# Patient Record
Sex: Female | Born: 1935 | Race: White | Hispanic: No | Smoking: Never smoker
Health system: Southern US, Community
[De-identification: ages and names within clinical notes are randomized; demographics above are authoritative.]

## PROBLEM LIST (undated history)

## (undated) DIAGNOSIS — E785 Hyperlipidemia, unspecified: Secondary | ICD-10-CM

## (undated) DIAGNOSIS — Z9889 Other specified postprocedural states: Secondary | ICD-10-CM

## (undated) DIAGNOSIS — F419 Anxiety disorder, unspecified: Secondary | ICD-10-CM

## (undated) DIAGNOSIS — A048 Other specified bacterial intestinal infections: Secondary | ICD-10-CM

## (undated) DIAGNOSIS — K5790 Diverticulosis of intestine, part unspecified, without perforation or abscess without bleeding: Secondary | ICD-10-CM

## (undated) DIAGNOSIS — I340 Nonrheumatic mitral (valve) insufficiency: Secondary | ICD-10-CM

## (undated) DIAGNOSIS — R413 Other amnesia: Secondary | ICD-10-CM

## (undated) DIAGNOSIS — I10 Essential (primary) hypertension: Secondary | ICD-10-CM

## (undated) DIAGNOSIS — I639 Cerebral infarction, unspecified: Secondary | ICD-10-CM

## (undated) DIAGNOSIS — D696 Thrombocytopenia, unspecified: Secondary | ICD-10-CM

## (undated) DIAGNOSIS — F0390 Unspecified dementia without behavioral disturbance: Secondary | ICD-10-CM

## (undated) DIAGNOSIS — F329 Major depressive disorder, single episode, unspecified: Secondary | ICD-10-CM

## (undated) DIAGNOSIS — R55 Syncope and collapse: Secondary | ICD-10-CM

## (undated) DIAGNOSIS — K219 Gastro-esophageal reflux disease without esophagitis: Secondary | ICD-10-CM

## (undated) DIAGNOSIS — F32A Depression, unspecified: Secondary | ICD-10-CM

## (undated) DIAGNOSIS — E041 Nontoxic single thyroid nodule: Secondary | ICD-10-CM

## (undated) HISTORY — DX: Thrombocytopenia, unspecified: D69.6

## (undated) HISTORY — DX: Diverticulosis of intestine, part unspecified, without perforation or abscess without bleeding: K57.90

## (undated) HISTORY — DX: Syncope and collapse: R55

## (undated) HISTORY — DX: Nonrheumatic mitral (valve) insufficiency: I34.0

## (undated) HISTORY — DX: Other amnesia: R41.3

## (undated) HISTORY — DX: Other specified bacterial intestinal infections: A04.8

## (undated) HISTORY — DX: Gastro-esophageal reflux disease without esophagitis: K21.9

## (undated) HISTORY — DX: Hyperlipidemia, unspecified: E78.5

## (undated) HISTORY — DX: Anxiety disorder, unspecified: F41.9

## (undated) HISTORY — DX: Major depressive disorder, single episode, unspecified: F32.9

## (undated) HISTORY — DX: Essential (primary) hypertension: I10

## (undated) HISTORY — DX: Nontoxic single thyroid nodule: E04.1

## (undated) HISTORY — DX: Cerebral infarction, unspecified: I63.9

## (undated) HISTORY — PX: LASIK: SHX215

## (undated) HISTORY — DX: Depression, unspecified: F32.A

---

## 2006-02-18 ENCOUNTER — Ambulatory Visit: Payer: Self-pay | Admitting: Internal Medicine

## 2006-03-04 ENCOUNTER — Ambulatory Visit: Payer: Self-pay | Admitting: Internal Medicine

## 2006-04-16 ENCOUNTER — Ambulatory Visit: Payer: Self-pay | Admitting: Internal Medicine

## 2006-08-08 ENCOUNTER — Ambulatory Visit: Payer: Self-pay | Admitting: Internal Medicine

## 2007-02-05 ENCOUNTER — Ambulatory Visit: Payer: Self-pay | Admitting: Internal Medicine

## 2007-02-05 DIAGNOSIS — F411 Generalized anxiety disorder: Secondary | ICD-10-CM | POA: Insufficient documentation

## 2007-02-05 DIAGNOSIS — R413 Other amnesia: Secondary | ICD-10-CM | POA: Insufficient documentation

## 2007-02-05 DIAGNOSIS — E785 Hyperlipidemia, unspecified: Secondary | ICD-10-CM | POA: Insufficient documentation

## 2007-02-05 DIAGNOSIS — M810 Age-related osteoporosis without current pathological fracture: Secondary | ICD-10-CM | POA: Insufficient documentation

## 2007-02-05 DIAGNOSIS — I10 Essential (primary) hypertension: Secondary | ICD-10-CM | POA: Insufficient documentation

## 2007-02-05 DIAGNOSIS — F418 Other specified anxiety disorders: Secondary | ICD-10-CM | POA: Insufficient documentation

## 2007-02-05 DIAGNOSIS — K8020 Calculus of gallbladder without cholecystitis without obstruction: Secondary | ICD-10-CM | POA: Insufficient documentation

## 2007-02-05 DIAGNOSIS — Z8679 Personal history of other diseases of the circulatory system: Secondary | ICD-10-CM | POA: Insufficient documentation

## 2007-02-05 DIAGNOSIS — K219 Gastro-esophageal reflux disease without esophagitis: Secondary | ICD-10-CM | POA: Insufficient documentation

## 2007-03-06 ENCOUNTER — Encounter: Payer: Self-pay | Admitting: Internal Medicine

## 2007-05-07 ENCOUNTER — Ambulatory Visit: Payer: Self-pay | Admitting: Internal Medicine

## 2007-05-07 DIAGNOSIS — M79609 Pain in unspecified limb: Secondary | ICD-10-CM | POA: Insufficient documentation

## 2007-05-07 DIAGNOSIS — M109 Gout, unspecified: Secondary | ICD-10-CM | POA: Insufficient documentation

## 2007-07-24 ENCOUNTER — Ambulatory Visit: Payer: Self-pay | Admitting: Internal Medicine

## 2007-08-05 ENCOUNTER — Telehealth: Payer: Self-pay | Admitting: Internal Medicine

## 2007-08-28 ENCOUNTER — Encounter: Payer: Self-pay | Admitting: Internal Medicine

## 2007-09-14 ENCOUNTER — Encounter: Payer: Self-pay | Admitting: Internal Medicine

## 2007-10-22 ENCOUNTER — Ambulatory Visit: Payer: Self-pay | Admitting: Internal Medicine

## 2007-10-22 DIAGNOSIS — R269 Unspecified abnormalities of gait and mobility: Secondary | ICD-10-CM | POA: Insufficient documentation

## 2007-11-08 ENCOUNTER — Encounter: Payer: Self-pay | Admitting: Internal Medicine

## 2008-01-15 HISTORY — PX: CHOLECYSTECTOMY: SHX55

## 2008-02-12 ENCOUNTER — Encounter: Payer: Self-pay | Admitting: Internal Medicine

## 2008-03-03 ENCOUNTER — Ambulatory Visit: Payer: Self-pay | Admitting: Internal Medicine

## 2008-03-03 DIAGNOSIS — E559 Vitamin D deficiency, unspecified: Secondary | ICD-10-CM | POA: Insufficient documentation

## 2008-03-03 LAB — CONVERTED CEMR LAB
Uric Acid, Serum: 8.7 mg/dL — ABNORMAL HIGH (ref 2.4–7.0)
Vit D, 25-Hydroxy: 26 ng/mL — ABNORMAL LOW (ref 30–89)

## 2008-03-07 LAB — CONVERTED CEMR LAB
BUN: 24 mg/dL — ABNORMAL HIGH (ref 6–23)
Basophils Absolute: 0.1 10*3/uL (ref 0.0–0.1)
Basophils Relative: 1 % (ref 0.0–3.0)
Bilirubin Urine: NEGATIVE
CO2: 30 meq/L (ref 19–32)
Calcium: 10 mg/dL (ref 8.4–10.5)
Chloride: 104 meq/L (ref 96–112)
Cholesterol: 160 mg/dL (ref 0–200)
Creatinine, Ser: 1 mg/dL (ref 0.4–1.2)
Crystals: NEGATIVE
Eosinophils Absolute: 0 10*3/uL (ref 0.0–0.7)
Eosinophils Relative: 0.4 % (ref 0.0–5.0)
GFR calc Af Amer: 70 mL/min
GFR calc non Af Amer: 58 mL/min
Glucose, Bld: 130 mg/dL — ABNORMAL HIGH (ref 70–99)
HCT: 42.8 % (ref 36.0–46.0)
HDL: 35.4 mg/dL — ABNORMAL LOW (ref >39.0)
Hemoglobin: 15.1 g/dL — ABNORMAL HIGH (ref 12.0–15.0)
Ketones, ur: NEGATIVE mg/dL
LDL Cholesterol: 100 mg/dL — ABNORMAL HIGH (ref 0–99)
Lymphocytes Relative: 21.6 % (ref 12.0–46.0)
MCHC: 35.2 g/dL (ref 30.0–36.0)
MCV: 91.7 fL (ref 78.0–100.0)
Monocytes Absolute: 0.4 10*3/uL (ref 0.1–1.0)
Monocytes Relative: 4.9 % (ref 3.0–12.0)
Mucus, UA: NEGATIVE
Neutro Abs: 6.2 10*3/uL (ref 1.4–7.7)
Neutrophils Relative %: 72.1 % (ref 43.0–77.0)
Nitrite: NEGATIVE
Platelets: 123 10*3/uL — ABNORMAL LOW (ref 150–400)
Potassium: 3.8 meq/L (ref 3.5–5.1)
RBC: 4.67 M/uL (ref 3.87–5.11)
RDW: 11.7 % (ref 11.5–14.6)
Sed Rate: 12 mm/hr (ref 0–22)
Sodium: 143 meq/L (ref 135–145)
Specific Gravity, Urine: >=1.03 (ref 1.000–1.03)
TSH: 1.85 microintl units/mL (ref 0.35–5.50)
Total CHOL/HDL Ratio: 4.5
Total Protein, Urine: NEGATIVE mg/dL
Triglycerides: 122 mg/dL (ref 0–149)
Urine Glucose: NEGATIVE mg/dL
Urobilinogen, UA: 0.2 (ref 0.0–1.0)
VLDL: 24 mg/dL (ref 0–40)
Vitamin B-12: 469 pg/mL (ref 211–911)
WBC: 8.6 10*3/uL (ref 4.5–10.5)
pH: 5 (ref 5.0–8.0)

## 2008-03-18 ENCOUNTER — Encounter: Payer: Self-pay | Admitting: Internal Medicine

## 2008-07-23 ENCOUNTER — Encounter: Payer: Self-pay | Admitting: Internal Medicine

## 2008-08-01 ENCOUNTER — Ambulatory Visit: Payer: Self-pay | Admitting: Internal Medicine

## 2008-08-01 DIAGNOSIS — R079 Chest pain, unspecified: Secondary | ICD-10-CM | POA: Insufficient documentation

## 2008-09-13 ENCOUNTER — Ambulatory Visit: Payer: Self-pay | Admitting: Internal Medicine

## 2008-09-13 LAB — CONVERTED CEMR LAB
ALT: 55 units/L — ABNORMAL HIGH (ref 0–35)
AST: 35 units/L (ref 0–37)
Albumin: 4.1 g/dL (ref 3.5–5.2)
Alkaline Phosphatase: 71 units/L (ref 39–117)
Basophils Absolute: 0.1 10*3/uL (ref 0.0–0.1)
Basophils Relative: 0.7 % (ref 0.0–3.0)
Bilirubin Urine: NEGATIVE
Bilirubin, Direct: 0.1 mg/dL (ref 0.0–0.3)
Eosinophils Absolute: 0.1 10*3/uL (ref 0.0–0.7)
Eosinophils Relative: 1 % (ref 0.0–5.0)
HCT: 41.1 % (ref 36.0–46.0)
Hemoglobin, Urine: NEGATIVE
Hemoglobin: 14.4 g/dL (ref 12.0–15.0)
Ketones, ur: NEGATIVE mg/dL
Leukocytes, UA: NEGATIVE
Lymphocytes Relative: 23.1 % (ref 12.0–46.0)
Lymphs Abs: 1.7 10*3/uL (ref 0.7–4.0)
MCHC: 35.1 g/dL (ref 30.0–36.0)
MCV: 91.5 fL (ref 78.0–100.0)
Monocytes Absolute: 0.5 10*3/uL (ref 0.1–1.0)
Monocytes Relative: 6.7 % (ref 3.0–12.0)
Neutro Abs: 4.9 10*3/uL (ref 1.4–7.7)
Neutrophils Relative %: 68.5 % (ref 43.0–77.0)
Nitrite: NEGATIVE
Platelets: 120 10*3/uL — ABNORMAL LOW (ref 150.0–400.0)
RBC: 4.49 M/uL (ref 3.87–5.11)
RDW: 12.2 % (ref 11.5–14.6)
Specific Gravity, Urine: 1.025 (ref 1.000–1.030)
Total Bilirubin: 1 mg/dL (ref 0.3–1.2)
Total Protein, Urine: NEGATIVE mg/dL
Total Protein: 7.6 g/dL (ref 6.0–8.3)
Urine Glucose: NEGATIVE mg/dL
Urobilinogen, UA: 0.2 (ref 0.0–1.0)
WBC: 7.3 10*3/uL (ref 4.5–10.5)
pH: 5.5 (ref 5.0–8.0)

## 2008-09-27 ENCOUNTER — Encounter: Payer: Self-pay | Admitting: Internal Medicine

## 2008-10-26 ENCOUNTER — Ambulatory Visit: Payer: Self-pay | Admitting: Gastroenterology

## 2008-10-26 ENCOUNTER — Encounter (INDEPENDENT_AMBULATORY_CARE_PROVIDER_SITE_OTHER): Payer: Self-pay | Admitting: Surgery

## 2008-10-26 ENCOUNTER — Inpatient Hospital Stay (HOSPITAL_COMMUNITY): Admission: AD | Admit: 2008-10-26 | Discharge: 2008-10-30 | Payer: Self-pay | Admitting: Surgery

## 2008-12-06 ENCOUNTER — Encounter: Payer: Self-pay | Admitting: Internal Medicine

## 2008-12-12 ENCOUNTER — Ambulatory Visit: Payer: Self-pay | Admitting: Internal Medicine

## 2008-12-12 LAB — CONVERTED CEMR LAB
ALT: 35 units/L (ref 0–35)
AST: 30 units/L (ref 0–37)
Albumin: 4.1 g/dL (ref 3.5–5.2)
Alkaline Phosphatase: 65 units/L (ref 39–117)
BUN: 16 mg/dL (ref 6–23)
Bilirubin, Direct: 0.2 mg/dL (ref 0.0–0.3)
CO2: 29 meq/L (ref 19–32)
Calcium: 9.6 mg/dL (ref 8.4–10.5)
Chloride: 105 meq/L (ref 96–112)
Cholesterol: 178 mg/dL (ref 0–200)
Creatinine, Ser: 1 mg/dL (ref 0.4–1.2)
GFR calc non Af Amer: 57.87 mL/min (ref >60)
Glucose, Bld: 162 mg/dL — ABNORMAL HIGH (ref 70–99)
HDL: 34.8 mg/dL — ABNORMAL LOW (ref >39.00)
LDL Cholesterol: 114 mg/dL — ABNORMAL HIGH (ref 0–99)
Potassium: 4.2 meq/L (ref 3.5–5.1)
Sodium: 141 meq/L (ref 135–145)
Total Bilirubin: 1 mg/dL (ref 0.3–1.2)
Total CHOL/HDL Ratio: 5
Total Protein: 7.1 g/dL (ref 6.0–8.3)
Triglycerides: 146 mg/dL (ref 0.0–149.0)
Uric Acid, Serum: 8.1 mg/dL — ABNORMAL HIGH (ref 2.4–7.0)
VLDL: 29.2 mg/dL (ref 0.0–40.0)

## 2009-01-14 DIAGNOSIS — A048 Other specified bacterial intestinal infections: Secondary | ICD-10-CM

## 2009-01-14 HISTORY — DX: Other specified bacterial intestinal infections: A04.8

## 2009-03-17 ENCOUNTER — Ambulatory Visit: Payer: Self-pay | Admitting: Internal Medicine

## 2009-03-17 DIAGNOSIS — R209 Unspecified disturbances of skin sensation: Secondary | ICD-10-CM | POA: Insufficient documentation

## 2009-03-17 DIAGNOSIS — A048 Other specified bacterial intestinal infections: Secondary | ICD-10-CM | POA: Insufficient documentation

## 2009-03-17 LAB — CONVERTED CEMR LAB: Vit D, 25-Hydroxy: 27 ng/mL — ABNORMAL LOW (ref 30–89)

## 2009-03-20 LAB — CONVERTED CEMR LAB
ALT: 29 units/L (ref 0–35)
AST: 23 units/L (ref 0–37)
Albumin: 4 g/dL (ref 3.5–5.2)
Alkaline Phosphatase: 58 units/L (ref 39–117)
BUN: 11 mg/dL (ref 6–23)
Basophils Absolute: 0 10*3/uL (ref 0.0–0.1)
Basophils Relative: 0.7 % (ref 0.0–3.0)
Bilirubin Urine: NEGATIVE
Bilirubin, Direct: 0.2 mg/dL (ref 0.0–0.3)
CO2: 30 meq/L (ref 19–32)
Calcium: 9.5 mg/dL (ref 8.4–10.5)
Chloride: 108 meq/L (ref 96–112)
Creatinine, Ser: 0.8 mg/dL (ref 0.4–1.2)
Eosinophils Absolute: 0.1 10*3/uL (ref 0.0–0.7)
Eosinophils Relative: 1.5 % (ref 0.0–5.0)
GFR calc non Af Amer: 74.81 mL/min (ref >60)
Glucose, Bld: 129 mg/dL — ABNORMAL HIGH (ref 70–99)
H Pylori IgG: POSITIVE
HCT: 40.9 % (ref 36.0–46.0)
Hemoglobin: 13.5 g/dL (ref 12.0–15.0)
Ketones, ur: NEGATIVE mg/dL
Lymphocytes Relative: 22 % (ref 12.0–46.0)
Lymphs Abs: 1.2 10*3/uL (ref 0.7–4.0)
MCHC: 33.1 g/dL (ref 30.0–36.0)
MCV: 94.9 fL (ref 78.0–100.0)
Monocytes Absolute: 0.4 10*3/uL (ref 0.1–1.0)
Monocytes Relative: 7.2 % (ref 3.0–12.0)
Neutro Abs: 3.7 10*3/uL (ref 1.4–7.7)
Neutrophils Relative %: 68.6 % (ref 43.0–77.0)
Nitrite: NEGATIVE
Platelets: 94 10*3/uL — ABNORMAL LOW (ref 150.0–400.0)
Potassium: 4.1 meq/L (ref 3.5–5.1)
RBC: 4.31 M/uL (ref 3.87–5.11)
RDW: 12.2 % (ref 11.5–14.6)
Sodium: 142 meq/L (ref 135–145)
Specific Gravity, Urine: 1.025 (ref 1.000–1.030)
TSH: 1.72 microintl units/mL (ref 0.35–5.50)
Total Bilirubin: 0.7 mg/dL (ref 0.3–1.2)
Total Protein, Urine: NEGATIVE mg/dL
Total Protein: 7.2 g/dL (ref 6.0–8.3)
Urine Glucose: NEGATIVE mg/dL
Urobilinogen, UA: 2 (ref 0.0–1.0)
Vitamin B-12: 377 pg/mL (ref 211–911)
WBC: 5.4 10*3/uL (ref 4.5–10.5)
pH: 7 (ref 5.0–8.0)

## 2009-06-26 ENCOUNTER — Ambulatory Visit: Payer: Self-pay | Admitting: Internal Medicine

## 2009-06-27 ENCOUNTER — Ambulatory Visit: Payer: Self-pay | Admitting: Internal Medicine

## 2009-06-28 LAB — CONVERTED CEMR LAB
ALT: 17 units/L (ref 0–35)
AST: 17 units/L (ref 0–37)
Albumin: 4.1 g/dL (ref 3.5–5.2)
Alkaline Phosphatase: 60 units/L (ref 39–117)
BUN: 21 mg/dL (ref 6–23)
Basophils Absolute: 0 10*3/uL (ref 0.0–0.1)
Basophils Relative: 0.6 % (ref 0.0–3.0)
Bilirubin, Direct: 0.1 mg/dL (ref 0.0–0.3)
CO2: 30 meq/L (ref 19–32)
Calcium: 9.4 mg/dL (ref 8.4–10.5)
Chloride: 106 meq/L (ref 96–112)
Creatinine, Ser: 0.9 mg/dL (ref 0.4–1.2)
Eosinophils Absolute: 0.1 10*3/uL (ref 0.0–0.7)
Eosinophils Relative: 0.9 % (ref 0.0–5.0)
GFR calc non Af Amer: 64.42 mL/min (ref >60)
Glucose, Bld: 102 mg/dL — ABNORMAL HIGH (ref 70–99)
HCT: 41.5 % (ref 36.0–46.0)
Hemoglobin: 14.4 g/dL (ref 12.0–15.0)
Hgb A1c MFr Bld: 5.6 % (ref 4.6–6.5)
Lymphocytes Relative: 17.8 % (ref 12.0–46.0)
Lymphs Abs: 1.4 10*3/uL (ref 0.7–4.0)
MCHC: 34.8 g/dL (ref 30.0–36.0)
MCV: 92.4 fL (ref 78.0–100.0)
Monocytes Absolute: 0.4 10*3/uL (ref 0.1–1.0)
Monocytes Relative: 5.4 % (ref 3.0–12.0)
Neutro Abs: 6.1 10*3/uL (ref 1.4–7.7)
Neutrophils Relative %: 75.3 % (ref 43.0–77.0)
Platelets: 105 10*3/uL — ABNORMAL LOW (ref 150.0–400.0)
Potassium: 4.5 meq/L (ref 3.5–5.1)
RBC: 4.49 M/uL (ref 3.87–5.11)
RDW: 12.4 % (ref 11.5–14.6)
Sodium: 143 meq/L (ref 135–145)
TSH: 1.6 microintl units/mL (ref 0.35–5.50)
Total Bilirubin: 0.6 mg/dL (ref 0.3–1.2)
Total Protein: 7.2 g/dL (ref 6.0–8.3)
Vitamin B-12: 299 pg/mL (ref 211–911)
WBC: 8.1 10*3/uL (ref 4.5–10.5)

## 2009-09-27 ENCOUNTER — Ambulatory Visit: Payer: Self-pay | Admitting: Internal Medicine

## 2009-11-14 ENCOUNTER — Encounter (INDEPENDENT_AMBULATORY_CARE_PROVIDER_SITE_OTHER): Payer: Self-pay | Admitting: *Deleted

## 2009-11-17 ENCOUNTER — Encounter (INDEPENDENT_AMBULATORY_CARE_PROVIDER_SITE_OTHER): Payer: Self-pay | Admitting: *Deleted

## 2009-11-30 ENCOUNTER — Encounter (INDEPENDENT_AMBULATORY_CARE_PROVIDER_SITE_OTHER): Payer: Self-pay | Admitting: *Deleted

## 2009-12-01 ENCOUNTER — Ambulatory Visit: Payer: Self-pay | Admitting: Gastroenterology

## 2009-12-11 ENCOUNTER — Ambulatory Visit: Payer: Self-pay | Admitting: Gastroenterology

## 2009-12-11 LAB — HM COLONOSCOPY

## 2009-12-12 ENCOUNTER — Telehealth (INDEPENDENT_AMBULATORY_CARE_PROVIDER_SITE_OTHER): Payer: Self-pay | Admitting: *Deleted

## 2009-12-12 DIAGNOSIS — K649 Unspecified hemorrhoids: Secondary | ICD-10-CM | POA: Insufficient documentation

## 2010-01-02 ENCOUNTER — Encounter: Payer: Self-pay | Admitting: Internal Medicine

## 2010-01-02 ENCOUNTER — Telehealth: Payer: Self-pay | Admitting: Internal Medicine

## 2010-01-02 ENCOUNTER — Ambulatory Visit: Payer: Self-pay | Admitting: Internal Medicine

## 2010-01-02 DIAGNOSIS — D696 Thrombocytopenia, unspecified: Secondary | ICD-10-CM | POA: Insufficient documentation

## 2010-01-04 LAB — CONVERTED CEMR LAB
ALT: 22 units/L (ref 0–35)
AST: 24 units/L (ref 0–37)
Albumin: 4.1 g/dL (ref 3.5–5.2)
Alkaline Phosphatase: 65 units/L (ref 39–117)
BUN: 14 mg/dL (ref 6–23)
Basophils Absolute: 0 10*3/uL (ref 0.0–0.1)
Basophils Relative: 0.7 % (ref 0.0–3.0)
Bilirubin Urine: NEGATIVE
Bilirubin, Direct: 0.1 mg/dL (ref 0.0–0.3)
CO2: 28 meq/L (ref 19–32)
Calcium: 9.3 mg/dL (ref 8.4–10.5)
Chloride: 107 meq/L (ref 96–112)
Cholesterol: 172 mg/dL (ref 0–200)
Creatinine, Ser: 0.9 mg/dL (ref 0.4–1.2)
Eosinophils Absolute: 0 10*3/uL (ref 0.0–0.7)
Eosinophils Relative: 0.6 % (ref 0.0–5.0)
Folate: 9.4 ng/mL
GFR calc non Af Amer: 63.52 mL/min (ref >60.00)
Glucose, Bld: 98 mg/dL (ref 70–99)
HCT: 40.4 % (ref 36.0–46.0)
HDL: 36.9 mg/dL — ABNORMAL LOW (ref >39.00)
Hemoglobin: 14 g/dL (ref 12.0–15.0)
Ketones, ur: NEGATIVE mg/dL
LDL Cholesterol: 109 mg/dL — ABNORMAL HIGH (ref 0–99)
Lymphocytes Relative: 25.1 % (ref 12.0–46.0)
Lymphs Abs: 1.7 10*3/uL (ref 0.7–4.0)
MCHC: 34.7 g/dL (ref 30.0–36.0)
MCV: 92.1 fL (ref 78.0–100.0)
Monocytes Absolute: 0.4 10*3/uL (ref 0.1–1.0)
Monocytes Relative: 5.3 % (ref 3.0–12.0)
Neutro Abs: 4.6 10*3/uL (ref 1.4–7.7)
Neutrophils Relative %: 68.3 % (ref 43.0–77.0)
Nitrite: NEGATIVE
Platelets: 44 10*3/uL — CL (ref 150.0–400.0)
Potassium: 4.2 meq/L (ref 3.5–5.1)
RBC: 4.39 M/uL (ref 3.87–5.11)
RDW: 12.3 % (ref 11.5–14.6)
Sodium: 142 meq/L (ref 135–145)
Specific Gravity, Urine: 1.01 (ref 1.000–1.030)
TSH: 1.88 microintl units/mL (ref 0.35–5.50)
Total Bilirubin: 0.7 mg/dL (ref 0.3–1.2)
Total CHOL/HDL Ratio: 5
Total Protein, Urine: NEGATIVE mg/dL
Total Protein: 7 g/dL (ref 6.0–8.3)
Triglycerides: 131 mg/dL (ref 0.0–149.0)
Urine Glucose: NEGATIVE mg/dL
Urobilinogen, UA: 0.2 (ref 0.0–1.0)
VLDL: 26.2 mg/dL (ref 0.0–40.0)
Vitamin B-12: 362 pg/mL (ref 211–911)
WBC: 6.7 10*3/uL (ref 4.5–10.5)
pH: 6 (ref 5.0–8.0)

## 2010-01-05 LAB — CONVERTED CEMR LAB: Vit D, 25-Hydroxy: 29 ng/mL — ABNORMAL LOW (ref 30–89)

## 2010-02-13 NOTE — Letter (Signed)
Summary: [facility] Instructions  [facility]  [address] [place], [place] [address]   Phone: [phone]  Fax: [phone]       Alice Wong    July 24, 1934    MRN: [id number]        Procedure Day Dorna Bloom:  Duanne Limerick  12/11/09     Arrival Time:  8:30AM     Procedure Time:  9:30AM     Location of Procedure:                    _ X_  [facility] (4th Floor)   PREPARATION FOR COLONOSCOPY WITH MOVIPREP   Starting 5 days prior to your procedure 12/06/09 do not eat nuts, seeds, popcorn, corn, beans, peas,  salads, or any raw vegetables.  Do not take any fiber supplements (e.g. Metamucil, Citrucel, and Benefiber).  THE DAY BEFORE YOUR PROCEDURE         DATE: 12/10/09   DAY: SUNDAY  1.  Drink clear liquids the entire day-NO SOLID FOOD  2.  Do not drink anything colored red or purple.  Avoid juices with pulp.  No orange juice.  3.  Drink at least 64 oz. (8 glasses) of fluid/clear liquids during the day to prevent dehydration and help the prep work efficiently.  CLEAR LIQUIDS INCLUDE: Water Jello Ice Popsicles Tea (sugar ok, no milk/cream) Powdered fruit flavored drinks Coffee (sugar ok, no milk/cream) Gatorade Juice: apple, white grape, white cranberry  Lemonade Clear bullion, consomm, broth Carbonated beverages (any kind) Strained chicken noodle soup Hard Candy                             4.  In the morning, mix first dose of MoviPrep solution:    Empty 1 Pouch A and 1 Pouch B into the disposable container    Add lukewarm drinking water to the top line of the container. Mix to dissolve    Refrigerate (mixed solution should be used within 24 hrs)  5.  Begin drinking the prep at 5:00 p.m. The MoviPrep container is divided by 4 marks.   Every 15 minutes drink the solution down to the next mark (approximately 8 oz) until the full liter is complete.   6.  Follow completed prep with 16 oz of clear liquid of your choice (Nothing red or purple).   Continue to drink clear liquids until bedtime.  7.  Before going to bed, mix second dose of MoviPrep solution:    Empty 1 Pouch A and 1 Pouch B into the disposable container    Add lukewarm drinking water to the top line of the container. Mix to dissolve    Refrigerate  THE DAY OF YOUR PROCEDURE      DATE: 12/11/09  DAY: MONDAY  Beginning at 4:30AM (5 hours before procedure):         1. Every 15 minutes, drink the solution down to the next mark (approx 8 oz) until the full liter is complete.  2. Follow completed prep with 16 oz. of clear liquid of your choice.    3. You may drink clear liquids until 7:30AM (2 HOURS BEFORE PROCEDURE).   MEDICATION INSTRUCTIONS  Unless otherwise instructed, you should take regular prescription medications with a small sip of water   as early as possible the morning of your procedure.   Additional medication instructions: Hold Furosemide the morning of procedure.  OTHER INSTRUCTIONS  You will need a responsible adult at least 74 years of age to accompany you and drive you home.   This person must remain in the waiting room during your procedure.  Wear loose fitting clothing that is easily removed.  Leave jewelry and other valuables at home.  However, you may wish to bring a book to read or  an iPod/MP3 player to listen to music as you wait for your procedure to start.  Remove all body piercing jewelry and leave at home.  Total time from sign-in until discharge is approximately 2-3 hours.  You should go home directly after your procedure and rest.  You can resume normal activities the  day after your procedure.  The day of your procedure you should not:   Drive   Make legal decisions   Operate machinery   Drink alcohol   Return to work  You will receive specific instructions about eating, activities and medications before you leave.    The above instructions have been reviewed and explained to me by   Wyona Almas RN  December 01, 2009 9:17 AM     I fully understand and can verbalize these instructions _____________________________ Date _________

## 2010-02-13 NOTE — Assessment & Plan Note (Signed)
Summary: 3 MO ROV /NWS #   Vital Signs:  Patient profile:   76 year old female Weight:      174 pounds O2 Sat:      99 % Temp:     97.8 degrees F oral Pulse rate:   57 / minute BP sitting:   160 / 80  (left arm)  Vitals Entered By: Tora Perches (March 17, 2009 7:55 AM) CC: f/u Is Patient Diabetic? No   CC:  f/u.  History of Present Illness: The patient presents for a follow up of hypertension, OA, gout, GS   Preventive Screening-Counseling & Management  Alcohol-Tobacco     Smoking Status: never  Current Medications (verified): 1)  Fluoxetine Hcl 20 Mg  Tabs (Fluoxetine Hcl) .... Take 2 Tab By Mouth Daily 2)  Enalapril Maleate 20 Mg  Tabs (Enalapril Maleate) .... Take 1 Tab By Mouth Daily 3)  Coreg 12.5 Mg  Tabs (Carvedilol) .... Take 1 Tab By Mouth Twice A Day 4)  Aspir-Low 81 Mg Tbec (Aspirin) .Marland Kitchen.. 1 Bid  After Meal 5)  Vitamin D3 1000 Unit  Tabs (Cholecalciferol) .Marland Kitchen.. 1 Qd 6)  Furosemide 20 Mg Tabs (Furosemide) .... Take 1 Tab By Mouth Every Day Prn 7)  Potassium Chloride Cr 10 Meq  Tbcr (Potassium Chloride) .Marland Kitchen.. 1 By Mouth Once Daily Prn 8)  Balanced B-100   Tabs (B Complex Vitamins) .Marland Kitchen.. 1 By Mouth Qd 9)  Ativan 2 Mg  Tabs (Lorazepam) .Marland Kitchen.. 1 By Mouth Tid 10)  Nitroglycerin 0.4 Mg Subl (Nitroglycerin) .Marland Kitchen.. 1 Sl Prn 11)  Indapamide 1.25 Mg Tabs (Indapamide) .Marland Kitchen.. 1 By Mouth Qam  Allergies: 1)  Morphine Sulfate (Morphine Sulfate)  Past History:  Past Surgical History: Last updated: 12/12/2008 Cholecystectomy 2010  Social History: Last updated: 02/05/2007 Retired Married Never Smoked  Past Medical History: Anxiety Depression GERD Hyperlipidemia Hypertension Osteoporosis Cerebrovascular accident, hx of Thyroid cysts Cholelithiasis MR, mild Memory loss Gout 2009 Vit D def Vaso-vagal syncope/spells triggered by GS H pilori (+) 2011  Family History: Reviewed history from 02/05/2007 and no changes required. Family History Hypertension  Social  History: Reviewed history from 02/05/2007 and no changes required. Retired Married Never Smoked  Review of Systems  The patient denies fever, chest pain, syncope, dyspnea on exertion, and abdominal pain.    Physical Exam  General:  Well-developed,well-nourished,in no acute distress; alert,appropriate and cooperative throughout examination Nose:  External nasal examination shows no deformity or inflammation. Nasal mucosa are pink and moist without lesions or exudates. Mouth:  Oral mucosa and oropharynx without lesions or exudates.  Teeth in good repair. Neck:  No deformities, masses, or tenderness noted. Lungs:  Normal respiratory effort, chest expands symmetrically. Lungs are clear to auscultation, no crackles or wheezes. Heart:  Normal rate and regular rhythm. S1 and S2 normal without gallop, murmur, click, rub or other extra sounds. Abdomen:  Bowel sounds positive,abdomen soft and non-tender without masses, organomegaly or hernias noted. Msk:  No deformity or scoliosis noted of thoracic or lumbar spine.   Neurologic:  No cranial nerve deficits noted. Station and gait are normal. Plantar reflexes are down-going bilaterally. DTRs are symmetrical throughout. Sensory, motor and coordinative functions appear intact. Skin:  Intact without suspicious lesions or rashes Psych:  Oriented, normally interactive, good eye contact, not agitated, and slightly anxious.     Impression & Recommendations:  Problem # 1:  HYPERTENSION (ICD-401.9) Assessment Deteriorated  The following medications were removed from the medication list:    Coreg 12.5  Mg Tabs (Carvedilol) .Marland Kitchen... Take 1 tab by mouth twice a day    Indapamide 1.25 Mg Tabs (Indapamide) .Marland Kitchen... 1 by mouth qam Her updated medication list for this problem includes:    Enalapril Maleate 20 Mg Tabs (Enalapril maleate) .Marland Kitchen... Take 1 tab by mouth daily    Furosemide 20 Mg Tabs (Furosemide) .Marland Kitchen... Take 1 tab by mouth every day prn    Coreg 25 Mg Tabs  (Carvedilol) .Marland Kitchen... 1 by mouth two times a day for blood pressure  Orders: TLB-BMP (Basic Metabolic Panel-BMET) (80048-METABOL) TLB-CBC Platelet - w/Differential (85025-CBCD) TLB-Hepatic/Liver Function Pnl (80076-HEPATIC) TLB-TSH (Thyroid Stimulating Hormone) (84443-TSH) T-Vitamin D (25-Hydroxy) ([address])  Problem # 2:  OSTEOPOROSIS (ICD-733.00) Assessment: Unchanged Vit D  Problem # 3:  ANXIETY (ICD-300.00) Assessment: Unchanged  Her updated medication list for this problem includes:    Fluoxetine Hcl 20 Mg Tabs (Fluoxetine hcl) .Marland Kitchen... Take 2 tab by mouth daily    Ativan 2 Mg Tabs (Lorazepam) .Marland Kitchen... 1 by mouth tid  Problem # 4:  GOUT (ICD-274.9) Assessment: Improved  Problem # 5:  S/p chole Assessment: Improved  Problem # 6:  HELICOBACTER PYLORI INFECTION (ICD-041.86) Assessment: New Prevpac x 10 d   Complete Medication List: 1)  Fluoxetine Hcl 20 Mg Tabs (Fluoxetine hcl) .... Take 2 tab by mouth daily 2)  Enalapril Maleate 20 Mg Tabs (Enalapril maleate) .... Take 1 tab by mouth daily 3)  Furosemide 20 Mg Tabs (Furosemide) .... Take 1 tab by mouth every day prn 4)  Potassium Chloride Cr 10 Meq Tbcr (Potassium chloride) .Marland Kitchen.. 1 by mouth once daily prn 5)  Balanced B-100 Tabs (B complex vitamins) .Marland Kitchen.. 1 by mouth qd 6)  Ativan 2 Mg Tabs (Lorazepam) .Marland Kitchen.. 1 by mouth tid 7)  Nitroglycerin 0.4 Mg Subl (Nitroglycerin) .Marland Kitchen.. 1 sl prn 8)  Coreg 25 Mg Tabs (Carvedilol) .Marland Kitchen.. 1 by mouth two times a day for blood pressure 9)  Ranitidine Hcl 300 Mg Tabs (Ranitidine hcl) .Marland Kitchen.. 1 by mouth once daily for indigestion 10)  Vitamin D3 1000 Unit Tabs (Cholecalciferol) .... 2 once daily by mouth 11)  Aspir-low 81 Mg Tbec (Aspirin) .Marland Kitchen.. 1 bid  after meal 12)  Prevpac Misc (Amoxicill-clarithro-lansopraz) .... As dirrected  Other Orders: TLB-H. Pylori Abs(Helicobacter Pylori) (86677-HELICO) TLB-B12, Serum-Total ONLY (09811-B14) TLB-Udip ONLY (81003-UDIP) Pneumococcal Vaccine (78295) Admin 1st  Vaccine (62130) Admin 1st Vaccine [facility]) [phone])  Patient Instructions: 1)  Please schedule a follow-up appointment in 3 months. Prescriptions: PREVPAC  MISC (AMOXICILL-CLARITHRO-LANSOPRAZ) as dirrected  #10 x 0   Entered and Authorized by:   Tresa Garter MD   Signed by:   Tresa Garter MD on 03/20/2009   Method used:   Print then Give to Patient   RxID:   [id number] NITROGLYCERIN 0.4 MG SUBL (NITROGLYCERIN) 1 sl prn  #25 x 3   Entered and Authorized by:   Tresa Garter MD   Signed by:   Tresa Garter MD on 03/17/2009   Method used:   Electronically to        [facility] [phone]* (retail)       [address] [place], [place]  [address]       Ph: [phone]       Fax: [phone]   RxID:   [id number] POTASSIUM CHLORIDE CR 10 MEQ  TBCR (POTASSIUM CHLORIDE) 1 by mouth once daily prn  #90 x 1   Entered and Authorized by:   Macarthur Critchley  Buckner Malta MD   Signed by:   Tresa Garter MD on 03/17/2009   Method used:   Electronically to        [facility] [phone]* (retail)       [address] [place], [place]  [address]       Ph: [phone]       Fax: [phone]   RxID:   [id number] FUROSEMIDE 20 MG TABS (FUROSEMIDE) Take 1 tab by mouth every day prn  #90 x 1   Entered and Authorized by:   Tresa Garter MD   Signed by:   Tresa Garter MD on 03/17/2009   Method used:   Electronically to        [facility] [phone]* (retail)       [address] [place], [place]  [address]       Ph: [phone]       Fax: [phone]   RxID:   [id number] ENALAPRIL MALEATE 20 MG  TABS (ENALAPRIL MALEATE) Take 1 tab by mouth daily  #90 x 3   Entered and Authorized by:   Tresa Garter MD   Signed by:   Tresa Garter MD on 03/17/2009   Method used:   Electronically to        [facility] [phone]* (retail)       [address] [place], [place]  [address]       Ph: [phone]       Fax: [phone]    RxID:   [id number] FLUOXETINE HCL 20 MG  TABS (FLUOXETINE HCL) Take 2 tab by mouth daily  #180 x 3   Entered and Authorized by:   Tresa Garter MD   Signed by:   Tresa Garter MD on 03/17/2009   Method used:   Electronically to        [facility] [phone]* (retail)       [address] [place], [place]  [address]       Ph: [phone]       Fax: [phone]   RxID:   [id number] RANITIDINE HCL 300 MG TABS (RANITIDINE HCL) 1 by mouth once daily for indigestion  #90 x 3   Entered and Authorized by:   Tresa Garter MD   Signed by:   Tresa Garter MD on 03/17/2009   Method used:   Electronically to        [facility] [phone]* (retail)       [address] [place], [place]  [address]       Ph: [phone]       Fax: [phone]   RxID:   [id number] COREG 25 MG TABS (CARVEDILOL) 1 by mouth two times a day for blood pressure  #180 x 3   Entered and Authorized by:   Tresa Garter MD   Signed by:   Tresa Garter MD on 03/17/2009   Method used:   Electronically to        [facility] [phone]* (retail)       [address] [place], [place]  [address]       Ph: [phone]       Fax: [phone]  RxID:   [id number]    Pneumovax Vaccine    Vaccine Type: Pneumovax    Site: left deltoid    Mfr: Merck    Dose: 0.5 ml    Route: IM    Given by: Tora Perches    Exp. Date: 08/28/2010    Lot #: [id number]    VIS given: 08/12/95 version given March 17, 2009.

## 2010-02-13 NOTE — Assessment & Plan Note (Signed)
Summary: 3 mos f/u $50 cd   Vital Signs:  Patient profile:   76 year old female Height:      64 inches Weight:      174 pounds BMI:     29.97 Temp:     97.8 degrees F oral Pulse rate:   61 / minute BP sitting:   132 / 84  (left arm)  Vitals Entered By: Tora Perches (August 01, 2008 10:23 AM) CC: f/u Is Patient Diabetic? No   CC:  f/u.  History of Present Illness: The patient presents for a post-hospital visit for CP, HTN. Had a stress test.    Current Medications (verified): 1)  Fluoxetine Hcl 20 Mg  Tabs (Fluoxetine Hcl) .... Take 2 Tab By Mouth Daily 2)  Enalapril Maleate 20 Mg  Tabs (Enalapril Maleate) .... Take 1 Tab By Mouth Daily 3)  Coreg 12.5 Mg  Tabs (Carvedilol) .... Take 1 Tab By Mouth Twice A Day 4)  Ranitidine Hcl 150 Mg Caps (Ranitidine Hcl) .Marland Kitchen.. 1 Po Bid 5)  Aspir-Low 81 Mg Tbec (Aspirin) .Marland Kitchen.. 1 Bid  After Meal 6)  Vitamin D3 1000 Unit  Tabs (Cholecalciferol) .Marland Kitchen.. 1 Qd 7)  Furosemide 20 Mg Tabs (Furosemide) .... Take 1 Tab By Mouth Every Day 8)  Potassium Chloride Cr 10 Meq  Tbcr (Potassium Chloride) .Marland Kitchen.. 1 By Mouth Qd 9)  Ibuprofen 600 Mg  Tabs (Ibuprofen) .Marland Kitchen.. 1 By Mouth Two Times A Day Pc X 5 D, Then As Needed Gout 10)  Balanced B-100   Tabs (B Complex Vitamins) .Marland Kitchen.. 1 By Mouth Qd 11)  Ativan 2 Mg  Tabs (Lorazepam) .Marland Kitchen.. 1 By Mouth Tid 12)  Hydrocodone-Acetaminophen 5-325 Mg  Tabs (Hydrocodone-Acetaminophen) .Marland Kitchen.. 1 By Mouth Qid As Needed Pain 13)  Nitroglycerin 0.4 Mg Subl (Nitroglycerin) .Marland Kitchen.. 1 Sl Prn 14)  Vitamin D 95284 Unit  Caps (Ergocalciferol) .Marland Kitchen.. 1 By Mouth Weekly 15)  Allopurinol 100 Mg Tabs (Allopurinol) .Marland Kitchen.. 1 By Mouth Daily 16)  Indapamide 1.25 Mg Tabs (Indapamide) .Marland Kitchen.. 1 By Mouth Qam  Allergies (verified): No Known Drug Allergies  Past History:  Past Medical History: Last updated: 03/03/2008 Anxiety Depression GERD Hyperlipidemia Hypertension Osteoporosis Cerebrovascular accident, hx of Thyroid cysts Cholelithiasis MR,  mild Memory loss Gout 2009 Vit D def  Family History: Last updated: 02/05/2007 Family History Hypertension  Social History: Last updated: 02/05/2007 Retired Married Never Smoked  Physical Exam  General:  Well-developed,well-nourished,in no acute distress; alert,appropriate and cooperative throughout examination Nose:  External nasal examination shows no deformity or inflammation. Nasal mucosa are pink and moist without lesions or exudates. Mouth:  Oral mucosa and oropharynx without lesions or exudates.  Teeth in good repair. Neck:  No deformities, masses, or tenderness noted. Lungs:  Normal respiratory effort, chest expands symmetrically. Lungs are clear to auscultation, no crackles or wheezes. Heart:  Normal rate and regular rhythm. S1 and S2 normal without gallop, murmur, click, rub or other extra sounds. Abdomen:  Bowel sounds positive,abdomen soft and non-tender without masses, organomegaly or hernias noted. Msk:  No deformity or scoliosis noted of thoracic or lumbar spine.   Pulses:  R and L carotid,radial,femoral,dorsalis pedis and posterior tibial pulses are full and equal bilaterally Extremities:  trace left pedal edema and trace right pedal edema.   Neurologic:  No cranial nerve deficits noted. Station and gait are normal. Plantar reflexes are down-going bilaterally. DTRs are symmetrical throughout. Sensory, motor and coordinative functions appear intact. Skin:  Intact without suspicious lesions or rashes Psych:  Cognition and judgment appear intact. Alert and cooperative with normal attention span and concentration. No apparent delusions, illusions, hallucinationsnormally interactive, depressed affect, and tearful.     Impression & Recommendations:  Problem # 1:  CHEST PAIN, UNSPECIFIED (ICD-786.50) Assessment Comment Only We are waiting on d/c summary from [facility]. No CP now  Problem # 2:  VITAMIN D DEFICIENCY (ICD-268.9) Assessment: Comment Only On prescription  therapy   Problem # 3:  MEMORY LOSS (ICD-780.93) Assessment: Unchanged  Problem # 4:  DEPRESSION (ICD-311) Assessment: Comment Only  Her updated medication list for this problem includes:    Fluoxetine Hcl 20 Mg Tabs (Fluoxetine hcl) .Marland Kitchen... Take 2 tab by mouth daily    Ativan 2 Mg Tabs (Lorazepam) .Marland Kitchen... 1 by mouth tid  Complete Medication List: 1)  Fluoxetine Hcl 20 Mg Tabs (Fluoxetine hcl) .... Take 2 tab by mouth daily 2)  Enalapril Maleate 20 Mg Tabs (Enalapril maleate) .... Take 1 tab by mouth daily 3)  Coreg 12.5 Mg Tabs (Carvedilol) .... Take 1 tab by mouth twice a day 4)  Ranitidine Hcl 150 Mg Caps (Ranitidine hcl) .Marland Kitchen.. 1 po bid 5)  Aspir-low 81 Mg Tbec (Aspirin) .Marland Kitchen.. 1 bid  after meal 6)  Vitamin D3 1000 Unit Tabs (Cholecalciferol) .Marland Kitchen.. 1 qd 7)  Furosemide 20 Mg Tabs (Furosemide) .... Take 1 tab by mouth every day 8)  Potassium Chloride Cr 10 Meq Tbcr (Potassium chloride) .Marland Kitchen.. 1 by mouth qd 9)  Ibuprofen 600 Mg Tabs (Ibuprofen) .Marland Kitchen.. 1 by mouth two times a day pc x 5 d, then as needed gout 10)  Balanced B-100 Tabs (B complex vitamins) .Marland Kitchen.. 1 by mouth qd 11)  Ativan 2 Mg Tabs (Lorazepam) .Marland Kitchen.. 1 by mouth tid 12)  Nitroglycerin 0.4 Mg Subl (Nitroglycerin) .Marland Kitchen.. 1 sl prn 13)  Indapamide 1.25 Mg Tabs (Indapamide) .Marland Kitchen.. 1 by mouth qam  Patient Instructions: 1)  Try to eat more raw plant food, fresh and dry fruit, raw almonds, leafy vegies, whole foods and less red meat, less animal fat. Avoid processed foods (canned soups, hot dogs, sausage , frozen dinners). Avoid corn syrup or aspartam and Splenda  containing drinks. Make your own salad dressing with olive oil, wine vinegar, garlic etc. 2)  Please schedule a follow-up appointment in 3 months. 3)  BMP prior to visit, ICD-9: 4)  Hepatic Panel prior to visit, ICD-9: 5)  Lipid Panel prior to visit, ICD-9:401.1 6)  TSH prior to visit, ICD-9: 7)  CBC w/ Diff prior to visit, ICD-9: 8)  HbgA1C prior to visit, ICD-9: 401.1 995.20 9)  Vit  B12 10)  Vit D 266.2  268.9 995.20

## 2010-02-13 NOTE — Letter (Signed)
Summary: Med Cert Disability Exception/Dept of [employer]  Med Cert Disability Exception/[employer]   Imported By: Lester Elgin 12/05/2007 11:25:00  _____________________________________________________________________  External Attachment:    Type:   Image     Comment:   External Document

## 2010-02-13 NOTE — Consult Note (Signed)
Summary: Gallbladder/[facility]  Gallbladder/[facility]   Imported By: Lester Kershaw 10/06/2008 11:30:40  _____________________________________________________________________  External Attachment:    Type:   Image     Comment:   External Document

## 2010-02-13 NOTE — Progress Notes (Signed)
Summary: Hemorrhoids  Phone Note Call from Patient   Summary of Call: Pt's daughter called. Hemorrhoid suppositories have not helped. She would like referral to GI to remove these.  Initial call taken by: Lamar Sprinkles, CMA,  December 12, 2009 2:26 PM  Follow-up for Phone Call        She will have to see a surgeon for that. Follow-up by: Tresa Garter MD,  December 12, 2009 6:08 PM  New Problems: HEMORRHOIDS (ICD-455.6)   New Problems: HEMORRHOIDS (ICD-455.6)

## 2010-02-13 NOTE — Letter (Signed)
Summary: Quite anxious w crying spells/[facility]  [facility]  Quite anxious w crying spells/[facility]  [facility]   Imported By: Lester Heber-Overgaard 02/22/2008 10:05:12  _____________________________________________________________________  External Attachment:    Type:   Image     Comment:   External Document

## 2010-02-13 NOTE — Procedures (Signed)
Summary: Colonoscopy  Patient: [name] Note: All result statuses are Final unless otherwise noted.  Tests: (1) Colonoscopy (COL)   COL Colonoscopy           DONE     [facility]     [address]     [place], [place]  [address]           COLONOSCOPY PROCEDURE REPORT           PATIENT:  Alice Wong, Alice Wong  MR#:  [id number]     BIRTHDATE:  23-Jan-1934, 75 yrs. old  GENDER:  female           ENDOSCOPIST:  Barbette Hair. Arlyce Dice, MD     Referred by:  Linda Hedges Plotnikov, M.D.           PROCEDURE DATE:  12/11/2009     PROCEDURE:  Diagnostic Colonoscopy     ASA CLASS:  Class II     INDICATIONS:  1) Routine Risk Screening           MEDICATIONS:   Fentanyl 50 mcg IV, Versed 5 mg IV           DESCRIPTION OF PROCEDURE:   After the risks benefits and     alternatives of the procedure were thoroughly explained, informed     consent was obtained.  Digital rectal exam was performed and     revealed no abnormalities.   The LB 180AL [id number] endoscope was     introduced through the anus and advanced to the cecum, which was     identified by the ileocecal valve, without limitations.  The     quality of the prep was good, using MoviPrep.  The instrument was     then slowly withdrawn as the colon was fully examined.     <<PROCEDUREIMAGES>>           FINDINGS:  Mild diverticulosis was found in the sigmoid to     descending colon segments (see image1).  A lipoma was found in the     proximal transverse colon (see image8).  This was otherwise a     normal examination of the colon (see image3, image4, image5,     image9, image12, and image13).   Retroflexed views in the rectum     revealed no abnormalities.    The time to cecum =  3.25  minutes.     The scope was then withdrawn (time =  9.50  min) from the patient     and the procedure completed.           COMPLICATIONS:  None           ENDOSCOPIC IMPRESSION:     1) Mild diverticulosis in the sigmoid to descending colon     segments  2) Lipoma in the proximal transverse colon     3) Otherwise normal examination     RECOMMENDATIONS:     1) Continue current colorectal screening recommendations for     "routine risk" patients with a repeat colonoscopy in 10 years.           REPEAT EXAM:   10 year(s) Colonoscopy           ______________________________     Barbette Hair. Arlyce Dice, MD           CC:           n.     eSIGNED:   Barbette Hair. Kaplan at 12/11/2009 10:18 AM  Elaijah, Munoz, [id number]  Note: An exclamation mark (!) indicates a result that was not dispersed into the flowsheet. Document Creation Date: 12/11/2009 10:18 AM _______________________________________________________________________  (1) Order result status: Final Collection or observation date-time: 12/11/2009 10:09 Requested date-time:  Receipt date-time:  Reported date-time:  Referring Physician:   Ordering Physician: Melvia Heaps [phone]) Specimen Source:  Source: Launa Grill Order Number: [id number] Lab site:   Appended Document: Colonoscopy    Clinical Lists Changes  Observations: Added new observation of COLONNXTDUE: 11/2019 (12/11/2009 10:50)

## 2010-02-13 NOTE — Assessment & Plan Note (Signed)
Summary: 3 mth fu  stc   Vital Signs:  Patient profile:   76 year old female Height:      64 inches Weight:      166 pounds BMI:     28.60 Temp:     97.2 degrees F oral Pulse rate:   76 / minute Pulse rhythm:   regular Resp:     16 per minute BP sitting:   170 / 100  (left arm) Cuff size:   regular  Vitals Entered By: Lanier Prude, Beverly Gust) (September 27, 2009 7:54 AM) CC: f/u  c/o Rt ear pain Is Patient Diabetic? No Comments pt is not taking Furosemide, Ativan, Ranitidine or Cozaar   CC:  f/u  c/o Rt ear pain.  History of Present Illness: C/o hearing loss F/u HTN, CVA, depression She is not taking BP meds because it is low at times. "I take chocolate for BP"...  Current Medications (verified): 1)  Fluoxetine Hcl 20 Mg  Tabs (Fluoxetine Hcl) .... Take 2 Tab By Mouth Daily 2)  Furosemide 20 Mg Tabs (Furosemide) .... Take 1 Tab By Mouth Every Day Prn 3)  Potassium Chloride Cr 10 Meq  Tbcr (Potassium Chloride) .Marland Kitchen.. 1 By Mouth Once Daily Prn 4)  Balanced B-100   Tabs (B Complex Vitamins) .Marland Kitchen.. 1 By Mouth Qd 5)  Ativan 2 Mg  Tabs (Lorazepam) .Marland Kitchen.. 1 By Mouth Tid 6)  Nitroglycerin 0.4 Mg Subl (Nitroglycerin) .Marland Kitchen.. 1 Sl Prn 7)  Coreg 25 Mg Tabs (Carvedilol) .Marland Kitchen.. 1 By Mouth Two Times A Day For Blood Pressure 8)  Ranitidine Hcl 300 Mg Tabs (Ranitidine Hcl) .Marland Kitchen.. 1 By Mouth Once Daily For Indigestion 9)  Vitamin D3 1000 Unit  Tabs (Cholecalciferol) .... 2 Once Daily By Mouth 10)  Aspir-Low 81 Mg Tbec (Aspirin) .Marland Kitchen.. 1 Bid  After Meal 11)  Cozaar 100 Mg Tabs (Losartan Potassium) .Marland Kitchen.. 1 By Mouth Qd  Allergies (verified): 1)  Morphine Sulfate (Morphine Sulfate)  Past History:  Past Medical History: Last updated: 03/17/2009 Anxiety Depression GERD Hyperlipidemia Hypertension Osteoporosis Cerebrovascular accident, hx of Thyroid cysts Cholelithiasis MR, mild Memory loss Gout 2009 Vit D def Vaso-vagal syncope/spells triggered by GS H pilori (+) 2011  Past Surgical  History: Last updated: 12/12/2008 Cholecystectomy 2010  Social History: Last updated: 02/05/2007 Retired Married Never Smoked  Physical Exam  General:  Well-developed,well-nourished,in no acute distress; alert,appropriate and cooperative throughout examination Ears:  wax B Nose:  External nasal examination shows no deformity or inflammation. Nasal mucosa are pink and moist without lesions or exudates. Mouth:  Oral mucosa and oropharynx without lesions or exudates.  Teeth in good repair. Neck:  No deformities, masses, or tenderness noted. Lungs:  Normal respiratory effort, chest expands symmetrically. Lungs are clear to auscultation, no crackles or wheezes. Heart:  Normal rate and regular rhythm. S1 and S2 normal without gallop, murmur, click, rub or other extra sounds. Abdomen:  Bowel sounds positive,abdomen soft and non-tender without masses, organomegaly or hernias noted. Msk:  No deformity or scoliosis noted of thoracic or lumbar spine.   Neurologic:  No cranial nerve deficits noted. Station and gait are normal. Plantar reflexes are down-going bilaterally. DTRs are symmetrical throughout. Sensory, motor and coordinative functions appear intact. Skin:  Intact without suspicious lesions or rashes AK x 2 on nose 1 on lip and 2 on cheeks Psych:  Oriented, normally interactive, good eye contact, not agitated, and slightly anxious.     Impression & Recommendations:  Problem # 1:  CEREBROVASCULAR ACCIDENT, HX  OF (ICD-V12.50) Assessment Unchanged On the regimen of medicine(s) reflected in the chart   Risks of noncompliance with treatment discussed. Compliance encouraged.   Problem # 2:  HYPERTENSION (ICD-401.9) Assessment: Deteriorated  Risks of noncompliance with treatment discussed. Compliance encouraged.  Her updated medication list for this problem includes:    Furosemide 20 Mg Tabs (Furosemide) .Marland Kitchen... Take 1 tab by mouth every day prn    Coreg 25 Mg Tabs (Carvedilol) .Marland Kitchen... 1 by  mouth two times a day for blood pressure    Cozaar 100 Mg Tabs (Losartan potassium) .Marland Kitchen... 1 by mouth qd  BP today: 170/100 Prior BP: 150/98 (06/26/2009)  Labs Reviewed: K+: 4.5 (06/27/2009) Creat: : 0.9 (06/27/2009)   Chol: 178 (12/12/2008)   HDL: 34.80 (12/12/2008)   LDL: 114 (12/12/2008)   TG: 146.0 (12/12/2008)  Problem # 3:  DEPRESSION (ICD-311) Assessment: Unchanged  Her updated medication list for this problem includes:    Fluoxetine Hcl 20 Mg Tabs (Fluoxetine hcl) .Marland Kitchen... Take 2 tab by mouth daily    Ativan 2 Mg Tabs (Lorazepam) .Marland Kitchen... 1 by mouth tid  Problem # 4:  HYPERLIPIDEMIA (ICD-272.4)  Problem # 5:  ANXIETY (ICD-300.00) Assessment: Improved  Her updated medication list for this problem includes:    Fluoxetine Hcl 20 Mg Tabs (Fluoxetine hcl) .Marland Kitchen... Take 2 tab by mouth daily    Ativan 2 Mg Tabs (Lorazepam) .Marland Kitchen... 1 by mouth tid  Problem # 6:  GERD (ICD-530.81) Assessment: Improved  Her updated medication list for this problem includes:    Ranitidine Hcl 300 Mg Tabs (Ranitidine hcl) .Marland Kitchen... 1 by mouth once daily for indigestion  Complete Medication List: 1)  Fluoxetine Hcl 20 Mg Tabs (Fluoxetine hcl) .... Take 2 tab by mouth daily 2)  Furosemide 20 Mg Tabs (Furosemide) .... Take 1 tab by mouth every day prn 3)  Potassium Chloride Cr 10 Meq Tbcr (Potassium chloride) .Marland Kitchen.. 1 by mouth once daily prn 4)  Balanced B-100 Tabs (B complex vitamins) .Marland Kitchen.. 1 by mouth qd 5)  Ativan 2 Mg Tabs (Lorazepam) .Marland Kitchen.. 1 by mouth tid 6)  Nitroglycerin 0.4 Mg Subl (Nitroglycerin) .Marland Kitchen.. 1 sl prn 7)  Coreg 25 Mg Tabs (Carvedilol) .Marland Kitchen.. 1 by mouth two times a day for blood pressure 8)  Ranitidine Hcl 300 Mg Tabs (Ranitidine hcl) .Marland Kitchen.. 1 by mouth once daily for indigestion 9)  Aspir-low 81 Mg Tbec (Aspirin) .Marland Kitchen.. 1 bid  after meal 10)  Cozaar 100 Mg Tabs (Losartan potassium) .Marland Kitchen.. 1 by mouth qd 11)  Vitamin B-12 1000 Mcg Subl (Cyanocobalamin) .Marland Kitchen.. 1 by mouth qd 12)  Vitamin D3 1000 Unit Tabs  (Cholecalciferol) .... 2 once daily by mouth  Other Orders: Gastroenterology Referral (GI)  Contraindications/Deferment of Procedures/Staging:    Test/Procedure: FLU VAX    Reason for deferment: patient declined   Patient Instructions: 1)  Please schedule a follow-up appointment in 3 months well w/labs and Vit D 268.9 and B12 782.0. Prescriptions: VITAMIN B-12 1000 MCG SUBL (CYANOCOBALAMIN) 1 by mouth qd  #100 x 3   Entered and Authorized by:   Tresa Garter MD   Signed by:   Tresa Garter MD on 09/27/2009   Method used:   Electronically to        [facility] [phone]* (retail)       [address] [place], [place]  [address]       Ph: [phone]       Fax: [phone]  RxID:   [id number]

## 2010-02-13 NOTE — Assessment & Plan Note (Signed)
Summary: 6 MTH FU-STC   Vital Signs:  Patient Profile:   76 Years Old Female Weight:      188 pounds Temp:     97 degrees F oral Pulse rate:   82 / minute BP sitting:   222 / 110  (left arm)  Vitals Entered By: Tora Perches (February 05, 2007 10:45 AM)             Is Patient Diabetic? No     Chief Complaint:  Multiple medical problems or concerns.  History of Present Illness: The patient presents for a follow up of hypertension, hyperlipidemia, cva. Ran out of all meds. Tearfull. Can't emember much, can't focus, can't sleep   Current Allergies (reviewed today): No known allergies   Past Medical History:    Reviewed history and no changes required:       Anxiety       Depression       GERD       Hyperlipidemia       Hypertension       Osteoporosis       Cerebrovascular accident, hx of       Thyroid cysts       Cholelithiasis       MR, mild       Memory loss   Family History:    Reviewed history and no changes required:       Family History Hypertension  Social History:    Reviewed history and no changes required:       Retired       Married       Never Smoked   Risk Factors:  Tobacco use:  never   Review of Systems       The patient complains of chest pain, muscle weakness, difficulty walking, and depression.  The patient denies dyspnea on exhertion, prolonged cough, hemoptysis, melena, and hematochezia.     Physical Exam  General:     NAD Eyes:     pupils equal.   Ears:     External ear exam shows no significant lesions or deformities.discharge. Hearing is grossly normal bilaterally. Nose:     External nasal examination shows no deformity or inflammation. Nasal mucosa are pink and moist without lesions or exudates. Mouth:     Oral mucosa and oropharynx without lesions or exudates.  Neck:     No deformities, masses, or tenderness noted. Lungs:     CTA Heart:     RRR Abdomen:     Bowel sounds positive,abdomen soft and non-tender  without masses, organomegaly or hernias noted. Msk:     Lumbar-sacral spine is tender to palpation over paraspinal muscles and painfull with the ROM  Pulses:     R and L carotid,radial,femoral,dorsalis pedis and posterior tibial pulses are full and equal bilaterally Extremities:     trace left pedal edema and trace right pedal edema.   Neurologic:     alert & oriented X2.  alert & oriented X3, DTRs symmetrical and normal, finger-to-nose normal, and ataxic gait.   Skin:     Aging changes Psych:     Oriented X2.  Oriented X3, not agitated, depressed affect, tearful, severely anxious, easily distracted, poor concentration, memory impairment, disoriented to time, and judgment poor.      Impression & Recommendations:  Problem # 1:  HYPERTENSION (ICD-401.9) Assessment: Deteriorated Risks of noncompliance with treatment discussed. Compliance encouraged.  Her updated and restarted medication list for this problem includes:  Indapamide 1.25 Mg Tabs (Indapamide) .Marland Kitchen... 1 by mouth qam    Enalapril Maleate 20 Mg Tabs (Enalapril maleate) .Marland Kitchen... Take 1 tab by mouth daily    Coreg 12.5 Mg Tabs (Carvedilol) .Marland Kitchen... Take 1 tab by mouth twice a day  Her updated medication list for this problem includes:    Indapamide 1.25 Mg Tabs (Indapamide) .Marland Kitchen... 1 by mouth qam    Enalapril Maleate 20 Mg Tabs (Enalapril maleate) .Marland Kitchen... Take 1 tab by mouth daily    Coreg 12.5 Mg Tabs (Carvedilol) .Marland Kitchen... Take 1 tab by mouth twice a day   Problem # 2:  DEPRESSION (ICD-311) Assessment: Deteriorated Risks of noncompliance with treatment discussed. Compliance encouraged.  Her updated medication list for this problem includes:    Fluoxetine Hcl 20 Mg Tabs (Fluoxetine hcl) .Marland Kitchen... Take 1 tab by mouth daily    Ativan 1 Mg Tabs (Lorazepam) .Marland Kitchen... 1 tab 2 - 3  times a day as needed anxiety   Problem # 3:  ANXIETY (ICD-300.00) Assessment: Deteriorated  Her updated medication list for this problem includes:    Fluoxetine  Hcl 20 Mg Tabs (Fluoxetine hcl) .Marland Kitchen... Take 1 tab by mouth daily    Ativan 1 Mg Tabs (Lorazepam) .Marland Kitchen... 1 tab 2 - 3  times a day as needed anxiety   Problem # 4:  GERD (ICD-530.81) Assessment: Deteriorated  Her updated medication list for this problem includes:    Ranitidine Hcl 150 Mg Caps (Ranitidine hcl) .Marland Kitchen... 1 po bid   Problem # 5:  CEREBROVASCULAR ACCIDENT, HX OF (ICD-V12.50) Assessment: Unchanged Chronic memory loss.  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 Unable tolearn new things  Complete Medication List: 1)  Indapamide 1.25 Mg Tabs (Indapamide) .Marland Kitchen.. 1 by mouth qam 2)  Enalapril Maleate 20 Mg Tabs (Enalapril maleate) .... Take 1 tab by mouth daily 3)  Fluoxetine Hcl 20 Mg Tabs (Fluoxetine hcl) .... Take 1 tab by mouth daily 4)  Coreg 12.5 Mg Tabs (Carvedilol) .... Take 1 tab by mouth twice a day 5)  Ranitidine Hcl 150 Mg Caps (Ranitidine hcl) .Marland Kitchen.. 1 po bid 6)  Ativan 1 Mg Tabs (Lorazepam) .Marland Kitchen.. 1 tab 2 - 3  times a day as needed anxiety 7)  Aspir-low 81 Mg Tbec (Aspirin) .Marland Kitchen.. 1 once daily after meal 8)  Vitamin D3 1000 Unit Tabs (Cholecalciferol) .Marland Kitchen.. 1 qd   Patient Instructions: 1)  Please schedule a follow-up appointment in 3 months.    Prescriptions: ATIVAN 1 MG  TABS (LORAZEPAM) 1 tab 2 - 3  times a day as needed anxiety  #60 x 6   Entered and Authorized by:   Tresa Garter MD   Signed by:   Tresa Garter MD on 02/05/2007   Method used:   Print then Give to Patient   RxID:   [id number] RANITIDINE HCL 150 MG CAPS (RANITIDINE HCL) 1 po bid  #60 x 12   Entered and Authorized by:   Tresa Garter MD   Signed by:   Tresa Garter MD on 02/05/2007   Method used:   Print then Give to Patient   RxID:   [id number] COREG 12.5 MG  TABS (CARVEDILOL) Take 1 tab by mouth twice a day  #60 x 12   Entered and Authorized by:    Tresa Garter MD   Signed by:   Tresa Garter MD  on 02/05/2007   Method used:   Print then Give to Patient   RxID:   [id number] FLUOXETINE HCL 20 MG  TABS (FLUOXETINE HCL) Take 1 tab by mouth daily  #30 x 12   Entered and Authorized by:   Tresa Garter MD   Signed by:   Tresa Garter MD on 02/05/2007   Method used:   Print then Give to Patient   RxID:   [id number] ENALAPRIL MALEATE 20 MG  TABS (ENALAPRIL MALEATE) Take 1 tab by mouth daily  #60 x 12   Entered and Authorized by:   Tresa Garter MD   Signed by:   Tresa Garter MD on 02/05/2007   Method used:   Print then Give to Patient   RxID:   [id number] INDAPAMIDE 1.25 MG  TABS (INDAPAMIDE) 1 by mouth qam  #30 x 12   Entered and Authorized by:   Tresa Garter MD   Signed by:   Tresa Garter MD on 02/05/2007   Method used:   Print then Give to Patient   RxID:   [id number]  ]  Appended Document: 6 MTH FU-STC Oriented x 2 is correct.

## 2010-02-13 NOTE — Assessment & Plan Note (Signed)
Summary: not feeling well, not sleeping/jss   Vital Signs:  Patient Profile:   76 Years Old Female Weight:      180 pounds Temp:     97.4 degrees F oral Pulse rate:   71 / minute BP sitting:   189 / 102  (left arm)  Vitals Entered By: Tora Perches (July 24, 2007 2:35 PM)                 Chief Complaint:  Multiple medical problems or concerns.  History of Present Illness: In May had a spell of HA, weakness, CP. Constant CP at night, HA, pain in neck. Had weaknss L and R. C/o cramps in L big toe. Can't sleep at night.    Current Allergies: No known allergies   Past Medical History:    Reviewed history from 05/07/2007 and no changes required:       Anxiety       Depression       GERD       Hyperlipidemia       Hypertension       Osteoporosis       Cerebrovascular accident, hx of       Thyroid cysts       Cholelithiasis       MR, mild       Memory loss       Gout 2009   Family History:    Reviewed history from 02/05/2007 and no changes required:       Family History Hypertension  Social History:    Reviewed history from 02/05/2007 and no changes required:       Retired       Married       Never Smoked    Review of Systems       The patient complains of chest pain and headaches.  The patient denies fever, syncope, prolonged cough, abdominal pain, and melena.     Physical Exam  General:     NAD Nose:     External nasal examination shows no deformity or inflammation. Nasal mucosa are pink and moist without lesions or exudates. Mouth:     Oral mucosa and oropharynx without lesions or exudates.  Teeth in good repair. Neck:     No deformities, masses, or tenderness noted. Lungs:     Normal respiratory effort, chest expands symmetrically. Lungs are clear to auscultation, no crackles or wheezes. Heart:     Normal rate and regular rhythm. S1 and S2 normal without gallop, murmur, click, rub or other extra sounds. Abdomen:     Bowel sounds  positive,abdomen soft and non-tender without masses, organomegaly or hernias noted. Msk:     No deformity or scoliosis noted of thoracic or lumbar spine.   Neurologic:     alert & oriented X3, DTRs symmetrical and normal, and ataxic gait.   Skin:     Intact without suspicious lesions or rashes Psych:     labile affect, tearful, severely anxious, easily distracted, poor concentration, memory impairment, judgment poor, and agitated.      Impression & Recommendations:  Problem # 1:  CEREBROVASCULAR ACCIDENT, HX OF (ICD-V12.50)  Orders: EMR Misc Charge Code [facility])   Problem # 2:  MEMORY LOSS (ICD-780.93)  Problem # 3:  HYPERTENSION (ICD-401.9) Assessment: Unchanged BP OK at home SBP=130-140 Her updated medication list for this problem includes:    Enalapril Maleate 20 Mg Tabs (Enalapril maleate) .Marland Kitchen... Take 1 tab by mouth daily  Coreg 12.5 Mg Tabs (Carvedilol) .Marland Kitchen... Take 1 tab by mouth twice a day    Furosemide 20 Mg Tabs (Furosemide) .Marland Kitchen... Take 1 tab by mouth every day   Problem # 4:  DEPRESSION (ICD-311) Assessment: Deteriorated There is a lot of psycho-somatic overlay The following medications were removed from the medication list:    Ativan 1 Mg Tabs (Lorazepam) .Marland Kitchen... 1 tab 2 - 3  times a day as needed anxiety  Her updated medication list for this problem includes:    Fluoxetine Hcl 20 Mg Tabs (Fluoxetine hcl) .Marland Kitchen... Take 2 tab by mouth daily    Ativan 2 Mg Tabs (Lorazepam) .Marland Kitchen... 1 by mouth tid   Problem # 5:  ANXIETY (ICD-300.00) Assessment: Deteriorated  The following medications were removed from the medication list:    Ativan 1 Mg Tabs (Lorazepam) .Marland Kitchen... 1 tab 2 - 3  times a day as needed anxiety  Her updated medication list for this problem includes:    Fluoxetine Hcl 20 Mg Tabs (Fluoxetine hcl) .Marland Kitchen... Take 2 tab by mouth daily    Ativan 2 Mg Tabs (Lorazepam) .Marland Kitchen... 1 by mouth tid   Complete Medication List: 1)  Enalapril Maleate 20 Mg Tabs (Enalapril  maleate) .... Take 1 tab by mouth daily 2)  Fluoxetine Hcl 20 Mg Tabs (Fluoxetine hcl) .... Take 2 tab by mouth daily 3)  Coreg 12.5 Mg Tabs (Carvedilol) .... Take 1 tab by mouth twice a day 4)  Ranitidine Hcl 150 Mg Caps (Ranitidine hcl) .Marland Kitchen.. 1 po bid 5)  Aspir-low 81 Mg Tbec (Aspirin) .Marland Kitchen.. 1 bid  after meal 6)  Vitamin D3 1000 Unit Tabs (Cholecalciferol) .Marland Kitchen.. 1 qd 7)  Furosemide 20 Mg Tabs (Furosemide) .... Take 1 tab by mouth every day 8)  Potassium Chloride Cr 10 Meq Tbcr (Potassium chloride) .Marland Kitchen.. 1 by mouth qd 9)  Ibuprofen 600 Mg Tabs (Ibuprofen) .Marland Kitchen.. 1 by mouth two times a day pc x 5 d, then as needed gout 10)  Balanced B-100 Tabs (B complex vitamins) .Marland Kitchen.. 1 by mouth qd 11)  Ativan 2 Mg Tabs (Lorazepam) .Marland Kitchen.. 1 by mouth tid 12)  Hydrocodone-acetaminophen 5-325 Mg Tabs (Hydrocodone-acetaminophen) .Marland Kitchen.. 1 by mouth qid as needed pain   Patient Instructions: 1)  Please schedule a follow-up appointment in 2 months.   Prescriptions: HYDROCODONE-ACETAMINOPHEN 5-325 MG  TABS (HYDROCODONE-ACETAMINOPHEN) 1 by mouth qid as needed pain  #60 x 1   Entered and Authorized by:   Tresa Garter MD   Signed by:   Tresa Garter MD on 07/24/2007   Method used:   Print then Give to Patient   RxID:   [id number] ATIVAN 2 MG  TABS (LORAZEPAM) 1 by mouth tid  #90 x 6   Entered and Authorized by:   Tresa Garter MD   Signed by:   Tresa Garter MD on 07/24/2007   Method used:   Print then Give to Patient   RxID:   [id number] FLUOXETINE HCL 20 MG  TABS (FLUOXETINE HCL) Take 2 tab by mouth daily  #60 x 12   Entered and Authorized by:   Tresa Garter MD   Signed by:   Tresa Garter MD on 07/24/2007   Method used:   Print then Give to Patient   RxID:   [id number]  ]

## 2010-02-13 NOTE — Letter (Signed)
Summary: [facility]   Imported By: Lester Allegheny 12/27/2008 08:33:39  _____________________________________________________________________  External Attachment:    Type:   Image     Comment:   External Document

## 2010-02-13 NOTE — Assessment & Plan Note (Signed)
Summary: 4 mth fu/$50/jss   Vital Signs:  Patient Profile:   76 Years Old Female Weight:      185 pounds Temp:     97. degrees F oral Pulse rate:   64 / minute BP sitting:   146 / 92  (left arm)  Vitals Entered By: Tora Perches (March 03, 2008 10:14 AM)                 Chief Complaint:  Multiple medical problems or concerns.  History of Present Illness: The patient presents for a follow up of hypertension, CVA, hyperlipidemia     Prior Medications Reviewed Using: Patient Recall  Prior Medication List:  FLUOXETINE HCL 20 MG  TABS (FLUOXETINE HCL) Take 2 tab by mouth daily ENALAPRIL MALEATE 20 MG  TABS (ENALAPRIL MALEATE) Take 1 tab by mouth daily COREG 12.5 MG  TABS (CARVEDILOL) Take 1 tab by mouth twice a day RANITIDINE HCL 150 MG CAPS (RANITIDINE HCL) 1 po bid ASPIR-LOW 81 MG TBEC (ASPIRIN) 1 bid  after meal VITAMIN D3 1000 UNIT  TABS (CHOLECALCIFEROL) 1 qd FUROSEMIDE 20 MG TABS (FUROSEMIDE) Take 1 tab by mouth every day POTASSIUM CHLORIDE CR 10 MEQ  TBCR (POTASSIUM CHLORIDE) 1 by mouth qd IBUPROFEN 600 MG  TABS (IBUPROFEN) 1 by mouth two times a day pc x 5 d, then as needed gout BALANCED B-100   TABS (B COMPLEX VITAMINS) 1 by mouth qd ATIVAN 2 MG  TABS (LORAZEPAM) 1 by mouth tid HYDROCODONE-ACETAMINOPHEN 5-325 MG  TABS (HYDROCODONE-ACETAMINOPHEN) 1 by mouth qid as needed pain NITROGLYCERIN 0.4 MG SUBL (NITROGLYCERIN) 1 sl prn   Current Allergies: No known allergies   Past Medical History:    Reviewed history from 05/07/2007 and no changes required:       Anxiety       Depression       GERD       Hyperlipidemia       Hypertension       Osteoporosis       Cerebrovascular accident, hx of       Thyroid cysts       Cholelithiasis       MR, mild       Memory loss       Gout 2009       Vit D def   Family History:    Reviewed history from 02/05/2007 and no changes required:       Family History Hypertension  Social History:    Reviewed history from  02/05/2007 and no changes required:       Retired       Married       Never Smoked      Physical Exam  General:     Well-developed,well-nourished,in no acute distress; alert,appropriate and cooperative throughout examination Eyes:     pupils equal.   Nose:     External nasal examination shows no deformity or inflammation. Nasal mucosa are pink and moist without lesions or exudates. Neck:     No deformities, masses, or tenderness noted. Lungs:     Normal respiratory effort, chest expands symmetrically. Lungs are clear to auscultation, no crackles or wheezes. Heart:     Normal rate and regular rhythm. S1 and S2 normal without gallop, murmur, click, rub or other extra sounds. Abdomen:     Bowel sounds positive,abdomen soft and non-tender without masses, organomegaly or hernias noted. Msk:     No deformity or scoliosis noted of thoracic or lumbar  spine.   Extremities:     trace left pedal edema and trace right pedal edema.   Skin:     Intact without suspicious lesions or rashes Psych:     Cognition and judgment appear intact. Alert and cooperative with normal attention span and concentration. No apparent delusions, illusions, hallucinationsnormally interactive, depressed affect, and tearful.      Impression & Recommendations:  Problem # 1:  FOOT PAIN (ICD-729.5) R>L  Orders: Podiatry Referral (Podiatry)   Problem # 2:  GOUT (ICD-274.9) Assessment: Improved  Her updated medication list for this problem includes:    Ibuprofen 600 Mg Tabs (Ibuprofen) .Marland Kitchen... 1 by mouth two times a day pc x 5 d, then as needed gout    Allopurinol 100 Mg Tabs (Allopurinol) .Marland Kitchen... 1 by mouth daily   Problem # 3:  GAIT IMBALANCE (ICD-781.2)  Problem # 4:  HYPERTENSION (ICD-401.9)  Her updated medication list for this problem includes:    Enalapril Maleate 20 Mg Tabs (Enalapril maleate) .Marland Kitchen... Take 1 tab by mouth daily    Coreg 12.5 Mg Tabs (Carvedilol) .Marland Kitchen... Take 1 tab by mouth twice a  day    Furosemide 20 Mg Tabs (Furosemide) .Marland Kitchen... Take 1 tab by mouth every day  Orders: T-Vitamin D (25-Hydroxy) [phone]) T-Uric Acid (Blood) (84550-23180) TLB-B12, Serum-Total ONLY (53664-Q03) TLB-BMP (Basic Metabolic Panel-BMET) (80048-METABOL) TLB-CBC Platelet - w/Differential (85025-CBCD) TLB-Lipid Panel (80061-LIPID) TLB-Sedimentation Rate (ESR) (85652-ESR) TLB-TSH (Thyroid Stimulating Hormone) (84443-TSH) TLB-Udip ONLY (81003-UDIP) TLB-Udip w/ Micro (81001-URINE)   Problem # 5:  VITAMIN D DEFICIENCY (ICD-268.9) Assessment: Comment Only On prescription therapy   Complete Medication List: 1)  Fluoxetine Hcl 20 Mg Tabs (Fluoxetine hcl) .... Take 2 tab by mouth daily 2)  Enalapril Maleate 20 Mg Tabs (Enalapril maleate) .... Take 1 tab by mouth daily 3)  Coreg 12.5 Mg Tabs (Carvedilol) .... Take 1 tab by mouth twice a day 4)  Ranitidine Hcl 150 Mg Caps (Ranitidine hcl) .Marland Kitchen.. 1 po bid 5)  Aspir-low 81 Mg Tbec (Aspirin) .Marland Kitchen.. 1 bid  after meal 6)  Vitamin D3 1000 Unit Tabs (Cholecalciferol) .Marland Kitchen.. 1 qd 7)  Furosemide 20 Mg Tabs (Furosemide) .... Take 1 tab by mouth every day 8)  Potassium Chloride Cr 10 Meq Tbcr (Potassium chloride) .Marland Kitchen.. 1 by mouth qd 9)  Ibuprofen 600 Mg Tabs (Ibuprofen) .Marland Kitchen.. 1 by mouth two times a day pc x 5 d, then as needed gout 10)  Balanced B-100 Tabs (B complex vitamins) .Marland Kitchen.. 1 by mouth qd 11)  Ativan 2 Mg Tabs (Lorazepam) .Marland Kitchen.. 1 by mouth tid 12)  Hydrocodone-acetaminophen 5-325 Mg Tabs (Hydrocodone-acetaminophen) .Marland Kitchen.. 1 by mouth qid as needed pain 13)  Nitroglycerin 0.4 Mg Subl (Nitroglycerin) .Marland Kitchen.. 1 sl prn 14)  Vitamin D 47425 Unit Caps (Ergocalciferol) .Marland Kitchen.. 1 by mouth weekly 15)  Allopurinol 100 Mg Tabs (Allopurinol) .Marland Kitchen.. 1 by mouth daily   Patient Instructions: 1)  Please schedule a follow-up appointment in 3 months.   Prescriptions: ALLOPURINOL 100 MG TABS (ALLOPURINOL) 1 by mouth daily  #30 x 12   Entered and Authorized by:   Tresa Garter MD   Signed by:   Tresa Garter MD on 03/08/2008   Method used:   Print then Give to Patient   RxID:   [id number] VITAMIN D 84166 UNIT  CAPS (ERGOCALCIFEROL) 1 by mouth weekly  #6 x 0   Entered and Authorized by:   Tresa Garter MD   Signed by:   Tresa Garter MD  on 03/08/2008   Method used:   Print then Give to Patient   RxID:   [id number]

## 2010-02-13 NOTE — Letter (Signed)
Summary: Pre Visit Letter Revised  [facility]  [address] [place], [place] [address]   Phone: [phone]  Fax: [phone]        11/17/2009 MRN: [id number] Alice Wong [address] [place], [place]  [address]             Procedure Date:  12/11/2009   Welcome to the Gastroenterology Division at [facility].    You are scheduled to see a nurse for your pre-procedure visit on 12/01/2009 at 9:00AM on the 3rd floor at [facility], [address]  We ask that you try to arrive at our office 15 minutes prior to your appointment time to allow for check-in.  Please take a minute to review the attached form.  If you answer "Yes" to one or more of the questions on the first page, we ask that you call the person listed at your earliest opportunity.  If you answer "No" to all of the questions, please complete the rest of the form and bring it to your appointment.    Your nurse visit will consist of discussing your medical and surgical history, your immediate family medical history, and your medications.   If you are unable to list all of your medications on the form, please bring the medication bottles to your appointment and we will list them.  We will need to be aware of both prescribed and over the counter drugs.  We will need to know exact dosage information as well.    Please be prepared to read and sign documents such as consent forms, a financial agreement, and acknowledgement forms.  If necessary, and with your consent, a friend or relative is welcome to sit-in on the nurse visit with you.  Please bring your insurance card so that we may make a copy of it.  If your insurance requires a referral to see a specialist, please bring your referral form from your primary care physician.  No co-pay is required for this nurse visit.     If you cannot keep your appointment, please call [phone] to cancel or reschedule prior to your appointment date.  This  allows [place] the opportunity to schedule an appointment for another patient in need of care.    Thank you for choosing [facility] for your medical needs.  We appreciate the opportunity to care for you.  Please visit [place] at our website  to learn more about our practice.  Sincerely, The Gastroenterology Division

## 2010-02-13 NOTE — Assessment & Plan Note (Signed)
Summary: 3 MTH FU  $50  STC   Vital Signs:  Patient profile:   76 year old female Weight:      173 pounds Temp:     97.5 degrees F oral Pulse rate:   62 / minute BP sitting:   144 / 98  (left arm)  Vitals Entered By: Tora Perches (December 12, 2008 8:07 AM) CC: f/u Is Patient Diabetic? No   CC:  f/u.  History of Present Illness: The patient presents for a follow up of hypertension, diabetes, hyperlipidemia, anxiety... No CP after surgery No gout issues...  Current Medications (verified): 1)  Fluoxetine Hcl 20 Mg  Tabs (Fluoxetine Hcl) .... Take 2 Tab By Mouth Daily 2)  Enalapril Maleate 20 Mg  Tabs (Enalapril Maleate) .... Take 1 Tab By Mouth Daily 3)  Coreg 12.5 Mg  Tabs (Carvedilol) .... Take 1 Tab By Mouth Twice A Day 4)  Aspir-Low 81 Mg Tbec (Aspirin) .Marland Kitchen.. 1 Bid  After Meal 5)  Vitamin D3 1000 Unit  Tabs (Cholecalciferol) .Marland Kitchen.. 1 Qd 6)  Furosemide 20 Mg Tabs (Furosemide) .... Take 1 Tab By Mouth Every Day 7)  Potassium Chloride Cr 10 Meq  Tbcr (Potassium Chloride) .Marland Kitchen.. 1 By Mouth Qd 8)  Balanced B-100   Tabs (B Complex Vitamins) .Marland Kitchen.. 1 By Mouth Qd 9)  Ativan 2 Mg  Tabs (Lorazepam) .Marland Kitchen.. 1 By Mouth Tid 10)  Nitroglycerin 0.4 Mg Subl (Nitroglycerin) .Marland Kitchen.. 1 Sl Prn 11)  Indapamide 1.25 Mg Tabs (Indapamide) .Marland Kitchen.. 1 By Mouth Qam  Allergies: 1)  Morphine Sulfate (Morphine Sulfate)  Past History:  Past Medical History: Last updated: 09/13/2008 Anxiety Depression GERD Hyperlipidemia Hypertension Osteoporosis Cerebrovascular accident, hx of Thyroid cysts Cholelithiasis MR, mild Memory loss Gout 2009 Vit D def Vaso-vagal syncope/spells triggered by GS  Social History: Last updated: 02/05/2007 Retired Married Never Smoked  Past Surgical History: Cholecystectomy 2010  Review of Systems  The patient denies vision loss, chest pain, syncope, headaches, abdominal pain, and hematochezia.         BP OK  Physical Exam  General:  Well-developed,well-nourished,in  no acute distress; alert,appropriate and cooperative throughout examination Nose:  External nasal examination shows no deformity or inflammation. Nasal mucosa are pink and moist without lesions or exudates. Mouth:  Oral mucosa and oropharynx without lesions or exudates.  Teeth in good repair. Lungs:  Normal respiratory effort, chest expands symmetrically. Lungs are clear to auscultation, no crackles or wheezes. Heart:  Normal rate and regular rhythm. S1 and S2 normal without gallop, murmur, click, rub or other extra sounds. Abdomen:  Bowel sounds positive,abdomen soft and non-tender without masses, organomegaly or hernias noted. Msk:  No deformity or scoliosis noted of thoracic or lumbar spine.   Extremities:  trace left pedal edema and trace right pedal edema.   Neurologic:  No cranial nerve deficits noted. Station and gait are normal. Plantar reflexes are down-going bilaterally. DTRs are symmetrical throughout. Sensory, motor and coordinative functions appear intact. Skin:  Intact without suspicious lesions or rashes Psych:  Oriented, normally interactive, good eye contact, not agitated, and slightly anxious.     Impression & Recommendations:  Problem # 1:  FOOT PAIN (ICD-729.5) R deformity Assessment Unchanged Dr Ralene Cork adviced surgery  Problem # 2:  GAIT IMBALANCE (ICD-781.2) Assessment: Unchanged  Problem # 3:  HYPERTENSION (ICD-401.9)  Her updated medication list for this problem includes:    Enalapril Maleate 20 Mg Tabs (Enalapril maleate) .Marland Kitchen... Take 1 tab by mouth daily    Coreg  12.5 Mg Tabs (Carvedilol) .Marland Kitchen... Take 1 tab by mouth twice a day    Furosemide 20 Mg Tabs (Furosemide) .Marland Kitchen... Take 1 tab by mouth every day prn    Indapamide 1.25 Mg Tabs (Indapamide) .Marland Kitchen... 1 by mouth qam  Orders: TLB-Lipid Panel (80061-LIPID) TLB-Hepatic/Liver Function Pnl (80076-HEPATIC) TLB-BMP (Basic Metabolic Panel-BMET) (80048-METABOL) TLB-Uric Acid, Blood (84550-URIC)  Problem # 4:   HYPERLIPIDEMIA (ICD-272.4) Assessment: Comment Only Off Rx now - ran out... She was on Pravastatin most likely...  Problem # 5:  ANXIETY (ICD-300.00) Assessment: Comment Only  Her updated medication list for this problem includes:    Fluoxetine Hcl 20 Mg Tabs (Fluoxetine hcl) .Marland Kitchen... Take 2 tab by mouth daily    Ativan 2 Mg Tabs (Lorazepam) .Marland Kitchen... 1 by mouth tid  Problem # 6:  GERD (ICD-530.81) Assessment: Comment Only  Complete Medication List: 1)  Fluoxetine Hcl 20 Mg Tabs (Fluoxetine hcl) .... Take 2 tab by mouth daily 2)  Enalapril Maleate 20 Mg Tabs (Enalapril maleate) .... Take 1 tab by mouth daily 3)  Coreg 12.5 Mg Tabs (Carvedilol) .... Take 1 tab by mouth twice a day 4)  Aspir-low 81 Mg Tbec (Aspirin) .Marland Kitchen.. 1 bid  after meal 5)  Vitamin D3 1000 Unit Tabs (Cholecalciferol) .Marland Kitchen.. 1 qd 6)  Furosemide 20 Mg Tabs (Furosemide) .... Take 1 tab by mouth every day prn 7)  Potassium Chloride Cr 10 Meq Tbcr (Potassium chloride) .Marland Kitchen.. 1 by mouth once daily prn 8)  Balanced B-100 Tabs (B complex vitamins) .Marland Kitchen.. 1 by mouth qd 9)  Ativan 2 Mg Tabs (Lorazepam) .Marland Kitchen.. 1 by mouth tid 10)  Nitroglycerin 0.4 Mg Subl (Nitroglycerin) .Marland Kitchen.. 1 sl prn 11)  Indapamide 1.25 Mg Tabs (Indapamide) .Marland Kitchen.. 1 by mouth qam  Patient Instructions: 1)  Please schedule a follow-up appointment in 3 months. 2)  Please schedule a follow-up appointment in 3 months. 3)  BMP prior to visit, ICD-9: 4)  Hepatic Panel prior to visit, ICD-9: 5)  CBC w/ Diff prior to visit, ICD-9: 401.1 6)  Lipid Panel prior to visit, ICD-9:272 0 Prescriptions: COREG 12.5 MG  TABS (CARVEDILOL) Take 1 tab by mouth twice a day  #60 x 12   Entered and Authorized by:   Tresa Garter MD   Signed by:   Tresa Garter MD on 12/12/2008   Method used:   Electronically to        [facility] [phone]* (retail)       [address] [place], [place]  [address]       Ph: [phone]       Fax: [phone]   RxID:   [id number]

## 2010-02-13 NOTE — Assessment & Plan Note (Signed)
Summary: 3 MO ROV/NWS   Vital Signs:  Patient Profile:   76 Years Old Female Weight:      184 pounds Temp:     97.6 degrees F oral Pulse rate:   68 / minute BP sitting:   148 / 88  (right arm)  Vitals Entered By: Lamar Sprinkles (May 07, 2007 8:19 AM)                 History of Present Illness: The patient presents for a follow up of hypertension, depression, hyperlipidemia. C/o painfull swelling MTP.     Current Allergies: No known allergies   Past Medical History:    Reviewed history from 02/05/2007 and no changes required:       Anxiety       Depression       GERD       Hyperlipidemia       Hypertension       Osteoporosis       Cerebrovascular accident, hx of       Thyroid cysts       Cholelithiasis       MR, mild       Memory loss       Gout 2009   Family History:    Reviewed history from 02/05/2007 and no changes required:       Family History Hypertension  Social History:    Reviewed history from 02/05/2007 and no changes required:       Retired       Married       Never Smoked    Review of Systems       Syncope once.   Physical Exam  General:     Well-developed,well-nourished,in no acute distress; alert,appropriate and cooperative throughout examination Mouth:     Oral mucosa and oropharynx without lesions or exudates.  Teeth in good repair. Lungs:     Normal respiratory effort, chest expands symmetrically. Lungs are clear to auscultation, no crackles or wheezes. Heart:     Normal rate and regular rhythm. S1 and S2 normal without gallop, murmur, click, rub or other extra sounds. Abdomen:     Bowel sounds positive,abdomen soft and non-tender without masses, organomegaly or hernias noted. Msk:     R 1st MTP is red and tender B bunions Extremities:        Neurologic:     alert & oriented X3.   Psych:     depressed affect and tearful.      Impression & Recommendations:  Problem # 1:  FOOT PAIN (ICD-729.5)R Assessment:  New gout  Problem # 2:  GOUT, UNSPECIFIED (ICD-274.9) Assessment: New  Her updated medication list for this problem includes:    Ibuprofen 600 Mg Tabs (Ibuprofen) .Marland Kitchen... 1 by mouth two times a day pc x 5 d, then as needed gout D/c Indapamide  Orders: EMR Electrical engineer Code [facility])   Problem # 3:  MEMORY LOSS (ICD-780.93) Assessment: Unchanged  Problem # 4:  CEREBROVASCULAR ACCIDENT, HX OF (ICD-V12.50) Assessment: Comment Only On Rx Orders: EMR Misc Charge Code [facility])   Problem # 5:  HYPERLIPIDEMIA (ICD-272.4) Assessment: Unchanged  Problem # 6:  ANXIETY (ICD-300.00) Assessment: Improved  Her updated medication list for this problem includes:    Fluoxetine Hcl 20 Mg Tabs (Fluoxetine hcl) .Marland Kitchen... Take 1 tab by mouth daily    Ativan 1 Mg Tabs (Lorazepam) .Marland Kitchen... 1 tab 2 - 3  times a day as needed anxiety   Problem #  7:  DEPRESSION (ICD-311) Assessment: Improved  Her updated medication list for this problem includes:    Fluoxetine Hcl 20 Mg Tabs (Fluoxetine hcl) .Marland Kitchen... Take 1 tab by mouth daily    Ativan 1 Mg Tabs (Lorazepam) .Marland Kitchen... 1 tab 2 - 3  times a day as needed anxiety   Complete Medication List: 1)  Enalapril Maleate 20 Mg Tabs (Enalapril maleate) .... Take 1 tab by mouth daily 2)  Fluoxetine Hcl 20 Mg Tabs (Fluoxetine hcl) .... Take 1 tab by mouth daily 3)  Coreg 12.5 Mg Tabs (Carvedilol) .... Take 1 tab by mouth twice a day 4)  Ranitidine Hcl 150 Mg Caps (Ranitidine hcl) .Marland Kitchen.. 1 po bid 5)  Ativan 1 Mg Tabs (Lorazepam) .Marland Kitchen.. 1 tab 2 - 3  times a day as needed anxiety 6)  Aspir-low 81 Mg Tbec (Aspirin) .Marland Kitchen.. 1 once daily after meal 7)  Vitamin D3 1000 Unit Tabs (Cholecalciferol) .Marland Kitchen.. 1 qd 8)  Furosemide 20 Mg Tabs (Furosemide) .... Take 1 tab by mouth every day 9)  Potassium Chloride Cr 10 Meq Tbcr (Potassium chloride) .Marland Kitchen.. 1 by mouth qd 10)  Ibuprofen 600 Mg Tabs (Ibuprofen) .Marland Kitchen.. 1 by mouth two times a day pc x 5 d, then as needed gout   Patient  Instructions: 1)  Please schedule a follow-up appointment in 3 months.    Prescriptions: IBUPROFEN 600 MG  TABS (IBUPROFEN) 1 by mouth two times a day pc x 5 d, then as needed gout  #60 x 3   Entered and Authorized by:   Tresa Garter MD   Signed by:   Tresa Garter MD on 05/07/2007   Method used:   Print then Give to Patient   RxID:   [id number] POTASSIUM CHLORIDE CR 10 MEQ  TBCR (POTASSIUM CHLORIDE) 1 by mouth qd  #30 x 12   Entered and Authorized by:   Tresa Garter MD   Signed by:   Tresa Garter MD on 05/07/2007   Method used:   Print then Give to Patient   RxID:   [id number] FUROSEMIDE 20 MG TABS (FUROSEMIDE) Take 1 tab by mouth every day  #30 x 12   Entered and Authorized by:   Tresa Garter MD   Signed by:   Tresa Garter MD on 05/07/2007   Method used:   Print then Give to Patient   RxID:   [id number]  ]

## 2010-02-13 NOTE — Progress Notes (Signed)
Summary: FORMS  Phone Note Call from Patient   Summary of Call: Pt called asking if forms were filled out.  Initial call taken by: Lamar Sprinkles,  August 05, 2007 11:42 AM  Follow-up for Phone Call        Can pick up tomorrow Follow-up by: Tresa Garter MD,  August 05, 2007 1:10 PM  Additional Follow-up for Phone Call Additional follow up Details #1::        Lf mess forms ready Additional Follow-up by: Lamar Sprinkles,  August 06, 2007 1:10 PM

## 2010-02-13 NOTE — Letter (Signed)
Summary: [employer] Forms  [employer] Forms   Imported By: Esmeralda Links D'jimraou 03/16/2007 12:23:14  _____________________________________________________________________  External Attachment:    Type:   Image     Comment:   External Document

## 2010-02-13 NOTE — Assessment & Plan Note (Signed)
Summary: POST HOSP/ $50 /NWS   Vital Signs:  Patient profile:   76 year old female Weight:      177 pounds Temp:     96.6 degrees F oral Pulse rate:   59 / minute BP sitting:   146 / 82  (left arm)  Vitals Entered By: Tora Perches (September 13, 2008 9:33 AM) CC: post hosp Is Patient Diabetic? No   CC:  post hosp.  History of Present Illness: C/o near syncopal spells on occasion when she would feel severe pain in chest and L shoulder with  hot sweats (it may last x hrs; has been getting more frequent lately - 1-2 per month) - she would take NTG and would feel worse with cold nose and pallor. Overall, has had these spells x years, worse lately. She is a poor historian. Cardio-pulm. w/up was negative.  Allergies (verified): 1)  Morphine Sulfate (Morphine Sulfate)  Past History:  Family History: Last updated: 02/05/2007 Family History Hypertension  Social History: Last updated: 02/05/2007 Retired Married Never Smoked  Past Medical History: Anxiety Depression GERD Hyperlipidemia Hypertension Osteoporosis Cerebrovascular accident, hx of Thyroid cysts Cholelithiasis MR, mild Memory loss Gout 2009 Vit D def Vaso-vagal syncope/spells triggered by GS  Physical Exam  General:  Well-developed,well-nourished,in no acute distress; alert,appropriate and cooperative throughout examination Mouth:  Oral mucosa and oropharynx without lesions or exudates.  Teeth in good repair. Neck:  No deformities, masses, or tenderness noted. Lungs:  Normal respiratory effort, chest expands symmetrically. Lungs are clear to auscultation, no crackles or wheezes. Heart:  Normal rate and regular rhythm. S1 and S2 normal without gallop, murmur, click, rub or other extra sounds. Abdomen:  Bowel sounds positive,abdomen soft and non-tender without masses, organomegaly or hernias noted. Msk:  No deformity or scoliosis noted of thoracic or lumbar spine.   Extremities:  trace left pedal edema and  trace right pedal edema.   Neurologic:  No cranial nerve deficits noted. Station and gait are normal. Plantar reflexes are down-going bilaterally. DTRs are symmetrical throughout. Sensory, motor and coordinative functions appear intact. Skin:  Intact without suspicious lesions or rashes Psych:  Oriented, normally interactive, good eye contact, not agitated, and slightly anxious.     Impression & Recommendations:  Problem # 1:  SYNCOPE (ICD-780.2) (nearsyncope) due to #2 and 3 Assessment Deteriorated  See "Patient Instructions".  Orders: TLB-Hepatic/Liver Function Pnl (80076-HEPATIC) TLB-CBC Platelet - w/Differential (85025-CBCD) TLB-Udip ONLY (81003-UDIP)  Problem # 2:  CHOLELITHIASIS (ICD-574.20) Assessment: Deteriorated  Orders: TLB-Hepatic/Liver Function Pnl (80076-HEPATIC) TLB-CBC Platelet - w/Differential (85025-CBCD) TLB-Udip ONLY (81003-UDIP) Surgical Referral (Surgery)  Problem # 3:  CHEST PAIN, UNSPECIFIED (ICD-786.50) due to #2 Assessment: Deteriorated  Orders: Surgical Referral (Surgery) TLB-Hepatic/Liver Function Pnl (80076-HEPATIC) TLB-CBC Platelet - w/Differential (85025-CBCD) TLB-Udip ONLY (81003-UDIP)  Problem # 4:  HYPERTENSION (ICD-401.9) Assessment: Comment Only  Her updated medication list for this problem includes:    Enalapril Maleate 20 Mg Tabs (Enalapril maleate) .Marland Kitchen... Take 1 tab by mouth daily    Coreg 12.5 Mg Tabs (Carvedilol) .Marland Kitchen... Take 1 tab by mouth twice a day    Furosemide 20 Mg Tabs (Furosemide) .Marland Kitchen... Take 1 tab by mouth every day    Indapamide 1.25 Mg Tabs (Indapamide) .Marland Kitchen... 1 by mouth qam  Problem # 5:  ANXIETY (ICD-300.00) Assessment: Comment Only  Her updated medication list for this problem includes:    Fluoxetine Hcl 20 Mg Tabs (Fluoxetine hcl) .Marland Kitchen... Take 2 tab by mouth daily    Ativan 2 Mg Tabs (Lorazepam) .Marland KitchenMarland KitchenMarland KitchenMarland Kitchen  1 by mouth tid  Complete Medication List: 1)  Fluoxetine Hcl 20 Mg Tabs (Fluoxetine hcl) .... Take 2 tab by mouth  daily 2)  Enalapril Maleate 20 Mg Tabs (Enalapril maleate) .... Take 1 tab by mouth daily 3)  Coreg 12.5 Mg Tabs (Carvedilol) .... Take 1 tab by mouth twice a day 4)  Aspir-low 81 Mg Tbec (Aspirin) .Marland Kitchen.. 1 bid  after meal 5)  Vitamin D3 1000 Unit Tabs (Cholecalciferol) .Marland Kitchen.. 1 qd 6)  Furosemide 20 Mg Tabs (Furosemide) .... Take 1 tab by mouth every day 7)  Potassium Chloride Cr 10 Meq Tbcr (Potassium chloride) .Marland Kitchen.. 1 by mouth qd 8)  Balanced B-100 Tabs (B complex vitamins) .Marland Kitchen.. 1 by mouth qd 9)  Ativan 2 Mg Tabs (Lorazepam) .Marland Kitchen.. 1 by mouth tid 10)  Nitroglycerin 0.4 Mg Subl (Nitroglycerin) .Marland Kitchen.. 1 sl prn 11)  Indapamide 1.25 Mg Tabs (Indapamide) .Marland Kitchen.. 1 by mouth qam 12)  Tramadol Hcl 50 Mg Tabs (Tramadol hcl) .Marland Kitchen.. 1-2 by mouth two times a day as needed pain spell  Patient Instructions: 1)  Take Lorazepam, Ultram and lay down if spells 2)  Can have coffe 3)  Low fat diet 4)  Try to eat more raw plant food, fresh and dry fruit, raw almonds, leafy vegetables, whole foods and less red meat, less animal fat. Poultry and fish is better for you than pork and beef. Avoid processed foods (canned soups, hot dogs, sausage, bacon , frozen dinners). Avoid corn syrup, high fructose syrup or aspartam and Splenda  containing drinks. Honey, Agave and Stevia are better sweeteners. Make your own  dressing with olive oil, wine vinegar, lemon juce, garlic etc. for your salads. 5)  www.greensmoothiegirl.com 6)  www.rawfamily.com 7)  Please schedule a follow-up appointment in 3 months. Prescriptions: TRAMADOL HCL 50 MG  TABS (TRAMADOL HCL) 1-2 by mouth two times a day as needed pain spell  #90 x 3   Entered and Authorized by:   Tresa Garter MD   Signed by:   Tresa Garter MD on 09/13/2008   Method used:   Print then Give to Patient   RxID:   [id number]

## 2010-02-13 NOTE — Miscellaneous (Signed)
Summary: [facility] Previsit/prep  Clinical Lists Changes  Medications: Added new medication of MOVIPREP 100 GM  SOLR (PEG-KCL-NACL-NASULF-NA ASC-C) As per prep instructions. - Signed Rx of MOVIPREP 100 GM  SOLR (PEG-KCL-NACL-NASULF-NA ASC-C) As per prep instructions.;  #1 x 0;  Signed;  Entered by: Wyona Almas RN;  Authorized by: Louis Meckel MD;  Method used: Electronically to [facility] [address], [place], [place]  [address], Ph: [phone], Fax: [phone] Observations: Added new observation of ALLERGY REV: Done (12/01/2009 8:34)    Prescriptions: MOVIPREP 100 GM  SOLR (PEG-KCL-NACL-NASULF-NA ASC-C) As per prep instructions.  #1 x 0   Entered by:   Wyona Almas RN   Authorized by:   Louis Meckel MD   Signed by:   Wyona Almas RN on 12/01/2009   Method used:   Electronically to        [facility] [phone]* (retail)       [address] [place], [place]  [address]       Ph: [phone]       Fax: [phone]   RxID:   [id number]

## 2010-02-13 NOTE — Letter (Signed)
Summary: [facility] Referral (unable to schedule) Notification  [facility]  [address] [place], [place] [address]   Phone: [phone]  Fax: [phone]      November 14, 2009 Alice Wong [birth year] MRN: [id number]   Alice Wong [address] [place], [place]  [address]   Dear Dr. Posey Rea:   Thank you for your kind referral of the above patient. We have attempted to schedule the recommended Colonoscopy but have been unable to schedule because:  _x_ The patient was not available by phone and/or has not returned our calls.  __ The patient declined to schedule the procedure at this time.  We appreciate the referral and hope that we will have the opportunity to treat this patient in the future.    Sincerely,   [facility]  Vania Rea. Jarold Motto M.D. Hedwig Morton. Juanda Chance M.D. Venita Lick. Russella Dar M.D. Wilhemina Bonito. Marina Goodell M.D. Barbette Hair. Arlyce Dice M.D. Iva Boop M.D. Cheron Every.D.

## 2010-02-13 NOTE — Assessment & Plan Note (Signed)
Summary: 2 MO ROV /NWS $50   Vital Signs:  Patient Profile:   76 Years Old Female Weight:      183 pounds Temp:     97.7 degrees F oral Pulse rate:   68 / minute BP sitting:   140 / 100  (left arm)  Vitals Entered By: Tora Perches (October 22, 2007 8:08 AM)                 Chief Complaint:  Multiple medical problems or concerns.  History of Present Illness: The patient presents for a follow up of hypertension, CVA, hyperlipidemia     Current Allergies: No known allergies   Past Medical History:    Reviewed history from 05/07/2007 and no changes required:       Anxiety       Depression       GERD       Hyperlipidemia       Hypertension       Osteoporosis       Cerebrovascular accident, hx of       Thyroid cysts       Cholelithiasis       MR, mild       Memory loss       Gout 2009   Family History:    Reviewed history from 02/05/2007 and no changes required:       Family History Hypertension  Social History:    Reviewed history from 02/05/2007 and no changes required:       Retired       Married       Never Smoked    Review of Systems  The patient denies dyspnea on exertion and hemoptysis.     Physical Exam  General:     Well-developed,well-nourished,in no acute distress; alert,appropriate and cooperative throughout examination Head:     Normocephalic and atraumatic without obvious abnormalities. No apparent alopecia or balding. Nose:     External nasal examination shows no deformity or inflammation. Nasal mucosa are pink and moist without lesions or exudates. Mouth:     Oral mucosa and oropharynx without lesions or exudates.  Teeth in good repair. Neck:     No deformities, masses, or tenderness noted. Lungs:     Normal respiratory effort, chest expands symmetrically. Lungs are clear to auscultation, no crackles or wheezes. Heart:     Normal rate and regular rhythm. S1 and S2 normal without gallop, murmur, click, rub or other extra  sounds. Abdomen:     Bowel sounds positive,abdomen soft and non-tender without masses, organomegaly or hernias noted. Msk:     No deformity or scoliosis noted of thoracic or lumbar spine.   Neurologic:     No cranial nerve deficits noted. Station and gait are normal. Plantar reflexes are down-going bilaterally. DTRs are symmetrical throughout. Sensory, motor and coordinative functions appear intact. Skin:     Intact without suspicious lesions or rashes Psych:     Cognition and judgment appear intact. Alert and cooperative with normal attention span and concentration. No apparent delusions, illusions, hallucinationsnormally interactive, depressed affect, and tearful.      Impression & Recommendations:  Problem # 1:  HYPERTENSION (ICD-401.9) Assessment: Unchanged  Her updated medication list for this problem includes:    Enalapril Maleate 20 Mg Tabs (Enalapril maleate) .Marland Kitchen... Take 1 tab by mouth daily    Coreg 12.5 Mg Tabs (Carvedilol) .Marland Kitchen... Take 1 tab by mouth twice a day    Furosemide 20 Mg  Tabs (Furosemide) .Marland Kitchen... Take 1 tab by mouth every day   Problem # 2:  DEPRESSION (ICD-311) Assessment: Unchanged  Her updated medication list for this problem includes:    Fluoxetine Hcl 20 Mg Tabs (Fluoxetine hcl) .Marland Kitchen... Take 2 tab by mouth daily    Ativan 2 Mg Tabs (Lorazepam) .Marland Kitchen... 1 by mouth tid   Problem # 3:  MEMORY LOSS (ICD-780.93) Assessment: Unchanged  Complete Medication List: 1)  Fluoxetine Hcl 20 Mg Tabs (Fluoxetine hcl) .... Take 2 tab by mouth daily 2)  Enalapril Maleate 20 Mg Tabs (Enalapril maleate) .... Take 1 tab by mouth daily 3)  Coreg 12.5 Mg Tabs (Carvedilol) .... Take 1 tab by mouth twice a day 4)  Ranitidine Hcl 150 Mg Caps (Ranitidine hcl) .Marland Kitchen.. 1 po bid 5)  Aspir-low 81 Mg Tbec (Aspirin) .Marland Kitchen.. 1 bid  after meal 6)  Vitamin D3 1000 Unit Tabs (Cholecalciferol) .Marland Kitchen.. 1 qd 7)  Furosemide 20 Mg Tabs (Furosemide) .... Take 1 tab by mouth every day 8)  Potassium Chloride  Cr 10 Meq Tbcr (Potassium chloride) .Marland Kitchen.. 1 by mouth qd 9)  Ibuprofen 600 Mg Tabs (Ibuprofen) .Marland Kitchen.. 1 by mouth two times a day pc x 5 d, then as needed gout 10)  Balanced B-100 Tabs (B complex vitamins) .Marland Kitchen.. 1 by mouth qd 11)  Ativan 2 Mg Tabs (Lorazepam) .Marland Kitchen.. 1 by mouth tid 12)  Hydrocodone-acetaminophen 5-325 Mg Tabs (Hydrocodone-acetaminophen) .Marland Kitchen.. 1 by mouth qid as needed pain 13)  Nitroglycerin 0.4 Mg Subl (Nitroglycerin) .Marland Kitchen.. 1 sl prn  Other Orders: EMR Misc Charge Code [facility])   Patient Instructions: 1)  Please schedule a follow-up appointment in 4 months.   Prescriptions: NITROGLYCERIN 0.4 MG SUBL (NITROGLYCERIN) 1 sl prn  #25 x 3   Entered and Authorized by:   Tresa Garter MD   Signed by:   Tresa Garter MD on 10/22/2007   Method used:   Electronically to        [facility] [phone]* (retail)       [address] [place], [place]  [address]       Ph: [phone]       Fax: [phone]   RxID:   [id number]  ]

## 2010-02-13 NOTE — Assessment & Plan Note (Signed)
Summary: 3 MTH FU  D/T   STC   Vital Signs:  Patient profile:   76 year old female Height:      64 inches Weight:      162.75 pounds BMI:     28.04 O2 Sat:      97 % on Room air Temp:     97.8 degrees F oral Pulse rate:   68 / minute BP sitting:   150 / 98  (left arm) Cuff size:   regular  Vitals Entered By: Lucious Groves (June 26, 2009 8:02 AM)  O2 Flow:  Room air CC: 3 mo f/u--Discuss meds./kb Is Patient Diabetic? No Pain Assessment Patient in pain? no        CC:  3 mo f/u--Discuss meds./kb.  History of Present Illness: C/o episode of syncope or LOC during the flight to [place] a month ago. C/o cough from Enalapril.  Current Medications (verified): 1)  Fluoxetine Hcl 20 Mg  Tabs (Fluoxetine Hcl) .... Take 2 Tab By Mouth Daily 2)  Enalapril Maleate 20 Mg  Tabs (Enalapril Maleate) .... Take 1 Tab By Mouth Daily 3)  Furosemide 20 Mg Tabs (Furosemide) .... Take 1 Tab By Mouth Every Day Prn 4)  Potassium Chloride Cr 10 Meq  Tbcr (Potassium Chloride) .Marland Kitchen.. 1 By Mouth Once Daily Prn 5)  Balanced B-100   Tabs (B Complex Vitamins) .Marland Kitchen.. 1 By Mouth Qd 6)  Ativan 2 Mg  Tabs (Lorazepam) .Marland Kitchen.. 1 By Mouth Tid 7)  Nitroglycerin 0.4 Mg Subl (Nitroglycerin) .Marland Kitchen.. 1 Sl Prn 8)  Coreg 25 Mg Tabs (Carvedilol) .Marland Kitchen.. 1 By Mouth Two Times A Day For Blood Pressure 9)  Ranitidine Hcl 300 Mg Tabs (Ranitidine Hcl) .Marland Kitchen.. 1 By Mouth Once Daily For Indigestion 10)  Vitamin D3 1000 Unit  Tabs (Cholecalciferol) .... 2 Once Daily By Mouth 11)  Aspir-Low 81 Mg Tbec (Aspirin) .Marland Kitchen.. 1 Bid  After Meal  Allergies (verified): 1)  Morphine Sulfate (Morphine Sulfate)  Past History:  Past Medical History: Last updated: 03/17/2009 Anxiety Depression GERD Hyperlipidemia Hypertension Osteoporosis Cerebrovascular accident, hx of Thyroid cysts Cholelithiasis MR, mild Memory loss Gout 2009 Vit D def Vaso-vagal syncope/spells triggered by GS H pilori (+) 2011  Social History: Last updated:  02/05/2007 Retired Married Never Smoked  Review of Systems       The patient complains of prolonged cough.  The patient denies fever, chest pain, dyspnea on exertion, and abdominal pain.    Physical Exam  General:  Well-developed,well-nourished,in no acute distress; alert,appropriate and cooperative throughout examination Nose:  External nasal examination shows no deformity or inflammation. Nasal mucosa are pink and moist without lesions or exudates. Mouth:  Oral mucosa and oropharynx without lesions or exudates.  Teeth in good repair. Neck:  No deformities, masses, or tenderness noted. Lungs:  Normal respiratory effort, chest expands symmetrically. Lungs are clear to auscultation, no crackles or wheezes. Heart:  Normal rate and regular rhythm. S1 and S2 normal without gallop, murmur, click, rub or other extra sounds. Abdomen:  Bowel sounds positive,abdomen soft and non-tender without masses, organomegaly or hernias noted. Msk:  No deformity or scoliosis noted of thoracic or lumbar spine.   Extremities:  trace left pedal edema and trace right pedal edema.   Neurologic:  No cranial nerve deficits noted. Station and gait are normal. Plantar reflexes are down-going bilaterally. DTRs are symmetrical throughout. Sensory, motor and coordinative functions appear intact. Skin:  Intact without suspicious lesions or rashes Psych:  Oriented, normally interactive, good eye  contact, not agitated, and slightly anxious.     Impression & Recommendations:  Problem # 1:  SYNCOPE (ICD-780.2) Assessment New  She had an episode 1 months ago during flight  Problem # 2:  HYPERTENSION (ICD-401.9) Assessment: Unchanged  The following medications were removed from the medication list:    Enalapril Maleate 20 Mg Tabs (Enalapril maleate) .Marland Kitchen... Take 1 tab by mouth daily Her updated medication list for this problem includes:    Furosemide 20 Mg Tabs (Furosemide) .Marland Kitchen... Take 1 tab by mouth every day prn     Coreg 25 Mg Tabs (Carvedilol) .Marland Kitchen... 1 by mouth two times a day for blood pressure    Cozaar 100 Mg Tabs (Losartan potassium) .Marland Kitchen... 1 by mouth qd  BP today: 150/98 Prior BP: 160/80 (03/17/2009)  Labs Reviewed: K+: 4.1 (03/17/2009) Creat: : 0.8 (03/17/2009)   Chol: 178 (12/12/2008)   HDL: 34.80 (12/12/2008)   LDL: 114 (12/12/2008)   TG: 146.0 (12/12/2008)  Problem # 3:  ANXIETY (ICD-300.00) Assessment: Deteriorated  Her updated medication list for this problem includes:    Fluoxetine Hcl 20 Mg Tabs (Fluoxetine hcl) .Marland Kitchen... Take 2 tab by mouth daily    Ativan 2 Mg Tabs (Lorazepam) .Marland Kitchen... 1 by mouth tid  Problem # 4:  DEPRESSION (ICD-311) Assessment: Unchanged  Her updated medication list for this problem includes:    Fluoxetine Hcl 20 Mg Tabs (Fluoxetine hcl) .Marland Kitchen... Take 2 tab by mouth daily    Ativan 2 Mg Tabs (Lorazepam) .Marland Kitchen... 1 by mouth tid  Complete Medication List: 1)  Fluoxetine Hcl 20 Mg Tabs (Fluoxetine hcl) .... Take 2 tab by mouth daily 2)  Furosemide 20 Mg Tabs (Furosemide) .... Take 1 tab by mouth every day prn 3)  Potassium Chloride Cr 10 Meq Tbcr (Potassium chloride) .Marland Kitchen.. 1 by mouth once daily prn 4)  Balanced B-100 Tabs (B complex vitamins) .Marland Kitchen.. 1 by mouth qd 5)  Ativan 2 Mg Tabs (Lorazepam) .Marland Kitchen.. 1 by mouth tid 6)  Nitroglycerin 0.4 Mg Subl (Nitroglycerin) .Marland Kitchen.. 1 sl prn 7)  Coreg 25 Mg Tabs (Carvedilol) .Marland Kitchen.. 1 by mouth two times a day for blood pressure 8)  Ranitidine Hcl 300 Mg Tabs (Ranitidine hcl) .Marland Kitchen.. 1 by mouth once daily for indigestion 9)  Vitamin D3 1000 Unit Tabs (Cholecalciferol) .... 2 once daily by mouth 10)  Aspir-low 81 Mg Tbec (Aspirin) .Marland Kitchen.. 1 bid  after meal 11)  Cozaar 100 Mg Tabs (Losartan potassium) .Marland Kitchen.. 1 by mouth qd  Patient Instructions: 1)  Please schedule a follow-up appointment in 3 months. Prescriptions: COZAAR 100 MG TABS (LOSARTAN POTASSIUM) 1 by mouth qd  #90 x 3   Entered and Authorized by:   Tresa Garter MD   Signed by:   Tresa Garter MD on 06/26/2009   Method used:   Electronically to        [facility] [phone]* (retail)       [address] [place], [place]  [address]       Ph: [phone]       Fax: [phone]   RxID:   [id number] FUROSEMIDE 20 MG TABS (FUROSEMIDE) Take 1 tab by mouth every day prn  #90 x 1   Entered and Authorized by:   Tresa Garter MD   Signed by:   Tresa Garter MD on 06/26/2009   Method used:   Electronically to        [facility]  [address] [phone]* (retail)       [address] [place], [place]  [address]       Ph: [phone]       Fax: [phone]   RxID:   [id number] COZAAR 100 MG TABS (LOSARTAN POTASSIUM) 1 by mouth qd  #30 x 12   Entered and Authorized by:   Tresa Garter MD   Signed by:   Tresa Garter MD on 06/26/2009   Method used:   Electronically to        [facility] [phone]* (retail)       [address] [place], [place]  [address]       Ph: [phone]       Fax: [phone]   RxID:   [id number]

## 2010-02-15 NOTE — Progress Notes (Signed)
Summary: Critical Platelet count  Phone Note Other Incoming   Caller: [facility] lab Summary of Call: platelets are [id number] Initial call taken by: ?  Follow-up for Phone Call        Noted  Keep return office visit  Follow-up by: Tresa Garter MD,  January 02, 2010 5:32 PM

## 2010-02-15 NOTE — Assessment & Plan Note (Signed)
Summary: 3 MTH YEARLY--STC   Vital Signs:  Patient profile:   76 year old female Height:      64 inches Weight:      172 pounds BMI:     29.63 Temp:     98.1 degrees F oral Pulse rate:   76 / minute Pulse rhythm:   regular Resp:     16 per minute BP sitting:   148 / 100  (left arm) Cuff size:   regular  Vitals Entered By: Lanier Prude, Beverly Gust) (January 02, 2010 10:26 AM) CC: MWV Is Patient Diabetic? No Comments pt is not taking Ativan. pt needs Rf on Fluoxetine, Furosemide, Nitro, Coreg, Ranitidine and Cozaar   CC:  MWV.  History of Present Illness: The patient presents for a preventive health examination  Patient past medical history, social history, and family history reviewed in detail no significant changes.  Patient is physically active. Depression is negative and mood is fair. Hearing is normal, and able to perform activities of daily living. Risk of falling is moderate  due to infrequent syncopal spellsand home safety has been reviewed and is appropriate. Patient has normal height, weight, and visual acuity. Patient has been counseled on age-appropriate routine health concerns for screening and prevention. Education, counseling done.  Cognition is normal.  Preventive Screening-Counseling & Management  Alcohol-Tobacco     Alcohol drinks/day: <1     Alcohol Counseling: not indicated; patient does not drink     Smoking Status: never  Caffeine-Diet-Exercise     Caffeine Counseling: not indicated; caffeine use is not excessive or problematic     Diet Counseling: not indicated; diet is assessed to be healthy     Does Patient Exercise: yes     Type of exercise: walking     Exercise (avg: min/session): 30-60     Times/week: 7     Exercise Counseling: not indicated; exercise is adequate     Depression Counseling: not indicated; screening negative for depression  Hep-HIV-STD-Contraception     Hepatitis Risk: no risk noted     Sun Exposure-Excessive:  no  Safety-Violence-Falls     Seat Belt Use: yes     Fall Risk Counseling: counseling provided; falls with injury noted      Sexual History:  currently monogamous.        Drug Use:  never.    Current Medications (verified): 1)  Fluoxetine Hcl 20 Mg  Tabs (Fluoxetine Hcl) .... Take 2 Tab By Mouth Daily 2)  Furosemide 20 Mg Tabs (Furosemide) .... Take 1 Tab By Mouth Every Day Prn 3)  Potassium Chloride Cr 10 Meq  Tbcr (Potassium Chloride) .Marland Kitchen.. 1 By Mouth Once Daily Prn 4)  Balanced B-100   Tabs (B Complex Vitamins) .Marland Kitchen.. 1 By Mouth Qd 5)  Ativan 2 Mg  Tabs (Lorazepam) .Marland Kitchen.. 1 By Mouth Tid 6)  Nitroglycerin 0.4 Mg Subl (Nitroglycerin) .Marland Kitchen.. 1 Sl Prn 7)  Coreg 25 Mg Tabs (Carvedilol) .Marland Kitchen.. 1 By Mouth Two Times A Day For Blood Pressure 8)  Ranitidine Hcl 300 Mg Tabs (Ranitidine Hcl) .Marland Kitchen.. 1 By Mouth Once Daily For Indigestion 9)  Aspir-Low 81 Mg Tbec (Aspirin) .Marland Kitchen.. 1 Bid  After Meal 10)  Cozaar 100 Mg Tabs (Losartan Potassium) .Marland Kitchen.. 1 By Mouth Qd 11)  Vitamin B-12 1000 Mcg Subl (Cyanocobalamin) .Marland Kitchen.. 1 By Mouth Qd 12)  Vitamin D3 1000 Unit  Tabs (Cholecalciferol) .... 2 Once Daily By Mouth  Allergies (verified): 1)  Morphine Sulfate (Morphine Sulfate)  Past History:  Past Surgical History: Last updated: 12/12/2008 Cholecystectomy 2010  Family History: Last updated: 02/05/2007 Family History Hypertension  Social History: Last updated: 02/05/2007 Retired Married Never Smoked  Past Medical History: Anxiety Depression GERD Hyperlipidemia Hypertension Osteoporosis Cerebrovascular accident, hx of Thyroid cysts Cholelithiasis MR, mild Memory loss Gout 2009 Vit D def Vaso-vagal syncope/spells H pilori (+) 2011 Thrombocytopenia  Family History: Reviewed history from 02/05/2007 and no changes required. Family History Hypertension  Social History: Does Patient Exercise:  yes Hepatitis Risk:  no risk noted Sun Exposure-Excessive:  no Seat Belt Use:  yes Sexual History:   currently monogamous Drug Use:  never  Review of Systems       The patient complains of syncope.  The patient denies anorexia, fever, weight loss, weight gain, vision loss, decreased hearing, hoarseness, chest pain, dyspnea on exertion, peripheral edema, prolonged cough, headaches, hemoptysis, abdominal pain, melena, hematochezia, severe indigestion/heartburn, hematuria, incontinence, genital sores, muscle weakness, suspicious skin lesions, transient blindness, difficulty walking, depression, unusual weight change, abnormal bleeding, enlarged lymph nodes, angioedema, and testicular masses.    Physical Exam  General:  Well-developed,well-nourished,in no acute distress; alert,appropriate and cooperative throughout examination Head:  Normocephalic and atraumatic without obvious abnormalities. No apparent alopecia or balding. Eyes:  No corneal or conjunctival inflammation noted. EOMI. Perrla. Funduscopic exam benign, without hemorrhages, exudates or papilledema. Vision grossly normal. Ears:  External ear exam shows no significant lesions or deformities.  Otoscopic examination reveals clear canals, tympanic membranes are intact bilaterally without bulging, retraction, inflammation or discharge. Hearing is grossly normal bilaterally. Nose:  External nasal examination shows no deformity or inflammation. Nasal mucosa are pink and moist without lesions or exudates. Mouth:  Oral mucosa and oropharynx without lesions or exudates.  Teeth in good repair. Neck:  No deformities, masses, or tenderness noted. Lungs:  Normal respiratory effort, chest expands symmetrically. Lungs are clear to auscultation, no crackles or wheezes. Heart:  Normal rate and regular rhythm. S1 and S2 normal without gallop, murmur, click, rub or other extra sounds. Abdomen:  Bowel sounds positive,abdomen soft and non-tender without masses, organomegaly or hernias noted. Msk:  No deformity or scoliosis noted of thoracic or lumbar spine.     Pulses:  R and L carotid,radial,femoral,dorsalis pedis and posterior tibial pulses are full and equal bilaterally Extremities:  trace left pedal edema and trace right pedal edema.   Neurologic:  No cranial nerve deficits noted. Station and gait are normal. Plantar reflexes are down-going bilaterally. DTRs are symmetrical throughout. Sensory, motor and coordinative functions appear intact. Skin:  Intact without suspicious lesions or rashes AK x 2 on nose 1 on lip and 2 on cheeks Cervical Nodes:  No lymphadenopathy noted Inguinal Nodes:  No significant adenopathy Psych:  Oriented, normally interactive, good eye contact, not agitated, and slightly anxious.     Impression & Recommendations:  Problem # 1:  HEALTH MAINTENANCE EXAM (ICD-V70.0) Assessment New  Overall doing well, age appropriate education and counseling updated and referral for appropriate preventive services done unless declined, immunizations up to date or declined, diet counseling done if overweight, urged to quit smoking if smokes, most recent labs reviewed and current ordered if appropriate, ecg reviewed or declined (interpretation per ECG scanned in the EMR if done); information regarding Medicare Preventation requirements given if appropriate.  Colon is up to date  Orders: Medicare -1st Annual Wellness Visit [phone]) TLB-BMP (Basic Metabolic Panel-BMET) (80048-METABOL) TLB-CBC Platelet - w/Differential (85025-CBCD) TLB-Hepatic/Liver Function Pnl (80076-HEPATIC) TLB-Lipid Panel (80061-LIPID) TLB-TSH (Thyroid Stimulating Hormone) (84443-TSH) TLB-Udip ONLY (81003-UDIP)  Problem # 2:  SYNCOPE (ICD-780.2) vaso - vagal Assessment: Comment Only Discussed  Problem # 3:  VITAMIN D DEFICIENCY (ICD-268.9) Assessment: Improved  Orders: T-Vitamin D (25-Hydroxy) (40981-19147)  Problem # 4:  DEPRESSION (ICD-311) Assessment: Improved  Her updated medication list for this problem includes:    Fluoxetine Hcl 20 Mg Tabs  (Fluoxetine hcl) .Marland Kitchen... Take 2 tab by mouth daily    Ativan 2 Mg Tabs (Lorazepam) .Marland Kitchen... 1 by mouth tid  Problem # 5:  THROMBOCYTOPENIA (ICD-287.5) ? etiol Assessment: Deteriorated Watching  Complete Medication List: 1)  Fluoxetine Hcl 20 Mg Tabs (Fluoxetine hcl) .... Take 2 tab by mouth daily 2)  Furosemide 20 Mg Tabs (Furosemide) .... Take 1 tab by mouth every day prn 3)  Potassium Chloride Cr 10 Meq Tbcr (Potassium chloride) .Marland Kitchen.. 1 by mouth once daily prn 4)  Ativan 2 Mg Tabs (Lorazepam) .Marland Kitchen.. 1 by mouth tid 5)  Nitroglycerin 0.4 Mg Subl (Nitroglycerin) .Marland Kitchen.. 1 sl prn 6)  Coreg 25 Mg Tabs (Carvedilol) .Marland Kitchen.. 1 by mouth two times a day for blood pressure 7)  Ranitidine Hcl 300 Mg Tabs (Ranitidine hcl) .Marland Kitchen.. 1 by mouth once daily for indigestion 8)  Aspir-low 81 Mg Tbec (Aspirin) .Marland Kitchen.. 1 bid  after meal 9)  Cozaar 100 Mg Tabs (Losartan potassium) .Marland Kitchen.. 1 by mouth qd 10)  Vitamin B-12 1000 Mcg Subl (Cyanocobalamin) .Marland Kitchen.. 1 by mouth qd 11)  Vitamin D3 1000 Unit Tabs (Cholecalciferol) .... 2 once daily by mouth  Other Orders: TD Toxoids IM 7 YR + (82956) Admin 1st Vaccine (21308) TLB-B12 + Folate Pnl (65784_69629-B28/UXL)  Patient Instructions: 1)  Please schedule a follow-up appointment in 3 months. Prescriptions: ATIVAN 2 MG  TABS (LORAZEPAM) 1 by mouth tid  #90 x 6   Entered and Authorized by:   Tresa Garter MD   Signed by:   Tresa Garter MD on 01/02/2010   Method used:   Print then Give to Patient   RxID:   [id number] ASPIR-LOW 81 MG TBEC (ASPIRIN) 1 bid  after meal  #100 x 3   Entered and Authorized by:   Tresa Garter MD   Signed by:   Tresa Garter MD on 01/02/2010   Method used:   Electronically to        [facility] [phone]* (retail)       [address] [place], [place]  [address]       Ph: [phone]       Fax: [phone]   RxID:   [id number] VITAMIN D3 1000 UNIT  TABS (CHOLECALCIFEROL) 2 once daily by mouth  #100 x 3   Entered  and Authorized by:   Tresa Garter MD   Signed by:   Tresa Garter MD on 01/02/2010   Method used:   Electronically to        [facility] [phone]* (retail)       [address] [place], [place]  [address]       Ph: [phone]       Fax: [phone]   RxID:   [id number] VITAMIN B-12 1000 MCG SUBL (CYANOCOBALAMIN) 1 by mouth qd  #100 x 3   Entered and Authorized by:   Tresa Garter MD   Signed by:   Tresa Garter MD on 01/02/2010   Method used:   Electronically to  [facility] [address] [phone]* (retail)       [address] [place], [place]  [address]       Ph: [phone]       Fax: [phone]   RxID:   [id number] COZAAR 100 MG TABS (LOSARTAN POTASSIUM) 1 by mouth qd  #90 x 3   Entered and Authorized by:   Tresa Garter MD   Signed by:   Tresa Garter MD on 01/02/2010   Method used:   Electronically to        [facility] [phone]* (retail)       [address] [place], [place]  [address]       Ph: [phone]       Fax: [phone]   RxID:   [id number] RANITIDINE HCL 300 MG TABS (RANITIDINE HCL) 1 by mouth once daily for indigestion  #90 x 3   Entered and Authorized by:   Tresa Garter MD   Signed by:   Tresa Garter MD on 01/02/2010   Method used:   Electronically to        [facility] [phone]* (retail)       [address] [place], [place]  [address]       Ph: [phone]       Fax: [phone]   RxID:   [id number] COREG 25 MG TABS (CARVEDILOL) 1 by mouth two times a day for blood pressure  #180 x 3   Entered and Authorized by:   Tresa Garter MD   Signed by:   Tresa Garter MD on 01/02/2010   Method used:   Electronically to        [facility] [phone]* (retail)       [address] [place], [place]  [address]       Ph: [phone]       Fax: [phone]   RxID:   [id number] NITROGLYCERIN 0.4 MG SUBL (NITROGLYCERIN) 1 sl prn  #25 x 3   Entered and  Authorized by:   Tresa Garter MD   Signed by:   Tresa Garter MD on 01/02/2010   Method used:   Electronically to        [facility] [phone]* (retail)       [address] [place], [place]  [address]       Ph: [phone]       Fax: [phone]   RxID:   [id number] POTASSIUM CHLORIDE CR 10 MEQ  TBCR (POTASSIUM CHLORIDE) 1 by mouth once daily prn  #90 x 1   Entered and Authorized by:   Tresa Garter MD   Signed by:   Tresa Garter MD on 01/02/2010   Method used:   Electronically to        [facility] [phone]* (retail)       [address] [place], [place]  [address]       Ph: [phone]       Fax: [phone]   RxID:   [id number] FUROSEMIDE 20 MG TABS (FUROSEMIDE) Take 1 tab by mouth every day prn  #90 x 1   Entered and Authorized by:   Tresa Garter MD   Signed by:  Tresa Garter MD on 01/02/2010   Method used:   Electronically to        [facility] [phone]* (retail)       [address] [place], [place]  [address]       Ph: [phone]       Fax: [phone]   RxID:   [id number] FLUOXETINE HCL 20 MG  TABS (FLUOXETINE HCL) Take 2 tab by mouth daily  #180 x 3   Entered and Authorized by:   Tresa Garter MD   Signed by:   Tresa Garter MD on 01/02/2010   Method used:   Electronically to        [facility] [phone]* (retail)       [address] [place], [place]  [address]       Ph: [phone]       Fax: [phone]   RxID:   [id number]    Orders Added: 1)  T-Vitamin D (25-Hydroxy) [phone] 2)  Medicare -1st Annual Wellness Visit [G0438] 3)  TD Toxoids IM 7 YR + [90714] 4)  Admin 1st Vaccine [90471] 5)  TLB-BMP (Basic Metabolic Panel-BMET) [80048-METABOL] 6)  TLB-CBC Platelet - w/Differential [85025-CBCD] 7)  TLB-Hepatic/Liver Function Pnl [80076-HEPATIC] 8)  TLB-Lipid Panel [80061-LIPID] 9)  TLB-TSH (Thyroid Stimulating Hormone) [84443-TSH] 10)  TLB-Udip ONLY  [81003-UDIP] 11)  TLB-B12 + Folate Pnl [82746_82607-B12/FOL]   Immunizations Administered:  Tetanus Vaccine:    Vaccine Type: Td    Site: left deltoid    Mfr: Sanofi Pasteur    Dose: 0.5 ml    Route: IM    Given by: Lanier Prude, CMA(AAMA)    Exp. Date: 02/15/2011    Lot #: [id number]    VIS given: 12/02/07 version given January 02, 2010.   Immunizations Administered:  Tetanus Vaccine:    Vaccine Type: Td    Site: left deltoid    Mfr: Sanofi Pasteur    Dose: 0.5 ml    Route: IM    Given by: Lanier Prude, CMA(AAMA)    Exp. Date: 02/15/2011    Lot #: [id number]    VIS given: 12/02/07 version given January 02, 2010.

## 2010-04-09 ENCOUNTER — Ambulatory Visit (INDEPENDENT_AMBULATORY_CARE_PROVIDER_SITE_OTHER): Payer: Medicare Other | Admitting: Internal Medicine

## 2010-04-09 ENCOUNTER — Other Ambulatory Visit (INDEPENDENT_AMBULATORY_CARE_PROVIDER_SITE_OTHER): Payer: Medicare Other

## 2010-04-09 ENCOUNTER — Encounter: Payer: Self-pay | Admitting: Internal Medicine

## 2010-04-09 DIAGNOSIS — T148XXA Other injury of unspecified body region, initial encounter: Secondary | ICD-10-CM

## 2010-04-09 DIAGNOSIS — D696 Thrombocytopenia, unspecified: Secondary | ICD-10-CM

## 2010-04-09 DIAGNOSIS — R269 Unspecified abnormalities of gait and mobility: Secondary | ICD-10-CM

## 2010-04-09 DIAGNOSIS — I10 Essential (primary) hypertension: Secondary | ICD-10-CM

## 2010-04-09 DIAGNOSIS — R413 Other amnesia: Secondary | ICD-10-CM

## 2010-04-09 DIAGNOSIS — R42 Dizziness and giddiness: Secondary | ICD-10-CM

## 2010-04-09 DIAGNOSIS — R209 Unspecified disturbances of skin sensation: Secondary | ICD-10-CM

## 2010-04-09 DIAGNOSIS — R202 Paresthesia of skin: Secondary | ICD-10-CM

## 2010-04-09 LAB — CBC WITH DIFFERENTIAL/PLATELET
Basophils Absolute: 0 10*3/uL (ref 0.0–0.1)
Basophils Relative: 0.8 % (ref 0.0–3.0)
Eosinophils Absolute: 0 10*3/uL (ref 0.0–0.7)
Eosinophils Relative: 0.7 % (ref 0.0–5.0)
HCT: 40.1 % (ref 36.0–46.0)
Hemoglobin: 14 g/dL (ref 12.0–15.0)
Lymphocytes Relative: 22.4 % (ref 12.0–46.0)
Lymphs Abs: 1.4 10*3/uL (ref 0.7–4.0)
MCHC: 34.8 g/dL (ref 30.0–36.0)
MCV: 92.4 fl (ref 78.0–100.0)
Monocytes Absolute: 0.5 10*3/uL (ref 0.1–1.0)
Monocytes Relative: 7.4 % (ref 3.0–12.0)
Neutro Abs: 4.2 10*3/uL (ref 1.4–7.7)
Neutrophils Relative %: 68.7 % (ref 43.0–77.0)
Platelets: 60 10*3/uL — ABNORMAL LOW (ref 150.0–400.0)
RBC: 4.34 Mil/uL (ref 3.87–5.11)
RDW: 12.4 % (ref 11.5–14.6)
WBC: 6.2 10*3/uL (ref 4.5–10.5)

## 2010-04-09 LAB — HEPATIC FUNCTION PANEL
ALT: 16 U/L (ref 0–35)
AST: 19 U/L (ref 0–37)
Albumin: 4.1 g/dL (ref 3.5–5.2)
Alkaline Phosphatase: 58 U/L (ref 39–117)
Bilirubin, Direct: 0.1 mg/dL (ref 0.0–0.3)
Total Bilirubin: 0.7 mg/dL (ref 0.3–1.2)
Total Protein: 6.9 g/dL (ref 6.0–8.3)

## 2010-04-09 LAB — URINALYSIS
Bilirubin Urine: NEGATIVE
Hgb urine dipstick: NEGATIVE
Ketones, ur: NEGATIVE
Leukocytes, UA: NEGATIVE
Nitrite: NEGATIVE
Specific Gravity, Urine: 1.01 (ref 1.000–1.030)
Total Protein, Urine: NEGATIVE
Urine Glucose: NEGATIVE
Urobilinogen, UA: 1 (ref 0.0–1.0)
pH: 7 (ref 5.0–8.0)

## 2010-04-09 LAB — VITAMIN B12: Vitamin B-12: 400 pg/mL (ref 211–911)

## 2010-04-09 LAB — TSH: TSH: 2.01 u[IU]/mL (ref 0.35–5.50)

## 2010-04-09 LAB — SEDIMENTATION RATE: Sed Rate: 7 mm/hr (ref 0–22)

## 2010-04-09 MED ORDER — MECLIZINE HCL 12.5 MG PO TABS: 12.5000 mg | ORAL_TABLET | Freq: Four times a day (QID) | ORAL | Status: AC | PRN

## 2010-04-09 MED ORDER — AMLODIPINE BESYLATE 5 MG PO TABS: 5.0000 mg | ORAL_TABLET | Freq: Every day | ORAL | Status: DC

## 2010-04-09 NOTE — Assessment & Plan Note (Signed)
No change 

## 2010-04-09 NOTE — Assessment & Plan Note (Signed)
Worse. Compliance discussed - not taking meds regular. Add Amlodine. Cont other meds.

## 2010-04-09 NOTE — Assessment & Plan Note (Signed)
Benign Positional Vertigo symptoms on the left. Start Meclizine. Start Brandt - Daroff exercise several times a day as dirrected. 

## 2010-04-09 NOTE — Progress Notes (Signed)
  Subjective:    Patient ID: Alice Wong, female    DOB: [birth year], 76 y.o.   MRN: [id number]  HPI   C/o HA x wks C/o dizziness and falls to L x 2-3 mo, severe at times The patient presents for a follow-up visit to check on  CAD, hyperlipidemia, HTN C/o bruising on arms and legs worse over past several months  Review of Systems  Constitutional: Negative for fever, activity change and appetite change.  HENT: Positive for tinnitus. Negative for hearing loss, nosebleeds, congestion, sore throat, facial swelling, rhinorrhea, mouth sores, trouble swallowing, neck stiffness and sinus pressure.   Eyes: Negative for pain, itching and visual disturbance.  Respiratory: Negative for cough, chest tightness, shortness of breath and wheezing.   Cardiovascular: Negative for palpitations.  Gastrointestinal: Negative for nausea, diarrhea, constipation, blood in stool, abdominal distention, anal bleeding and rectal pain.  Genitourinary: Negative for dysuria, urgency, frequency, hematuria, vaginal bleeding, genital sores and pelvic pain.  Musculoskeletal: Negative for back pain and gait problem.  Skin: Negative.  Negative for rash.  Neurological: Positive for dizziness, weakness, light-headedness and headaches. Negative for seizures, syncope, speech difficulty and numbness.  Hematological: Negative for adenopathy. Does not bruise/bleed easily.  Psychiatric/Behavioral: Negative for suicidal ideas, behavioral problems, sleep disturbance, dysphoric mood and decreased concentration. The patient is nervous/anxious.        Objective:   Physical Exam  Constitutional: She appears well-developed and well-nourished. No distress.  HENT:  Head: Normocephalic.  Right Ear: External ear normal.  Left Ear: External ear normal.  Nose: Nose normal.  Mouth/Throat: Oropharynx is clear and moist.  Eyes: Conjunctivae are normal. Pupils are equal, round, and reactive to light. Right eye exhibits no discharge. Left eye  exhibits no discharge.  Neck: Normal range of motion. Neck supple. No JVD present. No tracheal deviation present. No thyromegaly present.  Cardiovascular: Normal rate, regular rhythm and normal heart sounds.   Pulmonary/Chest: No stridor. No respiratory distress. She has no wheezes.  Abdominal: Soft. Bowel sounds are normal. She exhibits no distension and no mass. There is no tenderness. There is no rebound and no guarding.  Musculoskeletal: She exhibits no edema and no tenderness.  Lymphadenopathy:    She has no cervical adenopathy.  Neurological: She displays normal reflexes. No cranial nerve deficit (R doublevision H-P pos on L). She exhibits normal muscle tone. Coordination normal.  Skin: No rash noted. No erythema.  Psychiatric: She has a normal mood and affect. Her behavior is normal. Judgment and thought content normal.          Assessment & Plan:  GAIT IMBALANCE Benign Positional Vertigo symptoms on the left. Start Meclizine. Start Francee Piccolo - Daroff exercise several times a day as dirrected.  HYPERTENSION Worse. Compliance discussed - not taking meds regular. Add Amlodine. Cont other meds.  MEMORY LOSS No change     Thrombocytopenia - worse. Possible ITP vs other. D/c ASA. Hem consult.  Lab Results  Component Value Date   WBC 6.2 04/09/2010   HGB 14.0 04/09/2010   HCT 40.1 04/09/2010   PLT 60.0* 04/09/2010   CHOL 172 01/02/2010   TRIG 131.0 01/02/2010   HDL 36.90* 01/02/2010   ALT 16 04/09/2010   AST 19 04/09/2010   NA 142 01/02/2010   K 4.2 01/02/2010   CL 107 01/02/2010   CREATININE 0.9 01/02/2010   BUN 14 01/02/2010   CO2 28 01/02/2010   TSH 2.01 04/09/2010   HGBA1C 5.6 06/27/2009

## 2010-04-09 NOTE — Patient Instructions (Addendum)
Benign Positional Vertigo symptoms on the left. Start Meclizine. Start Brandt - Daroff exercise several times a day as dirrected. 

## 2010-04-19 LAB — COMPREHENSIVE METABOLIC PANEL
ALT: 259 U/L — ABNORMAL HIGH (ref 0–35)
ALT: 280 U/L — ABNORMAL HIGH (ref 0–35)
ALT: 36 U/L — ABNORMAL HIGH (ref 0–35)
AST: 69 U/L — ABNORMAL HIGH (ref 0–37)
AST: 239 U/L — ABNORMAL HIGH (ref 0–37)
AST: 24 U/L (ref 0–37)
Albumin: 3 g/dL — ABNORMAL LOW (ref 3.5–5.2)
Albumin: 3.1 g/dL — ABNORMAL LOW (ref 3.5–5.2)
Albumin: 4.4 g/dL (ref 3.5–5.2)
Alkaline Phosphatase: 94 U/L (ref 39–117)
Alkaline Phosphatase: 57 U/L (ref 39–117)
Alkaline Phosphatase: 65 U/L (ref 39–117)
BUN: 5 mg/dL — ABNORMAL LOW (ref 6–23)
BUN: 19 mg/dL (ref 6–23)
BUN: 9 mg/dL (ref 6–23)
CO2: 30 mEq/L (ref 19–32)
CO2: 29 mEq/L (ref 19–32)
CO2: 31 mEq/L (ref 19–32)
Calcium: 8.8 mg/dL (ref 8.4–10.5)
Calcium: 10 mg/dL (ref 8.4–10.5)
Calcium: 8.8 mg/dL (ref 8.4–10.5)
Chloride: 99 mEq/L (ref 96–112)
Chloride: 103 mEq/L (ref 96–112)
Chloride: 105 mEq/L (ref 96–112)
Creatinine, Ser: 0.94 mg/dL (ref 0.4–1.2)
Creatinine, Ser: 0.98 mg/dL (ref 0.4–1.2)
Creatinine, Ser: 1.06 mg/dL (ref 0.4–1.2)
GFR calc Af Amer: >60 mL/min (ref >60)
GFR calc Af Amer: >60 mL/min (ref >60)
GFR calc Af Amer: >60 mL/min (ref >60)
GFR calc non Af Amer: 59 mL/min — ABNORMAL LOW (ref >60)
GFR calc non Af Amer: 51 mL/min — ABNORMAL LOW (ref >60)
GFR calc non Af Amer: 56 mL/min — ABNORMAL LOW (ref >60)
Glucose, Bld: 142 mg/dL — ABNORMAL HIGH (ref 70–99)
Glucose, Bld: 122 mg/dL — ABNORMAL HIGH (ref 70–99)
Glucose, Bld: 150 mg/dL — ABNORMAL HIGH (ref 70–99)
Potassium: 3.6 mEq/L (ref 3.5–5.1)
Potassium: 4 mEq/L (ref 3.5–5.1)
Potassium: 4.3 mEq/L (ref 3.5–5.1)
Sodium: 134 mEq/L — ABNORMAL LOW (ref 135–145)
Sodium: 140 mEq/L (ref 135–145)
Sodium: 142 mEq/L (ref 135–145)
Total Bilirubin: 1.6 mg/dL — ABNORMAL HIGH (ref 0.3–1.2)
Total Bilirubin: 0.8 mg/dL (ref 0.3–1.2)
Total Bilirubin: 1.2 mg/dL (ref 0.3–1.2)
Total Protein: 6.3 g/dL (ref 6.0–8.3)
Total Protein: 6.1 g/dL (ref 6.0–8.3)
Total Protein: 7.6 g/dL (ref 6.0–8.3)

## 2010-04-19 LAB — HEPATIC FUNCTION PANEL
ALT: 361 U/L — ABNORMAL HIGH (ref 0–35)
ALT: 367 U/L — ABNORMAL HIGH (ref 0–35)
ALT: 516 U/L — ABNORMAL HIGH (ref 0–35)
AST: 149 U/L — ABNORMAL HIGH (ref 0–37)
AST: 188 U/L — ABNORMAL HIGH (ref 0–37)
AST: 419 U/L — ABNORMAL HIGH (ref 0–37)
Albumin: 2.8 g/dL — ABNORMAL LOW (ref 3.5–5.2)
Albumin: 3.1 g/dL — ABNORMAL LOW (ref 3.5–5.2)
Albumin: 3.1 g/dL — ABNORMAL LOW (ref 3.5–5.2)
Alkaline Phosphatase: 103 U/L (ref 39–117)
Alkaline Phosphatase: 99 U/L (ref 39–117)
Alkaline Phosphatase: 99 U/L (ref 39–117)
Bilirubin, Direct: 0.9 mg/dL — ABNORMAL HIGH (ref 0.0–0.3)
Bilirubin, Direct: 1.1 mg/dL — ABNORMAL HIGH (ref 0.0–0.3)
Bilirubin, Direct: 2.7 mg/dL — ABNORMAL HIGH (ref 0.0–0.3)
Indirect Bilirubin: 1.1 mg/dL — ABNORMAL HIGH (ref 0.3–0.9)
Indirect Bilirubin: 1.1 mg/dL — ABNORMAL HIGH (ref 0.3–0.9)
Indirect Bilirubin: 1.6 mg/dL — ABNORMAL HIGH (ref 0.3–0.9)
Total Bilirubin: 2 mg/dL — ABNORMAL HIGH (ref 0.3–1.2)
Total Bilirubin: 2.2 mg/dL — ABNORMAL HIGH (ref 0.3–1.2)
Total Bilirubin: 4.3 mg/dL — ABNORMAL HIGH (ref 0.3–1.2)
Total Protein: 5.8 g/dL — ABNORMAL LOW (ref 6.0–8.3)
Total Protein: 6.1 g/dL (ref 6.0–8.3)
Total Protein: 6 g/dL (ref 6.0–8.3)

## 2010-04-19 LAB — CARDIAC PANEL(CRET KIN+CKTOT+MB+TROPI)
CK, MB: 0.3 ng/mL (ref 0.3–4.0)
CK, MB: 0.5 ng/mL (ref 0.3–4.0)
CK, MB: 0.5 ng/mL (ref 0.3–4.0)
CK, MB: 0.6 ng/mL (ref 0.3–4.0)
Relative Index: INVALID (ref 0.0–2.5)
Relative Index: INVALID (ref 0.0–2.5)
Relative Index: INVALID (ref 0.0–2.5)
Relative Index: INVALID (ref 0.0–2.5)
Total CK: 30 U/L (ref 7–177)
Total CK: 32 U/L (ref 7–177)
Total CK: 40 U/L (ref 7–177)
Total CK: 43 U/L (ref 7–177)
Troponin I: 0.01 ng/mL (ref 0.00–0.06)
Troponin I: 0.01 ng/mL (ref 0.00–0.06)
Troponin I: 0.01 ng/mL (ref 0.00–0.06)
Troponin I: 0.02 ng/mL (ref 0.00–0.06)

## 2010-04-19 LAB — CBC
HCT: 32.9 % — ABNORMAL LOW (ref 36.0–46.0)
HCT: 34 % — ABNORMAL LOW (ref 36.0–46.0)
HCT: 36.5 % (ref 36.0–46.0)
HCT: 43 % (ref 36.0–46.0)
Hemoglobin: 11.8 g/dL — ABNORMAL LOW (ref 12.0–15.0)
Hemoglobin: 12.2 g/dL (ref 12.0–15.0)
Hemoglobin: 12.9 g/dL (ref 12.0–15.0)
Hemoglobin: 14.9 g/dL (ref 12.0–15.0)
MCHC: 35.8 g/dL (ref 30.0–36.0)
MCHC: 35.9 g/dL (ref 30.0–36.0)
MCHC: 34.8 g/dL (ref 30.0–36.0)
MCHC: 35.3 g/dL (ref 30.0–36.0)
MCV: 92.6 fL (ref 78.0–100.0)
MCV: 93.5 fL (ref 78.0–100.0)
MCV: 93.1 fL (ref 78.0–100.0)
MCV: 93.9 fL (ref 78.0–100.0)
Platelets: 106 10*3/uL — ABNORMAL LOW (ref 150–400)
Platelets: 71 10*3/uL — ABNORMAL LOW (ref 150–400)
Platelets: 128 10*3/uL — ABNORMAL LOW (ref 150–400)
Platelets: 96 10*3/uL — ABNORMAL LOW (ref 150–400)
RBC: 3.52 MIL/uL — ABNORMAL LOW (ref 3.87–5.11)
RBC: 3.67 MIL/uL — ABNORMAL LOW (ref 3.87–5.11)
RBC: 3.92 MIL/uL (ref 3.87–5.11)
RBC: 4.58 MIL/uL (ref 3.87–5.11)
RDW: 12.2 % (ref 11.5–15.5)
RDW: 12.4 % (ref 11.5–15.5)
RDW: 12.6 % (ref 11.5–15.5)
RDW: 12.6 % (ref 11.5–15.5)
WBC: 5.9 10*3/uL (ref 4.0–10.5)
WBC: 7 10*3/uL (ref 4.0–10.5)
WBC: 7.5 10*3/uL (ref 4.0–10.5)
WBC: 9.8 10*3/uL (ref 4.0–10.5)

## 2010-04-19 LAB — DIFFERENTIAL
Basophils Absolute: 0 10*3/uL (ref 0.0–0.1)
Basophils Relative: 0 % (ref 0–1)
Eosinophils Absolute: 0 10*3/uL (ref 0.0–0.7)
Eosinophils Relative: 1 % (ref 0–5)
Lymphocytes Relative: 28 % (ref 12–46)
Lymphs Abs: 2.1 10*3/uL (ref 0.7–4.0)
Monocytes Absolute: 0.5 10*3/uL (ref 0.1–1.0)
Monocytes Relative: 7 % (ref 3–12)
Neutro Abs: 4.8 10*3/uL (ref 1.7–7.7)
Neutrophils Relative %: 65 % (ref 43–77)

## 2010-04-19 LAB — HEPARIN INDUCED THROMBOCYTOPENIA PNL
Heparin Induced Plt Ab: NEGATIVE
Patient O.D.: 0.115
Serotonin Release: 0 % release (ref <20)

## 2010-04-19 LAB — PROTIME-INR
INR: 1.09 (ref 0.00–1.49)
Prothrombin Time: 14 seconds (ref 11.6–15.2)

## 2010-04-19 LAB — LIPASE, BLOOD: Lipase: 53 U/L (ref 11–59)

## 2010-04-19 LAB — LACTATE DEHYDROGENASE: LDH: 163 U/L (ref 94–250)

## 2010-04-19 LAB — D-DIMER, QUANTITATIVE: D-Dimer, Quant: 3.01 ug/mL-FEU — ABNORMAL HIGH (ref 0.00–0.48)

## 2010-04-19 LAB — SEDIMENTATION RATE: Sed Rate: 50 mm/hr — ABNORMAL HIGH (ref 0–22)

## 2010-04-19 LAB — APTT: aPTT: 29 seconds (ref 24–37)

## 2010-04-19 LAB — TECHNOLOGIST SMEAR REVIEW

## 2010-04-19 LAB — HIGH SENSITIVITY CRP: CRP, High Sensitivity: 59.9 mg/L — ABNORMAL HIGH

## 2010-05-04 ENCOUNTER — Encounter (HOSPITAL_BASED_OUTPATIENT_CLINIC_OR_DEPARTMENT_OTHER): Payer: Medicare Other | Admitting: Oncology

## 2010-05-04 ENCOUNTER — Other Ambulatory Visit (HOSPITAL_COMMUNITY): Payer: Self-pay | Admitting: Oncology

## 2010-05-04 DIAGNOSIS — D696 Thrombocytopenia, unspecified: Secondary | ICD-10-CM

## 2010-05-04 LAB — CBC WITH DIFFERENTIAL/PLATELET
BASO%: 0.6 % (ref 0.0–2.0)
Basophils Absolute: 0 10*3/uL (ref 0.0–0.1)
EOS%: 1 % (ref 0.0–7.0)
Eosinophils Absolute: 0.1 10*3/uL (ref 0.0–0.5)
HCT: 37.4 % (ref 34.8–46.6)
HGB: 13.2 g/dL (ref 11.6–15.9)
LYMPH%: 29.9 % (ref 14.0–49.7)
MCH: 31.7 pg (ref 25.1–34.0)
MCHC: 35.3 g/dL (ref 31.5–36.0)
MCV: 89.6 fL (ref 79.5–101.0)
MONO#: 0.5 10*3/uL (ref 0.1–0.9)
MONO%: 7.5 % (ref 0.0–14.0)
NEUT#: 3.9 10*3/uL (ref 1.5–6.5)
NEUT%: 61 % (ref 38.4–76.8)
Platelets: 60 10*3/uL — ABNORMAL LOW (ref 145–400)
RBC: 4.18 10*6/uL (ref 3.70–5.45)
RDW: 12.1 % (ref 11.2–14.5)
WBC: 6.4 10*3/uL (ref 3.9–10.3)
lymph#: 1.9 10*3/uL (ref 0.9–3.3)

## 2010-05-04 LAB — CHCC SMEAR

## 2010-05-04 LAB — MORPHOLOGY
PLT EST: DECREASED
RBC Comments: NORMAL

## 2010-05-07 LAB — COMPREHENSIVE METABOLIC PANEL
ALT: 15 U/L (ref 0–35)
AST: 18 U/L (ref 0–37)
Albumin: 4.6 g/dL (ref 3.5–5.2)
Alkaline Phosphatase: 62 U/L (ref 39–117)
BUN: 12 mg/dL (ref 6–23)
CO2: 26 mEq/L (ref 19–32)
Calcium: 9.9 mg/dL (ref 8.4–10.5)
Chloride: 103 mEq/L (ref 96–112)
Creatinine, Ser: 0.97 mg/dL (ref 0.40–1.20)
Glucose, Bld: 94 mg/dL (ref 70–99)
Potassium: 4.4 mEq/L (ref 3.5–5.3)
Sodium: 140 mEq/L (ref 135–145)
Total Bilirubin: 0.5 mg/dL (ref 0.3–1.2)
Total Protein: 7.3 g/dL (ref 6.0–8.3)

## 2010-05-07 LAB — LACTATE DEHYDROGENASE: LDH: 155 U/L (ref 94–250)

## 2010-05-07 LAB — ANA: Anti Nuclear Antibody(ANA): NEGATIVE

## 2010-05-23 ENCOUNTER — Encounter: Payer: Self-pay | Admitting: Internal Medicine

## 2010-05-23 ENCOUNTER — Ambulatory Visit (INDEPENDENT_AMBULATORY_CARE_PROVIDER_SITE_OTHER): Payer: Medicare Other | Admitting: Internal Medicine

## 2010-05-23 DIAGNOSIS — L570 Actinic keratosis: Secondary | ICD-10-CM

## 2010-05-23 DIAGNOSIS — I10 Essential (primary) hypertension: Secondary | ICD-10-CM

## 2010-05-23 DIAGNOSIS — E785 Hyperlipidemia, unspecified: Secondary | ICD-10-CM

## 2010-05-23 NOTE — Assessment & Plan Note (Signed)
On Rx 

## 2010-05-23 NOTE — Progress Notes (Signed)
  Subjective:    Patient ID: Alice Wong, female    DOB: 1934/10/29, 76 y.o.   MRN: [id number]  HPI   C/o skin lesions on face F/u low plt, dyslipidemia and HTN   Review of Systems  Constitutional: Negative for chills.  HENT: Negative for sinus pressure.   Eyes: Negative for pain.  Gastrointestinal: Negative for blood in stool and abdominal distention.  Genitourinary: Negative for frequency.  Neurological: Negative for numbness.  Psychiatric/Behavioral: Negative for confusion. The patient is not nervous/anxious.        Objective:   Physical Exam  Constitutional: She appears well-developed and well-nourished. No distress.  HENT:  Head: Normocephalic.  Right Ear: External ear normal.  Left Ear: External ear normal.  Nose: Nose normal.  Mouth/Throat: Oropharynx is clear and moist.  Eyes: Conjunctivae are normal. Pupils are equal, round, and reactive to light. Right eye exhibits no discharge. Left eye exhibits no discharge.  Neck: Normal range of motion. Neck supple. No JVD present. No tracheal deviation present. No thyromegaly present.  Cardiovascular: Normal rate, regular rhythm and normal heart sounds.   Pulmonary/Chest: No stridor. No respiratory distress. She has no wheezes.  Abdominal: Soft. Bowel sounds are normal. She exhibits no distension and no mass. There is no tenderness. There is no rebound and no guarding.  Musculoskeletal: She exhibits no edema and no tenderness.  Lymphadenopathy:    She has no cervical adenopathy.  Neurological: She displays normal reflexes. No cranial nerve deficit. She exhibits normal muscle tone. Coordination normal.  Skin: No rash noted. No erythema.  Psychiatric: She has a normal mood and affect. Her behavior is normal. Judgment and thought content normal.          Assessment & Plan:  Actinic keratoses  She asked me to  Freeze them  AK x 2 on face   Procedure Note :     Procedure : Cryosurgery   Indication:   Actinic  keratosis(es) x3 face   Risks including unsuccessful procedure , bleeding, infection, bruising, scar, a need for a repeat  procedure and others were explained to the patient in detail as well as the benefits. Informed consent was obtained verbally.     lesion(s)  on    was/were treated with liquid nitrogen on a Q-tip in a usual fasion . Band-Aid was applied and antibiotic ointment was given for a later use.   Tolerated well. Complications none.   Postprocedure instructions :     Keep the wounds clean. You can wash them with liquid soap and water. Pat dry with gauze or a Kleenex tissue  Before applying antibiotic ointment and a Band-Aid.   You need to report immediately  if  any signs of infection develop.     HYPERTENSION On Rx  HYPERLIPIDEMIA On Rx

## 2010-05-23 NOTE — Assessment & Plan Note (Addendum)
AK x 2 on face   Procedure Note :     Procedure : Cryosurgery   Indication:   Actinic keratosis(es) x3 face   Risks including unsuccessful procedure , bleeding, infection, bruising, scar, a need for a repeat  procedure and others were explained to the patient in detail as well as the benefits. Informed consent was obtained verbally.     lesion(s)  on    was/were treated with liquid nitrogen on a Q-tip in a usual fasion . Band-Aid was applied and antibiotic ointment was given for a later use.   Tolerated well. Complications none.   Postprocedure instructions :     Keep the wounds clean. You can wash them with liquid soap and water. Pat dry with gauze or a Kleenex tissue  Before applying antibiotic ointment and a Band-Aid.   You need to report immediately  if  any signs of infection develop.

## 2010-05-24 ENCOUNTER — Encounter: Payer: Self-pay | Admitting: Internal Medicine

## 2010-05-31 ENCOUNTER — Other Ambulatory Visit (HOSPITAL_COMMUNITY): Payer: Self-pay | Admitting: Oncology

## 2010-05-31 ENCOUNTER — Encounter (HOSPITAL_BASED_OUTPATIENT_CLINIC_OR_DEPARTMENT_OTHER): Payer: Medicare Other | Admitting: Oncology

## 2010-05-31 DIAGNOSIS — D696 Thrombocytopenia, unspecified: Secondary | ICD-10-CM

## 2010-05-31 LAB — CBC WITH DIFFERENTIAL/PLATELET
BASO%: 0.4 % (ref 0.0–2.0)
Basophils Absolute: 0 10*3/uL (ref 0.0–0.1)
EOS%: 0.5 % (ref 0.0–7.0)
Eosinophils Absolute: 0 10*3/uL (ref 0.0–0.5)
HCT: 38.4 % (ref 34.8–46.6)
HGB: 13.6 g/dL (ref 11.6–15.9)
LYMPH%: 27.3 % (ref 14.0–49.7)
MCH: 31.7 pg (ref 25.1–34.0)
MCHC: 35.4 g/dL (ref 31.5–36.0)
MCV: 89.4 fL (ref 79.5–101.0)
MONO#: 0.4 10*3/uL (ref 0.1–0.9)
MONO%: 6.5 % (ref 0.0–14.0)
NEUT#: 4.3 10*3/uL (ref 1.5–6.5)
NEUT%: 65.3 % (ref 38.4–76.8)
Platelets: 46 10*3/uL — ABNORMAL LOW (ref 145–400)
RBC: 4.29 10*6/uL (ref 3.70–5.45)
RDW: 12.6 % (ref 11.2–14.5)
WBC: 6.5 10*3/uL (ref 3.9–10.3)
lymph#: 1.8 10*3/uL (ref 0.9–3.3)

## 2010-06-01 ENCOUNTER — Other Ambulatory Visit (HOSPITAL_COMMUNITY): Payer: Self-pay | Admitting: Oncology

## 2010-06-01 DIAGNOSIS — Q447 Other congenital malformations of liver: Secondary | ICD-10-CM

## 2010-06-01 DIAGNOSIS — Q8909 Congenital malformations of spleen: Secondary | ICD-10-CM

## 2010-06-01 DIAGNOSIS — Q4479 Other congenital malformations of liver: Secondary | ICD-10-CM

## 2010-06-01 NOTE — Assessment & Plan Note (Signed)
[facility]                           PRIMARY CARE OFFICE NOTE   NAME:Wong, Alice                        MRN:          [id number]  DATE:02/18/2006                            DOB:          10-28-1934    The patient is a 76 year old female who presents with her friend without  an appointment and is asking me to make her my patient and address her  problem with elevated blood pressure.  She is complaining of being  shaky, anxious, tremulous.  Her blood pressure has been going up and  down.   PAST MEDICAL HISTORY:  1. CVA.  2. Hypertension.   SOCIAL HISTORY:  She does not smoke or drink.  She is from [place].  Lives in [place] with her daughter.  She is retired.   FAMILY HISTORY:  Hypertension.  Stroke.   CURRENT MEDICATIONS:  1. Indapamide 2.5 mg daily.  2. Spironolactone 25 mg daily.  3. Enalapril 20 mg daily.   REVIEW OF SYSTEMS:  As above, shaky, poor coordination, bad memory,  depression, anxiety, vertigo, headaches.  Denies chest pain or syncope.   PHYSICAL EXAMINATION:  VITAL SIGNS:  Blood pressure 201/94.  Pulse 72.  Respirations 14.  GENERAL:  She is tense and anxious.  HEENT:  Moist mucosa.  NECK:  Supple.  LUNGS:  Clear.  HEART:  Regular.  ABDOMEN:  Soft, nontender.  LOWER EXTREMITIES:  Without edema.  Romberg test is positive.  Unable to  do a straight line, the balance is off.  There is tremor in the upper  extremities present.  NEURO:  Otherwise neuro exam is nonfocal.  She is anxious, depressed,  not suicidal.   ASSESSMENT AND PLAN:  1. Uncontrolled hypertension.  Increase spironolactone to 50 mg a day.      Increase enalapril to 40 mg a day.  Indapamide continue at 2.5 mg a      day.  2. Dysequilibrium, likely status post CVA, aggravated by high blood      pressure. Treat #1.  3. Anxiety.  Ativan 1 mg p.o. b.i.d. p.r.n.  4. Status post CVA.  Added aspirin 325 mg daily.  5. Memory problems.  Will start vitamin  B complex.  I will see her      back in two weeks.   She was seen at no charge.     Georgina Quint. Plotnikov, MD  Electronically Signed    AVP/MedQ  DD: 02/20/2006  DT: 02/20/2006  Job #: [id number]

## 2010-06-08 ENCOUNTER — Ambulatory Visit (HOSPITAL_COMMUNITY)
Admission: RE | Admit: 2010-06-08 | Discharge: 2010-06-08 | Disposition: A | Discharge status: Home / Self Care | Payer: Medicare Other | Source: Ambulatory Visit | Attending: Oncology | Admitting: Oncology

## 2010-06-08 DIAGNOSIS — Z9089 Acquired absence of other organs: Secondary | ICD-10-CM | POA: Insufficient documentation

## 2010-06-08 DIAGNOSIS — Q447 Other congenital malformations of liver: Secondary | ICD-10-CM

## 2010-06-08 DIAGNOSIS — Q8909 Congenital malformations of spleen: Secondary | ICD-10-CM

## 2010-06-08 DIAGNOSIS — D696 Thrombocytopenia, unspecified: Secondary | ICD-10-CM | POA: Insufficient documentation

## 2010-06-28 ENCOUNTER — Encounter (HOSPITAL_BASED_OUTPATIENT_CLINIC_OR_DEPARTMENT_OTHER): Payer: Medicare Other | Admitting: Oncology

## 2010-06-28 ENCOUNTER — Other Ambulatory Visit (HOSPITAL_COMMUNITY): Payer: Self-pay | Admitting: Oncology

## 2010-06-28 DIAGNOSIS — D696 Thrombocytopenia, unspecified: Secondary | ICD-10-CM

## 2010-06-28 LAB — CBC WITH DIFFERENTIAL/PLATELET
BASO%: 0.6 % (ref 0.0–2.0)
Basophils Absolute: 0 10*3/uL (ref 0.0–0.1)
EOS%: 0.5 % (ref 0.0–7.0)
Eosinophils Absolute: 0 10*3/uL (ref 0.0–0.5)
HCT: 36.7 % (ref 34.8–46.6)
HGB: 12.8 g/dL (ref 11.6–15.9)
LYMPH%: 29.9 % (ref 14.0–49.7)
MCH: 31.6 pg (ref 25.1–34.0)
MCHC: 35 g/dL (ref 31.5–36.0)
MCV: 90.4 fL (ref 79.5–101.0)
MONO#: 0.4 10*3/uL (ref 0.1–0.9)
MONO%: 6.9 % (ref 0.0–14.0)
NEUT#: 4 10*3/uL (ref 1.5–6.5)
NEUT%: 62.1 % (ref 38.4–76.8)
Platelets: 55 10*3/uL — ABNORMAL LOW (ref 145–400)
RBC: 4.06 10*6/uL (ref 3.70–5.45)
RDW: 12.3 % (ref 11.2–14.5)
WBC: 6.5 10*3/uL (ref 3.9–10.3)
lymph#: 2 10*3/uL (ref 0.9–3.3)

## 2010-07-30 ENCOUNTER — Encounter (HOSPITAL_BASED_OUTPATIENT_CLINIC_OR_DEPARTMENT_OTHER): Payer: Medicare Other | Admitting: Oncology

## 2010-07-30 ENCOUNTER — Other Ambulatory Visit (HOSPITAL_COMMUNITY): Payer: Self-pay | Admitting: Oncology

## 2010-07-30 DIAGNOSIS — D696 Thrombocytopenia, unspecified: Secondary | ICD-10-CM

## 2010-07-30 LAB — CBC WITH DIFFERENTIAL/PLATELET
BASO%: 0.2 % (ref 0.0–2.0)
Basophils Absolute: 0 10*3/uL (ref 0.0–0.1)
EOS%: 0.8 % (ref 0.0–7.0)
Eosinophils Absolute: 0.1 10*3/uL (ref 0.0–0.5)
HCT: 36.9 % (ref 34.8–46.6)
HGB: 12.8 g/dL (ref 11.6–15.9)
LYMPH%: 21.6 % (ref 14.0–49.7)
MCH: 31.5 pg (ref 25.1–34.0)
MCHC: 34.8 g/dL (ref 31.5–36.0)
MCV: 90.7 fL (ref 79.5–101.0)
MONO#: 0.4 10*3/uL (ref 0.1–0.9)
MONO%: 5.8 % (ref 0.0–14.0)
NEUT#: 5 10*3/uL (ref 1.5–6.5)
NEUT%: 71.6 % (ref 38.4–76.8)
Platelets: 49 10*3/uL — ABNORMAL LOW (ref 145–400)
RBC: 4.07 10*6/uL (ref 3.70–5.45)
RDW: 12.4 % (ref 11.2–14.5)
WBC: 7 10*3/uL (ref 3.9–10.3)
lymph#: 1.5 10*3/uL (ref 0.9–3.3)

## 2010-07-30 LAB — COMPREHENSIVE METABOLIC PANEL
ALT: 12 U/L (ref 0–35)
AST: 13 U/L (ref 0–37)
Albumin: 4.2 g/dL (ref 3.5–5.2)
Alkaline Phosphatase: 61 U/L (ref 39–117)
BUN: 20 mg/dL (ref 6–23)
CO2: 24 mEq/L (ref 19–32)
Calcium: 9.3 mg/dL (ref 8.4–10.5)
Chloride: 106 mEq/L (ref 96–112)
Creatinine, Ser: 1.05 mg/dL (ref 0.50–1.10)
Glucose, Bld: 184 mg/dL — ABNORMAL HIGH (ref 70–99)
Potassium: 4.3 mEq/L (ref 3.5–5.3)
Sodium: 140 mEq/L (ref 135–145)
Total Bilirubin: 0.5 mg/dL (ref 0.3–1.2)
Total Protein: 7 g/dL (ref 6.0–8.3)

## 2010-07-30 LAB — LACTATE DEHYDROGENASE: LDH: 121 U/L (ref 94–250)

## 2010-10-08 ENCOUNTER — Encounter (HOSPITAL_BASED_OUTPATIENT_CLINIC_OR_DEPARTMENT_OTHER): Payer: Medicare Other | Admitting: Oncology

## 2010-10-08 ENCOUNTER — Other Ambulatory Visit (HOSPITAL_COMMUNITY): Payer: Self-pay | Admitting: Oncology

## 2010-10-08 DIAGNOSIS — D696 Thrombocytopenia, unspecified: Secondary | ICD-10-CM

## 2010-10-08 LAB — CBC WITH DIFFERENTIAL/PLATELET
BASO%: 0.3 % (ref 0.0–2.0)
Basophils Absolute: 0 10*3/uL (ref 0.0–0.1)
EOS%: 0.9 % (ref 0.0–7.0)
Eosinophils Absolute: 0.1 10*3/uL (ref 0.0–0.5)
HCT: 37.1 % (ref 34.8–46.6)
HGB: 13.1 g/dL (ref 11.6–15.9)
LYMPH%: 28.7 % (ref 14.0–49.7)
MCH: 31.8 pg (ref 25.1–34.0)
MCHC: 35.2 g/dL (ref 31.5–36.0)
MCV: 90.2 fL (ref 79.5–101.0)
MONO#: 0.5 10*3/uL (ref 0.1–0.9)
MONO%: 7 % (ref 0.0–14.0)
NEUT#: 4.3 10*3/uL (ref 1.5–6.5)
NEUT%: 63.1 % (ref 38.4–76.8)
Platelets: 68 10*3/uL — ABNORMAL LOW (ref 145–400)
RBC: 4.12 10*6/uL (ref 3.70–5.45)
RDW: 12.6 % (ref 11.2–14.5)
WBC: 6.8 10*3/uL (ref 3.9–10.3)
lymph#: 2 10*3/uL (ref 0.9–3.3)

## 2010-10-23 ENCOUNTER — Other Ambulatory Visit (HOSPITAL_COMMUNITY): Payer: Self-pay | Admitting: Oncology

## 2010-10-23 ENCOUNTER — Encounter (HOSPITAL_BASED_OUTPATIENT_CLINIC_OR_DEPARTMENT_OTHER): Payer: Medicare Other | Admitting: Oncology

## 2010-10-23 DIAGNOSIS — D696 Thrombocytopenia, unspecified: Secondary | ICD-10-CM

## 2010-10-23 LAB — CBC WITH DIFFERENTIAL/PLATELET
BASO%: 0.5 % (ref 0.0–2.0)
Basophils Absolute: 0 10*3/uL (ref 0.0–0.1)
EOS%: 1 % (ref 0.0–7.0)
Eosinophils Absolute: 0.1 10*3/uL (ref 0.0–0.5)
HCT: 40.3 % (ref 34.8–46.6)
HGB: 14.1 g/dL (ref 11.6–15.9)
LYMPH%: 24.1 % (ref 14.0–49.7)
MCH: 31.5 pg (ref 25.1–34.0)
MCHC: 35 g/dL (ref 31.5–36.0)
MCV: 89.9 fL (ref 79.5–101.0)
MONO#: 0.5 10*3/uL (ref 0.1–0.9)
MONO%: 6.8 % (ref 0.0–14.0)
NEUT#: 5 10*3/uL (ref 1.5–6.5)
NEUT%: 67.6 % (ref 38.4–76.8)
Platelets: 56 10*3/uL — ABNORMAL LOW (ref 145–400)
RBC: 4.48 10*6/uL (ref 3.70–5.45)
RDW: 12.6 % (ref 11.2–14.5)
WBC: 7.4 10*3/uL (ref 3.9–10.3)
lymph#: 1.8 10*3/uL (ref 0.9–3.3)

## 2010-10-23 LAB — COMPREHENSIVE METABOLIC PANEL
ALT: 15 U/L (ref 0–35)
AST: 16 U/L (ref 0–37)
Albumin: 4.6 g/dL (ref 3.5–5.2)
Alkaline Phosphatase: 72 U/L (ref 39–117)
BUN: 19 mg/dL (ref 6–23)
CO2: 27 mEq/L (ref 19–32)
Calcium: 9.7 mg/dL (ref 8.4–10.5)
Chloride: 104 mEq/L (ref 96–112)
Creatinine, Ser: 0.96 mg/dL (ref 0.50–1.10)
Glucose, Bld: 90 mg/dL (ref 70–99)
Potassium: 4.4 mEq/L (ref 3.5–5.3)
Sodium: 139 mEq/L (ref 135–145)
Total Bilirubin: 0.4 mg/dL (ref 0.3–1.2)
Total Protein: 7.4 g/dL (ref 6.0–8.3)

## 2010-10-23 LAB — LACTATE DEHYDROGENASE: LDH: 145 U/L (ref 94–250)

## 2010-11-18 ENCOUNTER — Telehealth: Payer: Self-pay | Admitting: Oncology

## 2010-11-18 NOTE — Telephone Encounter (Signed)
S/w dtr olga re r/s 12/14 appt per robin + getting pt in for lb in nov. Per olga 12/14 appt ok due to she has made arrangements @ work to be able to bring pt. Confirmed 12/14 appt @ 9:15 am + gv olga lb appt for 11/12 @ 1 pm - time per olga. Translator for 12/14 appt already set. Per olga on for no translator for 11/12 lb appt.

## 2010-11-19 ENCOUNTER — Ambulatory Visit (INDEPENDENT_AMBULATORY_CARE_PROVIDER_SITE_OTHER): Payer: Medicare Other | Admitting: Internal Medicine

## 2010-11-19 ENCOUNTER — Encounter: Payer: Self-pay | Admitting: Internal Medicine

## 2010-11-19 ENCOUNTER — Ambulatory Visit: Payer: Medicare Other | Admitting: Internal Medicine

## 2010-11-19 VITALS — BP 170/106 | HR 76 | Temp 98.1°F | Resp 16 | Wt 169.0 lb

## 2010-11-19 DIAGNOSIS — Z8679 Personal history of other diseases of the circulatory system: Secondary | ICD-10-CM

## 2010-11-19 DIAGNOSIS — I10 Essential (primary) hypertension: Secondary | ICD-10-CM

## 2010-11-19 DIAGNOSIS — R202 Paresthesia of skin: Secondary | ICD-10-CM

## 2010-11-19 DIAGNOSIS — F329 Major depressive disorder, single episode, unspecified: Secondary | ICD-10-CM

## 2010-11-19 DIAGNOSIS — L570 Actinic keratosis: Secondary | ICD-10-CM

## 2010-11-19 DIAGNOSIS — D696 Thrombocytopenia, unspecified: Secondary | ICD-10-CM

## 2010-11-19 DIAGNOSIS — R413 Other amnesia: Secondary | ICD-10-CM

## 2010-11-19 DIAGNOSIS — G4700 Insomnia, unspecified: Secondary | ICD-10-CM

## 2010-11-19 DIAGNOSIS — R209 Unspecified disturbances of skin sensation: Secondary | ICD-10-CM

## 2010-11-19 DIAGNOSIS — F411 Generalized anxiety disorder: Secondary | ICD-10-CM

## 2010-11-19 MED ORDER — POTASSIUM CHLORIDE 10 MEQ PO TBCR: 10.0000 meq | EXTENDED_RELEASE_TABLET | Freq: Every day | ORAL | Status: DC

## 2010-11-19 MED ORDER — FLUOXETINE HCL 20 MG PO TABS: 20.0000 mg | ORAL_TABLET | Freq: Every day | ORAL | Status: DC

## 2010-11-19 MED ORDER — LORAZEPAM 2 MG PO TABS: 2.0000 mg | ORAL_TABLET | Freq: Three times a day (TID) | ORAL | Status: DC

## 2010-11-19 MED ORDER — CARVEDILOL 25 MG PO TABS: 25.0000 mg | ORAL_TABLET | Freq: Two times a day (BID) | ORAL | Status: DC

## 2010-11-19 MED ORDER — LOSARTAN POTASSIUM 100 MG PO TABS: 100.0000 mg | ORAL_TABLET | Freq: Every day | ORAL | Status: DC

## 2010-11-19 MED ORDER — FUROSEMIDE 20 MG PO TABS: 20.0000 mg | ORAL_TABLET | Freq: Every day | ORAL | Status: DC | PRN

## 2010-11-19 MED ORDER — DONEPEZIL HCL 5 MG PO TABS: 5.0000 mg | ORAL_TABLET | Freq: Every day | ORAL | Status: DC

## 2010-11-19 MED ORDER — AMLODIPINE BESYLATE 5 MG PO TABS: 5.0000 mg | ORAL_TABLET | Freq: Every day | ORAL | Status: DC

## 2010-11-19 NOTE — Assessment & Plan Note (Signed)
Risks associated with HTN treatment noncompliance were discussed. Compliance was encouraged.

## 2010-11-19 NOTE — Assessment & Plan Note (Signed)
See Cryo note 

## 2010-11-19 NOTE — Assessment & Plan Note (Signed)
Chronic Risks associated with treatment noncompliance were discussed. Compliance was encouraged. 

## 2010-11-19 NOTE — Assessment & Plan Note (Signed)
Restart meds.

## 2010-11-19 NOTE — Assessment & Plan Note (Signed)
F/u w/Hematology 

## 2010-11-19 NOTE — Assessment & Plan Note (Signed)
Continue with current prescription therapy as reflected on the Med list.  

## 2010-11-19 NOTE — Assessment & Plan Note (Signed)
Start Aricept 

## 2010-11-19 NOTE — Progress Notes (Signed)
Subjective:    Patient ID: Alice Wong, female    DOB: 19-Feb-1934, 76 y.o.   MRN: [id number]  HPI  C/o depression, insomnia - worse F/u HTN, anxiety, low plt She does not seem to be taking her meds as dirrected - taking her BP meds "prn only" when her BP is up; she stopped fluoxetine  Review of Systems  Constitutional: Negative for chills, activity change, appetite change, fatigue and unexpected weight change.  HENT: Negative for congestion, mouth sores and sinus pressure.   Eyes: Negative for visual disturbance.  Respiratory: Negative for cough and chest tightness.   Gastrointestinal: Negative for nausea and abdominal pain.  Genitourinary: Negative for frequency, difficulty urinating and vaginal pain.  Musculoskeletal: Negative for back pain and gait problem.  Skin: Negative for pallor and rash.  Neurological: Positive for headaches. Negative for dizziness, tremors, weakness and numbness.  Psychiatric/Behavioral: Positive for sleep disturbance and dysphoric mood. Negative for confusion. The patient is nervous/anxious.        Objective:   Physical Exam  Constitutional: She appears well-developed. No distress.       obese  HENT:  Head: Normocephalic.  Right Ear: External ear normal.  Left Ear: External ear normal.  Nose: Nose normal.  Mouth/Throat: Oropharynx is clear and moist.  Eyes: Conjunctivae are normal. Pupils are equal, round, and reactive to light. Right eye exhibits no discharge. Left eye exhibits no discharge.  Neck: Normal range of motion. Neck supple. No JVD present. No tracheal deviation present. No thyromegaly present.  Cardiovascular: Normal rate, regular rhythm and normal heart sounds.   Pulmonary/Chest: No stridor. No respiratory distress. She has no wheezes.  Abdominal: Soft. Bowel sounds are normal. She exhibits no distension and no mass. There is no tenderness. There is no rebound and no guarding.  Musculoskeletal: She exhibits no edema and no  tenderness.  Lymphadenopathy:    She has no cervical adenopathy.  Neurological: She displays normal reflexes. No cranial nerve deficit. She exhibits normal muscle tone. Coordination normal.  Skin: No rash noted. No erythema.  Psychiatric: Her behavior is normal. Her affect is labile. Thought content is paranoid (fears at night). She expresses impulsivity. She exhibits a depressed mood. She expresses no homicidal and no suicidal ideation.       tearful   AK on L cheek x1  BP Readings from Last 3 Encounters:  11/19/10 170/106  05/23/10 128/70  04/09/10 170/90   Lab Results  Component Value Date   WBC 6.2 04/09/2010   HGB 14.1 10/23/2010   HCT 40.3 10/23/2010   PLT 56* 10/23/2010   GLUCOSE 90 10/23/2010   CHOL 172 01/02/2010   TRIG 131.0 01/02/2010   HDL 36.90* 01/02/2010   LDLCALC 109* 01/02/2010   ALT 15 10/23/2010   AST 16 10/23/2010   NA 139 10/23/2010   K 4.4 10/23/2010   CL 104 10/23/2010   CREATININE 0.96 10/23/2010   BUN 19 10/23/2010   CO2 27 10/23/2010   TSH 2.01 04/09/2010   INR 1.09 10/29/2008   HGBA1C 5.6 06/27/2009    Procedure Note :     Procedure : Cryosurgery   Indication:   Actinic keratosis(es) x1 L cheek   Risks including unsuccessful procedure , bleeding, infection, bruising, scar, a need for a repeat  procedure and others were explained to the patient in detail as well as the benefits. Informed consent was obtained verbally.    1 lesion(s)  on   L cheek  was/were treated with liquid nitrogen  on a Q-tip in a usual fasion . Band-Aid was applied and antibiotic ointment was given for a later use.   Tolerated well. Complications none.   Postprocedure instructions :     Keep the wounds clean. You can wash them with liquid soap and water. Pat dry with gauze or a Kleenex tissue  Before applying antibiotic ointment and a Band-Aid.   You need to report immediately  if  any signs of infection develop.         Assessment & Plan:

## 2010-11-26 ENCOUNTER — Other Ambulatory Visit (HOSPITAL_BASED_OUTPATIENT_CLINIC_OR_DEPARTMENT_OTHER): Payer: Medicare Other | Admitting: Lab

## 2010-11-26 ENCOUNTER — Other Ambulatory Visit (HOSPITAL_COMMUNITY): Payer: Self-pay | Admitting: Oncology

## 2010-11-26 DIAGNOSIS — D696 Thrombocytopenia, unspecified: Secondary | ICD-10-CM

## 2010-11-26 LAB — CBC WITH DIFFERENTIAL/PLATELET
BASO%: 0.4 % (ref 0.0–2.0)
Basophils Absolute: 0 10*3/uL (ref 0.0–0.1)
EOS%: 1.1 % (ref 0.0–7.0)
Eosinophils Absolute: 0.1 10*3/uL (ref 0.0–0.5)
HCT: 35.2 % (ref 34.8–46.6)
HGB: 12.4 g/dL (ref 11.6–15.9)
LYMPH%: 28.3 % (ref 14.0–49.7)
MCH: 31.7 pg (ref 25.1–34.0)
MCHC: 35.2 g/dL (ref 31.5–36.0)
MCV: 90.1 fL (ref 79.5–101.0)
MONO#: 0.5 10*3/uL (ref 0.1–0.9)
MONO%: 8 % (ref 0.0–14.0)
NEUT#: 3.9 10*3/uL (ref 1.5–6.5)
NEUT%: 62.2 % (ref 38.4–76.8)
Platelets: 87 10*3/uL — ABNORMAL LOW (ref 145–400)
RBC: 3.9 10*6/uL (ref 3.70–5.45)
RDW: 12.6 % (ref 11.2–14.5)
WBC: 6.2 10*3/uL (ref 3.9–10.3)
lymph#: 1.8 10*3/uL (ref 0.9–3.3)

## 2010-12-03 ENCOUNTER — Other Ambulatory Visit (INDEPENDENT_AMBULATORY_CARE_PROVIDER_SITE_OTHER): Payer: Medicare Other

## 2010-12-03 DIAGNOSIS — R209 Unspecified disturbances of skin sensation: Secondary | ICD-10-CM

## 2010-12-03 DIAGNOSIS — R202 Paresthesia of skin: Secondary | ICD-10-CM

## 2010-12-03 DIAGNOSIS — I10 Essential (primary) hypertension: Secondary | ICD-10-CM

## 2010-12-03 LAB — BASIC METABOLIC PANEL
BUN: 17 mg/dL (ref 6–23)
CO2: 28 mEq/L (ref 19–32)
Calcium: 9 mg/dL (ref 8.4–10.5)
Chloride: 104 mEq/L (ref 96–112)
Creatinine, Ser: 0.9 mg/dL (ref 0.4–1.2)
GFR: 61.81 mL/min (ref >60.00)
Glucose, Bld: 125 mg/dL — ABNORMAL HIGH (ref 70–99)
Potassium: 4.2 mEq/L (ref 3.5–5.1)
Sodium: 141 mEq/L (ref 135–145)

## 2010-12-03 LAB — VITAMIN B12: Vitamin B-12: 282 pg/mL (ref 211–911)

## 2010-12-03 LAB — TSH: TSH: 4.02 u[IU]/mL (ref 0.35–5.50)

## 2010-12-07 ENCOUNTER — Ambulatory Visit: Payer: Medicare Other | Admitting: Internal Medicine

## 2010-12-28 ENCOUNTER — Ambulatory Visit (HOSPITAL_BASED_OUTPATIENT_CLINIC_OR_DEPARTMENT_OTHER): Payer: Medicare Other | Admitting: Oncology

## 2010-12-28 ENCOUNTER — Other Ambulatory Visit (HOSPITAL_COMMUNITY): Payer: Self-pay | Admitting: Oncology

## 2010-12-28 ENCOUNTER — Telehealth: Payer: Self-pay | Admitting: Oncology

## 2010-12-28 ENCOUNTER — Ambulatory Visit: Payer: Medicare Other | Admitting: Physician Assistant

## 2010-12-28 ENCOUNTER — Other Ambulatory Visit (HOSPITAL_BASED_OUTPATIENT_CLINIC_OR_DEPARTMENT_OTHER): Payer: Medicare Other | Admitting: Lab

## 2010-12-28 VITALS — BP 177/86 | HR 58 | Temp 97.0°F | Ht 64.75 in | Wt 174.8 lb

## 2010-12-28 DIAGNOSIS — D696 Thrombocytopenia, unspecified: Secondary | ICD-10-CM

## 2010-12-28 LAB — CBC WITH DIFFERENTIAL/PLATELET
BASO%: 0.4 % (ref 0.0–2.0)
Basophils Absolute: 0 10*3/uL (ref 0.0–0.1)
EOS%: 1 % (ref 0.0–7.0)
Eosinophils Absolute: 0.1 10*3/uL (ref 0.0–0.5)
HCT: 38.9 % (ref 34.8–46.6)
HGB: 13.7 g/dL (ref 11.6–15.9)
LYMPH%: 22.2 % (ref 14.0–49.7)
MCH: 31.7 pg (ref 25.1–34.0)
MCHC: 35.3 g/dL (ref 31.5–36.0)
MCV: 89.9 fL (ref 79.5–101.0)
MONO#: 0.5 10*3/uL (ref 0.1–0.9)
MONO%: 7.7 % (ref 0.0–14.0)
NEUT#: 4.7 10*3/uL (ref 1.5–6.5)
NEUT%: 68.7 % (ref 38.4–76.8)
Platelets: 47 10*3/uL — ABNORMAL LOW (ref 145–400)
RBC: 4.33 10*6/uL (ref 3.70–5.45)
RDW: 12.8 % (ref 11.2–14.5)
WBC: 6.9 10*3/uL (ref 3.9–10.3)
lymph#: 1.5 10*3/uL (ref 0.9–3.3)

## 2010-12-28 NOTE — Telephone Encounter (Signed)
Pt given appts for 2/4/6 2013,aom

## 2010-12-28 NOTE — Progress Notes (Signed)
CC:   Georgina Quint. Plotnikov, MD  HISTORY:  I am seeing Alice Wong for followup of her thrombocytopenia that is felt to be on an autoimmune basis.  The patient is accompanied by her daughter [name].  The patient speaks limited [place].  The patient was last seen here on 07/30/2010.  At that time her platelet count was 49,000.  We have been checking CBCs in the interval.  On 09/24 platelet count was 68,000.  On 10/23/2010  it was 56,000 and on 11/12 was 87,000. The patient apparently has had no bleeding or bruising problems.  It will be recalled that we 1st saw the patient on 05/04/2010 at which time her platelet count was 60,000.  ANA was negative.  An abdominal ultrasound showed normal size spleen measuring 241 mL with a liver that was borderline in size.  That ultrasound was done on 06/08/2010.  The patient has not had any hospitalizations.  Apparently she is now on Aricept.  She had been on aspirin and that has been discontinued.  PROBLEM LIST:  Reads as follows: 1. 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  Apparently     thrombocytopenia was 1st noted around October 2010 when the     platelet count at that time was 128,000.  We have elected not to do     a bone marrow at this time. 2. History of CVA 11 years ago. 3. Hypertension for over 30 years. 4. Episodes of confusion and memory loss possibly due to dementia. 5. Syncopal episodes, possibly vasovagal in nature. 6. Laparoscopic cholecystectomy 10/26/2008.  MEDICINES:  Are as follows:  Norvasc, Coreg, vitamin D, vitamin B12 1000 mcg sublingually, Aricept 5 mg daily, Prozac, Cozaar, Antivert, nitroglycerin as needed, potassium chloride.  It appears that the patient had been on aspirin, Lasix, Ativan and Zantac at 1 time and those are discontinued.  PHYSICAL EXAMINATION:  The patient is 76 years old, is in no acute distress, is generally well appearing.  Weight is 174 pounds 12.8 ounces.  Height 5 feet 4-3/4 inches.   Blood pressure 177/86 in the left arm.  Pulse is 58 and regular.  Other vital signs are normal.  There is no scleral icterus.  Mouth and pharynx are benign.  No peripheral adenopathy palpable.  Heart/lungs:  Normal.  Breasts:  Not examined. The patient apparently has not had a recent mammogram.  I recommended that she have a mammogram.  No axillary or inguinal adenopathy. Abdomen:  Benign with no organomegaly or masses palpable.  Extremities: No peripheral edema.  No petechiae or purpura.  Neurologic:  Grossly normal.  LABORATORY DATA:  Today white count 6.9, ANC 4.7, hemoglobin 13.7, hematocrit 38.9, platelets 47,000.  MCV and MCH were normal.  Previous inspection of the peripheral smear on 05/04/2010 failed to reveal any dyspoietic changes.  The platelets were felt to be normal to slightly enlarged.  No giant platelets were at that time.  Chemistries today are pending.  We have chemistries from 10/23/2010 that were normal including an LDH.  Vitamin B12 level from 12/03/2010 was 282 whereas on 04/09/2010 it was 400.  Vitamin D level from 01/02/2010 was 29.  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  Her platelet count has fluctuated.  For example on 01/25/2010  her platelet count was 87,000.  Today it is 47,000.  I had a lengthy discussion with the patient's daughter.  I explained that the patient does not appear to have a  cancer or any evidence of malignant disease.  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 We have elected not to do a bone marrow at this time.  Certainly if the patient's platelet count were decrease further and certainly if she were to have significant symptoms associated with her thrombocytopenia, then we will would probably want to investigate further with a bone marrow before starting the patient on therapy which at least initially would consist of prednisone.  At this point, the patient seems to be  doing reasonably well.  We will check CBCs every 2 months and plan to see the patient again in 6 months at which time we will check CBC and chemistries.    ______________________________ Samul Dada, M.D. DSM/MEDQ  D:  12/28/2010  T:  12/28/2010  Job:  [id number]

## 2010-12-28 NOTE — Progress Notes (Signed)
This office note has been dictated.  #[id number]

## 2011-01-30 ENCOUNTER — Telehealth: Payer: Self-pay | Admitting: Internal Medicine

## 2011-01-30 ENCOUNTER — Encounter: Payer: Self-pay | Admitting: Internal Medicine

## 2011-01-30 ENCOUNTER — Ambulatory Visit (INDEPENDENT_AMBULATORY_CARE_PROVIDER_SITE_OTHER)
Admission: RE | Admit: 2011-01-30 | Discharge: 2011-01-30 | Disposition: A | Discharge status: Home / Self Care | Payer: Medicare Other | Source: Ambulatory Visit | Attending: Internal Medicine | Admitting: Internal Medicine

## 2011-01-30 ENCOUNTER — Ambulatory Visit (INDEPENDENT_AMBULATORY_CARE_PROVIDER_SITE_OTHER): Payer: Medicare Other | Admitting: Internal Medicine

## 2011-01-30 VITALS — BP 180/100 | HR 72 | Temp 96.1°F | Resp 16 | Wt 178.0 lb

## 2011-01-30 DIAGNOSIS — M545 Low back pain, unspecified: Secondary | ICD-10-CM | POA: Insufficient documentation

## 2011-01-30 DIAGNOSIS — K219 Gastro-esophageal reflux disease without esophagitis: Secondary | ICD-10-CM

## 2011-01-30 DIAGNOSIS — I10 Essential (primary) hypertension: Secondary | ICD-10-CM

## 2011-01-30 DIAGNOSIS — F329 Major depressive disorder, single episode, unspecified: Secondary | ICD-10-CM

## 2011-01-30 DIAGNOSIS — Z0000 Encounter for general adult medical examination without abnormal findings: Secondary | ICD-10-CM

## 2011-01-30 DIAGNOSIS — Z1231 Encounter for screening mammogram for malignant neoplasm of breast: Secondary | ICD-10-CM

## 2011-01-30 MED ORDER — MELOXICAM 15 MG PO TABS: 15.0000 mg | ORAL_TABLET | Freq: Every day | ORAL | Status: DC | PRN

## 2011-01-30 MED ORDER — KETOROLAC TROMETHAMINE 60 MG/2ML IM SOLN
30.0000 mg | Freq: Once | INTRAMUSCULAR | Status: AC
  Administered 2011-01-30: 30 mg via INTRAMUSCULAR

## 2011-01-30 NOTE — Progress Notes (Signed)
  Subjective:    Patient ID: Alice Wong, female    DOB: 10-01-1934, 77 y.o.   MRN: [id number]  HPI The patient presents for a follow-up of  chronic hypertension, chronic dyslipidemia, type 2 diabetes controlled with medicines (when she takes them) C/o sever R LBP: fell off steps w/a vacume cleaner. No LOC     Review of Systems  Constitutional: Positive for fatigue. Negative for chills, activity change, appetite change and unexpected weight change.  HENT: Negative for congestion, mouth sores and sinus pressure.   Eyes: Negative for visual disturbance.  Respiratory: Negative for cough and chest tightness.   Gastrointestinal: Negative for nausea and abdominal pain.  Genitourinary: Negative for frequency, difficulty urinating and vaginal pain.  Musculoskeletal: Positive for back pain. Negative for gait problem.  Skin: Negative for pallor and rash.  Neurological: Negative for dizziness, tremors, weakness, numbness and headaches.  Psychiatric/Behavioral: Negative for suicidal ideas, confusion and sleep disturbance. The patient is nervous/anxious.        Objective:   Physical Exam  Constitutional: She appears well-developed. No distress.  HENT:  Head: Normocephalic.  Right Ear: External ear normal.  Left Ear: External ear normal.  Nose: Nose normal.  Mouth/Throat: Oropharynx is clear and moist.  Eyes: Conjunctivae are normal. Pupils are equal, round, and reactive to light. Right eye exhibits no discharge. Left eye exhibits no discharge.  Neck: Normal range of motion. Neck supple. No JVD present. No tracheal deviation present. No thyromegaly present.  Cardiovascular: Normal rate, regular rhythm and normal heart sounds.   Pulmonary/Chest: No stridor. No respiratory distress. She has no wheezes.  Abdominal: Soft. Bowel sounds are normal. She exhibits no distension and no mass. There is no tenderness. There is no rebound and no guarding.  Musculoskeletal: She exhibits tenderness (R  flank). She exhibits no edema.  Lymphadenopathy:    She has no cervical adenopathy.  Neurological: She displays normal reflexes. No cranial nerve deficit. She exhibits normal muscle tone. Coordination normal.  Skin: No rash noted. No erythema.  Psychiatric: She has a normal mood and affect. Her behavior is normal. Judgment and thought content normal.   Tearful Lab Results  Component Value Date   WBC 6.9 12/28/2010   HGB 13.7 12/28/2010   HCT 38.9 12/28/2010   PLT 47* 12/28/2010   GLUCOSE 125* 12/03/2010   CHOL 172 01/02/2010   TRIG 131.0 01/02/2010   HDL 36.90* 01/02/2010   LDLCALC 109* 01/02/2010   ALT 15 10/23/2010   AST 16 10/23/2010   NA 141 12/03/2010   K 4.2 12/03/2010   CL 104 12/03/2010   CREATININE 0.9 12/03/2010   BUN 17 12/03/2010   CO2 28 12/03/2010   TSH 4.02 12/03/2010   INR 1.09 10/29/2008   HGBA1C 5.6 06/27/2009         Assessment & Plan:

## 2011-01-30 NOTE — Assessment & Plan Note (Signed)
Xray Toradol im Meloxicam

## 2011-01-30 NOTE — Telephone Encounter (Signed)
Alice Wong, please, inform patient's dtr that her x ray did not show any fx. She is constipated - take a laxative. She has OA too Thx

## 2011-01-30 NOTE — Assessment & Plan Note (Signed)
Continue with current prescription therapy as reflected on the Med list.  

## 2011-01-30 NOTE — Telephone Encounter (Signed)
P/t's daughter informed

## 2011-01-30 NOTE — Assessment & Plan Note (Signed)
She did not take meds today Compliance discussed

## 2011-01-31 NOTE — Progress Notes (Signed)
Addended by: Janeal Holmes on: 01/31/2011 11:15 AM   Modules accepted: Orders

## 2011-02-04 ENCOUNTER — Ambulatory Visit: Payer: Medicare Other | Admitting: Internal Medicine

## 2011-02-20 ENCOUNTER — Ambulatory Visit
Admission: RE | Admit: 2011-02-20 | Discharge: 2011-02-20 | Disposition: A | Discharge status: Home / Self Care | Payer: Medicare Other | Source: Ambulatory Visit | Attending: Internal Medicine | Admitting: Internal Medicine

## 2011-02-20 ENCOUNTER — Ambulatory Visit: Admission: RE | Admit: 2011-02-20 | Payer: Medicare Other | Source: Ambulatory Visit

## 2011-02-20 ENCOUNTER — Other Ambulatory Visit: Payer: Self-pay | Admitting: Internal Medicine

## 2011-02-20 DIAGNOSIS — Z1231 Encounter for screening mammogram for malignant neoplasm of breast: Secondary | ICD-10-CM

## 2011-02-27 ENCOUNTER — Other Ambulatory Visit: Payer: Self-pay | Admitting: Internal Medicine

## 2011-02-27 DIAGNOSIS — R928 Other abnormal and inconclusive findings on diagnostic imaging of breast: Secondary | ICD-10-CM

## 2011-02-28 ENCOUNTER — Other Ambulatory Visit (HOSPITAL_BASED_OUTPATIENT_CLINIC_OR_DEPARTMENT_OTHER): Payer: Medicare Other | Admitting: Lab

## 2011-02-28 DIAGNOSIS — D696 Thrombocytopenia, unspecified: Secondary | ICD-10-CM

## 2011-02-28 LAB — CBC WITH DIFFERENTIAL/PLATELET
BASO%: 0.4 % (ref 0.0–2.0)
Basophils Absolute: 0 10*3/uL (ref 0.0–0.1)
EOS%: 1.3 % (ref 0.0–7.0)
Eosinophils Absolute: 0.1 10*3/uL (ref 0.0–0.5)
HCT: 37.8 % (ref 34.8–46.6)
HGB: 13.4 g/dL (ref 11.6–15.9)
LYMPH%: 26.3 % (ref 14.0–49.7)
MCH: 31.8 pg (ref 25.1–34.0)
MCHC: 35.5 g/dL (ref 31.5–36.0)
MCV: 89.7 fL (ref 79.5–101.0)
MONO#: 0.5 10*3/uL (ref 0.1–0.9)
MONO%: 6.9 % (ref 0.0–14.0)
NEUT#: 4.5 10*3/uL (ref 1.5–6.5)
NEUT%: 65.1 % (ref 38.4–76.8)
Platelets: 55 10*3/uL — ABNORMAL LOW (ref 145–400)
RBC: 4.22 10*6/uL (ref 3.70–5.45)
RDW: 12.2 % (ref 11.2–14.5)
WBC: 7 10*3/uL (ref 3.9–10.3)
lymph#: 1.8 10*3/uL (ref 0.9–3.3)

## 2011-03-07 ENCOUNTER — Ambulatory Visit
Admission: RE | Admit: 2011-03-07 | Discharge: 2011-03-07 | Disposition: A | Discharge status: Home / Self Care | Payer: Medicare Other | Source: Ambulatory Visit | Attending: Internal Medicine | Admitting: Internal Medicine

## 2011-03-07 DIAGNOSIS — R928 Other abnormal and inconclusive findings on diagnostic imaging of breast: Secondary | ICD-10-CM

## 2011-03-29 ENCOUNTER — Ambulatory Visit (INDEPENDENT_AMBULATORY_CARE_PROVIDER_SITE_OTHER): Payer: Medicare Other | Admitting: Internal Medicine

## 2011-03-29 ENCOUNTER — Encounter: Payer: Self-pay | Admitting: Internal Medicine

## 2011-03-29 ENCOUNTER — Ambulatory Visit (INDEPENDENT_AMBULATORY_CARE_PROVIDER_SITE_OTHER)
Admission: RE | Admit: 2011-03-29 | Discharge: 2011-03-29 | Disposition: A | Discharge status: Home / Self Care | Payer: Medicare Other | Source: Ambulatory Visit | Attending: Internal Medicine | Admitting: Internal Medicine

## 2011-03-29 VITALS — BP 150/100 | HR 76 | Temp 98.3°F | Resp 16 | Wt 179.0 lb

## 2011-03-29 DIAGNOSIS — F411 Generalized anxiety disorder: Secondary | ICD-10-CM

## 2011-03-29 DIAGNOSIS — J209 Acute bronchitis, unspecified: Secondary | ICD-10-CM

## 2011-03-29 DIAGNOSIS — R059 Cough, unspecified: Secondary | ICD-10-CM

## 2011-03-29 DIAGNOSIS — R05 Cough: Secondary | ICD-10-CM

## 2011-03-29 MED ORDER — AMOXICILLIN-POT CLAVULANATE 875-125 MG PO TABS: 1.0000 | ORAL_TABLET | Freq: Two times a day (BID) | ORAL | Status: AC

## 2011-03-29 MED ORDER — METHYLPREDNISOLONE ACETATE PF 80 MG/ML IJ SUSP
120.0000 mg | Freq: Once | INTRAMUSCULAR | Status: AC
  Administered 2011-03-29: 120 mg via INTRAMUSCULAR

## 2011-03-29 MED ORDER — CEFTRIAXONE SODIUM 1 G IJ SOLR
1.0000 g | Freq: Once | INTRAMUSCULAR | Status: AC
  Administered 2011-03-29: 1 g via INTRAMUSCULAR

## 2011-03-29 MED ORDER — PROMETHAZINE-CODEINE 6.25-10 MG/5ML PO SYRP: 5.0000 mL | ORAL_SOLUTION | ORAL | Status: AC | PRN

## 2011-03-29 NOTE — Patient Instructions (Signed)
Use over-the-counter  "cold" medicines  such as"Afrin" nasal spray for nasal congestion as directed instead. Use" Delsym" or" Robitussin" cough syrup varietis for cough.  You can use plain "Tylenol" or "Advil' for fever, chills and achyness.   

## 2011-03-29 NOTE — Progress Notes (Signed)
  Subjective:    Patient ID: Alice Wong, female    DOB: 01/04/1935, 77 y.o.   MRN: [id number]  HPI   HPI  C/o URI sx's x 15  days. C/o ST, cough, weakness. Not better with OTC medicines. Actually, the patient is getting worse. The patient did not sleep last night due to cough.  Review of Systems  Constitutional: Positive for fever, chills and fatigue.  HENT: Positive for congestion, rhinorrhea, sneezing and postnasal drip.   Eyes: Positive for photophobia and pain. Negative for discharge and visual disturbance.  Respiratory: Positive for cough and wheezing.   Positive for chest pain.  Gastrointestinal: Negative for vomiting, abdominal pain, diarrhea and abdominal distention.  Genitourinary: Negative for dysuria and difficulty urinating.  Skin: Negative for rash.  Neurological: Positive for dizziness, weakness and light-headedness.      Review of Systems     Objective:   Physical Exam  Constitutional: She appears well-developed. She appears distressed (tired).  HENT:  Head: Normocephalic.  Right Ear: External ear normal.  Left Ear: External ear normal.  Nose: Nose normal.  Mouth/Throat: Oropharynx is clear and moist.  Eyes: Conjunctivae are normal. Pupils are equal, round, and reactive to light. Right eye exhibits no discharge. Left eye exhibits no discharge.  Neck: Normal range of motion. Neck supple. No JVD present. No tracheal deviation present. No thyromegaly present.  Cardiovascular: Normal rate, regular rhythm and normal heart sounds.   Pulmonary/Chest: No stridor. No respiratory distress. She has no wheezes.  Abdominal: Soft. Bowel sounds are normal. She exhibits no distension and no mass. There is no tenderness. There is no rebound and no guarding.  Musculoskeletal: She exhibits no edema and no tenderness.  Lymphadenopathy:    She has no cervical adenopathy.  Neurological: She displays normal reflexes. No cranial nerve deficit. She exhibits normal muscle tone.  Coordination normal.  Skin: No rash noted. No erythema.  Psychiatric: She has a normal mood and affect. Her behavior is normal. Judgment and thought content normal.          Assessment & Plan:

## 2011-03-31 DIAGNOSIS — J209 Acute bronchitis, unspecified: Secondary | ICD-10-CM | POA: Insufficient documentation

## 2011-03-31 DIAGNOSIS — R059 Cough, unspecified: Secondary | ICD-10-CM | POA: Insufficient documentation

## 2011-03-31 DIAGNOSIS — R05 Cough: Secondary | ICD-10-CM | POA: Insufficient documentation

## 2011-03-31 NOTE — Assessment & Plan Note (Signed)
Rocephin 1 g im Depomedrol 120 mg im Start Augmentin Pro/cod

## 2011-03-31 NOTE — Assessment & Plan Note (Signed)
Rocephin 1 g im Depomedrol 120 mg im Start Augmentin Pro/cod  

## 2011-03-31 NOTE — Assessment & Plan Note (Signed)
Continue with current prescription therapy as reflected on the Med list.  

## 2011-04-01 ENCOUNTER — Telehealth: Payer: Self-pay | Admitting: *Deleted

## 2011-04-01 NOTE — Telephone Encounter (Signed)
Message copied by Merrilyn Puma on Mon Apr 01, 2011  3:50 PM ------      Message from: Janeal Holmes      Created: Sun Mar 31, 2011  9:40 PM       Misty Stanley, please, inform patient that all labs are OK      Thank you!

## 2011-04-01 NOTE — Telephone Encounter (Signed)
Left detailed mess informing pt of below.  

## 2011-04-03 ENCOUNTER — Telehealth: Payer: Self-pay | Admitting: *Deleted

## 2011-04-03 NOTE — Telephone Encounter (Signed)
The form was sent Thx

## 2011-04-03 NOTE — Telephone Encounter (Signed)
Called to see if form had been filled out and fax for transporation last week and also pt wants to have thyroid check because she feels like thyroid is enlarged and feels that "thyroid is suffocating her" especially at night.

## 2011-04-04 NOTE — Telephone Encounter (Signed)
Pt's caregiver informed form was sent.

## 2011-05-01 ENCOUNTER — Encounter: Payer: Self-pay | Admitting: Internal Medicine

## 2011-05-01 ENCOUNTER — Ambulatory Visit (INDEPENDENT_AMBULATORY_CARE_PROVIDER_SITE_OTHER): Payer: Medicare Other | Admitting: Internal Medicine

## 2011-05-01 ENCOUNTER — Other Ambulatory Visit (INDEPENDENT_AMBULATORY_CARE_PROVIDER_SITE_OTHER): Payer: Medicare Other

## 2011-05-01 VITALS — BP 180/100 | HR 80 | Temp 97.8°F | Resp 16 | Wt 176.0 lb

## 2011-05-01 DIAGNOSIS — F329 Major depressive disorder, single episode, unspecified: Secondary | ICD-10-CM

## 2011-05-01 DIAGNOSIS — F3289 Other specified depressive episodes: Secondary | ICD-10-CM

## 2011-05-01 DIAGNOSIS — R51 Headache: Secondary | ICD-10-CM

## 2011-05-01 DIAGNOSIS — R269 Unspecified abnormalities of gait and mobility: Secondary | ICD-10-CM

## 2011-05-01 DIAGNOSIS — E785 Hyperlipidemia, unspecified: Secondary | ICD-10-CM

## 2011-05-01 DIAGNOSIS — I10 Essential (primary) hypertension: Secondary | ICD-10-CM

## 2011-05-01 DIAGNOSIS — J019 Acute sinusitis, unspecified: Secondary | ICD-10-CM

## 2011-05-01 DIAGNOSIS — G44329 Chronic post-traumatic headache, not intractable: Secondary | ICD-10-CM | POA: Insufficient documentation

## 2011-05-01 DIAGNOSIS — R413 Other amnesia: Secondary | ICD-10-CM

## 2011-05-01 DIAGNOSIS — R42 Dizziness and giddiness: Secondary | ICD-10-CM

## 2011-05-01 LAB — URINALYSIS, ROUTINE W REFLEX MICROSCOPIC
Bilirubin Urine: NEGATIVE
Ketones, ur: NEGATIVE
Nitrite: NEGATIVE
Specific Gravity, Urine: 1.02 (ref 1.000–1.030)
Total Protein, Urine: NEGATIVE
Urine Glucose: NEGATIVE
Urobilinogen, UA: 0.2 (ref 0.0–1.0)
pH: 5.5 (ref 5.0–8.0)

## 2011-05-01 LAB — BASIC METABOLIC PANEL
BUN: 11 mg/dL (ref 6–23)
CO2: 27 mEq/L (ref 19–32)
Calcium: 9.3 mg/dL (ref 8.4–10.5)
Chloride: 107 mEq/L (ref 96–112)
Creatinine, Ser: 0.9 mg/dL (ref 0.4–1.2)
GFR: 65.76 mL/min (ref >60.00)
Glucose, Bld: 125 mg/dL — ABNORMAL HIGH (ref 70–99)
Potassium: 4.4 mEq/L (ref 3.5–5.1)
Sodium: 142 mEq/L (ref 135–145)

## 2011-05-01 LAB — VITAMIN B12: Vitamin B-12: 288 pg/mL (ref 211–911)

## 2011-05-01 LAB — SEDIMENTATION RATE: Sed Rate: 20 mm/hr (ref 0–22)

## 2011-05-01 LAB — TSH: TSH: 1.82 u[IU]/mL (ref 0.35–5.50)

## 2011-05-01 MED ORDER — AMOXICILLIN 500 MG PO CAPS: 1000.0000 mg | ORAL_CAPSULE | Freq: Two times a day (BID) | ORAL | Status: AC

## 2011-05-01 MED ORDER — TORSEMIDE 20 MG PO TABS: 20.0000 mg | ORAL_TABLET | Freq: Every day | ORAL | Status: DC

## 2011-05-01 MED ORDER — TRAZODONE HCL 50 MG PO TABS: ORAL_TABLET | ORAL | Status: DC

## 2011-05-01 NOTE — Assessment & Plan Note (Signed)
Continue with current prescription therapy as reflected on the Med list.  

## 2011-05-01 NOTE — Assessment & Plan Note (Signed)
Chronic Risks associated with treatment noncompliance were discussed. Compliance was encouraged. 

## 2011-05-01 NOTE — Assessment & Plan Note (Signed)
BPV- chronic Continue with current prescription therapy as reflected on the Med list.

## 2011-05-01 NOTE — Assessment & Plan Note (Signed)
Added trazodone at hs

## 2011-05-01 NOTE — Assessment & Plan Note (Signed)
Chronic Mild dementia

## 2011-05-01 NOTE — Progress Notes (Signed)
Patient ID: Alice Wong, female   DOB: 1934/02/01, 77 y.o.   MRN: [id number]  Subjective:    Patient ID: Alice Wong, female    DOB: Nov 10, 1934, 77 y.o.   MRN: [id number]  HPI C/o severe HA, eye pains all the time - nauseated, tinnitus, poor balance  The patient presents for a follow-up of  chronic hypertension, chronic dyslipidemia, type 2 diabetes controlled with medicines (when she takes them)  C/o sever R LBP: fell off steps w/a vacume cleaner. No LOC     Review of Systems  Constitutional: Positive for fatigue. Negative for chills, activity change, appetite change and unexpected weight change.  HENT: Negative for congestion, mouth sores and sinus pressure.   Eyes: Negative for visual disturbance.  Respiratory: Negative for cough and chest tightness.   Gastrointestinal: Negative for nausea and abdominal pain.  Genitourinary: Negative for frequency, difficulty urinating and vaginal pain.  Musculoskeletal: Positive for back pain. Negative for gait problem.  Skin: Negative for pallor and rash.  Neurological: Negative for dizziness, tremors, weakness, numbness and headaches.  Psychiatric/Behavioral: Negative for suicidal ideas, confusion and sleep disturbance. The patient is nervous/anxious.   nasal congestion     Objective:   Physical Exam  Constitutional: She appears well-developed. No distress.  HENT:  Head: Normocephalic.  Right Ear: External ear normal.  Left Ear: External ear normal.  Nose: Nose normal.  Mouth/Throat: Oropharynx is clear and moist.  Eyes: Conjunctivae are normal. Pupils are equal, round, and reactive to light. Right eye exhibits no discharge. Left eye exhibits no discharge.  Neck: Normal range of motion. Neck supple. No JVD present. No tracheal deviation present. No thyromegaly present.  Cardiovascular: Normal rate, regular rhythm and normal heart sounds.   Pulmonary/Chest: No stridor. No respiratory distress. She has no wheezes.  Abdominal: Soft.  Bowel sounds are normal. She exhibits no distension and no mass. There is no tenderness. There is no rebound and no guarding.  Musculoskeletal: She exhibits tenderness (R flank). She exhibits no edema.  Lymphadenopathy:    She has no cervical adenopathy.  Neurological: She displays normal reflexes. No cranial nerve deficit. She exhibits normal muscle tone. Coordination normal.  Skin: No rash noted. No erythema.  Psychiatric: She has a normal mood and affect. Her behavior is normal. Judgment and thought content normal.  Eryth mucosa Tearful Lab Results  Component Value Date   WBC 7.0 02/28/2011   HGB 13.4 02/28/2011   HCT 37.8 02/28/2011   PLT 55* 02/28/2011   GLUCOSE 125* 12/03/2010   CHOL 172 01/02/2010   TRIG 131.0 01/02/2010   HDL 36.90* 01/02/2010   LDLCALC 109* 01/02/2010   ALT 15 10/23/2010   AST 16 10/23/2010   NA 141 12/03/2010   K 4.2 12/03/2010   CL 104 12/03/2010   CREATININE 0.9 12/03/2010   BUN 17 12/03/2010   CO2 28 12/03/2010   TSH 4.02 12/03/2010   INR 1.09 10/29/2008   HGBA1C 5.6 06/27/2009         Assessment & Plan:

## 2011-05-02 ENCOUNTER — Other Ambulatory Visit (HOSPITAL_BASED_OUTPATIENT_CLINIC_OR_DEPARTMENT_OTHER): Payer: Medicare Other | Admitting: Lab

## 2011-05-02 ENCOUNTER — Telehealth: Payer: Self-pay | Admitting: Internal Medicine

## 2011-05-02 DIAGNOSIS — D696 Thrombocytopenia, unspecified: Secondary | ICD-10-CM

## 2011-05-02 LAB — CBC WITH DIFFERENTIAL/PLATELET
BASO%: 0.6 % (ref 0.0–2.0)
Basophils Absolute: 0 10*3/uL (ref 0.0–0.1)
EOS%: 1.1 % (ref 0.0–7.0)
Eosinophils Absolute: 0.1 10*3/uL (ref 0.0–0.5)
HCT: 35.6 % (ref 34.8–46.6)
HGB: 12.7 g/dL (ref 11.6–15.9)
LYMPH%: 33.6 % (ref 14.0–49.7)
MCH: 30.9 pg (ref 25.1–34.0)
MCHC: 35.7 g/dL (ref 31.5–36.0)
MCV: 86.6 fL (ref 79.5–101.0)
MONO#: 0.4 10*3/uL (ref 0.1–0.9)
MONO%: 7.8 % (ref 0.0–14.0)
NEUT#: 3.1 10*3/uL (ref 1.5–6.5)
NEUT%: 56.9 % (ref 38.4–76.8)
Platelets: 89 10*3/uL — ABNORMAL LOW (ref 145–400)
RBC: 4.11 10*6/uL (ref 3.70–5.45)
RDW: 12.9 % (ref 11.2–14.5)
WBC: 5.4 10*3/uL (ref 3.9–10.3)
lymph#: 1.8 10*3/uL (ref 0.9–3.3)
nRBC: 0 % (ref 0–0)

## 2011-05-02 NOTE — Telephone Encounter (Signed)
Alice Wong, please, inform patient"s dtr that all labs are normal except for low vit B12 - restart B12 sl Thx

## 2011-05-06 NOTE — Telephone Encounter (Signed)
P/t's daughter informed

## 2011-05-31 ENCOUNTER — Telehealth: Payer: Self-pay | Admitting: Oncology

## 2011-05-31 NOTE — Telephone Encounter (Signed)
called and changed 5/17 appt to 6/24 (to allow for tx pt)     aom

## 2011-06-04 ENCOUNTER — Ambulatory Visit (INDEPENDENT_AMBULATORY_CARE_PROVIDER_SITE_OTHER): Payer: Medicare Other | Admitting: Internal Medicine

## 2011-06-04 ENCOUNTER — Encounter: Payer: Self-pay | Admitting: Internal Medicine

## 2011-06-04 VITALS — BP 148/100 | HR 72 | Temp 97.5°F | Resp 16 | Wt 175.0 lb

## 2011-06-04 DIAGNOSIS — M545 Low back pain, unspecified: Secondary | ICD-10-CM

## 2011-06-04 DIAGNOSIS — F411 Generalized anxiety disorder: Secondary | ICD-10-CM

## 2011-06-04 DIAGNOSIS — R413 Other amnesia: Secondary | ICD-10-CM

## 2011-06-04 DIAGNOSIS — R269 Unspecified abnormalities of gait and mobility: Secondary | ICD-10-CM

## 2011-06-04 DIAGNOSIS — H9319 Tinnitus, unspecified ear: Secondary | ICD-10-CM | POA: Insufficient documentation

## 2011-06-04 DIAGNOSIS — F329 Major depressive disorder, single episode, unspecified: Secondary | ICD-10-CM

## 2011-06-04 DIAGNOSIS — F3289 Other specified depressive episodes: Secondary | ICD-10-CM

## 2011-06-04 DIAGNOSIS — E785 Hyperlipidemia, unspecified: Secondary | ICD-10-CM

## 2011-06-04 DIAGNOSIS — R51 Headache: Secondary | ICD-10-CM

## 2011-06-04 DIAGNOSIS — I10 Essential (primary) hypertension: Secondary | ICD-10-CM

## 2011-06-04 MED ORDER — GABAPENTIN 100 MG PO CAPS: ORAL_CAPSULE | ORAL | Status: DC

## 2011-06-04 NOTE — Assessment & Plan Note (Signed)
Continue with current prescription therapy as reflected on the Med list.  

## 2011-06-04 NOTE — Assessment & Plan Note (Signed)
Chronic  She declined statins
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
## 2011-06-04 NOTE — Assessment & Plan Note (Signed)
Chronic Mild dementia 
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
## 2011-06-13 ENCOUNTER — Telehealth: Payer: Self-pay | Admitting: Internal Medicine

## 2011-06-13 NOTE — Telephone Encounter (Signed)
Joice Lofts, RPh from [employer] in [place] is calling for clarification on order for Gabapentin 100 mgs- Written by Dr. Humberto Seals. She is needing correct dose. Is patient supposed to take 1 PO BID or 2 pills PO BID. Please call back.

## 2011-06-14 NOTE — Telephone Encounter (Signed)
1  Po bid Thx

## 2011-06-27 ENCOUNTER — Emergency Department (HOSPITAL_COMMUNITY)
Admission: EM | Admit: 2011-06-27 | Discharge: 2011-06-27 | Disposition: A | Discharge status: Home / Self Care | Payer: Medicare Other | Attending: Emergency Medicine | Admitting: Emergency Medicine

## 2011-06-27 DIAGNOSIS — F329 Major depressive disorder, single episode, unspecified: Secondary | ICD-10-CM | POA: Insufficient documentation

## 2011-06-27 DIAGNOSIS — M109 Gout, unspecified: Secondary | ICD-10-CM | POA: Insufficient documentation

## 2011-06-27 DIAGNOSIS — F3289 Other specified depressive episodes: Secondary | ICD-10-CM | POA: Insufficient documentation

## 2011-06-27 DIAGNOSIS — Z79899 Other long term (current) drug therapy: Secondary | ICD-10-CM | POA: Insufficient documentation

## 2011-06-27 DIAGNOSIS — Z8673 Personal history of transient ischemic attack (TIA), and cerebral infarction without residual deficits: Secondary | ICD-10-CM | POA: Insufficient documentation

## 2011-06-27 DIAGNOSIS — R51 Headache: Secondary | ICD-10-CM | POA: Insufficient documentation

## 2011-06-27 DIAGNOSIS — E785 Hyperlipidemia, unspecified: Secondary | ICD-10-CM | POA: Insufficient documentation

## 2011-06-27 DIAGNOSIS — F411 Generalized anxiety disorder: Secondary | ICD-10-CM | POA: Insufficient documentation

## 2011-06-27 DIAGNOSIS — M810 Age-related osteoporosis without current pathological fracture: Secondary | ICD-10-CM | POA: Insufficient documentation

## 2011-06-27 DIAGNOSIS — R002 Palpitations: Secondary | ICD-10-CM | POA: Insufficient documentation

## 2011-06-27 DIAGNOSIS — I10 Essential (primary) hypertension: Secondary | ICD-10-CM

## 2011-06-27 DIAGNOSIS — K219 Gastro-esophageal reflux disease without esophagitis: Secondary | ICD-10-CM | POA: Insufficient documentation

## 2011-06-27 DIAGNOSIS — I059 Rheumatic mitral valve disease, unspecified: Secondary | ICD-10-CM | POA: Insufficient documentation

## 2011-06-27 LAB — URINALYSIS, ROUTINE W REFLEX MICROSCOPIC
Bilirubin Urine: NEGATIVE
Glucose, UA: NEGATIVE mg/dL
Hgb urine dipstick: NEGATIVE
Ketones, ur: NEGATIVE mg/dL
Nitrite: NEGATIVE
Protein, ur: NEGATIVE mg/dL
Specific Gravity, Urine: 1.009 (ref 1.005–1.030)
Urobilinogen, UA: 0.2 mg/dL (ref 0.0–1.0)
pH: 7.5 (ref 5.0–8.0)

## 2011-06-27 LAB — URINE MICROSCOPIC-ADD ON

## 2011-06-27 LAB — POCT I-STAT, CHEM 8
BUN: 11 mg/dL (ref 6–23)
Calcium, Ion: 1.21 mmol/L (ref 1.12–1.32)
Chloride: 107 mEq/L (ref 96–112)
Creatinine, Ser: 0.9 mg/dL (ref 0.50–1.10)
Glucose, Bld: 129 mg/dL — ABNORMAL HIGH (ref 70–99)
HCT: 37 % (ref 36.0–46.0)
Hemoglobin: 12.6 g/dL (ref 12.0–15.0)
Potassium: 4 mEq/L (ref 3.5–5.1)
Sodium: 143 mEq/L (ref 135–145)
TCO2: 22 mmol/L (ref 0–100)

## 2011-06-27 MED ORDER — DEXAMETHASONE SODIUM PHOSPHATE 10 MG/ML IJ SOLN
10.0000 mg | Freq: Once | INTRAMUSCULAR | Status: AC
  Administered 2011-06-27: 10 mg via INTRAVENOUS
  Filled 2011-06-27: qty 1

## 2011-06-27 MED ORDER — DIPHENHYDRAMINE HCL 50 MG/ML IJ SOLN
25.0000 mg | Freq: Once | INTRAMUSCULAR | Status: AC
  Administered 2011-06-27: 25 mg via INTRAVENOUS
  Filled 2011-06-27: qty 1

## 2011-06-27 MED ORDER — METOCLOPRAMIDE HCL 5 MG/ML IJ SOLN
10.0000 mg | Freq: Once | INTRAMUSCULAR | Status: AC
  Administered 2011-06-27: 10 mg via INTRAVENOUS
  Filled 2011-06-27: qty 2

## 2011-06-27 NOTE — ED Notes (Signed)
Patient is resting comfortably.Pt is now calm, with eyes closed, napping....covered with warm blankets

## 2011-06-27 NOTE — ED Notes (Signed)
Pt called 911, [place] speaking and speaks no [place]...presents with Hypertension. Anxious, weeping, upset...Marland KitchenMarland Kitchen

## 2011-06-27 NOTE — ED Provider Notes (Signed)
History     CSN: [id number]  Arrival date & time 06/27/11  1037   First MD Initiated Contact with Patient 06/27/11 1117      Chief Complaint  Patient presents with  . Hypertension    (Consider location/radiation/quality/duration/timing/severity/associated sxs/prior treatment) Patient is a 76 y.o. female presenting with hypertension. The history is provided by the patient.  Hypertension This is a new problem. The current episode started today. Associated symptoms include headaches. Pertinent negatives include no abdominal pain, chest pain, congestion, fever, nausea or vomiting. Associated symptoms comments: She took her blood pressure at home and found it to be elevated on multiple measurements. Per her daughter who is at bedside interpreting, the patient became worried and anxious, became tearful and developed a headache. She reports this headache is her typical daily headache that is worse at times and mild at times. She denies chest pain. She reports she feels her heart is skipping beats but no pain, SOB, nausea. She denies visual changes. She states she is compliant with all of her medications. No recent illnesses or fever. .    Past Medical History  Diagnosis Date  . Anxiety   . Depression   . GERD (gastroesophageal reflux disease)   . Hyperlipidemia   . Hypertension   . Osteoporosis   . CVA (cerebral vascular accident)   . Thyroid cyst   . Cholelithiasis   . MR (mitral regurgitation)   . Memory loss   . Gout 2009  . Vitamin d deficiency   . H. pylori infection 2011  . Vaso-vagal reaction   . Thrombocytopenia     Past Surgical History  Procedure Date  . Cholecystectomy 2010  . Lasik     Family History  Problem Relation Age of Onset  . Hypertension Other   . Cancer Brother     stomach    History  Substance Use Topics  . Smoking status: Never Smoker   . Smokeless tobacco: Not on file  . Alcohol Use: No    OB History    Grav Para Term Preterm Abortions  TAB SAB Ect Mult Living                  Review of Systems  Constitutional: Negative for fever.  HENT: Negative for congestion.   Respiratory: Negative for shortness of breath.   Cardiovascular: Positive for palpitations. Negative for chest pain.  Gastrointestinal: Negative for nausea, vomiting and abdominal pain.  Genitourinary:       She is having some pressure with urination but no frequency or pain.  Neurological: Positive for headaches.  Psychiatric/Behavioral: The patient is nervous/anxious.     Allergies  Morphine sulfate  Home Medications   Current Outpatient Rx  Name Route Sig Dispense Refill  . CARVEDILOL 25 MG PO TABS Oral Take 1 tablet (25 mg total) by mouth 2 (two) times daily. For blood pressure 60 tablet 11  . CHOLECALCIFEROL 1000 UNITS PO TABS Oral Take 2,000 Units by mouth daily.      Marland Kitchen VITAMIN B-12 1000 MCG SL SUBL Sublingual Place 1 tablet under the tongue daily.      . DONEPEZIL HCL 5 MG PO TABS Oral Take 1 tablet (5 mg total) by mouth at bedtime. 30 tablet 11  . FLUOXETINE HCL 20 MG PO TABS Oral Take 1 tablet (20 mg total) by mouth daily. 30 tablet 11  . GABAPENTIN 100 MG PO CAPS  Use bid for ringing in the ears 60 capsule 5  . LOSARTAN  POTASSIUM 100 MG PO TABS Oral Take 1 tablet (100 mg total) by mouth daily. 30 tablet 11  . RANITIDINE HCL 300 MG PO CAPS Oral Take 300 mg by mouth daily as needed. For indigestion     . TORSEMIDE 20 MG PO TABS Oral Take 1 tablet (20 mg total) by mouth daily. 30 tablet 11  . NITROGLYCERIN 0.4 MG SL SUBL Sublingual Place 0.4 mg under the tongue as needed.      . TRAZODONE HCL 50 MG PO TABS  1 po qhs 30 tablet 5    BP 117/74  Pulse 60  Temp 98.6 F (37 C) (Oral)  Resp 16  SpO2 98%  Physical Exam  Constitutional: She is oriented to person, place, and time. She appears well-developed and well-nourished. No distress.  HENT:  Head: Normocephalic and atraumatic.  Neck: Normal range of motion.  Cardiovascular: Normal rate  and regular rhythm.   No murmur heard. Pulmonary/Chest: Effort normal and breath sounds normal. She has no wheezes. She has no rales. She exhibits no tenderness.  Abdominal: Soft. There is no tenderness. There is no rebound and no guarding.  Musculoskeletal: Normal range of motion. She exhibits no edema.  Neurological: She is alert and oriented to person, place, and time. She exhibits normal muscle tone. Coordination normal.       Cranial nerves 3-12 grossly intact.  Skin: Skin is warm and dry.  Psychiatric: She has a normal mood and affect.    ED Course  Procedures (including critical care time)   Labs Reviewed  URINALYSIS, ROUTINE W REFLEX MICROSCOPIC   Results for orders placed during the hospital encounter of 06/27/11  URINALYSIS, ROUTINE W REFLEX MICROSCOPIC      Component Value Range   Color, Urine YELLOW  YELLOW   APPearance CLEAR  CLEAR   Specific Gravity, Urine 1.009  1.005 - 1.030   pH 7.5  5.0 - 8.0   Glucose, UA NEGATIVE  NEGATIVE mg/dL   Hgb urine dipstick NEGATIVE  NEGATIVE   Bilirubin Urine NEGATIVE  NEGATIVE   Ketones, ur NEGATIVE  NEGATIVE mg/dL   Protein, ur NEGATIVE  NEGATIVE mg/dL   Urobilinogen, UA 0.2  0.0 - 1.0 mg/dL   Nitrite NEGATIVE  NEGATIVE   Leukocytes, UA SMALL (*) NEGATIVE  URINE MICROSCOPIC-ADD ON      Component Value Range   Squamous Epithelial / LPF FEW (*) RARE   WBC, UA 0-2  <3 WBC/hpf   Bacteria, UA RARE  RARE  POCT I-STAT, CHEM 8      Component Value Range   Sodium 143  135 - 145 mEq/L   Potassium 4.0  3.5 - 5.1 mEq/L   Chloride 107  96 - 112 mEq/L   BUN 11  6 - 23 mg/dL   Creatinine, Ser 4.09  0.50 - 1.10 mg/dL   Glucose, Bld 811 (*) 70 - 99 mg/dL   Calcium, Ion 9.14  7.82 - 1.32 mmol/L   TCO2 22  0 - 100 mmol/L   Hemoglobin 12.6  12.0 - 15.0 g/dL   HCT 95.6  21.3 - 08.6 %    No results found.   No diagnosis found. 1. Typical headache 2. Hypertension    MDM  The patient's headache is typical of her daily headaches  and is currently resolved. Blood pressure is improved, slightly elevated but not critical. She can be discharged home.         Rodena Medin, PA-C 06/27/11 1420

## 2011-06-27 NOTE — ED Notes (Signed)
Family at bedside. 

## 2011-06-27 NOTE — ED Notes (Signed)
EXB:[id number]<LK> Expected date:<BR> Expected time:<BR> Means of arrival:<BR> Comments:<BR> HA, HTN? Speaks r[place]ian

## 2011-06-27 NOTE — ED Notes (Signed)
Patient is resting comfortably.B/p now normaltensive

## 2011-06-27 NOTE — Discharge Instructions (Signed)
FOLLOW UP WITH YOUR DOCTOR FOR RECHECK THIS WEEK OR EARLY NEXT WEEK. RETURN HERE AS NEEDED.

## 2011-06-27 NOTE — ED Notes (Signed)
DAUGHTER ENROUTE-speaks ENGLISH, states she spoke with her Mother, the pt, this am and she was anxious.Marland KitchenMarland Kitchen

## 2011-06-27 NOTE — ED Provider Notes (Signed)
Medical screening examination/treatment/procedure(s) were performed by non-physician practitioner and as supervising physician I was immediately available for consultation/collaboration.   Reinhold Rickey, MD 06/27/11 1604 

## 2011-06-28 ENCOUNTER — Other Ambulatory Visit: Payer: Medicare Other | Admitting: Lab

## 2011-06-28 ENCOUNTER — Ambulatory Visit: Payer: Medicare Other | Admitting: Oncology

## 2011-07-01 ENCOUNTER — Emergency Department (HOSPITAL_COMMUNITY): Payer: Medicare Other

## 2011-07-01 ENCOUNTER — Emergency Department (HOSPITAL_COMMUNITY)
Admission: EM | Admit: 2011-07-01 | Discharge: 2011-07-01 | Disposition: A | Discharge status: Home / Self Care | Payer: Medicare Other | Attending: Emergency Medicine | Admitting: Emergency Medicine

## 2011-07-01 ENCOUNTER — Encounter (HOSPITAL_COMMUNITY): Payer: Self-pay | Admitting: *Deleted

## 2011-07-01 DIAGNOSIS — Z79899 Other long term (current) drug therapy: Secondary | ICD-10-CM | POA: Insufficient documentation

## 2011-07-01 DIAGNOSIS — E785 Hyperlipidemia, unspecified: Secondary | ICD-10-CM | POA: Insufficient documentation

## 2011-07-01 DIAGNOSIS — R10816 Epigastric abdominal tenderness: Secondary | ICD-10-CM | POA: Insufficient documentation

## 2011-07-01 DIAGNOSIS — R42 Dizziness and giddiness: Secondary | ICD-10-CM | POA: Insufficient documentation

## 2011-07-01 DIAGNOSIS — Z9089 Acquired absence of other organs: Secondary | ICD-10-CM | POA: Insufficient documentation

## 2011-07-01 DIAGNOSIS — R55 Syncope and collapse: Secondary | ICD-10-CM | POA: Insufficient documentation

## 2011-07-01 DIAGNOSIS — I10 Essential (primary) hypertension: Secondary | ICD-10-CM | POA: Insufficient documentation

## 2011-07-01 LAB — CBC
HCT: 36.7 % (ref 36.0–46.0)
Hemoglobin: 13.1 g/dL (ref 12.0–15.0)
MCH: 31.4 pg (ref 26.0–34.0)
MCHC: 35.7 g/dL (ref 30.0–36.0)
MCV: 88 fL (ref 78.0–100.0)
Platelets: 52 10*3/uL — ABNORMAL LOW (ref 150–400)
RBC: 4.17 MIL/uL (ref 3.87–5.11)
RDW: 12.5 % (ref 11.5–15.5)
WBC: 7.6 10*3/uL (ref 4.0–10.5)

## 2011-07-01 LAB — CARDIAC PANEL(CRET KIN+CKTOT+MB+TROPI)
CK, MB: 1.5 ng/mL (ref 0.3–4.0)
Relative Index: INVALID (ref 0.0–2.5)
Total CK: 41 U/L (ref 7–177)
Troponin I: <0.3 ng/mL (ref <0.30)

## 2011-07-01 LAB — URINALYSIS, ROUTINE W REFLEX MICROSCOPIC
Bilirubin Urine: NEGATIVE
Glucose, UA: NEGATIVE mg/dL
Hgb urine dipstick: NEGATIVE
Ketones, ur: NEGATIVE mg/dL
Nitrite: NEGATIVE
Protein, ur: NEGATIVE mg/dL
Specific Gravity, Urine: 1.013 (ref 1.005–1.030)
Urobilinogen, UA: 0.2 mg/dL (ref 0.0–1.0)
pH: 7 (ref 5.0–8.0)

## 2011-07-01 LAB — COMPREHENSIVE METABOLIC PANEL
ALT: 16 U/L (ref 0–35)
AST: 14 U/L (ref 0–37)
Albumin: 3.6 g/dL (ref 3.5–5.2)
Alkaline Phosphatase: 64 U/L (ref 39–117)
BUN: 26 mg/dL — ABNORMAL HIGH (ref 6–23)
CO2: 25 mEq/L (ref 19–32)
Calcium: 9.1 mg/dL (ref 8.4–10.5)
Chloride: 101 mEq/L (ref 96–112)
Creatinine, Ser: 1.11 mg/dL — ABNORMAL HIGH (ref 0.50–1.10)
GFR calc Af Amer: 55 mL/min — ABNORMAL LOW (ref >90)
GFR calc non Af Amer: 48 mL/min — ABNORMAL LOW (ref >90)
Glucose, Bld: 201 mg/dL — ABNORMAL HIGH (ref 70–99)
Potassium: 3.8 mEq/L (ref 3.5–5.1)
Sodium: 137 mEq/L (ref 135–145)
Total Bilirubin: 0.5 mg/dL (ref 0.3–1.2)
Total Protein: 6.6 g/dL (ref 6.0–8.3)

## 2011-07-01 LAB — DIFFERENTIAL
Basophils Absolute: 0 10*3/uL (ref 0.0–0.1)
Basophils Relative: 0 % (ref 0–1)
Eosinophils Absolute: 0.1 10*3/uL (ref 0.0–0.7)
Eosinophils Relative: 1 % (ref 0–5)
Lymphocytes Relative: 21 % (ref 12–46)
Lymphs Abs: 1.6 10*3/uL (ref 0.7–4.0)
Monocytes Absolute: 0.7 10*3/uL (ref 0.1–1.0)
Monocytes Relative: 9 % (ref 3–12)
Neutro Abs: 5.3 10*3/uL (ref 1.7–7.7)
Neutrophils Relative %: 69 % (ref 43–77)

## 2011-07-01 LAB — URINE MICROSCOPIC-ADD ON

## 2011-07-01 LAB — PROTIME-INR
INR: 1.03 (ref 0.00–1.49)
Prothrombin Time: 13.7 seconds (ref 11.6–15.2)

## 2011-07-01 NOTE — ED Notes (Addendum)
Per EMS- patient transported from senior [facility] on [facility]. Patient does not speak [place], patient speaks [place]. Patient's friend told EMS that the patient stood up and was dizzy and sat back down in the chair and EMS was called. BP 190/74 HR 60 Irregular unifocal PVC. CBG 202. Patient daughter translated that the patient had burning sensation throughout her chest area and feeling like she has sand in her eyes and this happened last week and the patient was seen at [facility]. Daughter returned to work in [place] and then will come to the hospital to translate for patient.

## 2011-07-01 NOTE — ED Notes (Signed)
Pt does not speak english. Translator phones will be used to communicate. Pt daughter on way to ED

## 2011-07-01 NOTE — ED Provider Notes (Signed)
History     CSN: [id number]  Arrival date & time 07/01/11  1341   First MD Initiated Contact with Patient 07/01/11 1354      Chief Complaint  Patient presents with  . Near Syncope    (Consider location/radiation/quality/duration/timing/severity/associated sxs/prior treatment) HPI Comments: Patient presents from senior living center with near syncopal episode and dizziness.  She became lightheaded with standing and hand to sit back down. Did not lose consciousness. Seen at [facility] last week for same.  Through translator, patient reports standing up from a class and feeling lightheaded and dizzy.  Her vision became dark and she went to lay down.  Denies LOC.  Back to baseline now.  States compliance with meds.  C/o gradual onset headache, dark vision has improved.  The history is provided by the patient and the EMS personnel. The history is limited by a language barrier.    Past Medical History  Diagnosis Date  . Anxiety   . Depression   . GERD (gastroesophageal reflux disease)   . Hyperlipidemia   . Hypertension   . Osteoporosis   . CVA (cerebral vascular accident)   . Thyroid cyst   . Cholelithiasis   . MR (mitral regurgitation)   . Memory loss   . Gout 2009  . Vitamin d deficiency   . H. pylori infection 2011  . Vaso-vagal reaction   . Thrombocytopenia     Past Surgical History  Procedure Date  . Cholecystectomy 2010  . Lasik     Family History  Problem Relation Age of Onset  . Hypertension Other   . Cancer Brother     stomach    History  Substance Use Topics  . Smoking status: Never Smoker   . Smokeless tobacco: Not on file  . Alcohol Use: No    OB History    Grav Para Term Preterm Abortions TAB SAB Ect Mult Living                  Review of Systems  Unable to perform ROS: Other    Allergies  Morphine sulfate  Home Medications   Current Outpatient Rx  Name Route Sig Dispense Refill  . CARVEDILOL 25 MG PO TABS Oral Take 1 tablet (25 mg total)  by mouth 2 (two) times daily. For blood pressure 60 tablet 11  . CHOLECALCIFEROL 1000 UNITS PO TABS Oral Take 2,000 Units by mouth daily.      Marland Kitchen VITAMIN B-12 1000 MCG SL SUBL Sublingual Place 1 tablet under the tongue daily.      . DONEPEZIL HCL 5 MG PO TABS Oral Take 1 tablet (5 mg total) by mouth at bedtime. 30 tablet 11  . FLUOXETINE HCL 20 MG PO TABS Oral Take 1 tablet (20 mg total) by mouth daily. 30 tablet 11  . GABAPENTIN 100 MG PO CAPS  Use bid for ringing in the ears 60 capsule 5  . LOSARTAN POTASSIUM 100 MG PO TABS Oral Take 1 tablet (100 mg total) by mouth daily. 30 tablet 11  . NITROGLYCERIN 0.4 MG SL SUBL Sublingual Place 0.4 mg under the tongue as needed.      Marland Kitchen RANITIDINE HCL 300 MG PO CAPS Oral Take 300 mg by mouth daily as needed. For indigestion     . TORSEMIDE 20 MG PO TABS Oral Take 1 tablet (20 mg total) by mouth daily. 30 tablet 11  . TRAZODONE HCL 50 MG PO TABS  1 po qhs 30 tablet 5  BP 134/77  Pulse 62  Temp 98.1 F (36.7 C) (Oral)  Resp 19  SpO2 97%  Physical Exam  Constitutional: She is oriented to person, place, and time. She appears well-developed and well-nourished.  HENT:  Head: Normocephalic and atraumatic.  Mouth/Throat: Oropharynx is clear and moist. No oropharyngeal exudate.  Eyes: Conjunctivae and EOM are normal. Pupils are equal, round, and reactive to light.  Neck: Normal range of motion.  Cardiovascular: Normal rate, regular rhythm and normal heart sounds.   No murmur heard. Pulmonary/Chest: Effort normal and breath sounds normal.  Abdominal: Soft. There is no tenderness. There is no rebound and no guarding.  Musculoskeletal: Normal range of motion. She exhibits no edema and no tenderness.  Neurological: She is alert and oriented to person, place, and time. No cranial nerve deficit.       Cranial nerves III through XII intact, no facial asymmetry, 5 out of 5 strength throughout, no ataxia finger to nose  Skin: Skin is warm.    ED Course    Procedures (including critical care time)   Labs Reviewed  CBC  DIFFERENTIAL  COMPREHENSIVE METABOLIC PANEL  PROTIME-INR  CARDIAC PANEL(CRET KIN+CKTOT+MB+TROPI)  URINALYSIS, ROUTINE W REFLEX MICROSCOPIC   No results found.   No diagnosis found.    MDM  Near syncope with lightheadedness on standing. No chest pain or SOB. Patient complains of intermittent burning in her epigastrium where she has a history of "pancreas stones".   No arrhythmias on EKG.  Check orthostatics.  Labs,  US abdomen to evaluate hx of "pancreas stones" and r/o AAA.  IVF, moved to CDU while awaiting studies. D/w PAC Williams.   Date: 07/01/2011  Rate: 61  Rhythm: normal sinus rhythm  QRS Axis: normal  Intervals: normal  ST/T Wave abnormalities: normal  Conduction Disutrbances:none  Narrative Interpretation: PVCs  Old EKG Reviewed: unchanged          Glynn Octave, MD 07/02/11 [phone]

## 2011-07-01 NOTE — Discharge Instructions (Signed)
Your imaging studies including CT scan, ultrasound, and xray and your lab studies appeared normal today as compared with previous, which is reassuring. Please make sure to drink plenty of fluids over the next several days. Call Dr. Loren Racer office tomorrow to make a follow up appointment for later this week.  ???? ??????????? ????????????, ??????? ???????????? ??????????, ??????????????, ?????????????????? ? ? ???? ???????????? ???????????? ????????? ?????????? ??????? ?? ????????? ? ??????????, ??????? ????????. ??????????, ?????????, ??? ???? ????? ???????? ? ??????? ????????? ?????????? ????. ????????? ? ???? ?????? ?????? ??????????, ????? ??????? ?? ?????????? ?? ???? ??????.

## 2011-07-01 NOTE — ED Notes (Signed)
Pt to XR

## 2011-07-01 NOTE — ED Provider Notes (Signed)
Care assumed of pt in CDU at 3pm from Dr. Manus Gunning. Pt with near syncopal episode today. She was moved to the CDU awaiting an ultrasound of the abdomen to rule out gallstones/AAA, urinalysis, head CT, and orthostatic vitals. These studies/tests all appear negative. Pt has remained stable while in CDU. Feel pt stable for dc home at this time with close follow up from Dr. Posey Rea. Findings explained to pt/family; dc instructions given in [place] and [place].  Grant Fontana, PA-C 07/01/11 1959  Flint Melter, MD 07/02/11 1044

## 2011-07-01 NOTE — ED Notes (Signed)
Pt daughter in room

## 2011-07-08 ENCOUNTER — Encounter: Payer: Self-pay | Admitting: Oncology

## 2011-07-08 ENCOUNTER — Telehealth: Payer: Self-pay | Admitting: Oncology

## 2011-07-08 ENCOUNTER — Ambulatory Visit (HOSPITAL_BASED_OUTPATIENT_CLINIC_OR_DEPARTMENT_OTHER): Payer: Medicare Other | Admitting: Oncology

## 2011-07-08 ENCOUNTER — Other Ambulatory Visit (HOSPITAL_BASED_OUTPATIENT_CLINIC_OR_DEPARTMENT_OTHER): Payer: Medicare Other | Admitting: Lab

## 2011-07-08 VITALS — BP 174/83 | HR 61 | Temp 97.8°F | Ht 64.75 in | Wt 174.0 lb

## 2011-07-08 DIAGNOSIS — Z86718 Personal history of other venous thrombosis and embolism: Secondary | ICD-10-CM

## 2011-07-08 DIAGNOSIS — F29 Unspecified psychosis not due to a substance or known physiological condition: Secondary | ICD-10-CM

## 2011-07-08 DIAGNOSIS — T148XXA Other injury of unspecified body region, initial encounter: Secondary | ICD-10-CM

## 2011-07-08 DIAGNOSIS — D696 Thrombocytopenia, unspecified: Secondary | ICD-10-CM

## 2011-07-08 DIAGNOSIS — I10 Essential (primary) hypertension: Secondary | ICD-10-CM

## 2011-07-08 LAB — CBC WITH DIFFERENTIAL/PLATELET
BASO%: 0.3 % (ref 0.0–2.0)
Basophils Absolute: 0 10*3/uL (ref 0.0–0.1)
EOS%: 0.5 % (ref 0.0–7.0)
Eosinophils Absolute: 0 10*3/uL (ref 0.0–0.5)
HCT: 38.4 % (ref 34.8–46.6)
HGB: 13.4 g/dL (ref 11.6–15.9)
LYMPH%: 18.2 % (ref 14.0–49.7)
MCH: 31.7 pg (ref 25.1–34.0)
MCHC: 35 g/dL (ref 31.5–36.0)
MCV: 90.6 fL (ref 79.5–101.0)
MONO#: 0.6 10*3/uL (ref 0.1–0.9)
MONO%: 6.7 % (ref 0.0–14.0)
NEUT#: 6.2 10*3/uL (ref 1.5–6.5)
NEUT%: 74.3 % (ref 38.4–76.8)
Platelets: 89 10*3/uL — ABNORMAL LOW (ref 145–400)
RBC: 4.24 10*6/uL (ref 3.70–5.45)
RDW: 13 % (ref 11.2–14.5)
WBC: 8.3 10*3/uL (ref 3.9–10.3)
lymph#: 1.5 10*3/uL (ref 0.9–3.3)

## 2011-07-08 LAB — COMPREHENSIVE METABOLIC PANEL
ALT: 21 U/L (ref 0–35)
AST: 21 U/L (ref 0–37)
Albumin: 4.5 g/dL (ref 3.5–5.2)
Alkaline Phosphatase: 63 U/L (ref 39–117)
BUN: 18 mg/dL (ref 6–23)
CO2: 28 mEq/L (ref 19–32)
Calcium: 9.3 mg/dL (ref 8.4–10.5)
Chloride: 106 mEq/L (ref 96–112)
Creatinine, Ser: 0.91 mg/dL (ref 0.50–1.10)
Glucose, Bld: 150 mg/dL — ABNORMAL HIGH (ref 70–99)
Potassium: 4.6 mEq/L (ref 3.5–5.3)
Sodium: 142 mEq/L (ref 135–145)
Total Bilirubin: 0.8 mg/dL (ref 0.3–1.2)
Total Protein: 6.8 g/dL (ref 6.0–8.3)

## 2011-07-08 LAB — LACTATE DEHYDROGENASE: LDH: 162 U/L (ref 94–250)

## 2011-07-08 NOTE — Progress Notes (Signed)
CC:   Alice Quint. Plotnikov, MD   PROBLEM LIST:   1. 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 Apparently  thrombocytopenia was 1st noted around October 2010 when the  platelet count at that time was 128,000. We have elected not to do  a bone marrow at this time.  2. History of CVA 11 years ago.  3. Hypertension for over 30 years.  4. Episodes of confusion and memory loss possibly due to dementia.  5. Syncopal episodes, possibly vasovagal in nature.  6. Laparoscopic cholecystectomy 10/26/2008.   MEDICATIONS: 1. Coreg 25 mg twice daily. 2. Cholecalciferol 2000 mg daily. 3. Cyanocobalamin 1000 mcg sublingually. 4. Aricept 5 mg at bedtime. 5. Prozac 20 mg daily. 6. Neurontin 100 mg as needed for tinnitus. 7. Cozaar 100 mg daily. 8. Nitrostat 0.4 mg sublingually as needed. 9. Demadex 20 mg daily. 10.Desyrel 50 mg at bedtime.  HISTORY:  Alice Wong was seen in followup for her thrombocytopenia. The patient was last seen by [place] on 12/28/2010.  She is accompanied today by her daughter, Alice Wong.  The patient speaks very limited [place].  The patient has had a somewhat variable platelet count.  Her thrombocytopenia is felt to be on an autoimmune basis.  The patient's platelet count has been variable anywhere from 44 up to about 120.  When we saw the patient on 12/28/2010 her platelet count was 47,000.  On 02/28/2011 platelet count was 55,000.  On 05/02/2011 89,000 and today also 89,000.  The patient denies any bleeding or bruising problems.  She apparently went to the emergency room on June 13 and again on June 17 with some headache, lightheadedness, near syncope and elevated blood pressure.  According to her daughter, she is not being terribly compliant with her blood pressure medicines.  PHYSICAL EXAM:  The patient shows little change.  Weight is 174 pounds. Height 5 feet 4-3/4 inches.  Body surface area 1.9 m2.  Blood pressure today 174/83.  The patient apparently did not  take her Coreg today. Other vital signs are normal.  There is no scleral icterus.  Mouth and pharynx are benign.  No peripheral adenopathy palpable.  Heart and lungs are normal.  Breasts were not examined.  Abdomen with the patient sitting is benign with no organomegaly or masses palpable.  Extremities no peripheral edema.  No petechiae or purpura.  Neurologic exam was normal.  LABORATORY DATA:  Today, white count 8.3, ANC 6.2, hemoglobin 13.4, hematocrit 38.4, platelets 89,000.  Chemistries today are pending. Chemistries from 10/23/2010 were entirely normal.  The patient had chemistries on 07/01/2011 notable for a BUN of 26, creatinine 1.11 and an estimated GFR of 48.  Glucose was 201.  IMAGING STUDIES: 1. Digital screening mammogram on 02/20/2011 showed possible mass in     the left breast.  Additional imaging studies were recommended. 2. Digital diagnostic left mammogram and left breast ultrasound     carried out on 03/07/2011 showed no abnormalities in the left     breast to suggest malignancy in the region of the abnormal finding     on the screening exam.  Bilateral screening mammograms were     recommended for 1 year. 3. Chest x-ray, 2 view, from 07/01/2011 showed no acute abnormalities. 4. Complete abdominal ultrasound carried out on 07/01/2011 showed a     spleen measuring 12.2 cm in length with normal echogenicity.  The     patient was noted to have had a previous cholecystectomy with some  residual common bile duct dilatation.  Common bile duct measured 11     mm.  There was some coarse increased echogenicity of the liver     suspicious for fatty infiltration. 5. CT of the head without IV contrast on 07/01/2011 was negative.  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  Today her platelet count is 89,000.  She has not required any treatment.  The patient has not had a bone marrow exam.  We are following her  conservatively.  We will obtain CBCs every 3 months and plan to see the patient again in about 9 months which should be March 2014.  We will check CBC and chemistries at that time.    ______________________________ Samul Dada, M.D. DSM/MEDQ  D:  07/08/2011  T:  07/08/2011  Job:  [id number]

## 2011-07-08 NOTE — Progress Notes (Signed)
This office note has been dictated.  #[id number]

## 2011-07-08 NOTE — Telephone Encounter (Signed)
appts made and printed for pt aom °

## 2011-07-11 ENCOUNTER — Encounter: Payer: Self-pay | Admitting: Internal Medicine

## 2011-07-11 ENCOUNTER — Ambulatory Visit (INDEPENDENT_AMBULATORY_CARE_PROVIDER_SITE_OTHER): Payer: Medicare Other | Admitting: Internal Medicine

## 2011-07-11 VITALS — BP 158/80 | HR 80 | Temp 98.4°F | Resp 16 | Wt 173.0 lb

## 2011-07-11 DIAGNOSIS — F329 Major depressive disorder, single episode, unspecified: Secondary | ICD-10-CM

## 2011-07-11 DIAGNOSIS — F411 Generalized anxiety disorder: Secondary | ICD-10-CM

## 2011-07-11 DIAGNOSIS — I10 Essential (primary) hypertension: Secondary | ICD-10-CM

## 2011-07-11 DIAGNOSIS — R42 Dizziness and giddiness: Secondary | ICD-10-CM

## 2011-07-11 DIAGNOSIS — R413 Other amnesia: Secondary | ICD-10-CM

## 2011-07-11 DIAGNOSIS — Z8679 Personal history of other diseases of the circulatory system: Secondary | ICD-10-CM

## 2011-07-11 MED ORDER — ALPRAZOLAM 0.25 MG PO TABS
0.2500 mg | ORAL_TABLET | Freq: Three times a day (TID) | ORAL | Status: DC | PRN
Start: 2011-07-11 — End: 2012-06-04

## 2011-07-11 MED ORDER — OMEPRAZOLE 20 MG PO CPDR: 20.0000 mg | DELAYED_RELEASE_CAPSULE | Freq: Every day | ORAL | Status: DC

## 2011-07-11 MED ORDER — FLUOXETINE HCL 20 MG PO TABS: 20.0000 mg | ORAL_TABLET | Freq: Every day | ORAL | Status: DC

## 2011-07-11 NOTE — Assessment & Plan Note (Signed)
Chronic w/panic attacks She has some degree of a conversion disorder Continue with current prescription therapy as reflected on the Med list.

## 2011-07-11 NOTE — Assessment & Plan Note (Signed)
Chronic labile Orthostatic lightheadedness - hard to manage - the best we can do... Risks associated with treatment noncompliance were discussed. Compliance was encouraged.

## 2011-07-11 NOTE — Assessment & Plan Note (Signed)
Continue with current prescription therapy as reflected on the Med list.  
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
## 2011-07-11 NOTE — Assessment & Plan Note (Signed)
Gabapentin seems to help Cont Rx

## 2011-07-11 NOTE — Assessment & Plan Note (Addendum)
Continue with current prescription therapy as reflected on the Med list. Chronic with psychosomatic and conversion disorder traits Discussed

## 2011-07-26 ENCOUNTER — Telehealth: Payer: Self-pay | Admitting: Internal Medicine

## 2011-07-26 NOTE — Telephone Encounter (Signed)
Caller: Olga/Child; PCP: Plotnikov, Alex; CB#: [phone];  Call regarding vasovagal attacks; daughter is requesting a letter written by MD stating that pt has this "disease" where she feels like she is going to pass out but all she has to do is lay down; senior center has called 911 x 2 because she feels like this and daughter states that this is not needed; pt has had this all her life; pt is fine now with no sx now; calling requesting a letter to be written by MD so daughter can give to senior center so they are aware of her condition; OFFICE PLEASE REVIEW AND CALL DAUGHTER BACK

## 2011-07-26 NOTE — Telephone Encounter (Signed)
I can write a letter however the Senior center probably has a policy in regard of when to call [phone] Thx

## 2011-07-26 NOTE — Telephone Encounter (Signed)
Letter mailed to pts home address.  

## 2011-08-14 ENCOUNTER — Ambulatory Visit: Payer: Medicare Other | Admitting: Internal Medicine

## 2011-08-23 ENCOUNTER — Encounter: Payer: Self-pay | Admitting: Internal Medicine

## 2011-08-23 ENCOUNTER — Ambulatory Visit (INDEPENDENT_AMBULATORY_CARE_PROVIDER_SITE_OTHER): Payer: Medicare Other | Admitting: Internal Medicine

## 2011-08-23 ENCOUNTER — Other Ambulatory Visit (INDEPENDENT_AMBULATORY_CARE_PROVIDER_SITE_OTHER): Payer: Medicare Other

## 2011-08-23 VITALS — BP 160/92 | HR 72 | Temp 98.2°F | Resp 16 | Wt 178.0 lb

## 2011-08-23 DIAGNOSIS — Z79899 Other long term (current) drug therapy: Secondary | ICD-10-CM

## 2011-08-23 DIAGNOSIS — R55 Syncope and collapse: Secondary | ICD-10-CM

## 2011-08-23 DIAGNOSIS — M545 Low back pain, unspecified: Secondary | ICD-10-CM

## 2011-08-23 DIAGNOSIS — F459 Somatoform disorder, unspecified: Secondary | ICD-10-CM

## 2011-08-23 DIAGNOSIS — I10 Essential (primary) hypertension: Secondary | ICD-10-CM

## 2011-08-23 DIAGNOSIS — F329 Major depressive disorder, single episode, unspecified: Secondary | ICD-10-CM

## 2011-08-23 DIAGNOSIS — F411 Generalized anxiety disorder: Secondary | ICD-10-CM

## 2011-08-23 DIAGNOSIS — R079 Chest pain, unspecified: Secondary | ICD-10-CM

## 2011-08-23 DIAGNOSIS — Z8679 Personal history of other diseases of the circulatory system: Secondary | ICD-10-CM

## 2011-08-23 LAB — LIPID PANEL
Cholesterol: 166 mg/dL (ref 0–200)
HDL: 36.9 mg/dL — ABNORMAL LOW (ref >39.00)
LDL Cholesterol: 106 mg/dL — ABNORMAL HIGH (ref 0–99)
Total CHOL/HDL Ratio: 4
Triglycerides: 115 mg/dL (ref 0.0–149.0)
VLDL: 23 mg/dL (ref 0.0–40.0)

## 2011-08-23 LAB — BASIC METABOLIC PANEL
BUN: 18 mg/dL (ref 6–23)
CO2: 28 mEq/L (ref 19–32)
Calcium: 9.1 mg/dL (ref 8.4–10.5)
Chloride: 103 mEq/L (ref 96–112)
Creatinine, Ser: 0.9 mg/dL (ref 0.4–1.2)
GFR: 68.36 mL/min (ref >60.00)
Glucose, Bld: 139 mg/dL — ABNORMAL HIGH (ref 70–99)
Potassium: 4.4 mEq/L (ref 3.5–5.1)
Sodium: 137 mEq/L (ref 135–145)

## 2011-08-23 LAB — HEPATIC FUNCTION PANEL
ALT: 26 U/L (ref 0–35)
AST: 30 U/L (ref 0–37)
Albumin: 4 g/dL (ref 3.5–5.2)
Alkaline Phosphatase: 64 U/L (ref 39–117)
Bilirubin, Direct: 0.1 mg/dL (ref 0.0–0.3)
Total Bilirubin: 0.9 mg/dL (ref 0.3–1.2)
Total Protein: 6.8 g/dL (ref 6.0–8.3)

## 2011-08-23 LAB — IBC PANEL
Iron: 81 ug/dL (ref 42–145)
Saturation Ratios: 28.9 % (ref 20.0–50.0)
Transferrin: 200.4 mg/dL — ABNORMAL LOW (ref 212.0–360.0)

## 2011-08-23 LAB — CBC WITH DIFFERENTIAL/PLATELET
Basophils Absolute: 0 10*3/uL (ref 0.0–0.1)
Basophils Relative: 0.7 % (ref 0.0–3.0)
Eosinophils Absolute: 0.1 10*3/uL (ref 0.0–0.7)
Eosinophils Relative: 1.1 % (ref 0.0–5.0)
HCT: 38.8 % (ref 36.0–46.0)
Hemoglobin: 13.1 g/dL (ref 12.0–15.0)
Lymphocytes Relative: 26.2 % (ref 12.0–46.0)
Lymphs Abs: 1.6 10*3/uL (ref 0.7–4.0)
MCHC: 33.6 g/dL (ref 30.0–36.0)
MCV: 92.2 fl (ref 78.0–100.0)
Monocytes Absolute: 0.5 10*3/uL (ref 0.1–1.0)
Monocytes Relative: 7.7 % (ref 3.0–12.0)
Neutro Abs: 4 10*3/uL (ref 1.4–7.7)
Neutrophils Relative %: 64.3 % (ref 43.0–77.0)
Platelets: 75 10*3/uL — ABNORMAL LOW (ref 150.0–400.0)
RBC: 4.21 Mil/uL (ref 3.87–5.11)
RDW: 12.3 % (ref 11.5–14.6)
WBC: 6.2 10*3/uL (ref 4.5–10.5)

## 2011-08-23 LAB — CARDIAC PANEL
CK-MB: 2 ng/mL (ref 0.3–4.0)
Relative Index: 1.7 calc (ref 0.0–2.5)
Total CK: 115 U/L (ref 7–177)

## 2011-08-23 LAB — TSH: TSH: 1.32 u[IU]/mL (ref 0.35–5.50)

## 2011-08-23 LAB — VITAMIN B12: Vitamin B-12: 345 pg/mL (ref 211–911)

## 2011-08-23 LAB — H. PYLORI ANTIBODY, IGG: H Pylori IgG: NEGATIVE

## 2011-08-23 MED ORDER — ALPRAZOLAM 0.25 MG PO TABS: 0.2500 mg | ORAL_TABLET | Freq: Two times a day (BID) | ORAL | Status: AC | PRN

## 2011-08-23 MED ORDER — OMEPRAZOLE 40 MG PO CPDR: 40.0000 mg | DELAYED_RELEASE_CAPSULE | Freq: Every day | ORAL | Status: DC

## 2011-08-23 NOTE — Assessment & Plan Note (Signed)
Chronic, severe for years S/p cardiac w/up in [facility] 2010 (?) Will get a stress ECHO Continue with current prescription therapy as reflected on the Med list.

## 2011-08-23 NOTE — Assessment & Plan Note (Signed)
Multiple complaints and symptoms, including near syncope

## 2011-08-23 NOTE — Assessment & Plan Note (Signed)
Continue with current prescription therapy as reflected on the Med list.  
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
## 2011-08-23 NOTE — Assessment & Plan Note (Signed)
Vaso-vagal, recurrent

## 2011-08-23 NOTE — Assessment & Plan Note (Signed)
Cont Fluoxetine 
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
## 2011-09-04 ENCOUNTER — Other Ambulatory Visit (HOSPITAL_COMMUNITY): Payer: Medicare Other

## 2011-10-07 ENCOUNTER — Ambulatory Visit (INDEPENDENT_AMBULATORY_CARE_PROVIDER_SITE_OTHER): Payer: Medicare Other | Admitting: Internal Medicine

## 2011-10-07 ENCOUNTER — Encounter: Payer: Self-pay | Admitting: Gastroenterology

## 2011-10-07 ENCOUNTER — Encounter: Payer: Self-pay | Admitting: Internal Medicine

## 2011-10-07 VITALS — BP 160/100 | HR 80 | Temp 97.5°F | Resp 16 | Wt 170.8 lb

## 2011-10-07 DIAGNOSIS — K219 Gastro-esophageal reflux disease without esophagitis: Secondary | ICD-10-CM

## 2011-10-07 DIAGNOSIS — F459 Somatoform disorder, unspecified: Secondary | ICD-10-CM

## 2011-10-07 DIAGNOSIS — M545 Low back pain, unspecified: Secondary | ICD-10-CM

## 2011-10-07 DIAGNOSIS — F329 Major depressive disorder, single episode, unspecified: Secondary | ICD-10-CM

## 2011-10-07 DIAGNOSIS — I10 Essential (primary) hypertension: Secondary | ICD-10-CM

## 2011-10-07 DIAGNOSIS — G4700 Insomnia, unspecified: Secondary | ICD-10-CM

## 2011-10-07 DIAGNOSIS — R55 Syncope and collapse: Secondary | ICD-10-CM

## 2011-10-07 DIAGNOSIS — R079 Chest pain, unspecified: Secondary | ICD-10-CM

## 2011-10-07 NOTE — Assessment & Plan Note (Signed)
Restart meds.

## 2011-10-07 NOTE — Assessment & Plan Note (Signed)
GI consult Dr Arlyce Dice

## 2011-10-07 NOTE — Assessment & Plan Note (Signed)
Last episode 2 mo ago

## 2011-10-07 NOTE — Assessment & Plan Note (Signed)
Continue with current prescription therapy as reflected on the Med list.  
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
## 2011-10-07 NOTE — Assessment & Plan Note (Signed)
GI consult - a lot of upper GI complaints
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
## 2011-10-07 NOTE — Assessment & Plan Note (Signed)
Chronic sx's Continue with current prescription therapy as reflected on the Med list.  

## 2011-10-08 ENCOUNTER — Other Ambulatory Visit (HOSPITAL_BASED_OUTPATIENT_CLINIC_OR_DEPARTMENT_OTHER): Payer: Medicare Other | Admitting: Lab

## 2011-10-08 DIAGNOSIS — D696 Thrombocytopenia, unspecified: Secondary | ICD-10-CM

## 2011-10-08 DIAGNOSIS — T148XXA Other injury of unspecified body region, initial encounter: Secondary | ICD-10-CM

## 2011-10-08 LAB — CBC WITH DIFFERENTIAL/PLATELET
BASO%: 0.6 % (ref 0.0–2.0)
Basophils Absolute: 0 10*3/uL (ref 0.0–0.1)
EOS%: 1.1 % (ref 0.0–7.0)
Eosinophils Absolute: 0.1 10*3/uL (ref 0.0–0.5)
HCT: 38.8 % (ref 34.8–46.6)
HGB: 13.7 g/dL (ref 11.6–15.9)
LYMPH%: 24.2 % (ref 14.0–49.7)
MCH: 31.2 pg (ref 25.1–34.0)
MCHC: 35.3 g/dL (ref 31.5–36.0)
MCV: 88.4 fL (ref 79.5–101.0)
MONO#: 0.6 10*3/uL (ref 0.1–0.9)
MONO%: 8.3 % (ref 0.0–14.0)
NEUT#: 4.6 10*3/uL (ref 1.5–6.5)
NEUT%: 65.8 % (ref 38.4–76.8)
Platelets: 69 10*3/uL — ABNORMAL LOW (ref 145–400)
RBC: 4.39 10*6/uL (ref 3.70–5.45)
RDW: 12.4 % (ref 11.2–14.5)
WBC: 7 10*3/uL (ref 3.9–10.3)
lymph#: 1.7 10*3/uL (ref 0.9–3.3)

## 2011-10-09 ENCOUNTER — Other Ambulatory Visit (HOSPITAL_COMMUNITY): Payer: Medicare Other

## 2011-10-17 ENCOUNTER — Telehealth: Payer: Self-pay | Admitting: Internal Medicine

## 2011-10-17 NOTE — Telephone Encounter (Signed)
Caller: Olga/Child; Patient Name: Alice Wong; PCP: Plotnikov, Alex (Adults only); Best Callback Phone Number: [phone] Daughter calling regarding redness and inflammation to 2nd toe on right foot, onset 10/16/11, history of gout and wants an appt or medicine called in. Has not taken any pain medication yet.History of gout but has not been evaluated for in over 3 years per daughter. Emergent signs and symptoms ruled out as per Foot Non-Injury protocol except for see in 24 hours due to sensations of shooting or burning discomfort with movement and stops with rest and not previously evaluated. Please call daughter if able to work pt in today on 10/17/11, scheduled with Dr. Posey Rea 10/18/11 at 4:15 pm. Advised Motrin as directed for pain.

## 2011-10-18 ENCOUNTER — Ambulatory Visit (INDEPENDENT_AMBULATORY_CARE_PROVIDER_SITE_OTHER): Payer: Medicare Other | Admitting: Internal Medicine

## 2011-10-18 VITALS — BP 174/98 | HR 80 | Temp 98.1°F | Resp 16 | Wt 173.0 lb

## 2011-10-18 DIAGNOSIS — M109 Gout, unspecified: Secondary | ICD-10-CM

## 2011-10-18 DIAGNOSIS — M545 Low back pain, unspecified: Secondary | ICD-10-CM

## 2011-10-18 DIAGNOSIS — M79609 Pain in unspecified limb: Secondary | ICD-10-CM

## 2011-10-18 DIAGNOSIS — I10 Essential (primary) hypertension: Secondary | ICD-10-CM

## 2011-10-18 MED ORDER — METHYLPREDNISOLONE ACETATE 80 MG/ML IJ SUSP
120.0000 mg | Freq: Once | INTRAMUSCULAR | Status: AC
  Administered 2011-10-18: 120 mg via INTRAMUSCULAR

## 2011-10-18 MED ORDER — TRAMADOL HCL 50 MG PO TABS: 50.0000 mg | ORAL_TABLET | Freq: Two times a day (BID) | ORAL | Status: DC | PRN

## 2011-10-18 MED ORDER — INDOMETHACIN 50 MG PO CAPS: 50.0000 mg | ORAL_CAPSULE | Freq: Three times a day (TID) | ORAL | Status: DC | PRN

## 2011-10-18 NOTE — Assessment & Plan Note (Signed)
10/13 R 1st MTP  Depomedrol IM Indocin Tramadol prn ACE Surg shoe

## 2011-10-18 NOTE — Assessment & Plan Note (Signed)
10/13 R 1st MTP - gout

## 2011-10-20 ENCOUNTER — Encounter: Payer: Self-pay | Admitting: Internal Medicine
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
## 2011-10-20 NOTE — Assessment & Plan Note (Signed)
Continue with current prescription therapy as reflected on the Med list.  

## 2011-11-12 ENCOUNTER — Encounter: Payer: Self-pay | Admitting: Gastroenterology

## 2011-11-12 ENCOUNTER — Ambulatory Visit (INDEPENDENT_AMBULATORY_CARE_PROVIDER_SITE_OTHER): Payer: Medicare Other | Admitting: Gastroenterology

## 2011-11-12 VITALS — BP 134/76 | HR 64 | Ht 64.75 in | Wt 174.0 lb

## 2011-11-12 DIAGNOSIS — F411 Generalized anxiety disorder: Secondary | ICD-10-CM

## 2011-11-12 DIAGNOSIS — R079 Chest pain, unspecified: Secondary | ICD-10-CM

## 2011-11-12 NOTE — Assessment & Plan Note (Addendum)
Chronic, burning chest discomfort could be do to esophageal reflux. Symptoms continue despite PPI therapy.  Recommendations #1 upper endoscopy #2 to consider switching to dexilant, pending #1

## 2011-11-12 NOTE — Patient Instructions (Addendum)
Upper GI Endoscopy Upper GI endoscopy means using a flexible scope to look at the esophagus, stomach, and upper small bowel. This is done to make a diagnosis in people with heartburn, abdominal pain, or abnormal bleeding. Sometimes an endoscope is needed to remove foreign bodies or food that become stuck in the esophagus; it can also be used to take biopsy samples. For the best results, do not eat or drink for 8 hours before having your upper endoscopy.  To perform the endoscopy, you will probably be sedated and your throat will be numbed with a special spray. The endoscope is then slowly passed down your throat (this will not interfere with your breathing). An endoscopy exam takes 15 to 30 minutes to complete and there is no real pain. Patients rarely remember much about the procedure. The results of the test may take several days if a biopsy or other test is taken.  You may have a sore throat after an endoscopy exam. Serious complications are very rare. Stick to liquids and soft foods until your pain is better. Do not drive a car or operate any dangerous equipment for at least 24 hours after being sedated. SEEK IMMEDIATE MEDICAL CARE IF:   You have severe throat pain.   You have shortness of breath.   You have bleeding problems.   You have a fever.   You have difficulty recovering from your sedation.  Document Released: 02/08/2004 Document Revised: 12/20/2010 Document Reviewed: 01/03/2008 [employer] Patient Information 2012 [employer]. 

## 2011-11-12 NOTE — Progress Notes (Signed)
History of Present Illness: 77 year old white female referred at the request of Dr. Posey Rea referred for evaluation of chest pain.  History was provided through a Nurse, learning disability. For several years she has been complaining of burning chest discomfort. She has undergone cardiac workup in the past that has been negative. She takes omeprazole. The patient is a poor historian. She claims that she has burning pain all the time. It is not related to eating. It may occur at anytime during the day or night. She denies dysphagia. Over the past month she's been taking Indocin for gout.   Her symptoms long preceded the use of NSAIDs.    Past Medical History  Diagnosis Date  . Anxiety   . Depression   . GERD (gastroesophageal reflux disease)   . Hyperlipidemia   . Hypertension   . Osteoporosis   . CVA (cerebral vascular accident)   . Thyroid cyst   . Cholelithiasis   . MR (mitral regurgitation)   . Memory loss   . Gout 2009  . Vitamin D deficiency   . H. pylori infection 2011  . Vaso-vagal reaction   . Thrombocytopenia   . Diverticulosis    Past Surgical History  Procedure Date  . Cholecystectomy 2010  . Lasik    family history includes Hypertension in her other; Stomach cancer in her brother; and Ulcers in her son.  There is no history of Colon cancer. Current Outpatient Prescriptions  Medication Sig Dispense Refill  . carvedilol (COREG) 25 MG tablet Take 1 tablet (25 mg total) by mouth 2 (two) times daily. For blood pressure  60 tablet  11  . Cholecalciferol 1000 UNITS tablet Take 2,000 Units by mouth daily.        . Cyanocobalamin (VITAMIN B-12) 1000 MCG SUBL Place 1 tablet under the tongue daily.        Marland Kitchen donepezil (ARICEPT) 5 MG tablet Take 1 tablet (5 mg total) by mouth at bedtime.  30 tablet  11  . FLUoxetine (PROZAC) 20 MG tablet Take 1 tablet (20 mg total) by mouth daily.  30 tablet  11  . gabapentin (NEURONTIN) 100 MG capsule Use bid for ringing in the ears  60 capsule  5  .  indomethacin (INDOCIN) 50 MG capsule Take 1 capsule (50 mg total) by mouth 3 (three) times daily as needed (for gout).  60 capsule  1  . losartan (COZAAR) 100 MG tablet Take 1 tablet (100 mg total) by mouth daily.  30 tablet  11  . nitroGLYCERIN (NITROSTAT) 0.4 MG SL tablet Place 0.4 mg under the tongue as needed.        Marland Kitchen omeprazole (PRILOSEC) 40 MG capsule Take 1 capsule (40 mg total) by mouth daily.  90 capsule  3  . torsemide (DEMADEX) 20 MG tablet Take 1 tablet (20 mg total) by mouth daily.  30 tablet  11  . traMADol (ULTRAM) 50 MG tablet Take 1-2 tablets (50-100 mg total) by mouth 2 (two) times daily as needed for pain.  60 tablet  1  . traZODone (DESYREL) 50 MG tablet 1 po qhs       Allergies as of 11/12/2011 - Review Complete 11/12/2011  Allergen Reaction Noted  . Morphine sulfate  09/13/2008    reports that she has never smoked. She has never used smokeless tobacco. She reports that she does not drink alcohol or use illicit drugs.     Review of Systems: Pertinent positive and negative review of systems were noted in the  above HPI section. All other review of systems were otherwise negative.  Vital signs were reviewed in today's medical record Physical Exam: General: Well developed , well nourished, no acute distress Head: Normocephalic and atraumatic Eyes:  sclerae anicteric, EOMI Ears: Normal auditory acuity Mouth: No deformity or lesions Neck: Supple, no masses or thyromegaly Lungs: Clear throughout to auscultation Heart: Regular rate and rhythm; no murmurs, rubs or bruits Abdomen: Soft,and non distended. No masses, hepatosplenomegaly or hernias noted. Normal Bowel sounds. There is minimal tenderness to palpation in the subxiphoid area Rectal:deferred Musculoskeletal: Symmetrical with no gross deformities  Skin: No lesions on visible extremities Pulses:  Normal pulses noted Extremities: No clubbing, cyanosis, edema or deformities noted Neurological: Alert oriented x 4,  grossly nonfocal Cervical Nodes:  No significant cervical adenopathy Inguinal Nodes: No significant inguinal adenopathy Psychological:  Alert and cooperative. Normal mood and affect

## 2011-11-12 NOTE — Assessment & Plan Note (Signed)
Anxiety may be contributing to her complaints of chest pain

## 2011-11-14 ENCOUNTER — Encounter: Payer: Self-pay | Admitting: Gastroenterology

## 2011-11-14 ENCOUNTER — Ambulatory Visit (AMBULATORY_SURGERY_CENTER): Payer: Medicare Other | Admitting: Gastroenterology

## 2011-11-14 VITALS — BP 172/95 | HR 54 | Temp 97.4°F | Resp 15 | Ht 64.0 in | Wt 174.0 lb

## 2011-11-14 DIAGNOSIS — K2970 Gastritis, unspecified, without bleeding: Secondary | ICD-10-CM

## 2011-11-14 DIAGNOSIS — K2990 Gastroduodenitis, unspecified, without bleeding: Secondary | ICD-10-CM

## 2011-11-14 DIAGNOSIS — R079 Chest pain, unspecified: Secondary | ICD-10-CM

## 2011-11-14 MED ORDER — DEXLANSOPRAZOLE 60 MG PO CPDR: DELAYED_RELEASE_CAPSULE | ORAL | Status: DC

## 2011-11-14 MED ORDER — SODIUM CHLORIDE 0.9 % IV SOLN: 500.0000 mL | INTRAVENOUS | Status: DC

## 2011-11-14 NOTE — Progress Notes (Signed)
Patient did not experience any of the following events: a burn prior to discharge; a fall within the facility; wrong site/side/patient/procedure/implant event; or a hospital transfer or hospital admission upon discharge from the facility. (G8907) Patient did not have preoperative order for IV antibiotic SSI prophylaxis. (G8918)  

## 2011-11-14 NOTE — Op Note (Signed)
[facility] [address] [place], [address]   ENDOSCOPY PROCEDURE REPORT  PATIENT: Alice, Wong  MR#: [id number] BIRTHDATE: January 07, 1935 , 77  yrs. old GENDER: Female ENDOSCOPIST: Louis Meckel, MD REFERRED BY:  Janeal Holmes, M.D. PROCEDURE DATE:  11/14/2011 PROCEDURE:  EGD w/ biopsy ASA CLASS:     Class II INDICATIONS:  chest pain. MEDICATIONS: MAC sedation, administered by CRNA, propofol (Diprivan) 100mg  IV, and Simethicone 0.6cc PO TOPICAL ANESTHETIC:  DESCRIPTION OF PROCEDURE: After the risks benefits and alternatives of the procedure were thoroughly explained, informed consent was obtained.  The LB GIF-H180 [id number] endoscope was introduced through the mouth and advanced to the third portion of the duodenum. Without limitations.  The instrument was slowly withdrawn as the mucosa was fully examined.    There was diffuse , mild erythema throughout the stomach.  biopsies were taken.  There was a significant amount of retained food.   The remainder of the upper endoscopy exam was otherwise normal. Retroflexed views revealed no abnormalities.     The scope was then withdrawn from the patient and the procedure completed.  COMPLICATIONS: There were no complications. ENDOSCOPIC IMPRESSION: 1.   nonerosive gastritis 2.   retained gastric contents  RECOMMENDATIONS: 1.   trial of dexilant 60 mg twice a day 2.   gastric emptying scan REPEAT EXAM:  eSigned:  Louis Meckel, MD 11/14/2011 9:26 AM   CC:

## 2011-11-14 NOTE — Patient Instructions (Addendum)
Discharge instructions given with verbal understanding. Handout on gastritis given. Office will arrange a emptying scan and a return office visit. Resume previous medications. Samples given. Stop omeprazole.  YOU HAD AN ENDOSCOPIC PROCEDURE TODAY AT THE [facility]: Refer to the procedure report that was given to you for any specific questions about what was found during the examination.  If the procedure report does not answer your questions, please call your gastroenterologist to clarify.  If you requested that your care partner not be given the details of your procedure findings, then the procedure report has been included in a sealed envelope for you to review at your convenience later.  YOU SHOULD EXPECT: Some feelings of bloating in the abdomen. Passage of more gas than usual.  Walking can help get rid of the air that was put into your GI tract during the procedure and reduce the bloating. If you had a lower endoscopy (such as a colonoscopy or flexible sigmoidoscopy) you may notice spotting of blood in your stool or on the toilet paper. If you underwent a bowel prep for your procedure, then you may not have a normal bowel movement for a few days.  DIET: Your first meal following the procedure should be a light meal and then it is ok to progress to your normal diet.  A half-sandwich or bowl of soup is an example of a good first meal.  Heavy or fried foods are harder to digest and may make you feel nauseous or bloated.  Likewise meals heavy in dairy and vegetables can cause extra gas to form and this can also increase the bloating.  Drink plenty of fluids but you should avoid alcoholic beverages for 24 hours.  ACTIVITY: Your care partner should take you home directly after the procedure.  You should plan to take it easy, moving slowly for the rest of the day.  You can resume normal activity the day after the procedure however you should NOT DRIVE or use heavy machinery for 24 hours  (because of the sedation medicines used during the test).    SYMPTOMS TO REPORT IMMEDIATELY: A gastroenterologist can be reached at any hour.  During normal business hours, 8:30 AM to 5:00 PM Monday through Friday, call [phone].  After hours and on weekends, please call the GI answering service at [phone] who will take a message and have the physician on call contact you.   Following upper endoscopy (EGD)  Vomiting of blood or coffee ground material  New chest pain or pain under the shoulder blades  Painful or persistently difficult swallowing  New shortness of breath  Fever of 100F or higher  Black, tarry-looking stools  FOLLOW UP: If any biopsies were taken you will be contacted by phone or by letter within the next 1-3 weeks.  Call your gastroenterologist if you have not heard about the biopsies in 3 weeks.  Our staff will call the home number listed on your records the next business day following your procedure to check on you and address any questions or concerns that you may have at that time regarding the information given to you following your procedure. This is a courtesy call and so if there is no answer at the home number and we have not heard from you through the emergency physician on call, we will assume that you have returned to your regular daily activities without incident.  SIGNATURES/CONFIDENTIALITY: You and/or your care partner have signed paperwork which will be entered into your  electronic medical record.  These signatures attest to the fact that that the information above on your After Visit Summary has been reviewed and is understood.  Full responsibility of the confidentiality of this discharge information lies with you and/or your care-partner.

## 2011-11-15 ENCOUNTER — Telehealth: Payer: Self-pay

## 2011-11-15 ENCOUNTER — Other Ambulatory Visit: Payer: Self-pay | Admitting: Gastroenterology

## 2011-11-15 DIAGNOSIS — R109 Unspecified abdominal pain: Secondary | ICD-10-CM

## 2011-11-15 NOTE — Telephone Encounter (Signed)
Pt scheduled for GES at [facility] 12/02/11 arrival time 7:45am for an 8am appt. Pt to be NPO after midnight and hold her Dexilant for 24 hours prior to test. Pt aware of appt date and time.

## 2011-11-15 NOTE — Telephone Encounter (Signed)
  Follow up Call-  Call back number 11/14/2011  Post procedure Call Back phone  # [phone]  Permission to leave phone message Yes     Patient questions:  Do you have a fever, pain , or abdominal swelling? no Pain Score  0 *  Have you tolerated food without any problems? yes  Have you been able to return to your normal activities? yes  Do you have any questions about your discharge instructions: Diet   no Medications  no Follow up visit  no  Do you have questions or concerns about your Care? no  Actions: * If pain score is 4 or above: No action needed, pain <4.

## 2011-11-21 ENCOUNTER — Encounter: Payer: Self-pay | Admitting: Gastroenterology

## 2011-11-22 ENCOUNTER — Telehealth: Payer: Self-pay | Admitting: *Deleted

## 2011-11-22 DIAGNOSIS — A048 Other specified bacterial intestinal infections: Secondary | ICD-10-CM

## 2011-11-22 MED ORDER — AMOXICILL-CLARITHRO-LANSOPRAZ PO MISC: Freq: Two times a day (BID) | ORAL | Status: DC

## 2011-11-22 NOTE — Telephone Encounter (Signed)
Spoke with pt's daughter; results explained and also told that rx for Prev pack was sent to pharmacy.  Understanding voiced

## 2011-11-22 NOTE — Telephone Encounter (Signed)
Pt is to be on Prev Pack per Dr. Arlyce Dice.  LMOM for pt to call office back.  I will send the Prev Pack to her pharmacy but told her to call back so that we can discuss the results

## 2011-12-02 ENCOUNTER — Encounter (HOSPITAL_COMMUNITY)
Admission: RE | Admit: 2011-12-02 | Discharge: 2011-12-02 | Disposition: A | Discharge status: Home / Self Care | Payer: Medicare Other | Source: Ambulatory Visit | Attending: Gastroenterology | Admitting: Gastroenterology

## 2011-12-02 DIAGNOSIS — Z9089 Acquired absence of other organs: Secondary | ICD-10-CM | POA: Insufficient documentation

## 2011-12-02 DIAGNOSIS — R1310 Dysphagia, unspecified: Secondary | ICD-10-CM | POA: Insufficient documentation

## 2011-12-02 DIAGNOSIS — R109 Unspecified abdominal pain: Secondary | ICD-10-CM | POA: Insufficient documentation

## 2011-12-02 MED ORDER — TECHNETIUM TC 99M SULFUR COLLOID
2.0000 | Freq: Once | INTRAVENOUS | Status: AC | PRN
  Administered 2011-12-02: 2 via INTRAVENOUS

## 2011-12-02 NOTE — Progress Notes (Signed)
Quick Note:  Please inform the patient that GES was normal and to continue current plan of action ______ 

## 2011-12-03 ENCOUNTER — Telehealth: Payer: Self-pay | Admitting: Gastroenterology

## 2011-12-03 ENCOUNTER — Telehealth: Payer: Self-pay | Admitting: *Deleted

## 2011-12-03 DIAGNOSIS — A048 Other specified bacterial intestinal infections: Secondary | ICD-10-CM

## 2011-12-03 MED ORDER — BIS SUBCIT-METRONID-TETRACYC 140-125-125 MG PO CAPS: ORAL_CAPSULE | ORAL | Status: DC

## 2011-12-03 NOTE — Telephone Encounter (Signed)
Pt's daughter states prev pack was too expensive.  Spoke with Dr. Arlyce Dice and ok given to prescribe Pylera as directed for two weeks Spoke with daughter and understanding voiced

## 2011-12-03 NOTE — Telephone Encounter (Signed)
Alice Wong, You was working with this patient. I think she wants to speak with you and not me. Seems like you sent her in Prevpac. And maybe its not covered. Not Sure.

## 2011-12-05 ENCOUNTER — Telehealth: Payer: Self-pay | Admitting: *Deleted

## 2011-12-05 NOTE — Telephone Encounter (Signed)
PT TO PICK UP  MEDS/SAMPLES TOMORROW

## 2011-12-05 NOTE — Telephone Encounter (Signed)
WE HAVE SAMPLES OF OMECLAMOX-PAK FOR PT. INSURANCE WOULD NOT PAY FOR PYLERA OR PREVPAC

## 2011-12-20 ENCOUNTER — Encounter: Payer: Self-pay | Admitting: Internal Medicine

## 2011-12-20 ENCOUNTER — Ambulatory Visit (INDEPENDENT_AMBULATORY_CARE_PROVIDER_SITE_OTHER): Payer: Medicare Other | Admitting: Internal Medicine

## 2011-12-20 ENCOUNTER — Ambulatory Visit (INDEPENDENT_AMBULATORY_CARE_PROVIDER_SITE_OTHER)
Admission: RE | Admit: 2011-12-20 | Discharge: 2011-12-20 | Disposition: A | Discharge status: Home / Self Care | Payer: Medicare Other | Source: Ambulatory Visit | Attending: Internal Medicine | Admitting: Internal Medicine

## 2011-12-20 VITALS — BP 200/100 | HR 80 | Temp 97.7°F | Resp 16 | Wt 171.0 lb

## 2011-12-20 DIAGNOSIS — M79644 Pain in right finger(s): Secondary | ICD-10-CM

## 2011-12-20 DIAGNOSIS — I10 Essential (primary) hypertension: Secondary | ICD-10-CM

## 2011-12-20 DIAGNOSIS — M79609 Pain in unspecified limb: Secondary | ICD-10-CM

## 2011-12-20 DIAGNOSIS — K219 Gastro-esophageal reflux disease without esophagitis: Secondary | ICD-10-CM

## 2011-12-20 MED ORDER — AMLODIPINE BESYLATE 5 MG PO TABS: 5.0000 mg | ORAL_TABLET | Freq: Every day | ORAL | Status: DC
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
## 2011-12-20 NOTE — Assessment & Plan Note (Signed)
On H pilori Rx now

## 2011-12-20 NOTE — Assessment & Plan Note (Signed)
ACE X ray Celebrex + Flector

## 2011-12-20 NOTE — Patient Instructions (Signed)
Celebrex 1 a day Flector - change twice a day

## 2011-12-20 NOTE — Assessment & Plan Note (Signed)
Add Norvasc

## 2011-12-25 ENCOUNTER — Encounter: Payer: Self-pay | Admitting: Internal Medicine

## 2011-12-26 ENCOUNTER — Telehealth: Payer: Self-pay | Admitting: *Deleted

## 2011-12-26 NOTE — Telephone Encounter (Signed)
Pt never picked up meds for h pylori  Will pick up tomorrow

## 2012-01-07 ENCOUNTER — Ambulatory Visit (INDEPENDENT_AMBULATORY_CARE_PROVIDER_SITE_OTHER): Payer: Medicare Other | Admitting: Internal Medicine

## 2012-01-07 ENCOUNTER — Other Ambulatory Visit (HOSPITAL_BASED_OUTPATIENT_CLINIC_OR_DEPARTMENT_OTHER): Payer: Medicare Other | Admitting: Lab

## 2012-01-07 ENCOUNTER — Encounter: Payer: Self-pay | Admitting: Internal Medicine

## 2012-01-07 VITALS — BP 142/90 | HR 72 | Temp 97.6°F | Resp 16 | Wt 172.0 lb

## 2012-01-07 DIAGNOSIS — I10 Essential (primary) hypertension: Secondary | ICD-10-CM

## 2012-01-07 DIAGNOSIS — M79644 Pain in right finger(s): Secondary | ICD-10-CM

## 2012-01-07 DIAGNOSIS — M545 Low back pain, unspecified: Secondary | ICD-10-CM

## 2012-01-07 DIAGNOSIS — R413 Other amnesia: Secondary | ICD-10-CM

## 2012-01-07 DIAGNOSIS — T148XXA Other injury of unspecified body region, initial encounter: Secondary | ICD-10-CM

## 2012-01-07 DIAGNOSIS — E785 Hyperlipidemia, unspecified: Secondary | ICD-10-CM

## 2012-01-07 DIAGNOSIS — M79609 Pain in unspecified limb: Secondary | ICD-10-CM

## 2012-01-07 DIAGNOSIS — D696 Thrombocytopenia, unspecified: Secondary | ICD-10-CM

## 2012-01-07 DIAGNOSIS — F411 Generalized anxiety disorder: Secondary | ICD-10-CM

## 2012-01-07 LAB — CBC WITH DIFFERENTIAL/PLATELET
BASO%: 0.6 % (ref 0.0–2.0)
Basophils Absolute: 0 10*3/uL (ref 0.0–0.1)
EOS%: 0.8 % (ref 0.0–7.0)
Eosinophils Absolute: 0.1 10*3/uL (ref 0.0–0.5)
HCT: 37.1 % (ref 34.8–46.6)
HGB: 13.3 g/dL (ref 11.6–15.9)
LYMPH%: 26.6 % (ref 14.0–49.7)
MCH: 32.6 pg (ref 25.1–34.0)
MCHC: 35.8 g/dL (ref 31.5–36.0)
MCV: 91.2 fL (ref 79.5–101.0)
MONO#: 0.4 10*3/uL (ref 0.1–0.9)
MONO%: 5.4 % (ref 0.0–14.0)
NEUT#: 4.4 10*3/uL (ref 1.5–6.5)
NEUT%: 66.6 % (ref 38.4–76.8)
Platelets: 164 10*3/uL (ref 145–400)
RBC: 4.06 10*6/uL (ref 3.70–5.45)
RDW: 13.4 % (ref 11.2–14.5)
WBC: 6.6 10*3/uL (ref 3.9–10.3)
lymph#: 1.7 10*3/uL (ref 0.9–3.3)

## 2012-01-07 NOTE — Assessment & Plan Note (Signed)
Continue with current prescription therapy as reflected on the Med list.  

## 2012-01-07 NOTE — Assessment & Plan Note (Signed)
Doing well 

## 2012-01-07 NOTE — Assessment & Plan Note (Signed)
Better  ACE
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
## 2012-01-07 NOTE — Assessment & Plan Note (Signed)
  On diet  

## 2012-01-10 ENCOUNTER — Other Ambulatory Visit: Payer: Self-pay | Admitting: Internal Medicine

## 2012-02-15 ENCOUNTER — Other Ambulatory Visit: Payer: Self-pay | Admitting: Internal Medicine

## 2012-03-18 ENCOUNTER — Encounter (HOSPITAL_COMMUNITY): Payer: Self-pay | Admitting: Emergency Medicine

## 2012-03-18 ENCOUNTER — Emergency Department (HOSPITAL_COMMUNITY)
Admission: EM | Admit: 2012-03-18 | Discharge: 2012-03-18 | Disposition: A | Discharge status: Home / Self Care | Payer: Medicare Other | Attending: Emergency Medicine | Admitting: Emergency Medicine

## 2012-03-18 DIAGNOSIS — Z8739 Personal history of other diseases of the musculoskeletal system and connective tissue: Secondary | ICD-10-CM | POA: Insufficient documentation

## 2012-03-18 DIAGNOSIS — R55 Syncope and collapse: Secondary | ICD-10-CM

## 2012-03-18 DIAGNOSIS — I10 Essential (primary) hypertension: Secondary | ICD-10-CM | POA: Insufficient documentation

## 2012-03-18 DIAGNOSIS — F329 Major depressive disorder, single episode, unspecified: Secondary | ICD-10-CM | POA: Insufficient documentation

## 2012-03-18 DIAGNOSIS — Z8673 Personal history of transient ischemic attack (TIA), and cerebral infarction without residual deficits: Secondary | ICD-10-CM | POA: Insufficient documentation

## 2012-03-18 DIAGNOSIS — Z8679 Personal history of other diseases of the circulatory system: Secondary | ICD-10-CM | POA: Insufficient documentation

## 2012-03-18 DIAGNOSIS — Z8619 Personal history of other infectious and parasitic diseases: Secondary | ICD-10-CM | POA: Insufficient documentation

## 2012-03-18 DIAGNOSIS — F411 Generalized anxiety disorder: Secondary | ICD-10-CM | POA: Insufficient documentation

## 2012-03-18 DIAGNOSIS — E785 Hyperlipidemia, unspecified: Secondary | ICD-10-CM | POA: Insufficient documentation

## 2012-03-18 DIAGNOSIS — K219 Gastro-esophageal reflux disease without esophagitis: Secondary | ICD-10-CM | POA: Insufficient documentation

## 2012-03-18 DIAGNOSIS — Z862 Personal history of diseases of the blood and blood-forming organs and certain disorders involving the immune mechanism: Secondary | ICD-10-CM | POA: Insufficient documentation

## 2012-03-18 DIAGNOSIS — Z79899 Other long term (current) drug therapy: Secondary | ICD-10-CM | POA: Insufficient documentation

## 2012-03-18 DIAGNOSIS — D696 Thrombocytopenia, unspecified: Secondary | ICD-10-CM | POA: Insufficient documentation

## 2012-03-18 DIAGNOSIS — Z8639 Personal history of other endocrine, nutritional and metabolic disease: Secondary | ICD-10-CM | POA: Insufficient documentation

## 2012-03-18 DIAGNOSIS — Z8719 Personal history of other diseases of the digestive system: Secondary | ICD-10-CM | POA: Insufficient documentation

## 2012-03-18 DIAGNOSIS — F3289 Other specified depressive episodes: Secondary | ICD-10-CM | POA: Insufficient documentation

## 2012-03-18 NOTE — ED Notes (Signed)
Onset today in classroom developed general weakness assist to ground near syncope and incontinent of bowel diarrhea.  EMS was called reports patient feeling better currently. CBG 182. EKG NSR.

## 2012-03-18 NOTE — Progress Notes (Signed)
HOME HEALTH AGENCIES SERVING [place]   Agencies that are Medicare-Certified and are affiliated with [facility]  Telephone Number Address  [facility]   The [facility] has ownership interest in this company; however, you are under no obligation to use this agency. [phone] or  [phone] [address] [place], [place] [address]   Agencies that are Medicare-Certified and are not affiliated with [facility] Telephone Number Address  [facility] [phone] Fax [phone] [address] [place], [place]  [address]  [facility] [phone] or [phone] Fax [phone] [address] [place], [place] [address]  [facility] [phone] Fax [phone] [address] [place], [place] [address]  [facility] [phone] Fax [phone] [address] [place], [place]  [address]  [facility] [phone] Fax [phone] [address] [place], [place] [address]  [facility] [phone] [address] [place], [place] [address]  [facility] [phone]  [address] [place], [place] [address]  [facility] [phone] or [phone] Fax [phone] [address] [place], [place]  [address]  [facility] [phone] Fax [phone] [address] [place], [place]  [address]  [facility]  [phone] Fax [phone] [address] [place], [place] [address]               Agencies that are not Medicare-Certified and are not affiliated with [facility] Agency Telephone Number Address  [facility] [place] [phone] or [phone] Fax [phone] [address] [place]  [address]  [facility] [phone] Fax [phone] [address] [place], [place]  [address]  [facility]  [phone] Fax [phone] [address] [place], [place] [address]  [facility] [phone] Fax [phone] [address] [place], [place] [address]  [facility] [phone] or [phone] Fax [phone] [address] [place], [place]  [address]  [facility] [phone] or [phone] Fax [phone] [address], [address] [place], [place]  [address]  [facility] [phone] Fax [phone] [address] [place], [place]  [address]  [facility] [phone] [address] [place], [place]  [address]  [facility] [phone] Fax [phone] [address] [place], [place]  [address]  [facility] [phone] Fax [phone] [address] [place], [place]  [address]  [facility] [phone] Fax [phone] [address] [place], [place] [address]  [facility] [phone] Fax [phone] [address] [place], [place]  [address]    In to speak to patient regarding home health agency choices. Patient does not speak [place]. Daughter acted as  translator. [facility]. Appropriate referral will be made.
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
## 2012-03-18 NOTE — ED Notes (Signed)
Pt was dizzy while walking pt walked to the nurses desk and back to her room pt is now back in bed VS are BP 146/67, Respirations 16, O2 97%, Pulse 68

## 2012-03-18 NOTE — ED Notes (Signed)
Used language line to discuss discharge. Patient verbalized understanding however stated feels overwhelmed lives with her husband who is 82 and needs assistance maintaining their house and wants to go back to [place] for treatment but cannot.  Case Manger notified and daughter at bedside will speak with patient and family.

## 2012-03-18 NOTE — ED Notes (Signed)
language line used to communicate with patient.

## 2012-03-19 ENCOUNTER — Ambulatory Visit (INDEPENDENT_AMBULATORY_CARE_PROVIDER_SITE_OTHER): Payer: Medicare Other | Admitting: Internal Medicine

## 2012-03-19 ENCOUNTER — Encounter: Payer: Self-pay | Admitting: Internal Medicine

## 2012-03-19 VITALS — BP 138/80 | HR 80 | Temp 97.6°F | Resp 16 | Wt 172.0 lb

## 2012-03-19 DIAGNOSIS — F459 Somatoform disorder, unspecified: Secondary | ICD-10-CM

## 2012-03-19 DIAGNOSIS — I10 Essential (primary) hypertension: Secondary | ICD-10-CM

## 2012-03-19 DIAGNOSIS — R55 Syncope and collapse: Secondary | ICD-10-CM
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
## 2012-03-19 NOTE — Assessment & Plan Note (Signed)
Continue with current prescription therapy as reflected on the Med list.  

## 2012-03-19 NOTE — Assessment & Plan Note (Signed)
Multiple complaints and symptoms, including near syncope Last syncope 03/18/12
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
## 2012-03-24 ENCOUNTER — Telehealth: Payer: Self-pay | Admitting: Oncology

## 2012-03-31 ENCOUNTER — Telehealth: Payer: Self-pay | Admitting: *Deleted

## 2012-03-31 NOTE — Telephone Encounter (Signed)
Noted. Thx.

## 2012-03-31 NOTE — Telephone Encounter (Signed)
Left msg on triage stating wanting to inform md went out & saw pt yesterday. Daughter Alice Wong) states mom doesn't need a nurse. Wanting therapy. Will be d/c pt for nursing and sending out a OT for shoulder problems...Raechel Chute

## 2012-04-01 DIAGNOSIS — I10 Essential (primary) hypertension: Secondary | ICD-10-CM

## 2012-04-01 DIAGNOSIS — M545 Low back pain, unspecified: Secondary | ICD-10-CM

## 2012-04-01 DIAGNOSIS — R55 Syncope and collapse: Secondary | ICD-10-CM

## 2012-04-01 DIAGNOSIS — M810 Age-related osteoporosis without current pathological fracture: Secondary | ICD-10-CM

## 2012-04-03 ENCOUNTER — Ambulatory Visit: Payer: Medicare Other | Admitting: Physician Assistant

## 2012-04-03 ENCOUNTER — Other Ambulatory Visit: Payer: Medicare Other | Admitting: Lab

## 2012-04-07 ENCOUNTER — Ambulatory Visit: Payer: Medicare Other | Admitting: Oncology

## 2012-04-07 ENCOUNTER — Other Ambulatory Visit: Payer: Medicare Other | Admitting: Lab

## 2012-04-08 ENCOUNTER — Encounter: Payer: Self-pay | Admitting: Internal Medicine

## 2012-04-08 ENCOUNTER — Ambulatory Visit (INDEPENDENT_AMBULATORY_CARE_PROVIDER_SITE_OTHER): Payer: Medicare Other | Admitting: Internal Medicine

## 2012-04-08 VITALS — BP 160/84 | HR 80 | Temp 97.0°F | Resp 16 | Wt 176.0 lb

## 2012-04-08 DIAGNOSIS — L570 Actinic keratosis: Secondary | ICD-10-CM

## 2012-04-08 DIAGNOSIS — I10 Essential (primary) hypertension: Secondary | ICD-10-CM

## 2012-04-08 DIAGNOSIS — F459 Somatoform disorder, unspecified: Secondary | ICD-10-CM

## 2012-04-08 DIAGNOSIS — R269 Unspecified abnormalities of gait and mobility: Secondary | ICD-10-CM

## 2012-04-08 DIAGNOSIS — M545 Low back pain, unspecified: Secondary | ICD-10-CM

## 2012-04-08 DIAGNOSIS — F411 Generalized anxiety disorder: Secondary | ICD-10-CM

## 2012-04-08 NOTE — Assessment & Plan Note (Signed)
Continue with current prescription therapy as reflected on the Med list.  

## 2012-04-08 NOTE — Assessment & Plan Note (Signed)
Discussed  Continue with current prescription therapy as reflected on the Med list.  

## 2012-04-08 NOTE — Assessment & Plan Note (Signed)
cryo 
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
## 2012-05-19 ENCOUNTER — Telehealth: Payer: Self-pay

## 2012-05-19 NOTE — Telephone Encounter (Signed)
PT called to inform MD that there has been some missed visits due to incidents of increased dizziness and vertigo. Pt is has however been progressing with treatment and will be discharged to out patient physical therapy at [facility].

## 2012-05-21 ENCOUNTER — Encounter: Payer: Self-pay | Admitting: Internal Medicine

## 2012-05-21 ENCOUNTER — Ambulatory Visit (INDEPENDENT_AMBULATORY_CARE_PROVIDER_SITE_OTHER): Payer: Medicare Other | Admitting: Internal Medicine

## 2012-05-21 ENCOUNTER — Ambulatory Visit (INDEPENDENT_AMBULATORY_CARE_PROVIDER_SITE_OTHER)
Admission: RE | Admit: 2012-05-21 | Discharge: 2012-05-21 | Disposition: A | Discharge status: Home / Self Care | Payer: Medicare Other | Source: Ambulatory Visit | Attending: Internal Medicine | Admitting: Internal Medicine

## 2012-05-21 VITALS — BP 194/118 | HR 80 | Temp 98.4°F | Resp 16 | Wt 174.0 lb

## 2012-05-21 DIAGNOSIS — R55 Syncope and collapse: Secondary | ICD-10-CM

## 2012-05-21 DIAGNOSIS — I10 Essential (primary) hypertension: Secondary | ICD-10-CM

## 2012-05-21 DIAGNOSIS — J209 Acute bronchitis, unspecified: Secondary | ICD-10-CM

## 2012-05-21 MED ORDER — LEVOFLOXACIN 500 MG PO TABS: 500.0000 mg | ORAL_TABLET | Freq: Every day | ORAL | Status: DC

## 2012-05-21 MED ORDER — PROMETHAZINE-CODEINE 6.25-10 MG/5ML PO SYRP: 5.0000 mL | ORAL_SOLUTION | ORAL | Status: DC | PRN
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
## 2012-05-21 NOTE — Assessment & Plan Note (Signed)
CXR Levaquin x 10 d Prom-cod

## 2012-05-21 NOTE — Assessment & Plan Note (Signed)
She did not take her meds this am Continue with current prescription therapy as reflected on the Med list.

## 2012-05-21 NOTE — Assessment & Plan Note (Signed)
No relapse lately 

## 2012-05-21 NOTE — Patient Instructions (Addendum)
Use over-the-counter  "cold" medicines  such as  "Afrin" nasal spray for nasal congestion as directed instead. Use" Delsym" or" Robitussin" cough syrup varietis for cough.  You can use plain "Tylenol" or "Advil" for fever, chills and achyness.  Please, make an appointment if you are not better or if you're worse.  

## 2012-05-21 NOTE — Assessment & Plan Note (Signed)
Start abx

## 2012-06-02 ENCOUNTER — Other Ambulatory Visit: Payer: Medicare Other

## 2012-06-04 ENCOUNTER — Ambulatory Visit (INDEPENDENT_AMBULATORY_CARE_PROVIDER_SITE_OTHER): Payer: Medicare Other | Admitting: Internal Medicine

## 2012-06-04 ENCOUNTER — Other Ambulatory Visit (INDEPENDENT_AMBULATORY_CARE_PROVIDER_SITE_OTHER): Payer: Medicare Other

## 2012-06-04 ENCOUNTER — Encounter: Payer: Self-pay | Admitting: Internal Medicine

## 2012-06-04 VITALS — BP 154/92 | HR 76 | Temp 97.7°F | Resp 16 | Wt 166.0 lb

## 2012-06-04 DIAGNOSIS — F459 Somatoform disorder, unspecified: Secondary | ICD-10-CM

## 2012-06-04 DIAGNOSIS — R51 Headache: Secondary | ICD-10-CM

## 2012-06-04 DIAGNOSIS — R7309 Other abnormal glucose: Secondary | ICD-10-CM

## 2012-06-04 DIAGNOSIS — R202 Paresthesia of skin: Secondary | ICD-10-CM

## 2012-06-04 DIAGNOSIS — K219 Gastro-esophageal reflux disease without esophagitis: Secondary | ICD-10-CM

## 2012-06-04 DIAGNOSIS — R209 Unspecified disturbances of skin sensation: Secondary | ICD-10-CM

## 2012-06-04 DIAGNOSIS — R739 Hyperglycemia, unspecified: Secondary | ICD-10-CM

## 2012-06-04 DIAGNOSIS — F329 Major depressive disorder, single episode, unspecified: Secondary | ICD-10-CM

## 2012-06-04 DIAGNOSIS — F3289 Other specified depressive episodes: Secondary | ICD-10-CM

## 2012-06-04 LAB — BASIC METABOLIC PANEL
BUN: 25 mg/dL — ABNORMAL HIGH (ref 6–23)
CO2: 27 mEq/L (ref 19–32)
Calcium: 9.4 mg/dL (ref 8.4–10.5)
Chloride: 102 mEq/L (ref 96–112)
Creatinine, Ser: 1.3 mg/dL — ABNORMAL HIGH (ref 0.4–1.2)
GFR: 44.31 mL/min — ABNORMAL LOW (ref >60.00)
Glucose, Bld: 142 mg/dL — ABNORMAL HIGH (ref 70–99)
Potassium: 4 mEq/L (ref 3.5–5.1)
Sodium: 137 mEq/L (ref 135–145)

## 2012-06-04 LAB — HEMOGLOBIN A1C: Hgb A1c MFr Bld: 6.5 % (ref 4.6–6.5)

## 2012-06-04 LAB — VITAMIN B12: Vitamin B-12: 352 pg/mL (ref 211–911)

## 2012-06-04 MED ORDER — ALPRAZOLAM 0.25 MG PO TABS: 0.2500 mg | ORAL_TABLET | Freq: Three times a day (TID) | ORAL | Status: AC | PRN

## 2012-06-04 NOTE — Assessment & Plan Note (Signed)
Continue with current prn prescription therapy as reflected on the Med list.  

## 2012-06-04 NOTE — Assessment & Plan Note (Signed)
Discussed w pt

## 2012-06-04 NOTE — Assessment & Plan Note (Signed)
Continue with current prescription therapy as reflected on the Med list.  
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
## 2012-06-11 ENCOUNTER — Ambulatory Visit: Payer: Medicare Other | Admitting: Internal Medicine

## 2012-07-09 ENCOUNTER — Other Ambulatory Visit: Payer: Self-pay | Admitting: *Deleted

## 2012-07-09 MED ORDER — LOSARTAN POTASSIUM 100 MG PO TABS: ORAL_TABLET | ORAL | Status: DC

## 2012-08-30 ENCOUNTER — Emergency Department (HOSPITAL_COMMUNITY): Payer: Medicare Other

## 2012-08-30 ENCOUNTER — Encounter (HOSPITAL_COMMUNITY): Payer: Self-pay | Admitting: Emergency Medicine

## 2012-08-30 ENCOUNTER — Emergency Department (HOSPITAL_COMMUNITY)
Admission: EM | Admit: 2012-08-30 | Discharge: 2012-08-30 | Disposition: A | Discharge status: Home / Self Care | Payer: Medicare Other | Attending: Emergency Medicine | Admitting: Emergency Medicine

## 2012-08-30 DIAGNOSIS — Z8739 Personal history of other diseases of the musculoskeletal system and connective tissue: Secondary | ICD-10-CM | POA: Insufficient documentation

## 2012-08-30 DIAGNOSIS — R519 Headache, unspecified: Secondary | ICD-10-CM

## 2012-08-30 DIAGNOSIS — Z8679 Personal history of other diseases of the circulatory system: Secondary | ICD-10-CM | POA: Insufficient documentation

## 2012-08-30 DIAGNOSIS — I10 Essential (primary) hypertension: Secondary | ICD-10-CM | POA: Insufficient documentation

## 2012-08-30 DIAGNOSIS — Z862 Personal history of diseases of the blood and blood-forming organs and certain disorders involving the immune mechanism: Secondary | ICD-10-CM | POA: Insufficient documentation

## 2012-08-30 DIAGNOSIS — R079 Chest pain, unspecified: Secondary | ICD-10-CM | POA: Insufficient documentation

## 2012-08-30 DIAGNOSIS — R0602 Shortness of breath: Secondary | ICD-10-CM | POA: Insufficient documentation

## 2012-08-30 DIAGNOSIS — Z8673 Personal history of transient ischemic attack (TIA), and cerebral infarction without residual deficits: Secondary | ICD-10-CM | POA: Insufficient documentation

## 2012-08-30 DIAGNOSIS — F3289 Other specified depressive episodes: Secondary | ICD-10-CM | POA: Insufficient documentation

## 2012-08-30 DIAGNOSIS — R42 Dizziness and giddiness: Secondary | ICD-10-CM | POA: Insufficient documentation

## 2012-08-30 DIAGNOSIS — F411 Generalized anxiety disorder: Secondary | ICD-10-CM | POA: Insufficient documentation

## 2012-08-30 DIAGNOSIS — Z8639 Personal history of other endocrine, nutritional and metabolic disease: Secondary | ICD-10-CM | POA: Insufficient documentation

## 2012-08-30 DIAGNOSIS — Z8619 Personal history of other infectious and parasitic diseases: Secondary | ICD-10-CM | POA: Insufficient documentation

## 2012-08-30 DIAGNOSIS — Z79899 Other long term (current) drug therapy: Secondary | ICD-10-CM | POA: Insufficient documentation

## 2012-08-30 DIAGNOSIS — R51 Headache: Secondary | ICD-10-CM | POA: Insufficient documentation

## 2012-08-30 DIAGNOSIS — M109 Gout, unspecified: Secondary | ICD-10-CM | POA: Insufficient documentation

## 2012-08-30 DIAGNOSIS — Z8719 Personal history of other diseases of the digestive system: Secondary | ICD-10-CM | POA: Insufficient documentation

## 2012-08-30 DIAGNOSIS — F329 Major depressive disorder, single episode, unspecified: Secondary | ICD-10-CM | POA: Insufficient documentation

## 2012-08-30 DIAGNOSIS — E559 Vitamin D deficiency, unspecified: Secondary | ICD-10-CM | POA: Insufficient documentation

## 2012-08-30 LAB — CBC WITH DIFFERENTIAL/PLATELET
Basophils Absolute: 0 10*3/uL (ref 0.0–0.1)
Basophils Relative: 0 % (ref 0–1)
Eosinophils Absolute: 0 10*3/uL (ref 0.0–0.7)
Eosinophils Relative: 0 % (ref 0–5)
HCT: 36.8 % (ref 36.0–46.0)
Hemoglobin: 13.5 g/dL (ref 12.0–15.0)
Lymphocytes Relative: 15 % (ref 12–46)
Lymphs Abs: 1.1 10*3/uL (ref 0.7–4.0)
MCH: 32.4 pg (ref 26.0–34.0)
MCHC: 36.7 g/dL — ABNORMAL HIGH (ref 30.0–36.0)
MCV: 88.2 fL (ref 78.0–100.0)
Monocytes Absolute: 0.4 10*3/uL (ref 0.1–1.0)
Monocytes Relative: 5 % (ref 3–12)
Neutro Abs: 5.8 10*3/uL (ref 1.7–7.7)
Neutrophils Relative %: 80 % — ABNORMAL HIGH (ref 43–77)
Platelets: 191 10*3/uL (ref 150–400)
RBC: 4.17 MIL/uL (ref 3.87–5.11)
RDW: 12.7 % (ref 11.5–15.5)
WBC: 7.2 10*3/uL (ref 4.0–10.5)

## 2012-08-30 LAB — COMPREHENSIVE METABOLIC PANEL
ALT: 11 U/L (ref 0–35)
AST: 16 U/L (ref 0–37)
Albumin: 3.7 g/dL (ref 3.5–5.2)
Alkaline Phosphatase: 57 U/L (ref 39–117)
BUN: 16 mg/dL (ref 6–23)
CO2: 25 mEq/L (ref 19–32)
Calcium: 9.3 mg/dL (ref 8.4–10.5)
Chloride: 107 mEq/L (ref 96–112)
Creatinine, Ser: 0.91 mg/dL (ref 0.50–1.10)
GFR calc Af Amer: 69 mL/min — ABNORMAL LOW (ref >90)
GFR calc non Af Amer: 60 mL/min — ABNORMAL LOW (ref >90)
Glucose, Bld: 120 mg/dL — ABNORMAL HIGH (ref 70–99)
Potassium: 4 mEq/L (ref 3.5–5.1)
Sodium: 141 mEq/L (ref 135–145)
Total Bilirubin: 0.4 mg/dL (ref 0.3–1.2)
Total Protein: 6 g/dL (ref 6.0–8.3)

## 2012-08-30 LAB — TROPONIN I: Troponin I: <0.3 ng/mL (ref <0.30)

## 2012-08-30 LAB — MAGNESIUM: Magnesium: 2.2 mg/dL (ref 1.5–2.5)

## 2012-08-30 MED ORDER — DEXAMETHASONE SODIUM PHOSPHATE 10 MG/ML IJ SOLN
10.0000 mg | Freq: Once | INTRAMUSCULAR | Status: AC
  Administered 2012-08-30: 10 mg via INTRAVENOUS
  Filled 2012-08-30: qty 1

## 2012-08-30 MED ORDER — PROCHLORPERAZINE EDISYLATE 5 MG/ML IJ SOLN
10.0000 mg | Freq: Four times a day (QID) | INTRAMUSCULAR | Status: DC | PRN
  Administered 2012-08-30: 10 mg via INTRAVENOUS
  Filled 2012-08-30: qty 2

## 2012-08-30 MED ORDER — ASPIRIN-ACETAMINOPHEN-CAFFEINE 250-250-65 MG PO TABS
2.0000 | ORAL_TABLET | Freq: Once | ORAL | Status: AC
  Administered 2012-08-30: 2 via ORAL
  Filled 2012-08-30: qty 2

## 2012-08-30 MED ORDER — DIPHENHYDRAMINE HCL 50 MG/ML IJ SOLN
25.0000 mg | Freq: Once | INTRAMUSCULAR | Status: AC
  Administered 2012-08-30: 25 mg via INTRAVENOUS
  Filled 2012-08-30: qty 1

## 2012-08-30 NOTE — ED Notes (Addendum)
1910  Received report from off going RN and introduced self to the pt.  No needs at this time but will continue to monitor the pt  1945  Called Pharmacy about the Excedrin and they are having to locate it and will have it to me shortly

## 2012-08-30 NOTE — ED Notes (Addendum)
Per [place] language interpreter: Pt had a syncopal episode.  She has had these episodes before. Pt constantly has headaches. Gets Rxs filled at [facility]. Pt's dizziness started last night.  She tried to sleep on the couch but the dizziness didn't resolve.  Pt lost consciousness around 1400 today. Pt's husband was an inpatient and released from rehab a few days ago.  "Maybe that contributed to my condition." "My right side hurts and I can't sleep and sometimes it's difficult to breathe." "These episodes happen often."  Pt stated she feels safe at home.  Also stated that her husband doesn't understand language (most likely d/t a stroke).  VS for EMS: 95% room air, HR 56, BP 139/78, CBG 129. 18g L forearm.  Pt's daughter = [name]. She is on her way, coming from [place].  [phone]

## 2012-08-30 NOTE — ED Provider Notes (Signed)
CSN: [id number]     Arrival date & time 08/30/12  1457 History     First MD Initiated Contact with Patient 08/30/12 1514     Chief Complaint  Patient presents with  . Loss of Consciousness   (Consider location/radiation/quality/duration/timing/severity/associated sxs/prior Treatment) The history is limited by a language barrier. A language interpreter was used.   25 six-year-old female with history of hypertension, hyperlipidemia, CVA, headaches, syncope presents with headache and episode of lightheadedness.  Patient and daughter report that she has been having episodes of headache and lightheadedness for 10 years, and has been evaluated multiple times by her primary care physician and emergency departments.  Patient reports that her headache today is the same as headaches she's had in the past and came to the ED for symptom relief. Reports it began this morning slowly and became more severe. After her headache began she notes that she began to feel lightheaded, and got down on the floor and crawled. Patient reports that her memory is going, but daughter, husband and patient believes she did not lose consciousness.  Patient notes that she does have nausea associated with these episodes, however denies vomiting today. Denies any seizure history or seizure-like activity. Denies any history of blood thinner use or neurologic symptoms.  Notes recent fall however unable to say when. Patient is frustrated that she continues to have these episodes over the last 10 years.  Daughter reports excedrin migraine works for her headaches however she did not take it today.  Patient also notes slight chest pain also present since this AM/nonexertional/nonpleuritic.   Past Medical History  Diagnosis Date  . Anxiety   . Depression   . GERD (gastroesophageal reflux disease)   . Hyperlipidemia   . Hypertension   . Osteoporosis   . CVA (cerebral vascular accident)   . Thyroid cyst   . Cholelithiasis   . MR  (mitral regurgitation)   . Memory loss   . Gout 2009  . Vitamin D deficiency   . H. pylori infection 2011  . Vaso-vagal reaction   . Thrombocytopenia   . Diverticulosis    Past Surgical History  Procedure Laterality Date  . Cholecystectomy  2010  . Lasik    . Cesarean section     Family History  Problem Relation Age of Onset  . Hypertension Other   . Stomach cancer Brother   . Ulcers Son   . Colon cancer Neg Hx    History  Substance Use Topics  . Smoking status: Never Smoker   . Smokeless tobacco: Never Used  . Alcohol Use: No   OB History   Grav Para Term Preterm Abortions TAB SAB Ect Mult Living                 Review of Systems  Constitutional: Negative for fever.  HENT: Negative for sore throat and neck pain.   Eyes: Negative for visual disturbance.  Respiratory: Positive for shortness of breath (sometimes). Negative for cough.   Cardiovascular: Negative for chest pain and leg swelling.  Gastrointestinal: Negative for abdominal pain.  Genitourinary: Negative for difficulty urinating.  Musculoskeletal: Negative for back pain.  Skin: Negative for rash.  Neurological: Positive for light-headedness and headaches. Negative for syncope (pt states she does not remember but does not believe so), facial asymmetry, speech difficulty and weakness.    Allergies  Morphine sulfate  Home Medications   Current Outpatient Rx  Name  Route  Sig  Dispense  Refill  . ALPRAZolam (  XANAX) 0.25 MG tablet   Oral   Take 0.25 mg by mouth 3 (three) times daily as needed for sleep or anxiety.         Marland Kitchen amLODipine (NORVASC) 5 MG tablet   Oral   Take 1 tablet (5 mg total) by mouth daily.   90 tablet   3   . carvedilol (COREG) 25 MG tablet   Oral   Take 25 mg by mouth 2 (two) times daily with a meal.         . Cholecalciferol 1000 UNITS tablet   Oral   Take 2,000 Units by mouth daily.           . Cyanocobalamin (VITAMIN B-12) 1000 MCG SUBL   Sublingual   Place 1  tablet under the tongue daily.           Marland Kitchen donepezil (ARICEPT) 5 MG tablet   Oral   Take 5 mg by mouth at bedtime.         Marland Kitchen FLUoxetine (PROZAC) 20 MG tablet   Oral   Take 1 tablet (20 mg total) by mouth daily.   30 tablet   11   . gabapentin (NEURONTIN) 100 MG capsule   Oral   Take 100 mg by mouth 2 (two) times daily.         . indomethacin (INDOCIN) 50 MG capsule   Oral   Take 50 mg by mouth 3 (three) times daily as needed (for gout).         Marland Kitchen losartan (COZAAR) 100 MG tablet   Oral   Take 100 mg by mouth daily.         . nitroGLYCERIN (NITROSTAT) 0.4 MG SL tablet   Sublingual   Place 0.4 mg under the tongue as needed.           . promethazine-codeine (PHENERGAN WITH CODEINE) 6.25-10 MG/5ML syrup   Oral   Take 5 mLs by mouth every 4 (four) hours as needed for cough.   300 mL   0    BP 132/65  Pulse 49  Temp(Src) 97.9 F (36.6 C) (Oral)  Resp 16  SpO2 96% Physical Exam  Nursing note and vitals reviewed. Constitutional: She is oriented to person, place, and time. She appears well-developed and well-nourished. No distress.  HENT:  Head: Normocephalic and atraumatic.  Mouth/Throat: Oropharynx is clear and moist. No oropharyngeal exudate.  Eyes: Conjunctivae and EOM are normal. Pupils are equal, round, and reactive to light.  Neck: Normal range of motion.  Cardiovascular: Normal rate, regular rhythm, normal heart sounds and intact distal pulses.  Exam reveals no gallop and no friction rub.   No murmur heard. Pulmonary/Chest: Effort normal and breath sounds normal. No respiratory distress. She has no wheezes. She has no rales.  Abdominal: Soft. She exhibits no distension. There is no tenderness. There is no guarding.  Musculoskeletal: She exhibits no edema and no tenderness.  Neurological: She is alert and oriented to person, place, and time. She has normal strength. No cranial nerve deficit or sensory deficit. Coordination normal. Abnormal gait: not  evaluated secondary to headache, family and pt report normal. GCS eye subscore is 4. GCS verbal subscore is 5. GCS motor subscore is 6.  Skin: Skin is warm and dry. No rash noted. She is not diaphoretic. No erythema.    ED Course   Procedures (including critical care time)  Labs Reviewed  CBC WITH DIFFERENTIAL - Abnormal; Notable for the following:  MCHC 36.7 (*)    Neutrophils Relative % 80 (*)    All other components within normal limits  COMPREHENSIVE METABOLIC PANEL - Abnormal; Notable for the following:    Glucose, Bld 120 (*)    GFR calc non Af Amer 60 (*)    GFR calc Af Amer 69 (*)    All other components within normal limits  TROPONIN I  MAGNESIUM   Dg Chest 2 View  08/30/2012   *RADIOLOGY REPORT*  Clinical Data: Syncope.  CHEST - 2 VIEW  Comparison: 05/21/2012  Findings: Heart and mediastinal contours are within normal limits. No focal opacities or effusions.  No acute bony abnormality.  IMPRESSION: No active cardiopulmonary disease.   Original Report Authenticated By: Charlett Nose, M.D.   Ct Head Wo Contrast  08/30/2012   *RADIOLOGY REPORT*  Clinical Data: Syncopal episode.  Headache.  CT HEAD WITHOUT CONTRAST  Technique:  Contiguous axial images were obtained from the base of the skull through the vertex without contrast.  Comparison: 07/01/2011  Findings: The brain has a normal appearance without evidence of accelerated atrophy, old or acute infarction, mass lesion, hemorrhage, hydrocephalus or extra-axial collection.  There is atherosclerotic calcification of the major vessels at the base of the brain.  The calvarium is unremarkable.  There is mucosal inflammation and a probable retention cyst in the left maxillary sinus.  IMPRESSION: Normal appearance of the brain.  Left maxillary sinus mucosal inflammation and probable retention cyst.   Original Report Authenticated By: Paulina Fusi, M.D.   Date: 08/31/2012  Rate: 59  Rhythm: normal sinus rhythm  QRS Axis: normal   Intervals: normal  ST/T Wave abnormalities: normal  Conduction Disutrbances:none  Narrative Interpretation:   Old EKG Reviewed: unchanged   1. Headache   2. Lightheadedness     MDM  26 six-year-old female with history of hypertension, hyperlipidemia, CVA, headaches, syncope presents with headache and episode of lightheadedness that she reports is the same as previous episodes of headache and lightheadedness.  Head CT and was performed to evaluate for possible hemorrhage given the severity of headache and history of possible recent fall, and showed a normal appearance of the brain. Given patient's description of this headache is the same as headache she's had in the past, have low suspicion for intracranial bleed not seen on CT.  No fevers indicate meningitis. Neurologic exam within normal limits. EKG performed and evaluated by me and does not show any signs of prolonged QT, deltoids, Brugada or acute ST abnormalities to indicate ischemia. Chest x-ray done given patient lightheadedness, and slight chest pain and showed no signs of heart failure, pneumothorax, pneumonia.  Troponin negative, and given patient's symptoms present for greater than 8 hours have low suspicion that lightheadedness is secondary to cardiac ischemia.  Electrolytes are within normal limits.  Patient was given a headache cocktail consisting of Reglan, and that Decadron, Benadryl with moderate improvement in pain however continued headache. Patient given 2 of Excedrin Migraine, with relief of headache. Recommended close outpatient followup regarding continued evaluation patient's episodes of headache and lightheadedness given that patient has had this for 10 years and has been seen her primary care physician.  Patient discharged in stable condition with understanding of reasons to return.   Rhae Lerner, MD 08/31/12 1351

## 2012-08-30 NOTE — ED Notes (Signed)
MD at bedside. 

## 2012-08-31 NOTE — ED Provider Notes (Signed)
I saw and evaluated the patient, reviewed the resident's note and I agree with the findings and plan.   Patient's HA resolved, and this has been an ongoing problem for years. Daughter feels comfortable going home with outpatient f/u.  Audree Camel, MD 08/31/12 [phone]

## 2012-09-02 ENCOUNTER — Encounter: Payer: Self-pay | Admitting: Internal Medicine

## 2012-09-02 ENCOUNTER — Ambulatory Visit (INDEPENDENT_AMBULATORY_CARE_PROVIDER_SITE_OTHER): Payer: Medicare Other | Admitting: Internal Medicine

## 2012-09-02 VITALS — BP 180/90 | HR 60 | Temp 98.0°F | Resp 16 | Wt 160.0 lb

## 2012-09-02 DIAGNOSIS — R413 Other amnesia: Secondary | ICD-10-CM

## 2012-09-02 DIAGNOSIS — F459 Somatoform disorder, unspecified: Secondary | ICD-10-CM

## 2012-09-02 DIAGNOSIS — I10 Essential (primary) hypertension: Secondary | ICD-10-CM

## 2012-09-02 DIAGNOSIS — F3289 Other specified depressive episodes: Secondary | ICD-10-CM

## 2012-09-02 DIAGNOSIS — R269 Unspecified abnormalities of gait and mobility: Secondary | ICD-10-CM

## 2012-09-02 DIAGNOSIS — D696 Thrombocytopenia, unspecified: Secondary | ICD-10-CM

## 2012-09-02 DIAGNOSIS — R51 Headache: Secondary | ICD-10-CM

## 2012-09-02 DIAGNOSIS — F329 Major depressive disorder, single episode, unspecified: Secondary | ICD-10-CM

## 2012-09-02 NOTE — Assessment & Plan Note (Signed)
Discussed.

## 2012-09-02 NOTE — Assessment & Plan Note (Signed)
Continue with current prescription therapy as reflected on the Med list.  

## 2012-09-02 NOTE — Assessment & Plan Note (Signed)
Watching  
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
## 2012-09-02 NOTE — Assessment & Plan Note (Signed)
Chronic HA, post head trauma, migraines Continue with current prescription prn therapy as reflected on the Med list.

## 2012-11-04 ENCOUNTER — Encounter: Payer: Self-pay | Admitting: Internal Medicine

## 2012-11-04 ENCOUNTER — Other Ambulatory Visit (INDEPENDENT_AMBULATORY_CARE_PROVIDER_SITE_OTHER): Payer: Medicare Other

## 2012-11-04 ENCOUNTER — Ambulatory Visit (INDEPENDENT_AMBULATORY_CARE_PROVIDER_SITE_OTHER): Payer: Medicare Other | Admitting: Internal Medicine

## 2012-11-04 VITALS — BP 150/82 | HR 76 | Temp 97.0°F | Resp 16 | Wt 160.0 lb

## 2012-11-04 DIAGNOSIS — F411 Generalized anxiety disorder: Secondary | ICD-10-CM

## 2012-11-04 DIAGNOSIS — R739 Hyperglycemia, unspecified: Secondary | ICD-10-CM

## 2012-11-04 DIAGNOSIS — I10 Essential (primary) hypertension: Secondary | ICD-10-CM

## 2012-11-04 DIAGNOSIS — R7309 Other abnormal glucose: Secondary | ICD-10-CM

## 2012-11-04 DIAGNOSIS — E1165 Type 2 diabetes mellitus with hyperglycemia: Secondary | ICD-10-CM | POA: Insufficient documentation

## 2012-11-04 DIAGNOSIS — M545 Low back pain, unspecified: Secondary | ICD-10-CM

## 2012-11-04 DIAGNOSIS — E1129 Type 2 diabetes mellitus with other diabetic kidney complication: Secondary | ICD-10-CM | POA: Insufficient documentation

## 2012-11-04 DIAGNOSIS — F459 Somatoform disorder, unspecified: Secondary | ICD-10-CM

## 2012-11-04 LAB — BASIC METABOLIC PANEL
BUN: 14 mg/dL (ref 6–23)
CO2: 28 mEq/L (ref 19–32)
Calcium: 9.5 mg/dL (ref 8.4–10.5)
Chloride: 106 mEq/L (ref 96–112)
Creatinine, Ser: 1 mg/dL (ref 0.4–1.2)
GFR: 56.6 mL/min — ABNORMAL LOW (ref >60.00)
Glucose, Bld: 111 mg/dL — ABNORMAL HIGH (ref 70–99)
Potassium: 4.6 mEq/L (ref 3.5–5.1)
Sodium: 141 mEq/L (ref 135–145)

## 2012-11-04 LAB — HEMOGLOBIN A1C: Hgb A1c MFr Bld: 6 % (ref 4.6–6.5)

## 2012-11-04 MED ORDER — TRAZODONE HCL 50 MG PO TABS: ORAL_TABLET | ORAL | Status: DC

## 2012-11-04 MED ORDER — NITROGLYCERIN 0.4 MG SL SUBL: 0.4000 mg | SUBLINGUAL_TABLET | SUBLINGUAL | Status: DC | PRN

## 2012-11-04 NOTE — Assessment & Plan Note (Signed)
Stable lately 

## 2012-11-04 NOTE — Assessment & Plan Note (Signed)
Continue with current prescription therapy as reflected on the Med list.  

## 2012-11-04 NOTE — Assessment & Plan Note (Signed)
A1c
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
## 2012-11-04 NOTE — Patient Instructions (Signed)
Hold Indomethacin

## 2012-11-04 NOTE — Assessment & Plan Note (Signed)
Labs Continue with current prescription therapy as reflected on the Med list. BP Readings from Last 3 Encounters:  11/04/12 150/82  09/02/12 180/90  08/30/12 132/65

## 2012-11-21 ENCOUNTER — Ambulatory Visit (INDEPENDENT_AMBULATORY_CARE_PROVIDER_SITE_OTHER): Payer: Medicare Other | Admitting: Family Medicine

## 2012-11-21 ENCOUNTER — Ambulatory Visit: Payer: Medicare Other

## 2012-11-21 ENCOUNTER — Ambulatory Visit (HOSPITAL_COMMUNITY)
Admission: RE | Admit: 2012-11-21 | Discharge: 2012-11-21 | Disposition: A | Discharge status: Home / Self Care | Payer: Medicare Other | Source: Ambulatory Visit | Attending: Family Medicine | Admitting: Family Medicine

## 2012-11-21 VITALS — BP 140/80 | HR 65 | Temp 98.5°F | Resp 16

## 2012-11-21 DIAGNOSIS — R42 Dizziness and giddiness: Secondary | ICD-10-CM

## 2012-11-21 DIAGNOSIS — R29818 Other symptoms and signs involving the nervous system: Secondary | ICD-10-CM | POA: Insufficient documentation

## 2012-11-21 DIAGNOSIS — W19XXXA Unspecified fall, initial encounter: Secondary | ICD-10-CM | POA: Insufficient documentation

## 2012-11-21 DIAGNOSIS — S060X9A Concussion with loss of consciousness of unspecified duration, initial encounter: Secondary | ICD-10-CM

## 2012-11-21 DIAGNOSIS — M79641 Pain in right hand: Secondary | ICD-10-CM

## 2012-11-21 DIAGNOSIS — R110 Nausea: Secondary | ICD-10-CM

## 2012-11-21 DIAGNOSIS — M25562 Pain in left knee: Secondary | ICD-10-CM

## 2012-11-21 DIAGNOSIS — J320 Chronic maxillary sinusitis: Secondary | ICD-10-CM | POA: Insufficient documentation

## 2012-11-21 DIAGNOSIS — M79609 Pain in unspecified limb: Secondary | ICD-10-CM

## 2012-11-21 DIAGNOSIS — M25569 Pain in unspecified knee: Secondary | ICD-10-CM

## 2012-11-21 MED ORDER — ONDANSETRON 4 MG PO TBDP
4.0000 mg | ORAL_TABLET | Freq: Once | ORAL | Status: AC
Start: 2012-11-21 — End: 2012-11-21
  Administered 2012-11-21: 4 mg via ORAL

## 2012-11-21 MED ORDER — ONDANSETRON 4 MG PO TBDP: 4.0000 mg | ORAL_TABLET | Freq: Three times a day (TID) | ORAL | Status: DC | PRN

## 2012-11-21 NOTE — Progress Notes (Signed)
Subjective:    Patient ID: Alice Wong, female    DOB: 04-Feb-1934, 78 y.o.   MRN: [id number] Chief Complaint  Patient presents with  . Fall    right elbow, left ankle, left side of frontal head x today  . Loss of Consciousness   HPI  [name] is a 78 yo woman with a PMHx significant for syncopal episodes of unknown etiology.  She was walking behind her daughter leaving [employer] when she suddenly fell to her left side, reached across and tried to catch herself with the right hand but hit the left side of her head/temple area and landed on her left hip and knee.  She is not sure if she felt lightheaded and passed out or just tripped but there was no witnessed LOC. She does not speak [place] and her daughter, Alice Wong, is here translating for her.  She lives in [place] with her husband who is 15 yo and disabled and her daughter lives here in [place]. Her PCP is Dr. Posey Rea whose records are available in Epic and confirm her syncopal hx.  [name] is having a bad HA from the fall. She does have chronic HAs which are usually treated with Excedrin Migraine - she states she did take some earlier today. She is also feeling very nauseas since the fall but has not vomited.  She is complaining of pain in her left hip, knee, and right hand.  Past Medical History  Diagnosis Date  . Anxiety   . Depression   . GERD (gastroesophageal reflux disease)   . Hyperlipidemia   . Hypertension   . Osteoporosis   . CVA (cerebral vascular accident)   . Thyroid cyst   . Cholelithiasis   . MR (mitral regurgitation)   . Memory loss   . Gout 2009  . Vitamin D deficiency   . H. pylori infection 2011  . Vaso-vagal reaction   . Thrombocytopenia   . Diverticulosis    Current Outpatient Prescriptions on File Prior to Visit  Medication Sig Dispense Refill  . ALPRAZolam (XANAX) 0.25 MG tablet Take 0.25 mg by mouth 3 (three) times daily as needed for sleep or anxiety.      Marland Kitchen amLODipine (NORVASC) 5 MG tablet  Take 1 tablet (5 mg total) by mouth daily.  90 tablet  3  . Cholecalciferol 1000 UNITS tablet Take 2,000 Units by mouth daily.        . Cyanocobalamin (VITAMIN B-12) 1000 MCG SUBL Place 1 tablet under the tongue daily.        Marland Kitchen FLUoxetine (PROZAC) 20 MG tablet Take 1 tablet (20 mg total) by mouth daily.  30 tablet  11  . gabapentin (NEURONTIN) 100 MG capsule Take 100 mg by mouth 2 (two) times daily.      . indomethacin (INDOCIN) 50 MG capsule Take 50 mg by mouth 3 (three) times daily as needed (for gout).      Marland Kitchen losartan (COZAAR) 100 MG tablet Take 100 mg by mouth daily.      . nitroGLYCERIN (NITROSTAT) 0.4 MG SL tablet Place 1 tablet (0.4 mg total) under the tongue as needed.  20 tablet  3  . traZODone (DESYREL) 50 MG tablet 1 po qhs  30 tablet  5   No current facility-administered medications on file prior to visit.   Allergies  Allergen Reactions  . Aricept [Donepezil Hcl]     ?dry tongue  . Coreg [Carvedilol]     ?problems  . Morphine Sulfate  Other (See Comments)    REACTION: decreased blood pressure    Review of Systems  Constitutional: Negative for fever, chills, activity change, appetite change and unexpected weight change.  HENT: Positive for tinnitus.   Respiratory: Negative for chest tightness and shortness of breath.   Cardiovascular: Negative for chest pain, palpitations and leg swelling.  Gastrointestinal: Positive for nausea. Negative for vomiting, abdominal pain and abdominal distention.  Musculoskeletal: Positive for arthralgias, back pain, gait problem, joint swelling, myalgias, neck pain and neck stiffness.  Skin: Negative for color change and wound.  Neurological: Positive for dizziness, tremors, syncope, weakness, light-headedness and headaches. Negative for speech difficulty and numbness.  Hematological: Bruises/bleeds easily.  Psychiatric/Behavioral: Positive for behavioral problems, confusion and agitation.      BP 140/80  Pulse 65  Temp(Src) 98.5 F (36.9  C) (Oral)  Resp 16  SpO2 95% Objective:   Physical Exam  Constitutional: She appears well-developed and well-nourished. No distress.  HENT:  Head: Normocephalic and atraumatic. Head is without raccoon's eyes, without Battle's sign, without abrasion, without contusion, without laceration, without right periorbital erythema and without left periorbital erythema. Hair is normal.    Right Ear: External ear normal.  Left Ear: External ear normal.  Tenderness to palp over left parietal area where her head struck the ground, no abnormality seen or palpated.  Eyes: Conjunctivae are normal. No scleral icterus.  Neck: Normal range of motion. Neck supple. No thyromegaly present.  Cardiovascular: Normal rate, regular rhythm, normal heart sounds and intact distal pulses.   Pulmonary/Chest: Effort normal and breath sounds normal. No respiratory distress.  Musculoskeletal: She exhibits no edema.       Right hip: She exhibits tenderness. She exhibits normal range of motion.       Left hip: She exhibits tenderness. She exhibits normal range of motion.       Left knee: She exhibits swelling and ecchymosis. She exhibits normal range of motion, no effusion, no deformity, no laceration, no erythema and normal alignment. Tenderness found. Lateral joint line and LCL tenderness noted. No medial joint line, no MCL and no patellar tendon tenderness noted.       Cervical back: Normal. She exhibits normal range of motion, no tenderness and no bony tenderness.       Right hand: She exhibits tenderness and bony tenderness. She exhibits normal range of motion, no deformity, no laceration and no swelling. Decreased strength noted. She exhibits finger abduction and thumb/finger opposition.  Neg Spurlings  Lymphadenopathy:    She has no cervical adenopathy.  Neurological: She is alert. She has normal strength. She is not disoriented. No cranial nerve deficit. She exhibits normal muscle tone. Coordination and gait abnormal.   Reflex Scores:      Bicep reflexes are 2+ on the right side and 2+ on the left side.      Brachioradialis reflexes are 2+ on the right side and 2+ on the left side. Positive romberg, neg pronator drift. Pt VERY ataxic - cannot walk or stand on own. Exam difficult as pt spoke [place], daughter was translating but pt not fully cooperative.  Skin: Skin is warm and dry. She is not diaphoretic. No erythema.  Psychiatric: She has a normal mood and affect. Her behavior is normal.   EKG: NSR, no acute ischemic changes   [facility] reading (PRIMARY) by  Dr. Clelia Croft. Left knee xray: no acute abnormality Right hand xray: no acute abnormality Assessment & Plan:  Concussion with loss of consciousness, initial encounter - Plan:  CT Head Wo Contrast, EKG 12-Lead, CANCELED: POCT CBC, CANCELED: POCT glucose (manual entry), CANCELED: Comprehensive metabolic panel - Was going to obtain labs but by error pt was discharged from clinic and sent to get head CT before any blood was drawn so labs were cancelled. Call report on head CT was normal.  Spoke w/ daughter - she cannot stay w/ her mother tonight but will have a [place] speaking neighbor check on her mother every few hours - gave list of concussion warning signs to call with or call 911 inc but not limited to sedation, nausea/vomiting, change in vision, worsening HA, worsening gait instability, etc.  Knee pain, acute, left - Plan: DG Knee Complete 4 Views Left - appears to be soft-tissue injury - RICE  Hand pain, right - Plan: DG Hand Complete Right  Nausea alone - Plan: ondansetron (ZOFRAN-ODT) disintegrating tablet 4 mg given x 1 in clinic and ok to cont prn.  Meds ordered this encounter  Medications  . ondansetron (ZOFRAN-ODT) disintegrating tablet 4 mg    Sig:   . ondansetron (ZOFRAN ODT) 4 MG disintegrating tablet    Sig: Take 1 tablet (4 mg total) by mouth every 8 (eight) hours as needed for nausea or vomiting.    Dispense:  20 tablet    Refill:  0   Norberto Sorenson, MD MPH

## 2012-11-24 ENCOUNTER — Encounter: Payer: Self-pay | Admitting: Internal Medicine

## 2012-11-24 ENCOUNTER — Ambulatory Visit (INDEPENDENT_AMBULATORY_CARE_PROVIDER_SITE_OTHER)
Admission: RE | Admit: 2012-11-24 | Discharge: 2012-11-24 | Disposition: A | Discharge status: Home / Self Care | Payer: Medicare Other | Source: Ambulatory Visit | Attending: Internal Medicine | Admitting: Internal Medicine

## 2012-11-24 ENCOUNTER — Ambulatory Visit (INDEPENDENT_AMBULATORY_CARE_PROVIDER_SITE_OTHER): Payer: Medicare Other | Admitting: Internal Medicine

## 2012-11-24 VITALS — BP 188/88 | HR 57 | Temp 97.3°F | Wt 160.0 lb

## 2012-11-24 DIAGNOSIS — M545 Low back pain, unspecified: Secondary | ICD-10-CM

## 2012-11-24 DIAGNOSIS — I10 Essential (primary) hypertension: Secondary | ICD-10-CM

## 2012-11-24 DIAGNOSIS — S060X9D Concussion with loss of consciousness of unspecified duration, subsequent encounter: Secondary | ICD-10-CM

## 2012-11-24 DIAGNOSIS — Z5189 Encounter for other specified aftercare: Secondary | ICD-10-CM

## 2012-11-24 DIAGNOSIS — R55 Syncope and collapse: Secondary | ICD-10-CM

## 2012-11-24 DIAGNOSIS — S060X9A Concussion with loss of consciousness of unspecified duration, initial encounter: Secondary | ICD-10-CM | POA: Insufficient documentation

## 2012-11-24 MED ORDER — BUTALBITAL-APAP-CAFFEINE 50-325-40 MG PO TABS: 1.0000 | ORAL_TABLET | Freq: Two times a day (BID) | ORAL | Status: DC | PRN

## 2012-11-24 NOTE — Assessment & Plan Note (Signed)
Rest Zofran prn

## 2012-11-24 NOTE — Assessment & Plan Note (Signed)
Continue with current prescription therapy as reflected on the Med list.  

## 2012-11-24 NOTE — Assessment & Plan Note (Signed)
11/14 s/p fall  X ray LS spine
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
## 2012-11-24 NOTE — Assessment & Plan Note (Signed)
Relapsing  

## 2012-11-27 ENCOUNTER — Telehealth: Payer: Self-pay | Admitting: *Deleted

## 2012-11-27 NOTE — Telephone Encounter (Signed)
error 

## 2012-12-07 ENCOUNTER — Telehealth: Payer: Self-pay | Admitting: *Deleted

## 2012-12-07 ENCOUNTER — Other Ambulatory Visit: Payer: Self-pay | Admitting: Internal Medicine

## 2012-12-07 NOTE — Telephone Encounter (Signed)
[name], pts daughter, called requesting whether pt is to be taking Gabapentin; if so she needs a refill.  Please advise

## 2012-12-08 MED ORDER — GABAPENTIN 100 MG PO CAPS: 100.0000 mg | ORAL_CAPSULE | Freq: Two times a day (BID) | ORAL | Status: DC

## 2012-12-08 NOTE — Telephone Encounter (Signed)
Spoke with Alice Wong advised Rx sent

## 2012-12-08 NOTE — Telephone Encounter (Signed)
Yes OK to ref x 6 Thx

## 2012-12-11 ENCOUNTER — Telehealth: Payer: Self-pay

## 2012-12-11 NOTE — Telephone Encounter (Signed)
Xanax was phoned into [facility] listed on patients chart. /met

## 2013-02-04 ENCOUNTER — Other Ambulatory Visit (INDEPENDENT_AMBULATORY_CARE_PROVIDER_SITE_OTHER): Payer: Medicare Other

## 2013-02-04 ENCOUNTER — Encounter: Payer: Self-pay | Admitting: Internal Medicine

## 2013-02-04 ENCOUNTER — Ambulatory Visit (INDEPENDENT_AMBULATORY_CARE_PROVIDER_SITE_OTHER): Payer: Medicare Other | Admitting: Internal Medicine

## 2013-02-04 VITALS — BP 142/80 | HR 76 | Temp 97.2°F | Resp 16 | Wt 165.0 lb

## 2013-02-04 DIAGNOSIS — M545 Low back pain, unspecified: Secondary | ICD-10-CM

## 2013-02-04 DIAGNOSIS — F411 Generalized anxiety disorder: Secondary | ICD-10-CM

## 2013-02-04 DIAGNOSIS — R739 Hyperglycemia, unspecified: Secondary | ICD-10-CM

## 2013-02-04 DIAGNOSIS — F459 Somatoform disorder, unspecified: Secondary | ICD-10-CM

## 2013-02-04 DIAGNOSIS — F329 Major depressive disorder, single episode, unspecified: Secondary | ICD-10-CM | POA: Diagnosis not present

## 2013-02-04 DIAGNOSIS — F3289 Other specified depressive episodes: Secondary | ICD-10-CM

## 2013-02-04 DIAGNOSIS — I10 Essential (primary) hypertension: Secondary | ICD-10-CM

## 2013-02-04 DIAGNOSIS — R7309 Other abnormal glucose: Secondary | ICD-10-CM

## 2013-02-04 DIAGNOSIS — E785 Hyperlipidemia, unspecified: Secondary | ICD-10-CM | POA: Diagnosis not present

## 2013-02-04 LAB — BASIC METABOLIC PANEL
BUN: 19 mg/dL (ref 6–23)
CO2: 28 mEq/L (ref 19–32)
Calcium: 9.3 mg/dL (ref 8.4–10.5)
Chloride: 105 mEq/L (ref 96–112)
Creatinine, Ser: 1 mg/dL (ref 0.4–1.2)
GFR: 58.57 mL/min — ABNORMAL LOW (ref >60.00)
Glucose, Bld: 122 mg/dL — ABNORMAL HIGH (ref 70–99)
Potassium: 4.1 mEq/L (ref 3.5–5.1)
Sodium: 138 mEq/L (ref 135–145)

## 2013-02-04 LAB — HEMOGLOBIN A1C: Hgb A1c MFr Bld: 5.8 % (ref 4.6–6.5)

## 2013-02-04 MED ORDER — FLUOXETINE HCL 20 MG PO TABS: 20.0000 mg | ORAL_TABLET | Freq: Every day | ORAL | Status: DC

## 2013-02-04 MED ORDER — GABAPENTIN 100 MG PO CAPS: 100.0000 mg | ORAL_CAPSULE | Freq: Two times a day (BID) | ORAL | Status: DC

## 2013-02-04 MED ORDER — LOSARTAN POTASSIUM 100 MG PO TABS: 100.0000 mg | ORAL_TABLET | Freq: Every day | ORAL | Status: DC

## 2013-02-04 NOTE — Assessment & Plan Note (Signed)
Labs

## 2013-02-04 NOTE — Assessment & Plan Note (Signed)
Continue with current prescription therapy as reflected on the Med list.  

## 2013-02-04 NOTE — Progress Notes (Signed)
Pre visit review using our clinic review tool, if applicable. No additional management support is needed unless otherwise documented below in the visit note. 

## 2013-02-04 NOTE — Assessment & Plan Note (Signed)
Discussed.

## 2013-02-04 NOTE — Assessment & Plan Note (Signed)
  On diet  

## 2013-02-05 ENCOUNTER — Encounter: Payer: Self-pay | Admitting: Internal Medicine
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
## 2013-05-06 ENCOUNTER — Other Ambulatory Visit (INDEPENDENT_AMBULATORY_CARE_PROVIDER_SITE_OTHER): Payer: Medicare Other

## 2013-05-06 ENCOUNTER — Ambulatory Visit (INDEPENDENT_AMBULATORY_CARE_PROVIDER_SITE_OTHER): Payer: Medicare Other | Admitting: Internal Medicine

## 2013-05-06 ENCOUNTER — Encounter: Payer: Self-pay | Admitting: Internal Medicine

## 2013-05-06 VITALS — BP 160/70 | HR 80 | Temp 97.1°F | Resp 16 | Wt 160.0 lb

## 2013-05-06 DIAGNOSIS — I10 Essential (primary) hypertension: Secondary | ICD-10-CM

## 2013-05-06 DIAGNOSIS — R42 Dizziness and giddiness: Secondary | ICD-10-CM

## 2013-05-06 DIAGNOSIS — E559 Vitamin D deficiency, unspecified: Secondary | ICD-10-CM

## 2013-05-06 DIAGNOSIS — M545 Low back pain, unspecified: Secondary | ICD-10-CM

## 2013-05-06 DIAGNOSIS — F411 Generalized anxiety disorder: Secondary | ICD-10-CM | POA: Diagnosis not present

## 2013-05-06 DIAGNOSIS — F3289 Other specified depressive episodes: Secondary | ICD-10-CM

## 2013-05-06 DIAGNOSIS — F329 Major depressive disorder, single episode, unspecified: Secondary | ICD-10-CM

## 2013-05-06 DIAGNOSIS — R55 Syncope and collapse: Secondary | ICD-10-CM

## 2013-05-06 LAB — BASIC METABOLIC PANEL
BUN: 14 mg/dL (ref 6–23)
CO2: 28 mEq/L (ref 19–32)
Calcium: 9.4 mg/dL (ref 8.4–10.5)
Chloride: 106 mEq/L (ref 96–112)
Creatinine, Ser: 0.8 mg/dL (ref 0.4–1.2)
GFR: 69.92 mL/min (ref >60.00)
Glucose, Bld: 116 mg/dL — ABNORMAL HIGH (ref 70–99)
Potassium: 3.7 mEq/L (ref 3.5–5.1)
Sodium: 141 mEq/L (ref 135–145)

## 2013-05-06 LAB — TSH: TSH: 2.57 u[IU]/mL (ref 0.35–5.50)

## 2013-05-06 MED ORDER — GABAPENTIN 300 MG PO CAPS: 300.0000 mg | ORAL_CAPSULE | Freq: Three times a day (TID) | ORAL | Status: DC

## 2013-05-06 MED ORDER — DONEPEZIL HCL 10 MG PO TABS: 10.0000 mg | ORAL_TABLET | Freq: Every day | ORAL | Status: DC
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
## 2013-05-06 NOTE — Assessment & Plan Note (Signed)
Continue with current prescription therapy as reflected on the Med list.  

## 2013-05-06 NOTE — Assessment & Plan Note (Signed)
Chronic w/panic attacks She has some degree of a conversion disorder - paste

## 2013-05-06 NOTE — Assessment & Plan Note (Signed)
On Gabapentin Risks associated with treatment noncompliance were discussed. Compliance was encouraged.

## 2013-05-06 NOTE — Assessment & Plan Note (Signed)
No chief complaint on file.
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
## 2013-05-06 NOTE — Assessment & Plan Note (Signed)
Better w/Gabapentin

## 2013-06-20 ENCOUNTER — Other Ambulatory Visit: Payer: Self-pay | Admitting: Internal Medicine

## 2013-07-23 ENCOUNTER — Ambulatory Visit (INDEPENDENT_AMBULATORY_CARE_PROVIDER_SITE_OTHER): Payer: Medicare Other | Admitting: Internal Medicine

## 2013-07-23 ENCOUNTER — Encounter: Payer: Self-pay | Admitting: Internal Medicine

## 2013-07-23 VITALS — BP 130/70 | HR 72 | Temp 98.2°F | Resp 16 | Wt 158.0 lb

## 2013-07-23 DIAGNOSIS — I10 Essential (primary) hypertension: Secondary | ICD-10-CM

## 2013-07-23 DIAGNOSIS — F459 Somatoform disorder, unspecified: Secondary | ICD-10-CM

## 2013-07-23 DIAGNOSIS — F329 Major depressive disorder, single episode, unspecified: Secondary | ICD-10-CM

## 2013-07-23 DIAGNOSIS — F411 Generalized anxiety disorder: Secondary | ICD-10-CM | POA: Diagnosis not present

## 2013-07-23 DIAGNOSIS — F3289 Other specified depressive episodes: Secondary | ICD-10-CM

## 2013-07-23 DIAGNOSIS — E559 Vitamin D deficiency, unspecified: Secondary | ICD-10-CM

## 2013-07-23 DIAGNOSIS — R55 Syncope and collapse: Secondary | ICD-10-CM | POA: Diagnosis not present

## 2013-07-23 MED ORDER — AZELASTINE HCL 0.05 % OP SOLN: 1.0000 [drp] | Freq: Two times a day (BID) | OPHTHALMIC | Status: DC

## 2013-07-23 NOTE — Assessment & Plan Note (Signed)
Discussed  Continue with current prescription therapy as reflected on the Med list.  
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
## 2013-07-23 NOTE — Assessment & Plan Note (Signed)
Continue with current prescription therapy as reflected on the Med list.  

## 2013-07-23 NOTE — Patient Instructions (Signed)
Loratidine 10 mg/day for allergies

## 2013-07-23 NOTE — Assessment & Plan Note (Signed)
Discussed.

## 2013-07-23 NOTE — Progress Notes (Signed)
Pre visit review using our clinic review tool, if applicable. No additional management support is needed unless otherwise documented below in the visit note. 

## 2013-07-25 ENCOUNTER — Other Ambulatory Visit: Payer: Self-pay | Admitting: Internal Medicine

## 2013-07-26 ENCOUNTER — Telehealth: Payer: Self-pay | Admitting: Internal Medicine

## 2013-07-26 NOTE — Telephone Encounter (Signed)
Relevant patient education assigned to patient using Emmi. ° °

## 2013-07-30 DIAGNOSIS — Z0279 Encounter for issue of other medical certificate: Secondary | ICD-10-CM

## 2013-10-02 ENCOUNTER — Other Ambulatory Visit: Payer: Self-pay | Admitting: Internal Medicine

## 2013-10-27 ENCOUNTER — Ambulatory Visit (INDEPENDENT_AMBULATORY_CARE_PROVIDER_SITE_OTHER): Payer: Medicare Other | Admitting: Internal Medicine

## 2013-10-27 ENCOUNTER — Encounter: Payer: Self-pay | Admitting: Internal Medicine

## 2013-10-27 VITALS — BP 150/74 | HR 66 | Temp 97.8°F | Wt 156.0 lb

## 2013-10-27 DIAGNOSIS — D696 Thrombocytopenia, unspecified: Secondary | ICD-10-CM

## 2013-10-27 DIAGNOSIS — F411 Generalized anxiety disorder: Secondary | ICD-10-CM | POA: Diagnosis not present

## 2013-10-27 DIAGNOSIS — I10 Essential (primary) hypertension: Secondary | ICD-10-CM | POA: Diagnosis not present

## 2013-10-27 DIAGNOSIS — R413 Other amnesia: Secondary | ICD-10-CM

## 2013-10-27 DIAGNOSIS — K2100 Gastro-esophageal reflux disease with esophagitis, without bleeding: Secondary | ICD-10-CM

## 2013-10-27 DIAGNOSIS — H6120 Impacted cerumen, unspecified ear: Secondary | ICD-10-CM | POA: Insufficient documentation

## 2013-10-27 DIAGNOSIS — H6121 Impacted cerumen, right ear: Secondary | ICD-10-CM

## 2013-10-27 DIAGNOSIS — D485 Neoplasm of uncertain behavior of skin: Secondary | ICD-10-CM

## 2013-10-27 DIAGNOSIS — G44329 Chronic post-traumatic headache, not intractable: Secondary | ICD-10-CM

## 2013-10-27 MED ORDER — ALPRAZOLAM 0.25 MG PO TABS: ORAL_TABLET | ORAL | Status: DC

## 2013-10-27 NOTE — Progress Notes (Signed)
Pre visit review using our clinic review tool, if applicable. No additional management support is needed unless otherwise documented below in the visit note. 

## 2013-10-27 NOTE — Assessment & Plan Note (Signed)
Continue with current prescription therapy as reflected on the Med list.  

## 2013-10-27 NOTE — Assessment & Plan Note (Signed)
Labs

## 2013-10-27 NOTE — Assessment & Plan Note (Signed)
Chronic HA, post head trauma, migraines Continue with current prn therapy as reflected on the Med list.

## 2013-10-27 NOTE — Assessment & Plan Note (Signed)
Risks associated with treatment noncompliance were discussed. Compliance was encouraged. 

## 2013-10-27 NOTE — Assessment & Plan Note (Signed)
Chronic w/panic attacks She has some degree of a conversion disorder

## 2013-10-27 NOTE — Assessment & Plan Note (Signed)
Skin bx 
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
## 2013-10-27 NOTE — Assessment & Plan Note (Signed)
R 10/15 Will irrigate

## 2013-10-29 ENCOUNTER — Other Ambulatory Visit: Payer: Self-pay

## 2013-12-17 ENCOUNTER — Encounter (HOSPITAL_COMMUNITY): Payer: Self-pay

## 2013-12-17 ENCOUNTER — Inpatient Hospital Stay (HOSPITAL_COMMUNITY)
Admission: EM | Admit: 2013-12-17 | Discharge: 2013-12-20 | DRG: 287 | Disposition: A | Discharge status: Home / Self Care | Payer: Medicare Other | Attending: Cardiology | Admitting: Cardiology

## 2013-12-17 ENCOUNTER — Ambulatory Visit (INDEPENDENT_AMBULATORY_CARE_PROVIDER_SITE_OTHER): Payer: Medicare Other | Admitting: Emergency Medicine

## 2013-12-17 ENCOUNTER — Emergency Department (HOSPITAL_COMMUNITY): Payer: Medicare Other

## 2013-12-17 ENCOUNTER — Other Ambulatory Visit: Payer: Self-pay

## 2013-12-17 DIAGNOSIS — M810 Age-related osteoporosis without current pathological fracture: Secondary | ICD-10-CM | POA: Diagnosis present

## 2013-12-17 DIAGNOSIS — I10 Essential (primary) hypertension: Secondary | ICD-10-CM | POA: Diagnosis present

## 2013-12-17 DIAGNOSIS — Z79899 Other long term (current) drug therapy: Secondary | ICD-10-CM | POA: Diagnosis not present

## 2013-12-17 DIAGNOSIS — M109 Gout, unspecified: Secondary | ICD-10-CM | POA: Diagnosis present

## 2013-12-17 DIAGNOSIS — R079 Chest pain, unspecified: Secondary | ICD-10-CM

## 2013-12-17 DIAGNOSIS — F329 Major depressive disorder, single episode, unspecified: Secondary | ICD-10-CM | POA: Diagnosis present

## 2013-12-17 DIAGNOSIS — Z9889 Other specified postprocedural states: Secondary | ICD-10-CM

## 2013-12-17 DIAGNOSIS — E785 Hyperlipidemia, unspecified: Secondary | ICD-10-CM | POA: Diagnosis present

## 2013-12-17 DIAGNOSIS — K219 Gastro-esophageal reflux disease without esophagitis: Secondary | ICD-10-CM | POA: Diagnosis present

## 2013-12-17 DIAGNOSIS — R0602 Shortness of breath: Secondary | ICD-10-CM | POA: Diagnosis not present

## 2013-12-17 DIAGNOSIS — I2510 Atherosclerotic heart disease of native coronary artery without angina pectoris: Secondary | ICD-10-CM | POA: Diagnosis present

## 2013-12-17 DIAGNOSIS — F419 Anxiety disorder, unspecified: Secondary | ICD-10-CM | POA: Diagnosis present

## 2013-12-17 DIAGNOSIS — R071 Chest pain on breathing: Secondary | ICD-10-CM

## 2013-12-17 DIAGNOSIS — R0789 Other chest pain: Secondary | ICD-10-CM | POA: Diagnosis not present

## 2013-12-17 DIAGNOSIS — F418 Other specified anxiety disorders: Secondary | ICD-10-CM | POA: Diagnosis present

## 2013-12-17 DIAGNOSIS — Z8673 Personal history of transient ischemic attack (TIA), and cerebral infarction without residual deficits: Secondary | ICD-10-CM | POA: Diagnosis not present

## 2013-12-17 DIAGNOSIS — R55 Syncope and collapse: Secondary | ICD-10-CM | POA: Diagnosis present

## 2013-12-17 DIAGNOSIS — R9439 Abnormal result of other cardiovascular function study: Secondary | ICD-10-CM | POA: Diagnosis not present

## 2013-12-17 HISTORY — DX: Other specified postprocedural states: Z98.890

## 2013-12-17 LAB — TROPONIN I
Troponin I: <0.3 ng/mL (ref <0.30)
Troponin I: <0.3 ng/mL (ref <0.30)

## 2013-12-17 LAB — COMPREHENSIVE METABOLIC PANEL
ALT: 10 U/L (ref 0–35)
AST: 14 U/L (ref 0–37)
Albumin: 4 g/dL (ref 3.5–5.2)
Alkaline Phosphatase: 56 U/L (ref 39–117)
Anion gap: 15 (ref 5–15)
BUN: 15 mg/dL (ref 6–23)
CO2: 22 mEq/L (ref 19–32)
Calcium: 9.4 mg/dL (ref 8.4–10.5)
Chloride: 104 mEq/L (ref 96–112)
Creatinine, Ser: 0.99 mg/dL (ref 0.50–1.10)
GFR calc Af Amer: 62 mL/min — ABNORMAL LOW (ref >90)
GFR calc non Af Amer: 54 mL/min — ABNORMAL LOW (ref >90)
Glucose, Bld: 110 mg/dL — ABNORMAL HIGH (ref 70–99)
Potassium: 4.2 mEq/L (ref 3.7–5.3)
Sodium: 141 mEq/L (ref 137–147)
Total Bilirubin: 0.8 mg/dL (ref 0.3–1.2)
Total Protein: 6.9 g/dL (ref 6.0–8.3)

## 2013-12-17 LAB — CBC WITH DIFFERENTIAL/PLATELET
Basophils Absolute: 0 10*3/uL (ref 0.0–0.1)
Basophils Relative: 0 % (ref 0–1)
Eosinophils Absolute: 0 10*3/uL (ref 0.0–0.7)
Eosinophils Relative: 0 % (ref 0–5)
HCT: 37.1 % (ref 36.0–46.0)
Hemoglobin: 13.2 g/dL (ref 12.0–15.0)
Lymphocytes Relative: 21 % (ref 12–46)
Lymphs Abs: 1.2 10*3/uL (ref 0.7–4.0)
MCH: 31 pg (ref 26.0–34.0)
MCHC: 35.6 g/dL (ref 30.0–36.0)
MCV: 87.1 fL (ref 78.0–100.0)
Monocytes Absolute: 0.3 10*3/uL (ref 0.1–1.0)
Monocytes Relative: 6 % (ref 3–12)
Neutro Abs: 4.1 10*3/uL (ref 1.7–7.7)
Neutrophils Relative %: 73 % (ref 43–77)
Platelets: 182 10*3/uL (ref 150–400)
RBC: 4.26 MIL/uL (ref 3.87–5.11)
RDW: 12.4 % (ref 11.5–15.5)
WBC: 5.6 10*3/uL (ref 4.0–10.5)

## 2013-12-17 LAB — PRO B NATRIURETIC PEPTIDE: Pro B Natriuretic peptide (BNP): 92.5 pg/mL (ref 0–450)

## 2013-12-17 LAB — PROTIME-INR
INR: 1.05 (ref 0.00–1.49)
Prothrombin Time: 13.8 seconds (ref 11.6–15.2)

## 2013-12-17 MED ORDER — GI COCKTAIL ~~LOC~~
30.0000 mL | Freq: Once | ORAL | Status: AC
  Administered 2013-12-17: 30 mL via ORAL

## 2013-12-17 MED ORDER — HEPARIN SODIUM (PORCINE) 5000 UNIT/ML IJ SOLN
5000.0000 [IU] | Freq: Three times a day (TID) | INTRAMUSCULAR | Status: DC
  Administered 2013-12-17 – 2013-12-19 (×7): 5000 [IU] via SUBCUTANEOUS
  Filled 2013-12-17 (×6): qty 1

## 2013-12-17 MED ORDER — SODIUM CHLORIDE 0.9 % IV BOLUS (SEPSIS)
500.0000 mL | Freq: Once | INTRAVENOUS | Status: AC
  Administered 2013-12-17: 500 mL via INTRAVENOUS

## 2013-12-17 MED ORDER — FLUOXETINE HCL 20 MG PO TABS
20.0000 mg | ORAL_TABLET | Freq: Every day | ORAL | Status: DC
  Administered 2013-12-18 – 2013-12-20 (×3): 20 mg via ORAL
  Filled 2013-12-17 (×5): qty 1

## 2013-12-17 MED ORDER — ASPIRIN EC 81 MG PO TBEC
81.0000 mg | DELAYED_RELEASE_TABLET | Freq: Every day | ORAL | Status: DC
  Administered 2013-12-18 – 2013-12-20 (×3): 81 mg via ORAL
  Filled 2013-12-17 (×2): qty 1

## 2013-12-17 MED ORDER — ALUM & MAG HYDROXIDE-SIMETH 200-200-20 MG/5ML PO SUSP
30.0000 mL | ORAL | Status: DC | PRN
  Administered 2013-12-18: 30 mL via ORAL
  Filled 2013-12-17: qty 30

## 2013-12-17 MED ORDER — ACETAMINOPHEN 325 MG PO TABS
650.0000 mg | ORAL_TABLET | ORAL | Status: DC | PRN
  Administered 2013-12-17 – 2013-12-20 (×6): 650 mg via ORAL
  Filled 2013-12-17 (×4): qty 2

## 2013-12-17 MED ORDER — LOSARTAN POTASSIUM 50 MG PO TABS
100.0000 mg | ORAL_TABLET | Freq: Every day | ORAL | Status: DC
  Administered 2013-12-18 – 2013-12-20 (×3): 100 mg via ORAL
  Filled 2013-12-17 (×2): qty 2

## 2013-12-17 MED ORDER — TRAZODONE HCL 50 MG PO TABS
50.0000 mg | ORAL_TABLET | Freq: Every day | ORAL | Status: DC
  Administered 2013-12-17 – 2013-12-19 (×3): 50 mg via ORAL
  Filled 2013-12-17 (×2): qty 1

## 2013-12-17 MED ORDER — NITROGLYCERIN 0.4 MG SL SUBL: 0.4000 mg | SUBLINGUAL_TABLET | SUBLINGUAL | Status: DC | PRN

## 2013-12-17 MED ORDER — VITAMIN B-12 1000 MCG PO TABS
1000.0000 ug | ORAL_TABLET | Freq: Every day | ORAL | Status: DC
  Administered 2013-12-18 – 2013-12-20 (×3): 1000 ug via ORAL
  Filled 2013-12-17 (×5): qty 1

## 2013-12-17 MED ORDER — BUTALBITAL-APAP-CAFFEINE 50-325-40 MG PO TABS
1.0000 | ORAL_TABLET | Freq: Two times a day (BID) | ORAL | Status: DC | PRN
  Administered 2013-12-19: 1 via ORAL
  Filled 2013-12-17: qty 1

## 2013-12-17 MED ORDER — VITAMIN D 1000 UNITS PO TABS
2000.0000 [IU] | ORAL_TABLET | Freq: Every day | ORAL | Status: DC
  Administered 2013-12-18 – 2013-12-20 (×3): 2000 [IU] via ORAL
  Filled 2013-12-17 (×2): qty 2

## 2013-12-17 MED ORDER — DONEPEZIL HCL 10 MG PO TABS
10.0000 mg | ORAL_TABLET | Freq: Every day | ORAL | Status: DC
  Administered 2013-12-17 – 2013-12-20 (×4): 10 mg via ORAL
  Filled 2013-12-17 (×3): qty 1

## 2013-12-17 MED ORDER — AMLODIPINE BESYLATE 5 MG PO TABS
5.0000 mg | ORAL_TABLET | Freq: Every day | ORAL | Status: DC
  Administered 2013-12-18 – 2013-12-20 (×3): 5 mg via ORAL
  Filled 2013-12-17 (×3): qty 1

## 2013-12-17 MED ORDER — GABAPENTIN 300 MG PO CAPS
300.0000 mg | ORAL_CAPSULE | Freq: Three times a day (TID) | ORAL | Status: DC
  Administered 2013-12-17 – 2013-12-20 (×10): 300 mg via ORAL
  Filled 2013-12-17 (×8): qty 1

## 2013-12-17 MED ORDER — ONDANSETRON 4 MG PO TBDP
4.0000 mg | ORAL_TABLET | Freq: Three times a day (TID) | ORAL | Status: DC | PRN
  Filled 2013-12-17: qty 1

## 2013-12-17 MED ORDER — ALPRAZOLAM 0.25 MG PO TABS: 0.2500 mg | ORAL_TABLET | Freq: Three times a day (TID) | ORAL | Status: DC | PRN

## 2013-12-17 MED ORDER — ONDANSETRON HCL 4 MG/2ML IJ SOLN: 4.0000 mg | Freq: Four times a day (QID) | INTRAMUSCULAR | Status: DC | PRN

## 2013-12-17 NOTE — Progress Notes (Signed)
   Subjective:  This chart was scribed for Nena Jordan, MD by Dellis Filbert, ED Scribe at [facility].The patient was seen in exam room 06 and the patient's care was started at 10:08 AM.   Patient ID: Lenda Kelp, female    DOB: 23-Feb-1934, 79 y.o.   MRN: [id number] No chief complaint on file.  HPI HPI Comments: OLAR SANTINI is a 79 y.o. female with a history of heart disease who presents to the Emergency Department complaining of sharp, stabbing CP onset 40 min ago.She rates the pain as 8/10. Pt states right now her chest feels heavy. Pt called her daughter and she was brought to the ED. She has had this pain before a few months ago. Pt has taken 2 nitroglycerins today for relief her last was at 9:14 AM. Pt does not take aspirin and is not allergic.  Review of Systems  Cardiovascular: Positive for chest pain. Negative for leg swelling.   `    Objective:  There were no vitals taken for this visit.  Physical Exam  Constitutional: She is oriented to person, place, and time. She appears well-developed and well-nourished. No distress.  HENT:  Head: Normocephalic and atraumatic.  Eyes: EOM are normal.  Neck: Normal range of motion.  Cardiovascular: Normal rate, regular rhythm and normal heart sounds.   No murmur heard. Pulmonary/Chest: Effort normal and breath sounds normal.  Abdominal: Soft.  Musculoskeletal: She exhibits no tenderness.  No tenderness in calf.  Neurological: She is alert and oriented to person, place, and time.  Skin: Skin is warm and dry. She is not diaphoretic.  Not diaphoretic. Skin is dry  Psychiatric: She has a normal mood and affect. Her behavior is normal.  Nursing note and vitals reviewed.     Assessment & Plan:  Pt given 4 baby aspirin. EKG does not show any acute  Changes.. Pt sent to hospital via EMS for rule out MI. She had 2 TNG prior to arrival..

## 2013-12-17 NOTE — ED Provider Notes (Signed)
Patient presented to the ER with chest pain. 6. Sating mid sternal chest pain which is sharp in nature, radiates to the left scapula.  Face to face Exam: HEENT - PERRLA Lungs - CTAB Heart - RRR, no M/R/G Abd - S/NT/ND Neuro - alert, oriented x3  Plan: EKG, troponin, cardiology consultation.   Orpah Greek, MD 12/17/13 1357

## 2013-12-17 NOTE — ED Notes (Signed)
Phlebotomy at the bedside  

## 2013-12-17 NOTE — ED Notes (Signed)
Attempted to call report

## 2013-12-17 NOTE — ED Notes (Signed)
Marissa,PA at the bedside.  

## 2013-12-17 NOTE — ED Notes (Signed)
Cardiology at the bedside.

## 2013-12-17 NOTE — ED Notes (Signed)
Per [facility], pt from [facility] for mid cp sharp in nature and radiating to left scapula, with stabbing pains to scapula. Had PVC's on [facility] EKG, placed on O2 and PVC subsided. VSS, 20g to LFA. Pt speaks [place], Daughter translating.
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
## 2013-12-17 NOTE — ED Provider Notes (Signed)
CSN: [id number]     Arrival date & time 12/17/13  1047 History   First MD Initiated Contact with Patient 12/17/13 1047     Chief Complaint  Patient presents with  . Chest Pain     (Consider location/radiation/quality/duration/timing/severity/associated sxs/prior Treatment) The history is provided by the patient and a relative. A language interpreter was used [employer] ).  Alice Wong is a 79 y/o F with PMHx of anxiety, depression, GERD, HLD, CVA, MR presenting to the ED with chest pain that has been ongoing for the past month with worsening symptoms that started this week. Patient reported that the pain is localized to the left side of the chest described as a stabbing pain and a pressure with radiation down her left arm and scapula. Stated that the pain increased today - reported that patient took 2 Nitro at home and then went to [facility] where 4 ASA were administered. Patient reported that these chest pains have been intermittent and stated that when she experiences these chest pain she has shortness of breath. Daughter reported that she was seen and assessed approximately 3-4 years ago and stated that an EKG was done and reported that the patient's heart is fine. Daughter reported that patient does not follow with a Cardiologist. Denied history or smoking or alcohol use. Denied diaphoresis, nausea, vomiting, abdominal pain, visual changes.  PCP Dr. Alain Marion  Past Medical History  Diagnosis Date  . Anxiety   . Depression   . GERD (gastroesophageal reflux disease)   . Hyperlipidemia   . Hypertension   . Osteoporosis   . CVA (cerebral vascular accident)   . Thyroid cyst   . Cholelithiasis   . MR (mitral regurgitation)   . Memory loss   . Gout 2009  . Vitamin D deficiency   . H. pylori infection 2011  . Vaso-vagal reaction   . Thrombocytopenia   . Diverticulosis    Past Surgical History  Procedure Laterality Date  . Cholecystectomy  2010  . Lasik    . Cesarean section      Family History  Problem Relation Age of Onset  . Hypertension Other   . Stomach cancer Brother   . Ulcers Son   . Colon cancer Neg Hx    History  Substance Use Topics  . Smoking status: Never Smoker   . Smokeless tobacco: Never Used  . Alcohol Use: No   OB History    No data available     Review of Systems  Constitutional: Negative for fever and chills.  Respiratory: Positive for shortness of breath. Negative for chest tightness.   Cardiovascular: Positive for chest pain.  Neurological: Negative for dizziness and weakness.      Allergies  Aricept; Coreg; and Morphine sulfate  Home Medications   Prior to Admission medications   Medication Sig Start Date End Date Taking? Authorizing Provider  ALPRAZolam (XANAX) 0.25 MG tablet TAKE ONE TABLET BY MOUTH THREE TIMES DAILY AS NEEDED FOR SLEEP OR ANXIETY 10/27/13   Aleksei Plotnikov V, MD  amLODipine (NORVASC) 5 MG tablet TAKE ONE TABLET BY MOUTH EVERY DAY    Aleksei Plotnikov V, MD  azelastine (OPTIVAR) 0.05 % ophthalmic solution Place 1 drop into both eyes 2 (two) times daily. 07/23/13   Aleksei Plotnikov V, MD  butalbital-acetaminophen-caffeine (FIORICET) 50-325-40 MG per tablet Take 1-2 tablets by mouth 2 (two) times daily as needed for headache. 11/24/12   Aleksei Plotnikov V, MD  carvedilol (COREG) 25 MG tablet Take 25 mg  by mouth daily as needed.    Historical Provider, MD  Cholecalciferol 1000 UNITS tablet Take 2,000 Units by mouth daily.      Historical Provider, MD  Cyanocobalamin (VITAMIN B-12) 1000 MCG SUBL Place 1 tablet under the tongue daily.      Historical Provider, MD  donepezil (ARICEPT) 10 MG tablet Take 1 tablet (10 mg total) by mouth at bedtime. 05/06/13   Aleksei Plotnikov V, MD  FLUoxetine (PROZAC) 20 MG tablet Take 1 tablet (20 mg total) by mouth daily. 02/04/13   Aleksei Plotnikov V, MD  gabapentin (NEURONTIN) 300 MG capsule Take 1 capsule (300 mg total) by mouth 3 (three) times daily. 05/06/13   Aleksei  Plotnikov V, MD  losartan (COZAAR) 100 MG tablet Take 1 tablet (100 mg total) by mouth daily. 02/04/13   Aleksei Plotnikov V, MD  NITROSTAT 0.4 MG SL tablet DISSOLVE ONE TABLET UNDER THE TONGUE EVERY 5 MINUTES AS NEEDED FOR CHEST PAIN.  DO NOT EXCEED A TOTAL OF 3 DOSES IN 15 MINUTES 10/04/13   Aleksei Plotnikov V, MD  ondansetron (ZOFRAN ODT) 4 MG disintegrating tablet Take 1 tablet (4 mg total) by mouth every 8 (eight) hours as needed for nausea or vomiting. 11/21/12   Shawnee Knapp, MD  traZODone (DESYREL) 50 MG tablet Take 50 mg by mouth at bedtime.    Historical Provider, MD   BP 143/75 mmHg  Pulse 52  Temp(Src) 97.6 F (36.4 C) (Oral)  Resp 12  Ht 5\' 5"  (1.651 m)  Wt 149 lb 14.6 oz (68 kg)  BMI 24.95 kg/m2  SpO2 96% Physical Exam  Constitutional: She is oriented to person, place, and time. She appears well-developed and well-nourished. No distress.  HENT:  Head: Normocephalic and atraumatic.  Mouth/Throat: Oropharynx is clear and moist. No oropharyngeal exudate.  Eyes: Conjunctivae and EOM are normal. Pupils are equal, round, and reactive to light. Right eye exhibits no discharge. Left eye exhibits no discharge.  Neck: Normal range of motion. Neck supple. No tracheal deviation present.  Cardiovascular: Normal rate, regular rhythm and normal heart sounds.   Cap refill < 3 seconds Negative swelling or pitting edema noted to the lower extremities bilaterally   Pulmonary/Chest: Effort normal and breath sounds normal. No respiratory distress. She has no wheezes. She has no rales. She exhibits no tenderness.  Patient is able to speak in full sentences without difficulty  Negative use of accessory muscles Negative stridor Negative pain upon palpation to the chest wall   Musculoskeletal: Normal range of motion.  Full ROM to upper and lower extremities without difficulty noted, negative ataxia noted.  Lymphadenopathy:    She has no cervical adenopathy.  Neurological: She is alert and oriented  to person, place, and time. No cranial nerve deficit. She exhibits normal muscle tone. Coordination normal.  Cranial nerves III-XII grossly intact Strength 5+/5+ to upper and lower extremities bilaterally with resistance applied, equal distribution noted Equal grip strength  Negative facial droop Negative slurred speech  Negative aphasia Negative arm drift Fine motor skills intact  Skin: Skin is warm and dry. No rash noted. She is not diaphoretic. No erythema.  Psychiatric: She has a normal mood and affect. Her behavior is normal. Thought content normal.  Nursing note and vitals reviewed.   ED Course  Procedures (including critical care time)  Results for orders placed or performed during the hospital encounter of 12/17/13  CBC with Differential  Result Value Ref Range   WBC 5.6 4.0 - 10.5 K/uL  RBC 4.26 3.87 - 5.11 MIL/uL   Hemoglobin 13.2 12.0 - 15.0 g/dL   HCT 37.1 36.0 - 46.0 %   MCV 87.1 78.0 - 100.0 fL   MCH 31.0 26.0 - 34.0 pg   MCHC 35.6 30.0 - 36.0 g/dL   RDW 12.4 11.5 - 15.5 %   Platelets 182 150 - 400 K/uL   Neutrophils Relative % 73 43 - 77 %   Neutro Abs 4.1 1.7 - 7.7 K/uL   Lymphocytes Relative 21 12 - 46 %   Lymphs Abs 1.2 0.7 - 4.0 K/uL   Monocytes Relative 6 3 - 12 %   Monocytes Absolute 0.3 0.1 - 1.0 K/uL   Eosinophils Relative 0 0 - 5 %   Eosinophils Absolute 0.0 0.0 - 0.7 K/uL   Basophils Relative 0 0 - 1 %   Basophils Absolute 0.0 0.0 - 0.1 K/uL  Protime-INR  Result Value Ref Range   Prothrombin Time 13.8 11.6 - 15.2 seconds   INR 1.05 0.00 - 1.49    Labs Review Labs Reviewed  CBC WITH DIFFERENTIAL  PROTIME-INR  COMPREHENSIVE METABOLIC PANEL  TROPONIN I  PRO B NATRIURETIC PEPTIDE    Imaging Review No results found.   EKG Interpretation None       Date: 12/17/2013  Rate: 59  Rhythm: normal sinus rhythm  QRS Axis: normal  Intervals: normal  ST/T Wave abnormalities: normal  Conduction Disutrbances:none  Narrative Interpretation:    Old EKG Reviewed: unchanged  EKG reviewed by this provider and attending physician.   12:56 PM This provider spoke with Trish, Cardiology - discussed case, labs, history in great detail. Cardiology to come and assess.   MDM   Final diagnoses:  None    Medications  sodium chloride 0.9 % bolus 500 mL (not administered)    Filed Vitals:   12/17/13 1130 12/17/13 1145 12/17/13 1200 12/17/13 1218  BP: 151/73 191/91 144/112 143/75  Pulse: 54 62 51 52  Temp:      TempSrc:      Resp: 12 23 15 12   Height:      Weight:      SpO2: 97% 100% 100% 96%   EKG noted normal sinus rhythm with a heart rate of 59 bpm. Tracings on the EMS noted intermittent, but multiple PVCs. Troponin negative elevation. PT/INR within normal limits. CBC unremarkable. CMP negative findings. Chest x-ray unremarkable. Negative findings of fluid overload, negative cardiomegaly. Chest pain has been ongoing for one month intermittently with negative troponin. Patient has high risk factors based on age, HTN, HLD. Cardiology to come and assess patient. Patient seen and assessed by cardiology-cardiology to admit patient. Discussed plan for admission with daughter and patient agreed to plan of care. Patient to be admitted for further cardiac workup to be performed. Patient stable for transfer.   Jamse Mead, PA-C 12/17/13 [place], MD 12/17/13 1459

## 2013-12-18 ENCOUNTER — Other Ambulatory Visit: Payer: Self-pay

## 2013-12-18 DIAGNOSIS — I2510 Atherosclerotic heart disease of native coronary artery without angina pectoris: Secondary | ICD-10-CM | POA: Diagnosis not present

## 2013-12-18 DIAGNOSIS — R0789 Other chest pain: Secondary | ICD-10-CM | POA: Diagnosis not present

## 2013-12-18 DIAGNOSIS — I10 Essential (primary) hypertension: Secondary | ICD-10-CM | POA: Diagnosis not present

## 2013-12-18 DIAGNOSIS — R079 Chest pain, unspecified: Secondary | ICD-10-CM | POA: Diagnosis not present

## 2013-12-18 DIAGNOSIS — F419 Anxiety disorder, unspecified: Secondary | ICD-10-CM | POA: Diagnosis not present

## 2013-12-18 LAB — BASIC METABOLIC PANEL
Anion gap: 12 (ref 5–15)
BUN: 16 mg/dL (ref 6–23)
CO2: 25 mEq/L (ref 19–32)
Calcium: 9.1 mg/dL (ref 8.4–10.5)
Chloride: 105 mEq/L (ref 96–112)
Creatinine, Ser: 1.02 mg/dL (ref 0.50–1.10)
GFR calc Af Amer: 60 mL/min — ABNORMAL LOW (ref >90)
GFR calc non Af Amer: 52 mL/min — ABNORMAL LOW (ref >90)
Glucose, Bld: 86 mg/dL (ref 70–99)
Potassium: 4.3 mEq/L (ref 3.7–5.3)
Sodium: 142 mEq/L (ref 137–147)

## 2013-12-18 LAB — TROPONIN I
Troponin I: <0.3 ng/mL (ref <0.30)
Troponin I: <0.3 ng/mL (ref <0.30)

## 2013-12-18 MED ORDER — KETOROLAC TROMETHAMINE 15 MG/ML IJ SOLN
15.0000 mg | Freq: Once | INTRAMUSCULAR | Status: AC
  Administered 2013-12-18: 15 mg via INTRAVENOUS

## 2013-12-18 NOTE — Progress Notes (Signed)
UR completed 

## 2013-12-18 NOTE — Plan of Care (Signed)
Problem: Phase I Progression Outcomes Goal: Hemodynamically stable Outcome: Completed/Met Date Met:  12/18/13 Goal: Anginal pain relieved Outcome: Completed/Met Date Met:  12/18/13 Goal: Aspirin unless contraindicated Outcome: Completed/Met Date Met:  12/18/13 Goal: MD aware of Cardiac Marker results Outcome: Completed/Met Date Met:  12/18/13 Goal: Voiding-avoid urinary catheter unless indicated Outcome: Completed/Met Date Met:  12/18/13

## 2013-12-18 NOTE — Progress Notes (Signed)
Subjective:  History obtained through daughter.  She still has some sharp left-sided chest pain that radiates around as well as tenderness on her scapula.  No midsternal tightness.  Feels better than she did yesterday.  Objective:  Vital Signs in the last 24 hours: BP 106/47 mmHg  Pulse 52  Temp(Src) 98.3 F (36.8 C) (Oral)  Resp 18  Ht 5\' 5"  (1.651 m)  Wt 69.31 kg (152 lb 12.8 oz)  BMI 25.43 kg/m2  SpO2 92%  Physical Exam: Pleasant elderly [place] female in no acute distress Lungs:  Clear  Cardiac:  Regular rhythm, normal S1 and S2, no S3 Extremities:  No edema present  Intake/Output from previous day: 12/04 0701 - 12/05 0700 In: 740 [P.O.:240; I.V.:500] Out: 400 [Urine:400] Weight Filed Weights   12/17/13 1113 12/18/13 0400  Weight: 68 kg (149 lb 14.6 oz) 69.31 kg (152 lb 12.8 oz)    Lab Results: Basic Metabolic Panel:  Recent Labs  12/17/13 1142 12/18/13 0125  NA 141 142  K 4.2 4.3  CL 104 105  CO2 22 25  GLUCOSE 110* 86  BUN 15 16  CREATININE 0.99 1.02    CBC:  Recent Labs  12/17/13 1142  WBC 5.6  NEUTROABS 4.1  HGB 13.2  HCT 37.1  MCV 87.1  PLT 182    BNP    Component Value Date/Time   PROBNP 92.5 12/17/2013 1141    PROTIME: Lab Results  Component Value Date   INR 1.05 12/17/2013   INR 1.03 07/01/2011   INR 1.09 10/29/2008    Telemetry: Currently in sinus rhythm  Assessment/Plan:  1.  Chest pain with atypical features with negative myocardial enzymes 2.  Hypertension. 3.  Hyperlipidemia, treated.  Recommendations:  Chest pain is atypical for angina.  Her enzymes are negative.  The nuclear medicine schedule is full today, so we'll proceed with testing tomorrow.      Kerry Hough  MD [facility]  12/18/2013, 10:42 AM

## 2013-12-19 ENCOUNTER — Observation Stay (HOSPITAL_COMMUNITY): Payer: Medicare Other

## 2013-12-19 DIAGNOSIS — R079 Chest pain, unspecified: Secondary | ICD-10-CM

## 2013-12-19 DIAGNOSIS — I2510 Atherosclerotic heart disease of native coronary artery without angina pectoris: Secondary | ICD-10-CM | POA: Diagnosis not present

## 2013-12-19 DIAGNOSIS — R0789 Other chest pain: Secondary | ICD-10-CM | POA: Diagnosis not present

## 2013-12-19 DIAGNOSIS — I10 Essential (primary) hypertension: Secondary | ICD-10-CM | POA: Diagnosis not present

## 2013-12-19 DIAGNOSIS — F419 Anxiety disorder, unspecified: Secondary | ICD-10-CM | POA: Diagnosis not present

## 2013-12-19 MED ORDER — TECHNETIUM TC 99M SESTAMIBI GENERIC - CARDIOLITE
10.0000 | Freq: Once | INTRAVENOUS | Status: AC | PRN
  Administered 2013-12-19: 10 via INTRAVENOUS

## 2013-12-19 MED ORDER — REGADENOSON 0.4 MG/5ML IV SOLN
0.4000 mg | Freq: Once | INTRAVENOUS | Status: AC
  Administered 2013-12-19: 0.4 mg via INTRAVENOUS
  Filled 2013-12-19: qty 5

## 2013-12-19 MED ORDER — ANGIOPLASTY BOOK
Freq: Once | Status: AC
  Administered 2013-12-19: 19:00:00
  Filled 2013-12-19: qty 1

## 2013-12-19 MED ORDER — SODIUM CHLORIDE 0.9 % IV SOLN
1.0000 mL/kg/h | INTRAVENOUS | Status: DC
  Administered 2013-12-19 – 2013-12-20 (×2): 1 mL/kg/h via INTRAVENOUS

## 2013-12-19 MED ORDER — TECHNETIUM TC 99M SESTAMIBI GENERIC - CARDIOLITE
30.0000 | Freq: Once | INTRAVENOUS | Status: AC | PRN
  Administered 2013-12-19: 30 via INTRAVENOUS

## 2013-12-19 MED ORDER — REGADENOSON 0.4 MG/5ML IV SOLN
INTRAVENOUS | Status: AC
  Administered 2013-12-19: 0.4 mg via INTRAVENOUS
  Filled 2013-12-19: qty 5

## 2013-12-19 MED ORDER — ACTIVE PARTNERSHIP FOR HEALTH OF YOUR HEART BOOK
Freq: Once | Status: AC
  Administered 2013-12-19: 1
  Filled 2013-12-19: qty 1

## 2013-12-19 NOTE — Progress Notes (Signed)
Subjective:  Daughter not currently in room and unable to get history in detail.  This does not complain of chest pain today.   Objective:  Vital Signs in the last 24 hours: BP 124/50 mmHg  Pulse 50  Temp(Src) 97.6 F (36.4 C) (Oral)  Resp 16  Ht 5\' 5"  (1.651 m)  Wt 71.033 kg (156 lb 9.6 oz)  BMI 26.06 kg/m2  SpO2 97%  Physical Exam: Pleasant elderly [place] female in no acute distress Lungs:  Clear  Cardiac:  Regular rhythm, normal S1 and S2, no S3 Extremities:  No edema present  Intake/Output from previous day: 12/05 0701 - 12/06 0700 In: 240 [P.O.:240] Out: 400 [Urine:400] Weight Filed Weights   12/17/13 1113 12/18/13 0400 12/19/13 0500  Weight: 68 kg (149 lb 14.6 oz) 69.31 kg (152 lb 12.8 oz) 71.033 kg (156 lb 9.6 oz)    Lab Results: Basic Metabolic Panel:  Recent Labs  12/17/13 1142 12/18/13 0125  NA 141 142  K 4.2 4.3  CL 104 105  CO2 22 25  GLUCOSE 110* 86  BUN 15 16  CREATININE 0.99 1.02    CBC:  Recent Labs  12/17/13 1142  WBC 5.6  NEUTROABS 4.1  HGB 13.2  HCT 37.1  MCV 87.1  PLT 182    BNP    Component Value Date/Time   PROBNP 92.5 12/17/2013 1141    PROTIME: Lab Results  Component Value Date   INR 1.05 12/17/2013   INR 1.03 07/01/2011   INR 1.09 10/29/2008    Telemetry: Currently in sinus rhythm  Assessment/Plan:  1.  Chest pain with atypical features with negative myocardial enzymes 2.  Hypertension. 3.  Hyperlipidemia, treated.  Recommendations:  Myoview is scheduled today.  Follow-up after Myoview.      Kerry Hough  MD [facility] Cardiology  12/19/2013, 8:52 AM

## 2013-12-19 NOTE — Plan of Care (Signed)
Problem: Phase II Progression Outcomes Goal: Hemodynamically stable Outcome: Completed/Met Date Met:  12/19/13

## 2013-12-19 NOTE — Plan of Care (Signed)
Problem: Phase II Progression Outcomes Goal: Anginal pain relieved Outcome: Completed/Met Date Met:  12/19/13     

## 2013-12-19 NOTE — Progress Notes (Signed)
Utilization Review Completed.   Talar Fraley, RN, BSN Nurse Case Manager  

## 2013-12-19 NOTE — Progress Notes (Signed)
Myoview abnormal. I explained to the pt's daughter that her mother will need a cath. Risks and benefits reviewed and they are in agreement. She will need a [place] interpreter  if her daughter can't get out of work Architectural technologist.   Kerin Ransom PA-C 12/19/2013 3:49 PM

## 2013-12-19 NOTE — Plan of Care (Signed)
Problem: Phase I Progression Outcomes Goal: Other Phase I Outcomes/Goals Outcome: Not Applicable Date Met:  58/52/77

## 2013-12-20 ENCOUNTER — Other Ambulatory Visit: Payer: Self-pay | Admitting: Cardiology

## 2013-12-20 ENCOUNTER — Encounter: Payer: Self-pay | Admitting: Emergency Medicine

## 2013-12-20 ENCOUNTER — Encounter (HOSPITAL_COMMUNITY): Payer: Self-pay | Admitting: Cardiology

## 2013-12-20 ENCOUNTER — Encounter (HOSPITAL_COMMUNITY)
Admission: EM | Disposition: A | Discharge status: Home / Self Care | Payer: Self-pay | Source: Home / Self Care | Attending: Cardiology

## 2013-12-20 DIAGNOSIS — E785 Hyperlipidemia, unspecified: Secondary | ICD-10-CM | POA: Diagnosis present

## 2013-12-20 DIAGNOSIS — R9439 Abnormal result of other cardiovascular function study: Secondary | ICD-10-CM | POA: Diagnosis not present

## 2013-12-20 DIAGNOSIS — F419 Anxiety disorder, unspecified: Secondary | ICD-10-CM | POA: Diagnosis present

## 2013-12-20 DIAGNOSIS — Z8673 Personal history of transient ischemic attack (TIA), and cerebral infarction without residual deficits: Secondary | ICD-10-CM | POA: Diagnosis not present

## 2013-12-20 DIAGNOSIS — K219 Gastro-esophageal reflux disease without esophagitis: Secondary | ICD-10-CM | POA: Diagnosis present

## 2013-12-20 DIAGNOSIS — R079 Chest pain, unspecified: Secondary | ICD-10-CM | POA: Diagnosis present

## 2013-12-20 DIAGNOSIS — I10 Essential (primary) hypertension: Secondary | ICD-10-CM | POA: Diagnosis present

## 2013-12-20 DIAGNOSIS — I2510 Atherosclerotic heart disease of native coronary artery without angina pectoris: Secondary | ICD-10-CM | POA: Diagnosis not present

## 2013-12-20 DIAGNOSIS — R072 Precordial pain: Secondary | ICD-10-CM

## 2013-12-20 DIAGNOSIS — M109 Gout, unspecified: Secondary | ICD-10-CM | POA: Diagnosis present

## 2013-12-20 DIAGNOSIS — Z79899 Other long term (current) drug therapy: Secondary | ICD-10-CM | POA: Diagnosis not present

## 2013-12-20 DIAGNOSIS — Z9889 Other specified postprocedural states: Secondary | ICD-10-CM

## 2013-12-20 DIAGNOSIS — F329 Major depressive disorder, single episode, unspecified: Secondary | ICD-10-CM | POA: Diagnosis present

## 2013-12-20 DIAGNOSIS — M810 Age-related osteoporosis without current pathological fracture: Secondary | ICD-10-CM | POA: Diagnosis present

## 2013-12-20 HISTORY — PX: LEFT HEART CATHETERIZATION WITH CORONARY ANGIOGRAM: SHX5451

## 2013-12-20 HISTORY — DX: Other specified postprocedural states: Z98.890

## 2013-12-20 SURGERY — LEFT HEART CATHETERIZATION WITH CORONARY ANGIOGRAM
Anesthesia: LOCAL

## 2013-12-20 MED ORDER — PANTOPRAZOLE SODIUM 40 MG PO TBEC
40.0000 mg | DELAYED_RELEASE_TABLET | Freq: Every day | ORAL | Status: DC
  Administered 2013-12-20: 40 mg via ORAL
  Filled 2013-12-20: qty 1

## 2013-12-20 MED ORDER — ACETAMINOPHEN 325 MG PO TABS: 650.0000 mg | ORAL_TABLET | ORAL | Status: DC | PRN

## 2013-12-20 MED ORDER — NITROGLYCERIN 1 MG/10 ML FOR IR/CATH LAB
INTRA_ARTERIAL | Status: AC
  Filled 2013-12-20: qty 10

## 2013-12-20 MED ORDER — HEPARIN SODIUM (PORCINE) 1000 UNIT/ML IJ SOLN
INTRAMUSCULAR | Status: AC
  Filled 2013-12-20: qty 1

## 2013-12-20 MED ORDER — MIDAZOLAM HCL 2 MG/2ML IJ SOLN
INTRAMUSCULAR | Status: AC
  Filled 2013-12-20: qty 2

## 2013-12-20 MED ORDER — HEPARIN (PORCINE) IN NACL 2-0.9 UNIT/ML-% IJ SOLN
INTRAMUSCULAR | Status: AC
  Filled 2013-12-20: qty 1500

## 2013-12-20 MED ORDER — SODIUM CHLORIDE 0.9 % IV SOLN
1.0000 mL/kg/h | INTRAVENOUS | Status: DC
  Administered 2013-12-20: 1 mL/kg/h via INTRAVENOUS

## 2013-12-20 MED ORDER — ASPIRIN 81 MG PO TBEC: 81.0000 mg | DELAYED_RELEASE_TABLET | Freq: Every day | ORAL | Status: DC

## 2013-12-20 MED ORDER — VERAPAMIL HCL 2.5 MG/ML IV SOLN
INTRAVENOUS | Status: AC
  Filled 2013-12-20: qty 2

## 2013-12-20 MED ORDER — PANTOPRAZOLE SODIUM 40 MG PO TBEC: 40.0000 mg | DELAYED_RELEASE_TABLET | Freq: Every day | ORAL | Status: DC

## 2013-12-20 MED ORDER — FENTANYL CITRATE 0.05 MG/ML IJ SOLN
INTRAMUSCULAR | Status: AC
  Filled 2013-12-20: qty 2

## 2013-12-20 MED ORDER — LIDOCAINE HCL (PF) 1 % IJ SOLN
INTRAMUSCULAR | Status: AC
  Filled 2013-12-20: qty 30

## 2013-12-20 MED ORDER — BUTALBITAL-APAP-CAFFEINE 50-325-40 MG PO TABS: 1.0000 | ORAL_TABLET | Freq: Two times a day (BID) | ORAL | Status: DC | PRN

## 2013-12-20 NOTE — Interval H&P Note (Signed)
History and Physical Interval Note:  12/20/2013 3:54 PM  Alice Wong  has presented today for surgery, with the diagnosis of unstable angina  The various methods of treatment have been discussed with the patient and family. After consideration of risks, benefits and other options for treatment, the patient has consented to  Procedure(s): LEFT HEART CATHETERIZATION WITH CORONARY ANGIOGRAM (N/A) as a surgical intervention .  The patient's history has been reviewed, patient examined, no change in status, stable for surgery.  I have reviewed the patient's chart and labs.  Questions were answered to the patient's satisfaction.   Cath Lab Visit (complete for each Cath Lab visit)  Clinical Evaluation Leading to the Procedure:   ACS: Yes.    Non-ACS:    Anginal Classification: CCS IV  Anti-ischemic medical therapy: Maximal Therapy (2 or more classes of medications)  Non-Invasive Test Results: Intermediate-risk stress test findings: cardiac mortality 1-3%/year  Prior CABG: No previous CABG        Collier [facility] 12/20/2013 3:54 PM

## 2013-12-20 NOTE — Progress Notes (Signed)
UR completed 

## 2013-12-20 NOTE — Discharge Instructions (Signed)
???? ? ??????? ??????? ?????? (???????????????) (Chest Pain (Nonspecific)) ????? ??? ????? ? ????? ????? ?????? ????????? ???????????? ???????. ?????? ??????? ??????????? ????, ??? ???? ???? ????? ???? ??????? ? ???-?? ?????????, ????????, ????????? ????????? ??? ?????????? ??????? ????? ? ???????? ??????????? ?????. ??? ?????????? ?????? ????????? ?? ?????? ??????????? ????? ??????? ??????. ???????   ??????.  ????????? ??? ???????.  ??????? ??? ??????.  ?????????? ???????? ?????? (??????????) ??? ?????? (??????? , ??? ?????????? ??????).  ????? ? ??????.  ???????? ??????? (????????????). ?? ????? ????????? ????????, ??????????????? (?????????? ????????????) ??? ? ?????????? ?????? ?????.  ???????????? ????? (?????????? ??????? ????????????? ???????). ??????? ?????? ??????? ?? ??????, ???? ? ??????. ????????? ?? ??????? ????? ?? ???? ?????? ????? ???? ???????? ????.  ????? ??????? ????? ?????????? ? ?????????? ??????.  ????? ??? ???????? ????? ????? ?????????? ?????????? ??? ????? ??? ? ?????????? ?????????????.  ????? ????? ???? ???????? ??????????????? ?????????? ? ?????? ?????? (???????? ???????). ???????  ????? ????? ??????? ????? ???? ????? ????????????? ???????????? ????? ??? ?????? ????????????. ??? ??????? ???? ????? ????? ????????? ????????????, ?????????? ???????????? ???????????????????? (???). ??? ???? ??? ?????????? ???????? ???????????? ? ??????? 24-???????? ???????. ??? ????? ????? ????????? ?????? ????????????, ????????:  ???????????????? ??????????????? (TT?). ?? ????? ??????????????? ??? ?????? ????, ??? ????? ????????? ????? ??????, ???????????? ???????? ?????.  ?????????????? ??????????????? (???)  ????????????????. ???? ??? ???????????? ????????? ????? ???????? ? ??????? ??????????? ??????? ?????? ? ???????????? ? ????????? ?????.  ???????? ??????????? ??? ?? ???????. ??? ????????? ??????????? ??????????, ??????? ????????? ????????? ???????????? ? ????? ?????? ?  ??????????? ???????. ?????? ?????????? ????????? ???????? ????? ??? ????????????? ??????????? ???????????? ? ??????? ?????????? ????.  ??? ??? ????????? ??? ??? ????? ???????? ?????????, ??????? ???????? ????????? ????. ???????   ??????? ??????? ?? ??????? ????? ???? ? ?????. ??????? ????? ???????? ?????????:  ????????? ????????? ??? ??????.  ????????????????????? ?????????.  ?????????????? ????????? ??? ?????????????? ????????????.  ??????????? ??? ??????? ????????.  ???????????? ? ????????? ????????? ?????? ?????. ??? ???????? ????? ?? ??????? ? ???????? ????????, ???????????? ???? ? ????????.  ??? ????? ???????????? ????? ????? ???????, ????? ??????? ????? ???????? ?????? ???? ???? ?????? (??????????? ). ??? ??????? ??????????? ????????? ??????? ?? ??????? ? ???????. ? ??????????? ??????? ??????????????? ???? ? ????? ???????? ? ??????? 2-3 ???? ????? ?????? ? ?????? ?????? ?????????????? ??????????.  ?????????? ?? ????? ? ???????? ????????   ???? ??? ????????? ???????????, ?????????? ?? ? ???????????? ? ????????. ?? ?????? ????????? ??????????? ???? ????????????, ???? ???? ?????????? ???? ?????.  ? ????????? ????????? ???? ????????? ?????????? ???????? ??? ????????????, ????? ??????? ???? ? ????? ??????????????. ????????????? ?????????? ???????? ? ???????????? ? ?????????? [?????].  ?? ??????? ??????????? ????? ???????? ??????? ? ??? ????? ????????, ??????????? ????? ??? ??????????? ????????.  ?? ???????????? ???????? ???????.  ?????????? ????????????? ????????? ?????? ? ???????????? ? ?????????? ?????.  ???? ???? ? ????? ?? ????????, ??????? ??????????? ?????? ????? ?? ??????????? ????????????.  ?? ??????????? ??????????? ?????? ? ?????. ???? ?? ?? ?????? ????????? ?? ?????? ? ??????, ???? ? ??? ????? ??????? ? ??????????????? (???????????) ??????. ???? ???????? ?????-???? ????????, ????????? ?????, ????? ????????? ???????. ?????????? ? ?????, ????:   ???? ???? ? ????? ??  ????????, ???? ????? ???????.  ?? ???? ? ??????? ????? ????????? ???? ? ????????.  ? ??? ?????????? ???????????. ?????????? ?????????? ? ?????, ????:   ? ??? ??????????? ???? ? ??????? ??????, ??? ????????? ????, ???????? ? ????, ???, ???????, ????? ??? ?????.  ? ??? ????????? ??????.  ? ??? ???????? ??????, ??? ?? ???????????? ?????.  ? ??? ????????? ??????? ???? ? ????? ??? ??????.  ??? ???????????? ??? ?????????? ?????.  ? ??? ?????? ??????? ????????.  ?? ??????? ????????.  ? ??? ??????? ?????. ??? ?????? ??????? ????????. ?? ??????? ?????, ??? ??? ?????? ?????. ?????????? ????????? ?????? ??????????? ??????. ?????????? (? ??? ???????:  911) ????????? ? ????????? ????????? ?????? ??????. ?? ??????? ?????????????? ???????? ??????? ?? ????????. ?????????, ??? ??:   ????????? ?????? ??????????.  ?????? ?????????????? ??????? ?? ????? ??????????.     ?????????????? (Coronary Angiogram) ??????????????? ?????????? ????? ?????????? ????????????, ???????????? ????? ?????????????????? ?????????, ??? ??????? ????????????? ??????? ? ??????. ??? ?????????? ???? ????????? ????????? (??????????? ????????) ????????????? ????? ??????? ????? ?????? (???????). ???? ??????? ????? ??????? ??????? ????????; ?? ??????????? ????? [????] ? ??????? ????, ???????? ??? ??????? ????? ????. ????????? ????????? ? ?????? ???????, ? ????????????? ???? ????? ????????, ??????? ?? ???-?? ?????????? ??????? ?????? ??????. [????? ??????????] ??????? ???????? ???????? ?????:  ? ????? ????????? ? ??? ????????, ? ??? ????? ???????? ?? ????????? ??? ??????????? ????????.  ? ???? ??????????? ???? ??????????, ??????? ????????, ?????????????, ??????? ?????, ????? ? ?????????????? ?????????.  ?? ????????? ??????? ??????? ?? ????????? ? ??? ??? ? ????? ??????? ?????????????.  ? ????? ???????????? ?????.  ? ????? ???????????? ???? ?????????.  ?? ????????? ????? ????????? ? ??????? ??? ? ????????  ???????????????.  ? ?????? ????????? ? ??? ????????????. ????? ? ??????????  ??? ???????, ?????????????? ???????? ?????????? ??????????. ??? ?? ?????, ????? ?????????? ????????, ??????? ?????????:  ????????????? ??????? ?? ??????????? ????????.  ???????????? ? ????? ???????? ???????? ??? ? ?????? ??????.  ????????? ?? ??????? ?????, ???????? ? ????? ? ?????????? ???????? ?????.  ??????? (?????).  ????????? ??????? (?????). ????? ??????????   ?? ??????? ?????? ???? ??? ???? ????? ???????? ? ???? ????? ??????????, ??? ?????????? ???????? ?????? ???????? ?????.  ??????????????????? ? ?????? ???????? ????? ? ????????????? ???????? ??? ???????? ????? ????? ??????? ????????. ??? ???????? ?????, ???? ?? ?????????? ???????????????????? ????????? ??? ????????? ??? ?????????? ?????. ?????????  ????? ?????????? ??? ????? ???? ?????????????? ???????? (?????????? ????????) ????? ?? ???????????? ? ???????????. ??? ????????? ?????? ??????????? (?/?) ????? ??????, ??????? ??????????? ? ???? ?? ???.  ??????? ????, ??? ????? ??????????????? ???????, ????????? ? ?????????. ??? ?????? ???????? ? ??????? ????, ?? ????? ???? ??????? ? ??????? ????? ???? (?????????) ??? ? ??????? ????????.  ??? ?????? ????????????? ????????, ??????? ???????????? ????? ????????? ???????? (??????? ?????????).  ???? ??????? ??????? ? ???????. ??????? ????? ????????? ??? ????????? ???????????? ???? ?????????????? ????????? (????????????) ???, ????? ????? ???? ??????????? ??????????? ??????.  ????? ? ??????? ?????? ??????????? ????????? ? ????????? ??????????????? ??????? ? ??????? ????????????? ?????. ???? ????????? ????????? ??????? ????? ??????? ??? ????????? ???????, ???????? ??????. ????? ?????????   ???? ?????? ????????? ?????????? ? ??????? ????, ??? ??????????? ????????? ? ??????? ? ??????? ?????????? ?????. ??? ???????? ?? ??????? ? ?? ?????????? ????.  ????? ????????? ???????? ????? ????? ?????????.  ? ??? ?????  ????? ????????? ????? ?? ????? ??? ?? ????????.  ????? ???? ????????? ?????????????? ??????? ?????, ?????????????? ? ???-????????????. Document Released: 05/19/2008 Document Revised: 05/17/2013 [facility] Patient Information 2015 [place]. This information is not intended to replace advice given to you by your health care provider. Make sure you discuss any questions you have with your health care provider.   ??????????????? ?????????? ? ?????, ???? ??? ?? ?????????? ????? ??? ?????????? ????. Document Released: 12/14/2007 Document Revised: 01/05/2013 [facility] Patient Information 2015 [place]. This information is not intended to replace advice given to you by your health care provider. Make sure you discuss any questions you have with your health care provider.  Call [facility] at [phone] if any bleeding, swelling or drainage at cath site.  May shower, no tub baths for 48 hours for groin sticks. No lifting over 5 pounds for 3 days.  No Driving for 3 days  if you drive.

## 2013-12-20 NOTE — Progress Notes (Signed)
Subjective: No chest pain. History is limited by difficulty communicating with this patient who does not speak [place]. The nurse just got off the phone with the translator and the patient only complains of some pain in her left mid back area with certain positions. Otherwise she has no complaints this morning per nursing.  Objective: Vital signs in last 24 hours: Temp:  [97.5 F (36.4 C)-98.2 F (36.8 C)] 97.5 F (36.4 C) (12/07 0500) Pulse Rate:  [54-57] 54 (12/07 0500) Resp:  [14-18] 14 (12/07 0500) BP: (112-178)/(51-77) 138/56 mmHg (12/07 0500) SpO2:  [96 %-100 %] 97 % (12/07 0500) Weight:  [155 lb 4.8 oz (70.444 kg)] 155 lb 4.8 oz (70.444 kg) (12/07 0500) Last BM Date: 12/17/13  Intake/Output from previous day: 12/06 0701 - 12/07 0700 In: 573 [P.O.:360; I.V.:213] Out: 2700 [Urine:2700] Intake/Output this shift:    Medications Current Facility-Administered Medications  Medication Dose Route Frequency Provider Last Rate Last Dose  . 0.9 %  sodium chloride infusion  1 mL/kg/hr Intravenous Continuous Erlene Quan, PA-C 71 mL/hr at 12/20/13 0309 1 mL/kg/hr at 12/20/13 0309  . acetaminophen (TYLENOL) tablet 650 mg  650 mg Oral Q4H PRN Almyra Deforest, PA   650 mg at 12/20/13 0424  . ALPRAZolam Duanne Moron) tablet 0.25 mg  0.25 mg Oral TID PRN Almyra Deforest, PA      . alum & mag hydroxide-simeth (MAALOX/MYLANTA) 200-200-20 MG/5ML suspension 30 mL  30 mL Oral Q4H PRN Carlena Bjornstad, MD   30 mL at 12/18/13 2219  . amLODipine (NORVASC) tablet 5 mg  5 mg Oral Daily Almyra Deforest, [place]   5 mg at 12/19/13 1344  . aspirin EC tablet 81 mg  81 mg Oral Daily Almyra Deforest, [place]   81 mg at 12/19/13 1342  . butalbital-acetaminophen-caffeine (FIORICET, ESGIC) 50-325-40 MG per tablet 1-2 tablet  1-2 tablet Oral BID PRN Almyra Deforest, PA   1 tablet at 12/19/13 1343  . cholecalciferol (VITAMIN D) tablet 2,000 Units  2,000 Units Oral Daily Almyra Deforest, PA   2,000 Units at 12/19/13 1346  . donepezil (ARICEPT) tablet 10 mg  10 mg Oral  QHS Almyra Deforest, [place]   10 mg at 12/19/13 2116  . FLUoxetine (PROZAC) tablet 20 mg  20 mg Oral Daily Almyra Deforest, [place]   20 mg at 12/19/13 1346  . gabapentin (NEURONTIN) capsule 300 mg  300 mg Oral TID Almyra Deforest, PA   300 mg at 12/19/13 2116  . heparin injection 5,000 Units  5,000 Units Subcutaneous 3 times per day Almyra Deforest, PA   5,000 Units at 12/19/13 2116  . losartan (COZAAR) tablet 100 mg  100 mg Oral Daily Almyra Deforest, PA   100 mg at 12/19/13 1342  . nitroGLYCERIN (NITROSTAT) SL tablet 0.4 mg  0.4 mg Sublingual Q5 Min x 3 PRN Almyra Deforest, PA      . ondansetron (ZOFRAN) injection 4 mg  4 mg Intravenous Q6H PRN Almyra Deforest, PA      . ondansetron (ZOFRAN-ODT) disintegrating tablet 4 mg  4 mg Oral Q8H PRN Almyra Deforest, PA      . traZODone (DESYREL) tablet 50 mg  50 mg Oral QHS Almyra Deforest, PA   50 mg at 12/19/13 2116  . vitamin B-12 (CYANOCOBALAMIN) tablet 1,000 mcg  1,000 mcg Oral Daily Almyra Deforest, PA   1,000 mcg at 12/19/13 1346    PE: Pt is alert and oriented, NAD HEENT: normal Neck: JVP - normal Lungs: CTA bilaterally CV:  RRR without murmur or gallop Abd: soft, NT, Positive BS, no hepatomegaly Back: Mild tenderness to palpation right mid back Ext: no C/C/E Skin: warm/dry no rash     Lab Results:   Recent Labs  12/17/13 1142  WBC 5.6  HGB 13.2  HCT 37.1  PLT 182   BMET  Recent Labs  12/17/13 1142 12/18/13 0125  NA 141 142  K 4.2 4.3  CL 104 105  CO2 22 25  GLUCOSE 110* 86  BUN 15 16  CREATININE 0.99 1.02  CALCIUM 9.4 9.1   PT/INR  Recent Labs  12/17/13 1142  LABPROT 13.8  INR 1.05   Assessment/Plan   Active Problems:   Depression with anxiety   Vasovagal syncope   Chest pressure   Chest pain   Plan:  Ruled out for MI.  SP lexiscan revealing a moderate-sized anterior wall stress-induced perfusion defect.  EF 69%.  Left heart cath today.On ASA, cozaar, amlodipine.  Intolerance to Coreg.  Labile BP.     LOS: 3 days    HAGER, BRYAN PA-C 12/20/2013 7:34 AM  Patient seen,  examined. Available data reviewed. Agree with findings, assessment, and plan as outlined by Tarri Fuller with changes made. Data reviewed. The patient was noted to have a moderate-sized reversible perfusion defect in the anterior wall on her nuclear scan. Patient for cardiac catheterization and possible PCI today. Discussed with nursing and the patient has been consented. Her daughter has been here previously and the procedure as well as its risks, potential benefits, and alternatives have been reviewed with the patient and family. Further disposition pending her cardiac catheterization results.  Sherren Mocha, M.D. 12/20/2013 9:37 AM

## 2013-12-20 NOTE — H&P (View-Only) (Signed)
Subjective: No chest pain. History is limited by difficulty communicating with this patient who does not speak [place]. The nurse just got off the phone with the translator and the patient only complains of some pain in her left mid back area with certain positions. Otherwise she has no complaints this morning per nursing.  Objective: Vital signs in last 24 hours: Temp:  [97.5 F (36.4 C)-98.2 F (36.8 C)] 97.5 F (36.4 C) (12/07 0500) Pulse Rate:  [54-57] 54 (12/07 0500) Resp:  [14-18] 14 (12/07 0500) BP: (112-178)/(51-77) 138/56 mmHg (12/07 0500) SpO2:  [96 %-100 %] 97 % (12/07 0500) Weight:  [155 lb 4.8 oz (70.444 kg)] 155 lb 4.8 oz (70.444 kg) (12/07 0500) Last BM Date: 12/17/13  Intake/Output from previous day: 12/06 0701 - 12/07 0700 In: 573 [P.O.:360; I.V.:213] Out: 2700 [Urine:2700] Intake/Output this shift:    Medications Current Facility-Administered Medications  Medication Dose Route Frequency Provider Last Rate Last Dose  . 0.9 %  sodium chloride infusion  1 mL/kg/hr Intravenous Continuous Erlene Quan, PA-C 71 mL/hr at 12/20/13 0309 1 mL/kg/hr at 12/20/13 0309  . acetaminophen (TYLENOL) tablet 650 mg  650 mg Oral Q4H PRN Almyra Deforest, PA   650 mg at 12/20/13 0424  . ALPRAZolam Duanne Moron) tablet 0.25 mg  0.25 mg Oral TID PRN Almyra Deforest, PA      . alum & mag hydroxide-simeth (MAALOX/MYLANTA) 200-200-20 MG/5ML suspension 30 mL  30 mL Oral Q4H PRN Carlena Bjornstad, MD   30 mL at 12/18/13 2219  . amLODipine (NORVASC) tablet 5 mg  5 mg Oral Daily Almyra Deforest, [place]   5 mg at 12/19/13 1344  . aspirin EC tablet 81 mg  81 mg Oral Daily Almyra Deforest, [place]   81 mg at 12/19/13 1342  . butalbital-acetaminophen-caffeine (FIORICET, ESGIC) 50-325-40 MG per tablet 1-2 tablet  1-2 tablet Oral BID PRN Almyra Deforest, PA   1 tablet at 12/19/13 1343  . cholecalciferol (VITAMIN D) tablet 2,000 Units  2,000 Units Oral Daily Almyra Deforest, PA   2,000 Units at 12/19/13 1346  . donepezil (ARICEPT) tablet 10 mg  10 mg Oral  QHS Almyra Deforest, [place]   10 mg at 12/19/13 2116  . FLUoxetine (PROZAC) tablet 20 mg  20 mg Oral Daily Almyra Deforest, [place]   20 mg at 12/19/13 1346  . gabapentin (NEURONTIN) capsule 300 mg  300 mg Oral TID Almyra Deforest, PA   300 mg at 12/19/13 2116  . heparin injection 5,000 Units  5,000 Units Subcutaneous 3 times per day Almyra Deforest, PA   5,000 Units at 12/19/13 2116  . losartan (COZAAR) tablet 100 mg  100 mg Oral Daily Almyra Deforest, PA   100 mg at 12/19/13 1342  . nitroGLYCERIN (NITROSTAT) SL tablet 0.4 mg  0.4 mg Sublingual Q5 Min x 3 PRN Almyra Deforest, PA      . ondansetron (ZOFRAN) injection 4 mg  4 mg Intravenous Q6H PRN Almyra Deforest, PA      . ondansetron (ZOFRAN-ODT) disintegrating tablet 4 mg  4 mg Oral Q8H PRN Almyra Deforest, PA      . traZODone (DESYREL) tablet 50 mg  50 mg Oral QHS Almyra Deforest, PA   50 mg at 12/19/13 2116  . vitamin B-12 (CYANOCOBALAMIN) tablet 1,000 mcg  1,000 mcg Oral Daily Almyra Deforest, PA   1,000 mcg at 12/19/13 1346    PE: Pt is alert and oriented, NAD HEENT: normal Neck: JVP - normal Lungs: CTA bilaterally CV:  RRR without murmur or gallop Abd: soft, NT, Positive BS, no hepatomegaly Back: Mild tenderness to palpation right mid back Ext: no C/C/E Skin: warm/dry no rash     Lab Results:   Recent Labs  12/17/13 1142  WBC 5.6  HGB 13.2  HCT 37.1  PLT 182   BMET  Recent Labs  12/17/13 1142 12/18/13 0125  NA 141 142  K 4.2 4.3  CL 104 105  CO2 22 25  GLUCOSE 110* 86  BUN 15 16  CREATININE 0.99 1.02  CALCIUM 9.4 9.1   PT/INR  Recent Labs  12/17/13 1142  LABPROT 13.8  INR 1.05   Assessment/Plan   Active Problems:   Depression with anxiety   Vasovagal syncope   Chest pressure   Chest pain   Plan:  Ruled out for MI.  SP lexiscan revealing a moderate-sized anterior wall stress-induced perfusion defect.  EF 69%.  Left heart cath today.On ASA, cozaar, amlodipine.  Intolerance to Coreg.  Labile BP.     LOS: 3 days    HAGER, BRYAN PA-C 12/20/2013 7:34 AM  Patient seen,  examined. Available data reviewed. Agree with findings, assessment, and plan as outlined by Tarri Fuller with changes made. Data reviewed. The patient was noted to have a moderate-sized reversible perfusion defect in the anterior wall on her nuclear scan. Patient for cardiac catheterization and possible PCI today. Discussed with nursing and the patient has been consented. Her daughter has been here previously and the procedure as well as its risks, potential benefits, and alternatives have been reviewed with the patient and family. Further disposition pending her cardiac catheterization results.  Sherren Mocha, M.D. 12/20/2013 9:37 AM

## 2013-12-20 NOTE — Progress Notes (Signed)
Patient and daughter educated on post cath and TR band care.  Right radial level 1 due to minor bruising, no pain per patient.  Patient and family given [place] translation education, verbalized understanding. Patient discharged home with daughter.

## 2013-12-20 NOTE — Discharge Summary (Signed)
Physician Discharge Summary       Patient ID: Alice Wong MRN: [id number] DOB/AGE: 1934-08-08 79 y.o.  Admit date: 12/17/2013 Discharge date: 12/20/2013 Primary Cardiologist: New Dr. Ron Parker   Discharge Diagnoses:  Principal Problem:   Chest pain, resolved possible GI  Active Problems:   Abnormal nuclear stress test, false positive per cath   S/P cardiac cath 12/20/13, non obsteructive disease   Depression with anxiety   Vasovagal syncope   Discharged Condition: good  Procedures: 12/20/13 cardiac cath by Dr. Martinique with non obstructive disease  Hospital Course:  79 year old [place] speaking female with past medical history of anxiety/depression, GERD, hypertension, hyperlipidemia, history of TIA and history of recurrent syncope present with intermittent CP for 2 month.  No prior cardiac issue other than questionable arrhythmia and MR. She denies any prior cardiac workup either. She has been having some recurrent syncopal episode for the past 20 years which was likely vaso-vagal in nature. She suffered a head trauma as a child. Family felt it is unrelated to the current chest discomfort. The last episode of syncope was half a month ago when she was at a supermarket. For the past 2 month, patient has been noticing intermittent chest discomfort. She described as left substernal burning/chest pressure sensation radiating to left shoulder and left leg. Patient cannot recall any exacerbating or alleviating factors. She states that episode tend to last from minutes to days at a time. It is associated with some degree of shortness breath. Daughter states that the patient has not been very active at home due to the imbalance issue and occasional syncope.  Pain became worse and she presented to the ER.  No significant EKG changes, and negative MI, negative troponins.   She underwent a lexiscan myoview and it was found to be positive, so we proceeded with cardiac cath.  The cath was with non  obstructive CAD.  We have added protonix for now and she will see PCP for for evaluation.  Plan was to keep after the cath but the pt's daughter is having surgery in [facility] tomorrow so as pt has no further pain, we will discharge if no problems with her cath site.  She will follow up with Dr. Ron Parker and her PCP.  I do not have a lipid panel- with her disease I have continued her asa but stopped the advil.        Consults: None  Significant Diagnostic Studies:  BMET    Component Value Date/Time   NA 142 12/18/2013 0125   K 4.3 12/18/2013 0125   CL 105 12/18/2013 0125   CO2 25 12/18/2013 0125   GLUCOSE 86 12/18/2013 0125   BUN 16 12/18/2013 0125   CREATININE 1.02 12/18/2013 0125   CALCIUM 9.1 12/18/2013 0125   GFRNONAA 52* 12/18/2013 0125   GFRAA 60* 12/18/2013 0125    CBC    Component Value Date/Time   WBC 5.6 12/17/2013 1142   WBC 6.6 01/07/2012 1209   RBC 4.26 12/17/2013 1142   RBC 4.06 01/07/2012 1209   HGB 13.2 12/17/2013 1142   HGB 13.3 01/07/2012 1209   HCT 37.1 12/17/2013 1142   HCT 37.1 01/07/2012 1209   PLT 182 12/17/2013 1142   PLT 164 01/07/2012 1209   MCV 87.1 12/17/2013 1142   MCV 91.2 01/07/2012 1209   MCH 31.0 12/17/2013 1142   MCH 32.6 01/07/2012 1209   MCHC 35.6 12/17/2013 1142   MCHC 35.8 01/07/2012 1209   RDW 12.4 12/17/2013 1142  RDW 13.4 01/07/2012 1209   LYMPHSABS 1.2 12/17/2013 1142   LYMPHSABS 1.7 01/07/2012 1209   MONOABS 0.3 12/17/2013 1142   MONOABS 0.4 01/07/2012 1209   EOSABS 0.0 12/17/2013 1142   EOSABS 0.1 01/07/2012 1209   BASOSABS 0.0 12/17/2013 1142   BASOSABS 0.0 01/07/2012 1209    Troponin I <0.30 X 3  Lexiscan myoview:  1. Moderate-sized anterior wall stress-induced perfusion defect.  2. Normal left ventricular wall motion.  3. Left ventricular ejection fraction 69%  4. Intermediate-risk stress test findings*.   Cardiac CAth:   Procedural Findings: Hemodynamics: AO 125/49 mean 79 mm Hg LV 120/13 mm  Hg  Coronary angiography: Coronary dominance: right  Left mainstem: Normal.   Left anterior descending (LAD): mild irregularities less than 10-20%. The first diagonal is a small vessel with 50-60% ostial stenosis.  Left circumflex (LCx): Normal.  Right coronary artery (RCA): There is a focal 30-40% proximal stenosis. Otherwise normal.  Left ventriculography: Left ventricular systolic function is normal, LVEF is estimated at 55-65%, there is no significant mitral regurgitation   Final Conclusions:  1. Nonobstructive CAD 2. Normal LV function.  Recommendations: medical management. Will need to consider alternative causes of her chest pain.  EKG: SR normal EKG   Discharge Exam: Blood pressure 143/109, pulse 55, temperature 98 F (36.7 C), temperature source Oral, resp. rate 16, height 5\' 5"  (1.651 m), weight 155 lb 4.8 oz (70.444 kg), SpO2 98 %.    Disposition: 01-Home or Self Care     Medication List    ASK your doctor about these medications        ALPRAZolam 0.25 MG tablet  Commonly known as:  XANAX  TAKE ONE TABLET BY MOUTH THREE TIMES DAILY AS NEEDED FOR SLEEP OR ANXIETY     amLODipine 5 MG tablet  Commonly known as:  NORVASC  TAKE ONE TABLET BY MOUTH EVERY DAY     Cholecalciferol 1000 UNITS tablet  Take 1,000 Units by mouth daily.     donepezil 10 MG tablet  Commonly known as:  ARICEPT  Take 1 tablet (10 mg total) by mouth at bedtime.     FLUoxetine 20 MG tablet  Commonly known as:  PROZAC  Take 1 tablet (20 mg total) by mouth daily.     gabapentin 300 MG capsule  Commonly known as:  NEURONTIN  Take 1 capsule (300 mg total) by mouth 3 (three) times daily.     ibuprofen 200 MG tablet  Commonly known as:  ADVIL,MOTRIN  Take 200-400 mg by mouth daily as needed for headache.     losartan 100 MG tablet  Commonly known as:  COZAAR  Take 1 tablet (100 mg total) by mouth daily.     NITROSTAT 0.4 MG SL tablet  Generic drug:  nitroGLYCERIN  DISSOLVE  ONE TABLET UNDER THE TONGUE EVERY 5 MINUTES AS NEEDED FOR CHEST PAIN.  DO NOT EXCEED A TOTAL OF 3 DOSES IN 15 MINUTES     traZODone 50 MG tablet  Commonly known as:  DESYREL  Take 50 mg by mouth at bedtime.     Vitamin B-12 1000 MCG Subl  Place 1 tablet under the tongue daily.       Follow-up Information    Follow up with Dola Argyle, MD.   Specialty:  Cardiology   Why:  office will call you   Contact information:   [address] [place] [address] [phone]        Discharge  Instructions: Call [facility] at [phone] if any bleeding, swelling or drainage at cath site.  May shower, no tub baths for 48 hours for groin sticks. No lifting over 5 pounds for 3 days.  No Driving for 3 days if you drive.   rest of discharge instructions were in [place].  Signed: Isaiah Serge Nurse Practitioner-Certified [facility]: [facility] 12/20/2013, 7:28 PM  Time spent on discharge :> 35 minutes.

## 2013-12-20 NOTE — CV Procedure (Signed)
    Cardiac Catheterization Procedure Note  Name: Alice Wong MRN: [id number] DOB: 05/15/1934  Procedure: Left Heart Cath, Selective Coronary Angiography, LV angiography  Indication: 79 yo Female with chest pain. Abnormal myoview with anterior ischemia.   Procedural Details: The right wrist was prepped, draped, and anesthetized with 1% lidocaine. Using the modified Seldinger technique, a 6 French slender sheath was introduced into the right radial artery. 3 mg of verapamil was administered through the sheath, weight-based unfractionated heparin was administered intravenously. Standard Judkins catheters were used for selective coronary angiography and left ventriculography. Catheter exchanges were performed over an exchange length guidewire. There were no immediate procedural complications. A TR band was used for radial hemostasis at the completion of the procedure.  The patient was transferred to the post catheterization recovery area for further monitoring.  Procedural Findings: Hemodynamics: AO 125/49 mean 79 mm Hg LV 120/13 mm Hg  Coronary angiography: Coronary dominance: right  Left mainstem: Normal.   Left anterior descending (LAD): mild irregularities less than 10-20%. The first diagonal is a small vessel with 50-60% ostial stenosis.  Left circumflex (LCx): Normal.  Right coronary artery (RCA): There is a focal 30-40% proximal stenosis. Otherwise normal.  Left ventriculography: Left ventricular systolic function is normal, LVEF is estimated at 55-65%, there is no significant mitral regurgitation   Final Conclusions:   1. Nonobstructive CAD 2. Normal LV function.  Recommendations: medical management. Will need to consider alternative causes of her chest pain.  Peter Martinique, [place]  12/20/2013, 4:24 PM

## 2013-12-23 ENCOUNTER — Encounter (HOSPITAL_COMMUNITY): Payer: Self-pay | Admitting: Cardiovascular Disease

## 2013-12-24 ENCOUNTER — Telehealth: Payer: Self-pay | Admitting: Cardiology

## 2013-12-24 ENCOUNTER — Encounter: Payer: Medicare Other | Admitting: Physician Assistant

## 2013-12-24 NOTE — Telephone Encounter (Signed)
New Message  TCM appt. Pt daughter called to make appt, she stated that the appt had to occur after 12/22 due to transportation issues. Pts appt- 12/24 @ 12:10

## 2013-12-24 NOTE — Telephone Encounter (Signed)
Patient contacted regarding discharge from  [facility]  on 12/21/10.  Patient understands to follow up with provider Richardson Dopp on 01/06/14 at 12:10 at [address] location. Patient understands discharge instructions? yes  Patient understands medications and regiment? yes Patient understands to bring all medications to this visit? yes  Talked with patient's daughter, because patient only speaks [place]. According to daughter, they both understand discharge and medication instructions and have no questions at this time. They are aware of the appointment date, time and location.

## 2014-01-06 ENCOUNTER — Encounter: Payer: Medicare Other | Admitting: Physician Assistant

## 2014-01-10 ENCOUNTER — Encounter: Payer: Self-pay | Admitting: Nurse Practitioner

## 2014-01-10 ENCOUNTER — Ambulatory Visit (INDEPENDENT_AMBULATORY_CARE_PROVIDER_SITE_OTHER): Payer: Medicare Other | Admitting: Nurse Practitioner

## 2014-01-10 VITALS — BP 130/60 | HR 56 | Ht 63.0 in | Wt 154.0 lb

## 2014-01-10 DIAGNOSIS — Z9889 Other specified postprocedural states: Secondary | ICD-10-CM | POA: Diagnosis not present

## 2014-01-10 MED ORDER — SUCRALFATE 1 G PO TABS: 1.0000 g | ORAL_TABLET | Freq: Three times a day (TID) | ORAL | Status: DC

## 2014-01-10 NOTE — Progress Notes (Signed)
Alice Wong Date of Birth: 09/08/1934 Medical Record #[id number]  History of Present Illness: Alice Wong is seen back today for a post cath visit. Seen for Dr. Ron Parker. She is a 79 year old [place] speaking female with past medical history of anxiety/depression, GERD, hypertension, hyperlipidemia, history of TIA and history of recurrent syncope.  She presented earlier this month to the hospital with intermittent CP for 2 month - ended up getting cathed due to abnormal Myoview (anterior ischemia) - nonobstructive CAD noted. Chest pain not felt to be cardiac.  Comes in today. Here with her daughter who also interprets. Still not feeling well. Has this burning sensation in her chest. Says her whole body feels like she is on fire. On PPI now but not working. Eating does not make worse or better. No problems with her cath site.   Current Outpatient Prescriptions  Medication Sig Dispense Refill  . acetaminophen (TYLENOL) 325 MG tablet Take 2 tablets (650 mg total) by mouth every 4 (four) hours as needed for headache or mild pain.    Marland Kitchen ALPRAZolam (XANAX) 0.25 MG tablet TAKE ONE TABLET BY MOUTH THREE TIMES DAILY AS NEEDED FOR SLEEP OR ANXIETY 60 tablet 2  . amLODipine (NORVASC) 5 MG tablet TAKE ONE TABLET BY MOUTH EVERY DAY 90 tablet 3  . aspirin EC 81 MG EC tablet Take 1 tablet (81 mg total) by mouth daily.    . Cholecalciferol 1000 UNITS tablet Take 1,000 Units by mouth daily.     . Cyanocobalamin (VITAMIN B-12) 1000 MCG SUBL Place 1 tablet under the tongue daily.      Marland Kitchen donepezil (ARICEPT) 10 MG tablet Take 1 tablet (10 mg total) by mouth at bedtime. 90 tablet 3  . FLUoxetine (PROZAC) 20 MG tablet Take 1 tablet (20 mg total) by mouth daily. 30 tablet 11  . losartan (COZAAR) 100 MG tablet Take 1 tablet (100 mg total) by mouth daily. 30 tablet 11  . NITROSTAT 0.4 MG SL tablet DISSOLVE ONE TABLET UNDER THE TONGUE EVERY 5 MINUTES AS NEEDED FOR CHEST PAIN.  DO NOT EXCEED A TOTAL OF 3 DOSES IN 15  MINUTES 25 tablet 0  . pantoprazole (PROTONIX) 40 MG tablet Take 1 tablet (40 mg total) by mouth daily. 30 tablet 1  . traZODone (DESYREL) 50 MG tablet Take 50 mg by mouth at bedtime.    . gabapentin (NEURONTIN) 300 MG capsule Take 1 capsule (300 mg total) by mouth 3 (three) times daily. (Patient not taking: Reported on 12/19/2013) 90 capsule 11   No current facility-administered medications for this visit.    Allergies  Allergen Reactions  . Aricept [Donepezil Hcl]     ?dry tongue  . Coreg [Carvedilol]     ?problems  . Morphine Sulfate Other (See Comments)    REACTION: decreased blood pressure    Past Medical History  Diagnosis Date  . Anxiety   . Depression   . GERD (gastroesophageal reflux disease)   . Hyperlipidemia   . Hypertension   . Osteoporosis   . CVA (cerebral vascular accident)   . Thyroid cyst   . Cholelithiasis   . MR (mitral regurgitation)   . Memory loss   . Gout 2009  . Vitamin D deficiency   . H. pylori infection 2011  . Vaso-vagal reaction   . Thrombocytopenia   . Diverticulosis   . S/P cardiac cath 12/20/13 12/20/2013    Past Surgical History  Procedure Laterality Date  . Cholecystectomy  2010  . Lasik    .  Cesarean section    . Left heart catheterization with coronary angiogram N/A 12/20/2013    Procedure: LEFT HEART CATHETERIZATION WITH CORONARY ANGIOGRAM;  Surgeon: Burnell Blanks, MD;  Location: [facility] C[facility] LAB;  Service: Cardiovascular;  Laterality: N/A;    History  Smoking status  . Never Smoker   Smokeless tobacco  . Never Used    History  Alcohol Use No    Family History  Problem Relation Age of Onset  . Hypertension Other   . Stomach cancer Brother   . Ulcers Son   . Colon cancer Neg Hx     Review of Systems: The review of systems is per the HPI.  All other systems were reviewed and are negative.  Physical Exam: BP 130/60 mmHg  Pulse 56  Ht 5\' 3"  (1.6 m)  Wt 154 lb (69.854 kg)  BMI 27.29 kg/m2  SpO2 98% Patient  is alert and in no acute distress. She seems like she does not feel good today. Skin is warm and dry. Color is normal.  HEENT is unremarkable. Normocephalic/atraumatic. PERRL. Sclera are nonicteric. Neck is supple. No masses. No JVD. Lungs are clear. Cardiac exam shows a regular rate and rhythm. Abdomen is soft. Extremities are without edema. Gait and ROM are intact. No gross neurologic deficits noted. Cath site in the right wrist looks fine.  Wt Readings from Last 3 Encounters:  01/10/14 154 lb (69.854 kg)  12/20/13 155 lb 4.8 oz (70.444 kg)  10/27/13 156 lb (70.761 kg)    LABORATORY DATA/PROCEDURES:  Lab Results  Component Value Date   WBC 5.6 12/17/2013   HGB 13.2 12/17/2013   HCT 37.1 12/17/2013   PLT 182 12/17/2013   GLUCOSE 86 12/18/2013   CHOL 166 08/23/2011   TRIG 115.0 08/23/2011   HDL 36.90* 08/23/2011   LDLCALC 106* 08/23/2011   ALT 10 12/17/2013   AST 14 12/17/2013   NA 142 12/18/2013   K 4.3 12/18/2013   CL 105 12/18/2013   CREATININE 1.02 12/18/2013   BUN 16 12/18/2013   CO2 25 12/18/2013   TSH 2.57 05/06/2013   INR 1.05 12/17/2013   HGBA1C 5.8 02/04/2013    Lab Results  Component Value Date   CKTOTAL 115 08/23/2011   CKMB 2.0 08/23/2011   TROPONINI <0.30 12/18/2013    BNP (last 3 results)  Recent Labs  12/17/13 1141  PROBNP 92.5   Coronary angiography: Coronary dominance: right  Left mainstem: Normal.   Left anterior descending (LAD): mild irregularities less than 10-20%. The first diagonal is a small vessel with 50-60% ostial stenosis.  Left circumflex (LCx): Normal.  Right coronary artery (RCA): There is a focal 30-40% proximal stenosis. Otherwise normal.  Left ventriculography: Left ventricular systolic function is normal, LVEF is estimated at 55-65%, there is no significant mitral regurgitation   Final Conclusions:  1. Nonobstructive CAD 2. Normal LV function.  Recommendations: medical management. Will need to consider alternative  causes of her chest pain.  Peter Martinique, [place]  12/20/2013, 4:24 PM    Assessment / Plan: 1. S/P cardiac cath - nonobstructive CAD with normal LV function  2. Vasovagal syncope in the past.  3. Chest pain - not felt to be cardiac. Continues with symptoms. Daughter trying to get her back to PCP. Will add Carafate 1 gram QID. Will see if we can help facilitate her appointment.   See back prn.   Patient is agreeable to this plan and will call if any problems develop in the interim.  Burtis Junes, RN, [place] [address] [place], [place]  [phone]

## 2014-01-10 NOTE — Patient Instructions (Addendum)
Your recent heart catheterization looked good - very little blockage. Your chest pain is not felt to be coming from your heart.   Stay on your current medicines but I have added Carafate 1 gm to take 4 times a day - with meals and then at bedtime.  Follow up with Dr. Alain Marion as planned -  I will send him a note and we will try to get your visit with him moved up.  We will see you back as needed.  Call the [place] office at [phone] if you have any questions, problems or concerns.

## 2014-01-17 ENCOUNTER — Other Ambulatory Visit: Payer: Self-pay

## 2014-01-17 NOTE — Telephone Encounter (Signed)
Request for refill of trazadone  [phone] [facility]

## 2014-01-19 ENCOUNTER — Encounter: Payer: Self-pay | Admitting: Internal Medicine

## 2014-01-19 ENCOUNTER — Ambulatory Visit (INDEPENDENT_AMBULATORY_CARE_PROVIDER_SITE_OTHER): Payer: Medicare Other | Admitting: Internal Medicine

## 2014-01-19 VITALS — BP 130/72 | HR 60 | Temp 97.7°F | Wt 152.0 lb

## 2014-01-19 DIAGNOSIS — Z9889 Other specified postprocedural states: Secondary | ICD-10-CM

## 2014-01-19 DIAGNOSIS — K219 Gastro-esophageal reflux disease without esophagitis: Secondary | ICD-10-CM | POA: Diagnosis not present

## 2014-01-19 DIAGNOSIS — R55 Syncope and collapse: Secondary | ICD-10-CM | POA: Diagnosis not present

## 2014-01-19 DIAGNOSIS — I10 Essential (primary) hypertension: Secondary | ICD-10-CM | POA: Diagnosis not present

## 2014-01-19 DIAGNOSIS — R0789 Other chest pain: Secondary | ICD-10-CM

## 2014-01-19 DIAGNOSIS — R072 Precordial pain: Secondary | ICD-10-CM

## 2014-01-19 MED ORDER — CILIDINIUM-CHLORDIAZEPOXIDE 2.5-5 MG PO CAPS: 1.0000 | ORAL_CAPSULE | Freq: Three times a day (TID) | ORAL | Status: DC

## 2014-01-19 NOTE — Patient Instructions (Signed)
Stop Alprazolam Start Librax 3 times a day

## 2014-01-19 NOTE — Progress Notes (Signed)
Pre visit review using our clinic review tool, if applicable. No additional management support is needed unless otherwise documented below in the visit note. 

## 2014-01-19 NOTE — Assessment & Plan Note (Signed)
Recurrent Discussed 

## 2014-01-19 NOTE — Assessment & Plan Note (Signed)
Continue with current prescription therapy as reflected on the Med list.  

## 2014-01-19 NOTE — Assessment & Plan Note (Signed)
Coronary angiography: Coronary dominance: right  Left mainstem: Normal.   Left anterior descending (LAD): mild irregularities less than 10-20%. The first diagonal is a small vessel with 50-60% ostial stenosis.  Left circumflex (LCx): Normal.  Right coronary artery (RCA): There is a focal 30-40% proximal stenosis. Otherwise normal.  Left ventriculography: Left ventricular systolic function is normal, LVEF is estimated at 55-65%, there is no significant mitral regurgitation   Final Conclusions:  1. Nonobstructive CAD 2. Normal LV function.  Recommendations: medical management. Will need to consider alternative causes of her chest pain.

## 2014-01-19 NOTE — Assessment & Plan Note (Signed)
Continue with current prescription therapy as reflected on the Med list - Protonix - not helping w/CP/burning
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
## 2014-01-20 MED ORDER — TRAZODONE HCL 50 MG PO TABS: 50.0000 mg | ORAL_TABLET | Freq: Every day | ORAL | Status: DC

## 2014-02-02 ENCOUNTER — Telehealth: Payer: Self-pay | Admitting: Internal Medicine

## 2014-02-02 ENCOUNTER — Encounter: Payer: Self-pay | Admitting: Internal Medicine

## 2014-02-02 ENCOUNTER — Ambulatory Visit: Payer: Medicare Other | Admitting: Internal Medicine

## 2014-02-02 ENCOUNTER — Ambulatory Visit (INDEPENDENT_AMBULATORY_CARE_PROVIDER_SITE_OTHER): Payer: Medicare Other | Admitting: Internal Medicine

## 2014-02-02 VITALS — BP 172/100 | HR 58 | Temp 97.8°F | Wt 149.0 lb

## 2014-02-02 DIAGNOSIS — R0789 Other chest pain: Secondary | ICD-10-CM

## 2014-02-02 DIAGNOSIS — R269 Unspecified abnormalities of gait and mobility: Secondary | ICD-10-CM

## 2014-02-02 DIAGNOSIS — L570 Actinic keratosis: Secondary | ICD-10-CM | POA: Diagnosis not present

## 2014-02-02 DIAGNOSIS — R079 Chest pain, unspecified: Secondary | ICD-10-CM

## 2014-02-02 DIAGNOSIS — Z9889 Other specified postprocedural states: Secondary | ICD-10-CM

## 2014-02-02 DIAGNOSIS — R0600 Dyspnea, unspecified: Secondary | ICD-10-CM

## 2014-02-02 DIAGNOSIS — R072 Precordial pain: Secondary | ICD-10-CM

## 2014-02-02 DIAGNOSIS — F459 Somatoform disorder, unspecified: Secondary | ICD-10-CM | POA: Diagnosis not present

## 2014-02-02 DIAGNOSIS — R209 Unspecified disturbances of skin sensation: Secondary | ICD-10-CM

## 2014-02-02 MED ORDER — TRAMADOL-ACETAMINOPHEN 37.5-325 MG PO TABS: 0.5000 | ORAL_TABLET | Freq: Four times a day (QID) | ORAL | Status: DC | PRN

## 2014-02-02 NOTE — Patient Instructions (Signed)
   Postprocedure instructions :     Keep the wounds clean. You can wash them with liquid soap and water. Pat dry with gauze or a Kleenex tissue  Before applying antibiotic ointment and a Band-Aid.   You need to report immediately  if  any signs of infection develop.    

## 2014-02-02 NOTE — Telephone Encounter (Signed)
Alice Wong was seen today, but the medical instructions address a wound. She was not seen for a wound, but her husband Alice Wong was seen for a wound yesterday. Please ammend. Thank you

## 2014-02-02 NOTE — Telephone Encounter (Signed)
It was correct: wound care instructions after cryosurgery on face Thx

## 2014-02-02 NOTE — Assessment & Plan Note (Addendum)
L body half x years - multiple pain complaints related to severe L side of her body pains Neurol ref Ultaracet prn EKG Gabapentin

## 2014-02-02 NOTE — Telephone Encounter (Signed)
Notified pt daughter [name]) with md response...Johny Chess

## 2014-02-02 NOTE — Assessment & Plan Note (Signed)
L body half x years - multiple pain complaints related to severe L side of her body pains Neurol ref Ultaracet prn EKG Gabapentin

## 2014-02-02 NOTE — Assessment & Plan Note (Signed)
Cryo  

## 2014-02-02 NOTE — Assessment & Plan Note (Signed)
Chronic sx's Start Gabapentin

## 2014-02-02 NOTE — Assessment & Plan Note (Signed)
L body half x years - multiple pain complaints related to severe L side of her body pains and syncope Neurol ref Ultaracet prn EKG Gabapentin
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
## 2014-02-13 ENCOUNTER — Other Ambulatory Visit: Payer: Self-pay | Admitting: Internal Medicine

## 2014-03-08 ENCOUNTER — Ambulatory Visit (INDEPENDENT_AMBULATORY_CARE_PROVIDER_SITE_OTHER): Payer: Medicare Other | Admitting: Neurology

## 2014-03-08 ENCOUNTER — Encounter: Payer: Self-pay | Admitting: Neurology

## 2014-03-08 VITALS — BP 130/80 | HR 67 | Ht 63.0 in | Wt 156.6 lb

## 2014-03-08 DIAGNOSIS — G609 Hereditary and idiopathic neuropathy, unspecified: Secondary | ICD-10-CM | POA: Diagnosis not present

## 2014-03-08 DIAGNOSIS — F411 Generalized anxiety disorder: Secondary | ICD-10-CM

## 2014-03-08 DIAGNOSIS — I951 Orthostatic hypotension: Secondary | ICD-10-CM

## 2014-03-08 DIAGNOSIS — R202 Paresthesia of skin: Secondary | ICD-10-CM

## 2014-03-08 DIAGNOSIS — R209 Unspecified disturbances of skin sensation: Secondary | ICD-10-CM | POA: Diagnosis not present

## 2014-03-08 LAB — VITAMIN B12: Vitamin B-12: 941 pg/mL — ABNORMAL HIGH (ref 211–911)

## 2014-03-08 LAB — FOLATE: Folate: >20 ng/mL

## 2014-03-08 NOTE — Progress Notes (Signed)
[facility] Clinic Note - Initial Visit   Date: 03/08/2014  [name] MRN: [id number] DOB: 1934/05/19   Dear Dr. Alain Marion:  Thank you for your kind referral of Alice Wong for consultation of left sided pain. Although her history is well known to you, please allow [place] to reiterate it for the purpose of our medical record. The patient was accompanied to the clinic by daughter and [place] interpretor who also provides collateral information.     History of Present Illness: Alice Wong is a 80 y.o. right-handed [place] female with hypertension, hyperlipidemia, pre-diabetes, depression/anxiety,  presenting for evaluation of left sided paresthesias and chest pain.    Starting around 2013, she developed episodes of mid-sternal chest pain, described as heaviness.  These spells occur once every few days and lasts a few hours, but it can be variable.  Symptoms can occur at rest or with activity, at one time she was at the grocery store.  She endorses associated shortness of breath and feels nervous at times.  She underwent cardiology evaluation including cardiac catherization which showed nonobstructive CAD, which would not explain her discomfort.  Additionally, she has burning pain that starts in her back, radiates into her arms, and legs.  She endorses neck, shoulder, and low back pain.  There is no involvement of the right side.  For many years, she had complains of numbness/tingling of the feet (left > right) up to the level of her mid-calf, which is worse at night.  Sometimes the feet feet cold.  Her balance has been off and she replies on people or furniture for support.  She does not use a cane as a personal preference.  Her daughter says that when she is not taking gabapentin, she complains of pain more, but does not take her medication as prescribed.   Out-side paper records, electronic medical record, and images have been reviewed where available  and summarized as:  Labs 12/18/2013;  Na 142, K 4.3, Chl 105, Cr 1.02, Ca 9.1, glucose 86 Labs 04/2013:  TSH 2.57 Labs 05/2012:  352 Lavs 02/04/2013:  HbA1c 5.8 XR lumbar spine 11/24/2012:  no acute findings CT 12/01/2012:  no acute findings   Past Medical History  Diagnosis Date  . Anxiety   . Depression   . GERD (gastroesophageal reflux disease)   . Hyperlipidemia   . Hypertension   . Osteoporosis   . CVA (cerebral vascular accident)   . Thyroid cyst   . Cholelithiasis   . MR (mitral regurgitation)   . Memory loss   . Gout 2009  . Vitamin D deficiency   . H. pylori infection 2011  . Vaso-vagal reaction   . Thrombocytopenia   . Diverticulosis   . S/P cardiac cath 12/20/13 12/20/2013    Past Surgical History  Procedure Laterality Date  . Cholecystectomy  2010  . Lasik    . Cesarean section    . Left heart catheterization with coronary angiogram N/A 12/20/2013    Procedure: LEFT HEART CATHETERIZATION WITH CORONARY ANGIOGRAM;  Surgeon: Burnell Blanks, MD;  Location: [facility] CATH LAB;  Service: Cardiovascular;  Laterality: N/A;     Medications:  Current Outpatient Prescriptions on File Prior to Visit  Medication Sig Dispense Refill  . acetaminophen (TYLENOL) 325 MG tablet Take 2 tablets (650 mg total) by mouth every 4 (four) hours as needed for headache or mild pain.    Marland Kitchen amLODipine (NORVASC) 5 MG tablet TAKE ONE TABLET BY MOUTH EVERY DAY  90 tablet 3  . aspirin EC 81 MG EC tablet Take 1 tablet (81 mg total) by mouth daily.    . Cholecalciferol 1000 UNITS tablet Take 1,000 Units by mouth daily.     . clidinium-chlordiazePOXIDE (LIBRAX) 5-2.5 MG per capsule Take 1 capsule by mouth 4 (four) times daily -  before meals and at bedtime. 90 capsule 3  . Cyanocobalamin (VITAMIN B-12) 1000 MCG SUBL Place 1 tablet under the tongue daily.      Marland Kitchen donepezil (ARICEPT) 10 MG tablet Take 1 tablet (10 mg total) by mouth at bedtime. 90 tablet 3  . FLUoxetine (PROZAC) 20 MG tablet TAKE ONE  TABLET BY MOUTH ONCE DAILY 30 tablet 5  . gabapentin (NEURONTIN) 300 MG capsule Take 1 capsule (300 mg total) by mouth 3 (three) times daily. 90 capsule 11  . losartan (COZAAR) 100 MG tablet TAKE ONE TABLET BY MOUTH ONCE DAILY 30 tablet 5  . NITROSTAT 0.4 MG SL tablet DISSOLVE ONE TABLET UNDER THE TONGUE EVERY 5 MINUTES AS NEEDED FOR CHEST PAIN.  DO NOT EXCEED A TOTAL OF 3 DOSES IN 15 MINUTES 25 tablet 0  . pantoprazole (PROTONIX) 40 MG tablet Take 1 tablet (40 mg total) by mouth daily. 30 tablet 1  . sucralfate (CARAFATE) 1 G tablet Take 1 tablet (1 g total) by mouth 4 (four) times daily -  with meals and at bedtime. 120 tablet 1  . traMADol-acetaminophen (ULTRACET) 37.5-325 MG per tablet Take 0.5-1 tablets by mouth every 6 (six) hours as needed for moderate pain or severe pain. 100 tablet 3  . traZODone (DESYREL) 50 MG tablet Take 1 tablet (50 mg total) by mouth at bedtime. 30 tablet 11   No current facility-administered medications on file prior to visit.    Allergies:  Allergies  Allergen Reactions  . Aricept [Donepezil Hcl]     ?dry tongue  . Coreg [Carvedilol]     ?problems  . Morphine Sulfate Other (See Comments)    REACTION: decreased blood pressure    Family History: Family History  Problem Relation Age of Onset  . Hypertension Other   . Stomach cancer Brother   . Ulcers Son   . Colon cancer Neg Hx     Social History: History   Social History  . Marital Status: Married    Spouse Name: N/A  . Number of Children: 3  . Years of Education: N/A   Occupational History  . RETIRED    Social History Main Topics  . Smoking status: Never Smoker   . Smokeless tobacco: Never Used  . Alcohol Use: No  . Drug Use: No  . Sexual Activity: No   Other Topics Concern  . Not on file   Social History Narrative   Family History Hypertension   No caffeine    Lives with husband in a 2 story home.  Retired.     Review of Systems:  CONSTITUTIONAL: No fevers, chills, night  sweats, or weight loss.   EYES: No visual changes or eye pain ENT: No hearing changes.  No history of nose bleeds.   RESPIRATORY: No cough, wheezing +shortness of breath.   CARDIOVASCULAR: +chest pain, and palpitations.   GI: Negative for abdominal discomfort, blood in stools or black stools.  No recent change in bowel habits.   GU:  No history of incontinence.   MUSCLOSKELETAL: +history of joint pain or swelling.  +myalgias.   SKIN: Negative for lesions, rash, and itching.   HEMATOLOGY/ONCOLOGY: Negative for  prolonged bleeding, bruising easily, and swollen nodes.  No history of cancer.   ENDOCRINE: Negative for cold or heat intolerance, polydipsia or goiter.   PSYCH:  +depression or anxiety symptoms.   NEURO: As Above.   Vital Signs:  BP 130/80 mmHg  Pulse 67  Ht 5\' 3"  (1.6 m)  Wt 156 lb 9 oz (71.016 kg)  BMI 27.74 kg/m2  SpO2 98%   General Medical Exam:   General:  Well appearing, comfortable.   Eyes/ENT: see cranial nerve examination.   Neck: No masses appreciated.  Full range of motion without tenderness.  No carotid bruits. Respiratory:  Clear to auscultation, good air entry bilaterally.   Cardiac:  Regular rate and rhythm, no murmur.   Extremities:  Prominent bilateral great toe bunion causing toe deformities Skin:  No rashes or lesions.  Neurological Exam: MENTAL STATUS including orientation to time, place, person, recent and remote memory, attention span and concentration, language, and fund of knowledge is normal.  Speech is not dysarthric, but with heavy [place] accent.  CRANIAL NERVES: II:  No visual field defects.  Unremarkable fundi.   III-IV-VI: Pupils equal round and reactive to light.  Normal conjugate, extra-ocular eye movements in all directions of gaze.  No nystagmus.  No ptosis.   V:  Normal facial sensation.   VII:  Normal facial symmetry and movements.  No pathologic facial reflexes.  VIII:  Normal hearing and vestibular function.   IX-X:  Normal palatal  movement.   XI:  Normal shoulder shrug and head rotation.   XII:  Normal tongue strength and range of motion, no deviation or fasciculation.  MOTOR:  No atrophy, fasciculations or abnormal movements.  No pronator drift.  Tone is normal.    Right Upper Extremity:    Left Upper Extremity:    Deltoid  5/5   Deltoid  5/5   Biceps  5/5   Biceps  5/5   Triceps  5/5   Triceps  5/5   Wrist extensors  5/5   Wrist extensors  5/5   Wrist flexors  5/5   Wrist flexors  5/5   Finger extensors  5/5   Finger extensors  5/5   Finger flexors  5/5   Finger flexors  5/5   Dorsal interossei  5/5   Dorsal interossei  5/5   Abductor pollicis  5/5   Abductor pollicis  5/5   Tone (Ashworth scale)  0  Tone (Ashworth scale)  0   Right Lower Extremity:    Left Lower Extremity:    Hip flexors  5/5   Hip flexors  5/5   Hip extensors  5/5   Hip extensors  5/5   Knee flexors  5/5   Knee flexors  5/5   Knee extensors  5/5   Knee extensors  5/5   Dorsiflexors  5-/5   Dorsiflexors  5-/5   Plantarflexors  5/5   Plantarflexors  5/5   Toe extensors  5-/5   Toe extensors  5-/5   Toe flexors  5-/5   Toe flexors  5-/5   Tone (Ashworth scale)  0  Tone (Ashworth scale)  0   MSRs:  Right  Left brachioradialis 2+  brachioradialis 2+  biceps 2+  biceps 2+  triceps 2+  triceps 2+  patellar 2+  patellar 2+  ankle jerk 0  ankle jerk 0  Hoffman no  Hoffman no  plantar response down  plantar response down   SENSORY:  Absent vibration distal to ankles bilaterally, temperature and pin pick is reduced from mid-calf down, and she has marked sway with Rhomberg testing.  There is no thoracic sensory level.  COORDINATION/GAIT: Normal finger-to- nose-finger.  Intact rapid alternating movements bilaterally.  Able to rise from a chair without using arms.  Gait wide based, unsteady, with ataxic quality.  She is able to perform stressed and tandem gait, but is very  unsteady   IMPRESSION: Mrs. Petsch is a 80 year-old female presenting for evaluation of episodic chest heaviness, shortness of breath, and left sided burning pan and paresthesias.  Her neurological exam shows shows a distal predominant large fiber peripheral neuropathy affecting the legs bilaterally, to the level of the mid-calf.  This would explain her distal paresthesias, orthostasis, and gait difficulty, but it would not explain left arm symptoms.  Patient is agreeable to having electrodiagnostic testing of the left arm and leg, which will help determine if there is an underlying cervical radiculopathy.   She has no radicular symptoms and there is no thoracic sensory level on exam, so it is unlikely that her chest pain is related to thoracic radiculopathy, especially when predominant symptoms is "heaviness".  Her chest pain is most likely related to panic attacks and I encouraged her to follow-up with psychiatry.  I had extensive discussion with the patient regarding the pathogenesis, etiology, management, and natural course of neuropathy. Neuropathy tends to be slowly progressive, especially if a treatable etiology is not identified.  I would like to test for treatable causes of neuropathy. I discussed that in the vast majority of cases, despite checking for reversible causes, we are unable to find the underlying etiology and management is symptomatic.     PLAN/RECOMMENDATIONS:  1.  Check vitamin B12, copper, folate 2.  Start using knee high compression stockings 30-54mmHg, encouraged to make positional changes slowly 3.  Start taking gabapentin 300mg  at bedtime x 1 week, then increase 600mg  (2 tablets).  Common side effects discussed. 4.  Chest pain seems to be more related to panic attacks 5.  EMG of the left arm and leg 6.  Strongly encouraged her to use a cane or walker for gait assistance 7.  Fall precautions discussed, she was somewhat upset when I recommended clearing her hallway items  she could trip over, such as rugs, but this has been echoed by her daughter also 8.  Return to clinic as needed   The duration of this appointment visit was 45 minutes of face-to-face time with the patient.  Greater than 50% of this time was spent in counseling, explanation of diagnosis, planning of further management, and coordination of care.   Thank you for allowing me to participate in patient's care.  If I can answer any additional questions, I would be pleased to do so.    Sincerely,    Champagne Paletta K. Posey Pronto, DO

## 2014-03-08 NOTE — Patient Instructions (Addendum)
1.  Start taking gabapentin 300mg  (1 tablet) at bedtime for one week, then increase to 2 tablets (600mg ) at bedtime.  Common side effects include increased sleepiness and lightheadedness, so we will only dose it at bedtime 2.  Start using knee high compression stockings 30-109mmHg 3.  EMG of left arm and leg 4.  Check blood work 5.  Recommend seeing psychiatry for panic attacks as this may be causing chest discomfort 6.  Return to clinic as needed

## 2014-03-10 LAB — COPPER, SERUM: Copper: 71 ug/dL (ref 70–175)

## 2014-03-14 ENCOUNTER — Encounter: Payer: Self-pay | Admitting: *Deleted

## 2014-03-22 ENCOUNTER — Ambulatory Visit (INDEPENDENT_AMBULATORY_CARE_PROVIDER_SITE_OTHER)
Admission: RE | Admit: 2014-03-22 | Discharge: 2014-03-22 | Disposition: A | Discharge status: Home / Self Care | Payer: Medicare Other | Source: Ambulatory Visit | Attending: Internal Medicine | Admitting: Internal Medicine

## 2014-03-22 ENCOUNTER — Ambulatory Visit (INDEPENDENT_AMBULATORY_CARE_PROVIDER_SITE_OTHER): Payer: Medicare Other | Admitting: Internal Medicine

## 2014-03-22 ENCOUNTER — Encounter: Payer: Self-pay | Admitting: Internal Medicine

## 2014-03-22 VITALS — BP 112/68 | HR 60 | Temp 97.8°F | Resp 16 | Ht 63.0 in | Wt 153.8 lb

## 2014-03-22 DIAGNOSIS — H26493 Other secondary cataract, bilateral: Secondary | ICD-10-CM | POA: Diagnosis not present

## 2014-03-22 DIAGNOSIS — I10 Essential (primary) hypertension: Secondary | ICD-10-CM | POA: Diagnosis not present

## 2014-03-22 DIAGNOSIS — H2000 Unspecified acute and subacute iridocyclitis: Secondary | ICD-10-CM | POA: Diagnosis not present

## 2014-03-22 DIAGNOSIS — R0989 Other specified symptoms and signs involving the circulatory and respiratory systems: Secondary | ICD-10-CM | POA: Diagnosis not present

## 2014-03-22 DIAGNOSIS — R918 Other nonspecific abnormal finding of lung field: Secondary | ICD-10-CM | POA: Diagnosis not present

## 2014-03-22 DIAGNOSIS — R05 Cough: Secondary | ICD-10-CM

## 2014-03-22 DIAGNOSIS — R059 Cough, unspecified: Secondary | ICD-10-CM

## 2014-03-22 DIAGNOSIS — H578 Other specified disorders of eye and adnexa: Secondary | ICD-10-CM

## 2014-03-22 DIAGNOSIS — Z961 Presence of intraocular lens: Secondary | ICD-10-CM | POA: Diagnosis not present

## 2014-03-22 DIAGNOSIS — H5789 Other specified disorders of eye and adnexa: Secondary | ICD-10-CM

## 2014-03-22 NOTE — Patient Instructions (Signed)
Cough, Adult  A cough is a reflex that helps clear your throat and airways. It can help heal the body or may be a reaction to an irritated airway. A cough may only last 2 or 3 weeks (acute) or may last more than 8 weeks (chronic).  CAUSES Acute cough:  Viral or bacterial infections. Chronic cough:  Infections.  Allergies.  Asthma.  Post-nasal drip.  Smoking.  Heartburn or acid reflux.  Some medicines.  Chronic lung problems (COPD).  Cancer. SYMPTOMS   Cough.  Fever.  Chest pain.  Increased breathing rate.  High-pitched whistling sound when breathing (wheezing).  Colored mucus that you cough up (sputum). TREATMENT   A bacterial cough may be treated with antibiotic medicine.  A viral cough must run its course and will not respond to antibiotics.  Your caregiver may recommend other treatments if you have a chronic cough. HOME CARE INSTRUCTIONS   Only take over-the-counter or prescription medicines for pain, discomfort, or fever as directed by your caregiver. Use cough suppressants only as directed by your caregiver.  Use a cold steam vaporizer or humidifier in your bedroom or home to help loosen secretions.  Sleep in a semi-upright position if your cough is worse at night.  Rest as needed.  Stop smoking if you smoke. SEEK IMMEDIATE MEDICAL CARE IF:   You have pus in your sputum.  Your cough starts to worsen.  You cannot control your cough with suppressants and are losing sleep.  You begin coughing up blood.  You have difficulty breathing.  You develop pain which is getting worse or is uncontrolled with medicine.  You have a fever. MAKE SURE YOU:   Understand these instructions.  Will watch your condition.  Will get help right away if you are not doing well or get worse. Document Released: 06/29/2010 Document Revised: 03/25/2011 Document Reviewed: 06/29/2010 Exit[employer] Patient Information 2015 [employer]. This information is not intended  to replace advice given to you by your health care provider. Make sure you discuss any questions you have with your health care provider.  

## 2014-03-22 NOTE — Progress Notes (Signed)
Pre visit review using our clinic review tool, if applicable. No additional management support is needed unless otherwise documented below in the visit note. 

## 2014-03-23 ENCOUNTER — Encounter: Payer: Self-pay | Admitting: Internal Medicine

## 2014-03-23 NOTE — Assessment & Plan Note (Signed)
Her exam and CXR are WNL I think this is related to her post-nasal drip and GERD She will cont the current regimen to treat this

## 2014-03-23 NOTE — Progress Notes (Signed)
   Subjective:    Patient ID: Alice Wong, female    DOB: 06/09/1934, 80 y.o.   MRN: [id number]  HPI  New to me, she complains of a 4 day history of right eye pain, redness, decreased VA, and intermittent headache. She also has a chronic cough with nasal symptoms.  Review of Systems  Constitutional: Negative.  Negative for fever, chills, diaphoresis, activity change, appetite change and fatigue.  HENT: Positive for congestion, postnasal drip and rhinorrhea. Negative for nosebleeds, sinus pressure, sneezing, sore throat and trouble swallowing.   Eyes: Positive for photophobia, pain, redness and visual disturbance. Negative for discharge and itching.  Respiratory: Positive for cough. Negative for apnea, choking, chest tightness, shortness of breath, wheezing and stridor.   Cardiovascular: Negative.  Negative for chest pain, palpitations and leg swelling.  Gastrointestinal: Negative.  Negative for nausea, vomiting, abdominal pain, diarrhea, constipation and blood in stool.  Endocrine: Negative.   Genitourinary: Negative.   Musculoskeletal: Negative.   Skin: Negative.  Negative for pallor and rash.  Allergic/Immunologic: Negative.   Neurological: Negative.   Hematological: Negative.  Negative for adenopathy. Does not bruise/bleed easily.  Psychiatric/Behavioral: Negative.        Objective:   Physical Exam  Constitutional: She is oriented to person, place, and time. She appears well-developed and well-nourished. No distress.  HENT:  Head: Normocephalic and atraumatic.  Mouth/Throat: Oropharynx is clear and moist. No oropharyngeal exudate.  Eyes: EOM are normal. Pupils are equal, round, and reactive to light. Right eye exhibits chemosis. Right eye exhibits no discharge. Left eye exhibits no chemosis and no discharge. Right conjunctiva is injected. Right conjunctiva has no hemorrhage. Left conjunctiva is not injected. Left conjunctiva has no hemorrhage. No scleral icterus. Right eye  exhibits normal extraocular motion and no nystagmus. Left eye exhibits normal extraocular motion and no nystagmus.  Neck: Normal range of motion. Neck supple. No JVD present. No tracheal deviation present. No thyromegaly present.  Cardiovascular: Normal rate, regular rhythm, normal heart sounds and intact distal pulses.  Exam reveals no gallop and no friction rub.   No murmur heard. Pulmonary/Chest: Effort normal and breath sounds normal. No stridor. No respiratory distress. She has no wheezes. She has no rales. She exhibits no tenderness.  Abdominal: Soft. Bowel sounds are normal. She exhibits no distension and no mass. There is no tenderness. There is no rebound and no guarding.  Musculoskeletal: She exhibits no edema or tenderness.  Lymphadenopathy:    She has no cervical adenopathy.  Neurological: She is oriented to person, place, and time.  Skin: Skin is warm and dry. No rash noted. She is not diaphoretic. No erythema. No pallor.  Vitals reviewed.         Assessment & Plan:

## 2014-03-23 NOTE — Assessment & Plan Note (Signed)
This could be a simple case of conjunctivitis but she has a lot of pain as well so I am concerned about an ocular emergency such as acute glaucoma - I have arranged for her to see ophthalmology today at 1:30 PM, her daughter has been made aware of this and they were given written instructions with time and directions

## 2014-03-23 NOTE — Assessment & Plan Note (Signed)
Her BP is normal today

## 2014-03-29 ENCOUNTER — Emergency Department (HOSPITAL_COMMUNITY): Payer: Medicare Other

## 2014-03-29 ENCOUNTER — Encounter (HOSPITAL_COMMUNITY): Payer: Self-pay | Admitting: *Deleted

## 2014-03-29 ENCOUNTER — Emergency Department (HOSPITAL_COMMUNITY)
Admission: EM | Admit: 2014-03-29 | Discharge: 2014-03-29 | Disposition: A | Discharge status: Home / Self Care | Payer: Medicare Other | Attending: Emergency Medicine | Admitting: Emergency Medicine

## 2014-03-29 DIAGNOSIS — Z8619 Personal history of other infectious and parasitic diseases: Secondary | ICD-10-CM | POA: Insufficient documentation

## 2014-03-29 DIAGNOSIS — K219 Gastro-esophageal reflux disease without esophagitis: Secondary | ICD-10-CM | POA: Diagnosis not present

## 2014-03-29 DIAGNOSIS — Z7982 Long term (current) use of aspirin: Secondary | ICD-10-CM | POA: Insufficient documentation

## 2014-03-29 DIAGNOSIS — R0789 Other chest pain: Secondary | ICD-10-CM | POA: Diagnosis not present

## 2014-03-29 DIAGNOSIS — R1110 Vomiting, unspecified: Secondary | ICD-10-CM | POA: Diagnosis present

## 2014-03-29 DIAGNOSIS — F329 Major depressive disorder, single episode, unspecified: Secondary | ICD-10-CM | POA: Diagnosis not present

## 2014-03-29 DIAGNOSIS — Z79899 Other long term (current) drug therapy: Secondary | ICD-10-CM | POA: Insufficient documentation

## 2014-03-29 DIAGNOSIS — E559 Vitamin D deficiency, unspecified: Secondary | ICD-10-CM | POA: Insufficient documentation

## 2014-03-29 DIAGNOSIS — R0602 Shortness of breath: Secondary | ICD-10-CM | POA: Diagnosis not present

## 2014-03-29 DIAGNOSIS — I10 Essential (primary) hypertension: Secondary | ICD-10-CM | POA: Insufficient documentation

## 2014-03-29 DIAGNOSIS — R55 Syncope and collapse: Secondary | ICD-10-CM | POA: Insufficient documentation

## 2014-03-29 DIAGNOSIS — Z8673 Personal history of transient ischemic attack (TIA), and cerebral infarction without residual deficits: Secondary | ICD-10-CM | POA: Diagnosis not present

## 2014-03-29 DIAGNOSIS — E785 Hyperlipidemia, unspecified: Secondary | ICD-10-CM | POA: Insufficient documentation

## 2014-03-29 DIAGNOSIS — R072 Precordial pain: Secondary | ICD-10-CM | POA: Diagnosis not present

## 2014-03-29 DIAGNOSIS — M810 Age-related osteoporosis without current pathological fracture: Secondary | ICD-10-CM | POA: Diagnosis not present

## 2014-03-29 DIAGNOSIS — F419 Anxiety disorder, unspecified: Secondary | ICD-10-CM | POA: Insufficient documentation

## 2014-03-29 DIAGNOSIS — R079 Chest pain, unspecified: Secondary | ICD-10-CM | POA: Diagnosis not present

## 2014-03-29 DIAGNOSIS — H2000 Unspecified acute and subacute iridocyclitis: Secondary | ICD-10-CM | POA: Diagnosis not present

## 2014-03-29 DIAGNOSIS — R61 Generalized hyperhidrosis: Secondary | ICD-10-CM | POA: Insufficient documentation

## 2014-03-29 DIAGNOSIS — R112 Nausea with vomiting, unspecified: Secondary | ICD-10-CM | POA: Insufficient documentation

## 2014-03-29 DIAGNOSIS — Z862 Personal history of diseases of the blood and blood-forming organs and certain disorders involving the immune mechanism: Secondary | ICD-10-CM | POA: Diagnosis not present

## 2014-03-29 DIAGNOSIS — J449 Chronic obstructive pulmonary disease, unspecified: Secondary | ICD-10-CM | POA: Diagnosis not present

## 2014-03-29 LAB — CBC WITH DIFFERENTIAL/PLATELET
Basophils Absolute: 0 10*3/uL (ref 0.0–0.1)
Basophils Relative: 0 % (ref 0–1)
Eosinophils Absolute: 0.1 10*3/uL (ref 0.0–0.7)
Eosinophils Relative: 1 % (ref 0–5)
HCT: 34.9 % — ABNORMAL LOW (ref 36.0–46.0)
Hemoglobin: 12.5 g/dL (ref 12.0–15.0)
Lymphocytes Relative: 14 % (ref 12–46)
Lymphs Abs: 1.2 10*3/uL (ref 0.7–4.0)
MCH: 31.9 pg (ref 26.0–34.0)
MCHC: 35.8 g/dL (ref 30.0–36.0)
MCV: 89 fL (ref 78.0–100.0)
Monocytes Absolute: 0.5 10*3/uL (ref 0.1–1.0)
Monocytes Relative: 5 % (ref 3–12)
Neutro Abs: 6.8 10*3/uL (ref 1.7–7.7)
Neutrophils Relative %: 80 % — ABNORMAL HIGH (ref 43–77)
Platelets: 150 10*3/uL (ref 150–400)
RBC: 3.92 MIL/uL (ref 3.87–5.11)
RDW: 11.9 % (ref 11.5–15.5)
WBC: 8.6 10*3/uL (ref 4.0–10.5)

## 2014-03-29 LAB — LIPASE, BLOOD: Lipase: 25 U/L (ref 11–59)

## 2014-03-29 LAB — COMPREHENSIVE METABOLIC PANEL
ALT: 10 U/L (ref 0–35)
AST: 15 U/L (ref 0–37)
Albumin: 3.7 g/dL (ref 3.5–5.2)
Alkaline Phosphatase: 49 U/L (ref 39–117)
Anion gap: 12 (ref 5–15)
BUN: 14 mg/dL (ref 6–23)
CO2: 21 mmol/L (ref 19–32)
Calcium: 9 mg/dL (ref 8.4–10.5)
Chloride: 106 mmol/L (ref 96–112)
Creatinine, Ser: 0.91 mg/dL (ref 0.50–1.10)
GFR calc Af Amer: 69 mL/min — ABNORMAL LOW (ref >90)
GFR calc non Af Amer: 59 mL/min — ABNORMAL LOW (ref >90)
Glucose, Bld: 154 mg/dL — ABNORMAL HIGH (ref 70–99)
Potassium: 4.1 mmol/L (ref 3.5–5.1)
Sodium: 139 mmol/L (ref 135–145)
Total Bilirubin: 0.6 mg/dL (ref 0.3–1.2)
Total Protein: 5.8 g/dL — ABNORMAL LOW (ref 6.0–8.3)

## 2014-03-29 LAB — TROPONIN I: Troponin I: <0.03 ng/mL (ref <0.031)

## 2014-03-29 LAB — I-STAT TROPONIN, ED: Troponin i, poc: 0 ng/mL (ref 0.00–0.08)

## 2014-03-29 MED ORDER — ACETAMINOPHEN 500 MG PO TABS
1000.0000 mg | ORAL_TABLET | Freq: Once | ORAL | Status: AC
  Administered 2014-03-29: 1000 mg via ORAL
  Filled 2014-03-29: qty 2

## 2014-03-29 MED ORDER — GI COCKTAIL ~~LOC~~
30.0000 mL | Freq: Once | ORAL | Status: AC
Start: 2014-03-29 — End: 2014-03-29
  Administered 2014-03-29: 30 mL via ORAL
  Filled 2014-03-29: qty 30

## 2014-03-29 NOTE — ED Notes (Signed)
The pt arrived by gems from home she speaks only [place] she was vomiting on arrival here cool clammy.  She keeps batting at the light [id number] rfeason.  Her daughter is on the way here

## 2014-03-29 NOTE — ED Notes (Signed)
The pt is keeping both her eyes closed she has hyperactive bowel sounds.  She is rubbing her mid- upper abd.  She keeps swatting at the over head light,

## 2014-03-29 NOTE — ED Notes (Signed)
Spoke with daughter. She is coming to get her and will be about 30 minutes.

## 2014-03-29 NOTE — ED Notes (Signed)
amb pt to the bathroom.

## 2014-03-29 NOTE — ED Notes (Signed)
Unable to do   Effective orthostatic vital signs pt does not understand instructions and she does nit want to sit up at all

## 2014-03-29 NOTE — Discharge Instructions (Signed)
???? ? ??????? ??????? ?????? (???????????????) (Chest Pain (Nonspecific)) ????? ??? ????? ? ????? ????? ?????? ????????? ???????????? ???????. ?????? ??????? ??????????? ????, ??? ???? ???? ????? ???? ??????? ? ???-?? ?????????, ????????, ????????? ????????? ??? ?????????? ??????? ????? ? ???????? ??????????? ?????. ??? ?????????? ?????? ????????? ?? ?????? ??????????? ????? ??????? ??????. ???????   ??????.  ????????? ??? ???????.  ??????? ??? ??????.  ?????????? ???????? ?????? (??????????) ??? ?????? (??????? , ??? ?????????? ??????).  ????? ? ??????.  ???????? ??????? (????????????). ?? ????? ????????? ????????, ??????????????? (?????????? ????????????) ??? ? ?????????? ?????? ?????.  ???????????? ????? (?????????? ??????? ????????????? ???????). ??????? ?????? ??????? ?? ??????, ???? ? ??????. ????????? ?? ??????? ????? ?? ???? ?????? ????? ???? ???????? ????.  ????? ??????? ????? ?????????? ? ?????????? ??????.  ????? ??? ???????? ????? ????? ?????????? ?????????? ??? ????? ??? ? ?????????? ?????????????.  ????? ????? ???? ???????? ??????????????? ?????????? ? ?????? ?????? (???????? ???????). ???????  ????? ????? ??????? ????? ???? ????? ????????????? ???????????? ????? ??? ?????? ????????????. ??? ??????? ???? ????? ????? ????????? ????????????, ?????????? ???????????? ???????????????????? (???). ??? ???? ??? ?????????? ???????? ???????????? ? ??????? 24-???????? ???????. ??? ????? ????? ????????? ?????? ????????????, ????????:  ???????????????? ??????????????? (TT?). ?? ????? ??????????????? ??? ?????? ????, ??? ????? ????????? ????? ??????, ???????????? ???????? ?????.  ?????????????? ??????????????? (???)  ????????????????. ???? ??? ???????????? ????????? ????? ???????? ? ??????? ??????????? ??????? ?????? ? ???????????? ? ????????? ?????.  ???????? ??????????? ??? ?? ???????. ??? ????????? ??????????? ??????????, ??????? ????????? ????????? ???????????? ? ????? ?????? ?  ??????????? ???????. ?????? ?????????? ????????? ???????? ????? ??? ????????????? ??????????? ???????????? ? ??????? ?????????? ????.  ??? ??? ????????? ??? ??? ????? ???????? ?????????, ??????? ???????? ????????? ????. ???????   ??????? ??????? ?? ??????? ????? ???? ? ?????. ??????? ????? ???????? ?????????:  ????????? ????????? ??? ??????.  ????????????????????? ?????????.  ?????????????? ????????? ??? ?????????????? ????????????.  ??????????? ??? ??????? ????????.  ???????????? ? ????????? ????????? ?????? ?????. ??? ???????? ????? ?? ??????? ? ???????? ????????, ???????????? ???? ? ????????.  ??? ????? ???????????? ????? ????? ???????, ????? ??????? ????? ???????? ?????? ???? ???? ?????? (??????????? ). ??? ??????? ??????????? ????????? ??????? ?? ??????? ? ???????. ? ??????????? ??????? ??????????????? ???? ? ????? ???????? ? ??????? 2-3 ???? ????? ?????? ? ?????? ?????? ?????????????? ??????????.  ?????????? ?? ????? ? ???????? ????????   ???? ??? ????????? ???????????, ?????????? ?? ? ???????????? ? ????????. ?? ?????? ????????? ??????????? ???? ????????????, ???? ???? ?????????? ???? ?????.  ? ????????? ????????? ???? ????????? ?????????? ???????? ??? ????????????, ????? ??????? ???? ? ????? ??????????????. ????????????? ?????????? ???????? ? ???????????? ? ?????????? [?????].  ?? ??????? ??????????? ????? ???????? ??????? ? ??? ????? ????????, ??????????? ????? ??? ??????????? ????????.  ?? ???????????? ???????? ???????.  ?????????? ????????????? ????????? ?????? ? ???????????? ? ?????????? ?????.  ???? ???? ? ????? ?? ????????, ??????? ??????????? ?????? ????? ?? ??????????? ????????????.  ?? ??????????? ??????????? ?????? ? ?????. ???? ?? ?? ?????? ????????? ?? ?????? ? ??????, ???? ? ??? ????? ??????? ? ??????????????? (???????????) ??????. ???? ???????? ?????-???? ????????, ????????? ?????, ????? ????????? ???????. ?????????? ? ?????, ????:   ???? ???? ? ????? ??  ????????, ???? ????? ???????.  ?? ???? ? ??????? ????? ????????? ???? ? ????????.  ? ??? ?????????? ???????????. ?????????? ?????????? ? ?????, ????:   ? ??? ??????????? ???? ? ??????? ??????, ??? ????????? ????, ???????? ? ????, ???, ???????, ????? ??? ?????.  ? ??? ????????? ??????.  ? ??? ???????? ??????, ??? ?? ???????????? ?????.  ? ??? ????????? ??????? ???? ? ????? ??? ??????.  ??? ???????????? ??? ?????????? ?????.  ? ??? ?????? ??????? ????????.  ?? ??????? ????????.  ? ??? ??????? ?????. ??? ?????? ??????? ????????. ?? ??????? ?????, ??? ??? ?????? ?????. ?????????? ????????? ?????? ??????????? ??????. ?????????? (? ??? ???????:  911) ????????? ? ????????? ????????? ?????? ??????. ?? ??????? ?????????????? ???????? ??????? ?? ????????. ?????????, ??? ??:   ????????? ?????? ??????????.  ?????? ?????????????? ??????? ?? ????? ??????????.  ??????????????? ?????????? ? ?????, ???? ??? ?? ?????????? ????? ??? ?????????? ????. Document Released: 12/14/2007 Document Revised: 01/05/2013 [facility] Patient Information 2015 [place]. This information is not intended to replace advice given to you by your health care provider. Make sure you discuss any questions you have with your health care provider.

## 2014-03-29 NOTE — ED Notes (Signed)
The pt returned from the xray dept

## 2014-03-29 NOTE — ED Notes (Signed)
The pts daughter has not shown up yet.

## 2014-03-29 NOTE — ED Notes (Signed)
Pt taken to xray and ct

## 2014-03-29 NOTE — ED Provider Notes (Signed)
CSN: [id number]     Arrival date & time 03/29/14  0301 History  This chart was scribed for Linton Flemings, MD by Alice Wong, ED Scribe. This patient was seen in room D34C/D34C and the patient's care was started at 4:14 AM.    Chief Complaint  Patient presents with  . Emesis   Patient is a 80 y.o. female presenting with vomiting. The history is provided by the patient and the EMS personnel. The history is limited by a language barrier. A language interpreter was used.  Emesis    HPI Comments: Alice Wong is a 80 y.o. female with past medical history of GERD, HLD, HTN, CVA, cholelithiasis, mitral regurgitation, memory loss, thrombocytopenia, diverticulosis who presents to the Emergency Department via EMS complaining of vomiting. Per nurse's notes, patient was vomiting on arrival and was cool and clammy to the touch. She was repeatedly rubbing her mid to upper abdomen.   Patient complains of a "concrete block" pressing down on her chest and SOB. This pain has been continuous for some time, but patient is unable to specify how long. She explains that she was feeling unwell earlier tonight and wanted to check her blood pressure but blacked out and woke to find herself on the floor; she could not remember the fall and "felt like she was going to die," so she called EMS. She was given "four pills" en route without relief. She reports that her chest is "burning" at present.   PCP is Dr. Alain Marion. S/P cardiac cath 12/20/13.   Past Medical History  Diagnosis Date  . Anxiety   . Depression   . GERD (gastroesophageal reflux disease)   . Hyperlipidemia   . Hypertension   . Osteoporosis   . CVA (cerebral vascular accident)   . Thyroid cyst   . Cholelithiasis   . MR (mitral regurgitation)   . Memory loss   . Gout 2009  . Vitamin D deficiency   . H. pylori infection 2011  . Vaso-vagal reaction   . Thrombocytopenia   . Diverticulosis   . S/P cardiac cath 12/20/13 12/20/2013   Past Surgical  History  Procedure Laterality Date  . Cholecystectomy  2010  . Lasik    . Cesarean section    . Left heart catheterization with coronary angiogram N/A 12/20/2013    Procedure: LEFT HEART CATHETERIZATION WITH CORONARY ANGIOGRAM;  Surgeon: Burnell Blanks, MD;  Location: [facility] CATH LAB;  Service: Cardiovascular;  Laterality: N/A;   Family History  Problem Relation Age of Onset  . Hypertension Other   . Stomach cancer Brother   . Ulcers Son   . Colon cancer Neg Hx   . Lung disease Father     Deceased   History  Substance Use Topics  . Smoking status: Never Smoker   . Smokeless tobacco: Never Used  . Alcohol Use: No   OB History    No data available     Review of Systems  Constitutional: Positive for diaphoresis.  Respiratory: Positive for shortness of breath.   Cardiovascular: Positive for chest pain.  Gastrointestinal: Positive for nausea and vomiting.  Neurological: Positive for syncope.  All other systems reviewed and are negative.  Allergies  Aricept; Coreg; and Morphine sulfate  Home Medications   Prior to Admission medications   Medication Sig Start Date End Date Taking? Authorizing Provider  acetaminophen (TYLENOL) 325 MG tablet Take 2 tablets (650 mg total) by mouth every 4 (four) hours as needed for headache or mild pain.  12/20/13   Isaiah Serge, NP  ALPRAZolam Duanne Moron) 0.25 MG tablet  03/11/14   Historical Provider, MD  amLODipine (NORVASC) 5 MG tablet TAKE ONE TABLET BY MOUTH EVERY DAY    Aleksei Plotnikov V, MD  aspirin EC 81 MG EC tablet Take 1 tablet (81 mg total) by mouth daily. 12/20/13   Isaiah Serge, NP  azelastine (OPTIVAR) 0.05 % ophthalmic solution  03/19/14   Historical Provider, MD  Cholecalciferol 1000 UNITS tablet Take 1,000 Units by mouth daily.     Historical Provider, MD  clidinium-chlordiazePOXIDE (LIBRAX) 5-2.5 MG per capsule Take 1 capsule by mouth 4 (four) times daily -  before meals and at bedtime. 01/19/14   Aleksei Plotnikov V, MD   Cyanocobalamin (VITAMIN B-12) 1000 MCG SUBL Place 1 tablet under the tongue daily.      Historical Provider, MD  donepezil (ARICEPT) 10 MG tablet Take 1 tablet (10 mg total) by mouth at bedtime. 05/06/13   Aleksei Plotnikov V, MD  FLUoxetine (PROZAC) 20 MG tablet TAKE ONE TABLET BY MOUTH ONCE DAILY 02/14/14   Aleksei Plotnikov V, MD  gabapentin (NEURONTIN) 300 MG capsule Take 1 capsule (300 mg total) by mouth 3 (three) times daily. 05/06/13   Aleksei Plotnikov V, MD  losartan (COZAAR) 100 MG tablet TAKE ONE TABLET BY MOUTH ONCE DAILY 02/14/14   Aleksei Plotnikov V, MD  NITROSTAT 0.4 MG SL tablet DISSOLVE ONE TABLET UNDER THE TONGUE EVERY 5 MINUTES AS NEEDED FOR CHEST PAIN.  DO NOT EXCEED A TOTAL OF 3 DOSES IN 15 MINUTES 10/04/13   Aleksei Plotnikov V, MD  pantoprazole (PROTONIX) 40 MG tablet Take 1 tablet (40 mg total) by mouth daily. 12/20/13   Isaiah Serge, NP  sucralfate (CARAFATE) 1 G tablet Take 1 tablet (1 g total) by mouth 4 (four) times daily -  with meals and at bedtime. 01/10/14   Burtis Junes, NP  traMADol-acetaminophen (ULTRACET) 37.5-325 MG per tablet Take 0.5-1 tablets by mouth every 6 (six) hours as needed for moderate pain or severe pain. 02/02/14   Aleksei Plotnikov V, MD  traZODone (DESYREL) 50 MG tablet Take 1 tablet (50 mg total) by mouth at bedtime. 01/20/14   Aleksei Plotnikov V, MD   BP 147/64 mmHg  Pulse 58  Temp(Src) 97.4 F (36.3 C)  Resp 20  SpO2 100% Physical Exam  Constitutional: She is oriented to person, place, and time. She appears well-developed and well-nourished. No distress.  HENT:  Head: Normocephalic and atraumatic.  Mouth/Throat: Oropharynx is clear and moist. No oropharyngeal exudate.  Moist mucous membranes  Eyes: EOM are normal. Pupils are equal, round, and reactive to light.  Neck: Normal range of motion. Neck supple. No JVD present.  Cardiovascular: Normal rate, regular rhythm and normal heart sounds.  Exam reveals no gallop and no friction rub.   No  murmur heard. Pulmonary/Chest: Effort normal and breath sounds normal. No respiratory distress. She has no wheezes. She has no rales.  Abdominal: Soft. Bowel sounds are normal. She exhibits no mass. There is no tenderness. There is no rebound and no guarding.  Musculoskeletal: Normal range of motion. She exhibits no edema.  Moves all extremities normally.   Lymphadenopathy:    She has no cervical adenopathy.  Neurological: She is alert and oriented to person, place, and time. She displays normal reflexes.  Skin: Skin is warm and dry. No rash noted.  Psychiatric: She has a normal mood and affect. Her behavior is normal.  Nursing note  and vitals reviewed.   ED Course  Procedures   DIAGNOSTIC STUDIES: Oxygen Saturation is 100% on RA, normal by my interpretation.    COORDINATION OF CARE: 4:35 AM Discussed treatment plan with pt at bedside and pt agreed to plan.   Labs Review Labs Reviewed  COMPREHENSIVE METABOLIC PANEL - Abnormal; Notable for the following:    Glucose, Bld 154 (*)    Total Protein 5.8 (*)    GFR calc non Af Amer 59 (*)    GFR calc Af Amer 69 (*)    All other components within normal limits  CBC WITH DIFFERENTIAL/PLATELET - Abnormal; Notable for the following:    HCT 34.9 (*)    Neutrophils Relative % 80 (*)    All other components within normal limits  URINE CULTURE  LIPASE, BLOOD  URINALYSIS, ROUTINE W REFLEX MICROSCOPIC  I-STAT TROPOININ, ED    Imaging Review Dg Chest 2 View  03/29/2014   CLINICAL DATA:  Chest pain and dyspnea  EXAM: CHEST  2 VIEW  COMPARISON:  03/22/2014  FINDINGS: There is mild hyperinflation and cardiomegaly, unchanged. The lungs are clear. There are no effusions. Pulmonary vasculature is normal.  IMPRESSION: Probable COPD.  No acute cardiopulmonary findings.   Electronically Signed   By: Andreas Newport M.D.   On: 03/29/2014 05:45   Ct Head Wo Contrast  03/29/2014   CLINICAL DATA:  Syncope  EXAM: CT HEAD WITHOUT CONTRAST  TECHNIQUE:  Contiguous axial images were obtained from the base of the skull through the vertex without intravenous contrast.  COMPARISON:  11/21/2012  FINDINGS: There is mild atrophy and mild periventricular hypodensity consistent with chronic microvascular disease. There is no intracranial hemorrhage, mass or evidence of acute infarction. There is no extra-axial fluid collection. There is a large retention cyst within the left maxillary sinus.  IMPRESSION: Mild atrophy and chronic microvascular ischemic disease.   Electronically Signed   By: Andreas Newport M.D.   On: 03/29/2014 05:48     EKG Interpretation   Date/Time:  Tuesday March 29 2014 03:25:14 EDT Ventricular Rate:  48 PR Interval:  187 QRS Duration: 91 QT Interval:  489 QTC Calculation: 437 R Axis:   -19 Text Interpretation:  Sinus bradycardia Ventricular premature complex  Borderline left axis deviation ST elevation, consider inferior injury  Confirmed by Ankush Gintz  MD, Ishaq Maffei ([id number]) on 03/29/2014 4:13:36 AM      MDM   Final diagnoses:  None    80 year old female who complains of feeling as though she is going to die.  Patient is a poor historian.  History obtained from interpreter and from hospitalist, who speaks [place].  Patient has had chest discomfort for some time, and tonight.  Symptoms do not seem different than what she has had prior.  Plan for CT head, chest x-ray, lab work.  Hopefully daughter will arrive to be able to give [place] more insight.  Prior records reviewed.  Patient has seen cardiology and had a nonobstructive heart catheterization in December.  She recently saw neurology and per their note, had the same complaints that she is having currently.  Will plan for a delta troponin.  If negative, patient to follow-up with Dr. Alain Marion   I personally performed the services described in this documentation, which was scribed in my presence. The recorded information has been reviewed and is accurate.       Linton Flemings,  MD 03/29/14 [id number]

## 2014-04-04 ENCOUNTER — Telehealth: Payer: Self-pay | Admitting: Neurology

## 2014-04-04 ENCOUNTER — Ambulatory Visit (INDEPENDENT_AMBULATORY_CARE_PROVIDER_SITE_OTHER): Payer: Medicare Other | Admitting: Neurology

## 2014-04-04 DIAGNOSIS — I951 Orthostatic hypotension: Secondary | ICD-10-CM

## 2014-04-04 DIAGNOSIS — R202 Paresthesia of skin: Secondary | ICD-10-CM

## 2014-04-04 DIAGNOSIS — G609 Hereditary and idiopathic neuropathy, unspecified: Secondary | ICD-10-CM

## 2014-04-04 NOTE — Telephone Encounter (Signed)
Patient has been notified that her EMG is normal.  Alice K. Posey Pronto, DO

## 2014-04-04 NOTE — Procedures (Signed)
[facility] Neu[facility]  [place], [address]  [place], [place] [address] Tel: [phone] Fax:  [phone] Test Date:  04/04/2014  Patient: Alice Wong DOB: [birth year] Physician: Narda Amber  Sex: Female Height: 5' 2.5" Ref Phys: Narda Amber  ID#: [id number] Temp: 34.0C Technician: Laureen Ochs R. NCS T.   Patient Complaints: Patient is a 80 year old female here for evaluation of her left arm and leg for episodic left sided paresthesias and burning.  NCV & EMG Findings: Extensive electrodiagnostic testing of the left upper and lower extremity shows:  1. Left median, ulnar, radial, and palmar studies are within normal limits. 2. Left median and ulnar motor responses are within normal limits. 3. Left sural and superficial peroneal sensory responses are within normal limits. 4. Left tibial and peroneal motor responses are within normal limits. 5. There is no evidence of active or chronic motor axon loss changes affecting any of the tested muscles. Motor unit configuration and recruitment pattern is within normal limits.  Impression: This is a normal study of the left upper and lower extremities.  In particular, there is no evidence of carpal tunnel syndrome, generalized sensorimotor polyneuropathy, or cervical/lumbosacral radiculopathy affecting the left side.   ___________________________ Narda Amber    Nerve Conduction Studies Anti Sensory Summary Table   Stim Site NR Peak (ms) Norm Peak (ms) P-T Amp (V) Norm P-T Amp  Left Median Anti Sensory (2nd Digit)  34C  Wrist    2.9 <3.8 18.5 >10  Left Radial Anti Sensory (Base 1st Digit)  34C  Wrist    2.1 <2.8 22.0 >10  Left Sup Peroneal Anti Sensory (Ant Lat Mall)  12 cm    3.0 <4.6 7.3 >3  Left Sural Anti Sensory (Lat Mall)  Calf    4.4 <4.6 6.1 >3  Left Ulnar Anti Sensory (5th Digit)  34C  Wrist    2.6 <3.2 8.8 >5   Motor Summary Table   Stim Site NR Onset (ms) Norm Onset (ms) O-P Amp (mV) Norm O-P Amp  Site1 Site2 Delta-0 (ms) Dist (cm) Vel (m/s) Norm Vel (m/s)  Left Median Motor (Abd Poll Brev)  34C  Wrist    3.0 <4.0 7.8 >5 Elbow Wrist 5.2 27.0 52 >50  Elbow    8.2  7.5         Left Peroneal Motor (Ext Dig Brev)  Ankle    4.4 <6.0 5.7 >2.5 B Fib Ankle 7.2 30.0 42 >40  B Fib    11.6  5.3  Poplt B Fib 1.8 8.5 47 >40  Poplt    13.4  5.1         Left Peroneal TA Motor (Tib Ant)  Fib Head    2.0 <4.5 4.2 >3 Poplit Fib Head 2.0 10.5 53 >40  Poplit    4.0  3.7         Left Tibial Motor (Abd Hall Brev)  Ankle    4.2 <6.0 7.4 >4 Knee Ankle 8.6 37.0 43 >40  Knee    12.8  5.4         Left Ulnar Motor (Abd Dig Minimi)  34C  Wrist    2.5 <3.1 8.2 >7 B Elbow Wrist 4.0 24.0 60 >50  B Elbow    6.5  7.6  A Elbow B Elbow 1.6 10.0 63 >50  A Elbow    8.1  7.2          Comparison Summary Table   Stim Site  NR Peak (ms) Norm Peak (ms) P-T Amp (V) Site1 Site2 Delta-P (ms) Norm Delta (ms)  Left Median/Ulnar Palm Comparison (Wrist - 8cm)  34C  Median Palm    2.1 <2.2 32.5 Median Palm Ulnar Palm 0.0   Ulnar Palm    2.1 <2.2 17.0       H Reflex Studies   NR H-Lat (ms) Lat Norm (ms) L-R H-Lat (ms)  Left Tibial (Gastroc)     34.42 <35    EMG   Side Muscle Ins Act Fibs Psw Fasc Number Recrt Dur Dur. Amp Amp. Poly Poly. Comment  Left 1stDorInt Nml Nml Nml Nml Nml Nml Nml Nml Nml Nml Nml Nml N/A  Left Ext Indicis Nml Nml Nml Nml Nml Nml Nml Nml Nml Nml Nml Nml N/A  Left PronatorTeres Nml Nml Nml Nml Nml Nml Nml Nml Nml Nml Nml Nml N/A  Left Biceps Nml Nml Nml Nml Nml Nml Nml Nml Nml Nml Nml Nml N/A  Left Triceps Nml Nml Nml Nml Nml Nml Nml Nml Nml Nml Nml Nml N/A  Left Deltoid Nml Nml Nml Nml Nml Nml Nml Nml Nml Nml Nml Nml N/A  Left AntTibialis Nml Nml Nml Nml Nml Nml Nml Nml Nml Nml Nml Nml N/A  Left Gastroc Nml Nml Nml Nml Nml Nml Nml Nml Nml Nml Nml Nml N/A  Left Flex Dig Long Nml Nml Nml Nml Nml Nml Nml Nml Nml Nml Nml Nml N/A  Left RectFemoris Nml Nml Nml Nml Nml Nml Nml Nml Nml Nml Nml Nml  N/A  Left GluteusMed Nml Nml Nml Nml Nml Nml Nml Nml Nml Nml Nml Nml N/A      Waveforms:

## 2014-04-05 ENCOUNTER — Ambulatory Visit: Payer: Medicare Other | Admitting: Internal Medicine

## 2014-04-05 DIAGNOSIS — H2000 Unspecified acute and subacute iridocyclitis: Secondary | ICD-10-CM | POA: Diagnosis not present

## 2014-04-13 ENCOUNTER — Ambulatory Visit (INDEPENDENT_AMBULATORY_CARE_PROVIDER_SITE_OTHER): Payer: Medicare Other | Admitting: Internal Medicine

## 2014-04-13 ENCOUNTER — Encounter: Payer: Self-pay | Admitting: Internal Medicine

## 2014-04-13 ENCOUNTER — Other Ambulatory Visit (INDEPENDENT_AMBULATORY_CARE_PROVIDER_SITE_OTHER): Payer: Medicare Other

## 2014-04-13 VITALS — BP 130/88 | HR 56 | Temp 97.1°F | Wt 157.0 lb

## 2014-04-13 DIAGNOSIS — J410 Simple chronic bronchitis: Secondary | ICD-10-CM

## 2014-04-13 DIAGNOSIS — M940 Chondrocostal junction syndrome [Tietze]: Secondary | ICD-10-CM | POA: Diagnosis not present

## 2014-04-13 DIAGNOSIS — K2100 Gastro-esophageal reflux disease with esophagitis, without bleeding: Secondary | ICD-10-CM

## 2014-04-13 DIAGNOSIS — E785 Hyperlipidemia, unspecified: Secondary | ICD-10-CM

## 2014-04-13 DIAGNOSIS — R059 Cough, unspecified: Secondary | ICD-10-CM

## 2014-04-13 DIAGNOSIS — D485 Neoplasm of uncertain behavior of skin: Secondary | ICD-10-CM | POA: Diagnosis not present

## 2014-04-13 DIAGNOSIS — I10 Essential (primary) hypertension: Secondary | ICD-10-CM

## 2014-04-13 DIAGNOSIS — R05 Cough: Secondary | ICD-10-CM

## 2014-04-13 DIAGNOSIS — F411 Generalized anxiety disorder: Secondary | ICD-10-CM

## 2014-04-13 LAB — CBC WITH DIFFERENTIAL/PLATELET
Basophils Absolute: 0 10*3/uL (ref 0.0–0.1)
Basophils Relative: 0.5 % (ref 0.0–3.0)
Eosinophils Absolute: 0.1 10*3/uL (ref 0.0–0.7)
Eosinophils Relative: 1 % (ref 0.0–5.0)
HCT: 39.8 % (ref 36.0–46.0)
Hemoglobin: 13.8 g/dL (ref 12.0–15.0)
Lymphocytes Relative: 24.8 % (ref 12.0–46.0)
Lymphs Abs: 1.5 10*3/uL (ref 0.7–4.0)
MCHC: 34.7 g/dL (ref 30.0–36.0)
MCV: 91.2 fl (ref 78.0–100.0)
Monocytes Absolute: 0.5 10*3/uL (ref 0.1–1.0)
Monocytes Relative: 8.5 % (ref 3.0–12.0)
Neutro Abs: 3.9 10*3/uL (ref 1.4–7.7)
Neutrophils Relative %: 65.2 % (ref 43.0–77.0)
Platelets: 205 10*3/uL (ref 150.0–400.0)
RBC: 4.36 Mil/uL (ref 3.87–5.11)
RDW: 12.5 % (ref 11.5–15.5)
WBC: 6 10*3/uL (ref 4.0–10.5)

## 2014-04-13 LAB — BASIC METABOLIC PANEL
BUN: 21 mg/dL (ref 6–23)
CO2: 30 mEq/L (ref 19–32)
Calcium: 9.5 mg/dL (ref 8.4–10.5)
Chloride: 102 mEq/L (ref 96–112)
Creatinine, Ser: 0.99 mg/dL (ref 0.40–1.20)
GFR: 57.7 mL/min — ABNORMAL LOW (ref >60.00)
Glucose, Bld: 129 mg/dL — ABNORMAL HIGH (ref 70–99)
Potassium: 4.1 mEq/L (ref 3.5–5.1)
Sodium: 137 mEq/L (ref 135–145)

## 2014-04-13 LAB — HEPATIC FUNCTION PANEL
ALT: 13 U/L (ref 0–35)
AST: 15 U/L (ref 0–37)
Albumin: 4.1 g/dL (ref 3.5–5.2)
Alkaline Phosphatase: 52 U/L (ref 39–117)
Bilirubin, Direct: 0.1 mg/dL (ref 0.0–0.3)
Total Bilirubin: 0.5 mg/dL (ref 0.2–1.2)
Total Protein: 6.9 g/dL (ref 6.0–8.3)

## 2014-04-13 LAB — TSH: TSH: 3.46 u[IU]/mL (ref 0.35–4.50)

## 2014-04-13 LAB — VITAMIN B12: Vitamin B-12: 560 pg/mL (ref 211–911)

## 2014-04-13 MED ORDER — UMECLIDINIUM BROMIDE 62.5 MCG/INH IN AEPB: 1.0000 | INHALATION_SPRAY | Freq: Every day | RESPIRATORY_TRACT | Status: DC

## 2014-04-13 MED ORDER — NORTRIPTYLINE HCL 10 MG PO CAPS: 10.0000 mg | ORAL_CAPSULE | Freq: Every day | ORAL | Status: DC

## 2014-04-13 NOTE — Patient Instructions (Signed)

## 2014-04-13 NOTE — Progress Notes (Signed)
Subjective:    Patient ID: Alice Wong, female    DOB: December 24, 1934, 80 y.o.   MRN: [id number]  Cough This is a recurrent problem. Episode onset: for 6 months. The problem has been unchanged. The problem occurs every few hours. The cough is productive of sputum ("white sputum"). Associated symptoms include chest pain (sharp, intermittent pain around her sternum), shortness of breath and wheezing. Pertinent negatives include no chills, ear congestion, ear pain, fever, headaches, heartburn, hemoptysis, myalgias, nasal congestion, postnasal drip, rash, rhinorrhea, sore throat, sweats or weight loss. Risk factors for lung disease include smoking/tobacco exposure (hx of asthma and second hand smoke exposure). She has tried nothing for the symptoms. The treatment provided no relief. Her past medical history is significant for asthma and COPD. There is no history of bronchiectasis, bronchitis, emphysema, environmental allergies or pneumonia.      Review of Systems  Constitutional: Negative.  Negative for fever, chills, weight loss, diaphoresis and fatigue.  HENT: Negative.  Negative for ear pain, postnasal drip, rhinorrhea, sore throat, trouble swallowing and voice change.   Eyes: Negative.   Respiratory: Positive for cough, shortness of breath and wheezing. Negative for apnea, hemoptysis, choking and chest tightness.   Cardiovascular: Positive for chest pain (sharp, intermittent pain around her sternum). Negative for palpitations and leg swelling.  Gastrointestinal: Negative.  Negative for heartburn, nausea, vomiting, abdominal pain, diarrhea and constipation.  Endocrine: Negative.   Genitourinary: Negative.   Musculoskeletal: Negative.  Negative for myalgias.  Skin: Negative.  Negative for rash.  Allergic/Immunologic: Negative.  Negative for environmental allergies.  Neurological: Negative.  Negative for dizziness and headaches.  Hematological: Negative.  Negative for adenopathy. Does not  bruise/bleed easily.  Psychiatric/Behavioral: Positive for sleep disturbance. Negative for suicidal ideas, hallucinations, behavioral problems, confusion, self-injury, dysphoric mood, decreased concentration and agitation. The patient is nervous/anxious. The patient is not hyperactive.        Objective:   Physical Exam  Constitutional: She is oriented to person, place, and time. She appears well-developed and well-nourished.  Non-toxic appearance. She does not have a sickly appearance. She does not appear ill. No distress.  HENT:  Head: Normocephalic and atraumatic.  Mouth/Throat: Oropharynx is clear and moist. No oropharyngeal exudate.  Eyes: Conjunctivae are normal. Right eye exhibits no discharge. Left eye exhibits no discharge. No scleral icterus.  Neck: Normal range of motion. Neck supple. No JVD present. No tracheal deviation present. No thyromegaly present.  Cardiovascular: Normal rate, regular rhythm, normal heart sounds and intact distal pulses.  Exam reveals no gallop and no friction rub.   No murmur heard. Pulmonary/Chest: Effort normal. No accessory muscle usage or stridor. No respiratory distress. She has no decreased breath sounds. She has no wheezes. She has rhonchi in the right middle field and the left middle field. She has no rales. Chest wall is not dull to percussion. She exhibits tenderness and bony tenderness. She exhibits no mass, no laceration, no crepitus, no edema, no deformity, no swelling and no retraction.    Abdominal: Soft. Bowel sounds are normal. She exhibits no distension and no mass. There is no tenderness. There is no rebound and no guarding.  Musculoskeletal: Normal range of motion. She exhibits no edema or tenderness.  Lymphadenopathy:    She has no cervical adenopathy.  Neurological: She is oriented to person, place, and time.  Skin: Skin is warm and dry. No rash noted. She is not diaphoretic. No erythema. No pallor.  Vitals reviewed.   Lab Results  Component Value Date   WBC 6.0 04/13/2014   HGB 13.8 04/13/2014   HCT 39.8 04/13/2014   PLT 205.0 04/13/2014   GLUCOSE 129* 04/13/2014   CHOL 166 08/23/2011   TRIG 115.0 08/23/2011   HDL 36.90* 08/23/2011   LDLCALC 106* 08/23/2011   ALT 13 04/13/2014   AST 15 04/13/2014   NA 137 04/13/2014   K 4.1 04/13/2014   CL 102 04/13/2014   CREATININE 0.99 04/13/2014   BUN 21 04/13/2014   CO2 30 04/13/2014   TSH 3.46 04/13/2014   INR 1.05 12/17/2013   HGBA1C 5.8 02/04/2013        Assessment & Plan:

## 2014-04-13 NOTE — Progress Notes (Signed)
Pre visit review using our clinic review tool, if applicable. No additional management support is needed unless otherwise documented below in the visit note. 

## 2014-04-14 NOTE — Assessment & Plan Note (Addendum)
Will start incruse for this - she was given samples and was taught how to use it, she demonstrated proficiency with its use I have asked her to have PFT/s done to confirm and to gauge the severity

## 2014-04-14 NOTE — Assessment & Plan Note (Signed)
Will treat this with pamelor I think it will help with the pain as well as the insomnia and anxiety

## 2014-04-18 DIAGNOSIS — H2000 Unspecified acute and subacute iridocyclitis: Secondary | ICD-10-CM | POA: Diagnosis not present

## 2014-04-21 ENCOUNTER — Ambulatory Visit (INDEPENDENT_AMBULATORY_CARE_PROVIDER_SITE_OTHER)
Admission: RE | Admit: 2014-04-21 | Discharge: 2014-04-21 | Disposition: A | Discharge status: Home / Self Care | Payer: Medicare Other | Source: Ambulatory Visit | Attending: Internal Medicine | Admitting: Internal Medicine

## 2014-04-21 ENCOUNTER — Encounter: Payer: Self-pay | Admitting: Internal Medicine

## 2014-04-21 ENCOUNTER — Ambulatory Visit (INDEPENDENT_AMBULATORY_CARE_PROVIDER_SITE_OTHER): Payer: Medicare Other | Admitting: Internal Medicine

## 2014-04-21 VITALS — BP 130/80 | HR 66 | Wt 157.0 lb

## 2014-04-21 DIAGNOSIS — I10 Essential (primary) hypertension: Secondary | ICD-10-CM

## 2014-04-21 DIAGNOSIS — R079 Chest pain, unspecified: Secondary | ICD-10-CM

## 2014-04-21 DIAGNOSIS — F459 Somatoform disorder, unspecified: Secondary | ICD-10-CM

## 2014-04-21 DIAGNOSIS — J9811 Atelectasis: Secondary | ICD-10-CM | POA: Diagnosis not present

## 2014-04-21 DIAGNOSIS — F418 Other specified anxiety disorders: Secondary | ICD-10-CM

## 2014-04-21 DIAGNOSIS — R0789 Other chest pain: Secondary | ICD-10-CM | POA: Insufficient documentation

## 2014-04-21 MED ORDER — DULOXETINE HCL 30 MG PO CPEP: 30.0000 mg | ORAL_CAPSULE | Freq: Every day | ORAL | Status: DC
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
## 2014-04-21 NOTE — Patient Instructions (Signed)
Stop Prozac (flupxetine), start Cymbalta (duloxetine)

## 2014-04-21 NOTE — Assessment & Plan Note (Signed)
Chronic labile Losartan, amlodipine Orthostatic lightheadedness - hard to manage - the best we can do... 

## 2014-04-21 NOTE — Assessment & Plan Note (Signed)
D/c Fluoxetine Start Cymbalta 

## 2014-04-21 NOTE — Assessment & Plan Note (Signed)
sternum "growing" lump X ray

## 2014-04-21 NOTE — Progress Notes (Signed)
Pre visit review using our clinic review tool, if applicable. No additional management support is needed unless otherwise documented below in the visit note. 

## 2014-04-21 NOTE — Assessment & Plan Note (Signed)
D/c Prozac (flupxetine), start Cymbalta (duloxetine)

## 2014-05-03 ENCOUNTER — Telehealth: Payer: Self-pay | Admitting: Internal Medicine

## 2014-05-03 NOTE — Telephone Encounter (Signed)
Pt called in about [employer] form and she said that she has to mail by the 26th.  No later.  She just wanted to call and see when it would be ready

## 2014-05-04 NOTE — Telephone Encounter (Signed)
Form completed by MD/upfront for p/i. Pt's daughter, [name] informed.

## 2014-05-09 DIAGNOSIS — H2000 Unspecified acute and subacute iridocyclitis: Secondary | ICD-10-CM | POA: Diagnosis not present

## 2014-05-11 ENCOUNTER — Telehealth: Payer: Self-pay | Admitting: Internal Medicine

## 2014-05-11 ENCOUNTER — Encounter: Payer: Self-pay | Admitting: Internal Medicine

## 2014-05-11 NOTE — Telephone Encounter (Signed)
Please advise, thanks.

## 2014-05-11 NOTE — Telephone Encounter (Signed)
States Dr. Alain Marion completed form to re certify patient to ride [employer] bus.  On form on question 5 " Does applicants disability or condition prevent patient from using regular fixed route bus system?"  It was marked No.  That means that Dr. Alain Marion feels she can ride a regular fixed route bus services.   Wants to make sure if this is what Dr. Alain Marion meant to mark.  If so please notify.  If not, needs to know if yes or sometimes with explanation of need of [employer] services.

## 2014-05-12 ENCOUNTER — Other Ambulatory Visit: Payer: Self-pay | Admitting: Internal Medicine

## 2014-05-12 DIAGNOSIS — F0630 Mood disorder due to known physiological condition, unspecified: Secondary | ICD-10-CM

## 2014-05-12 NOTE — Telephone Encounter (Signed)
Advised Alice Wong to change questions #5 to "yes" per dr plotnikovs note

## 2014-05-12 NOTE — Telephone Encounter (Signed)
Sorry, pls change to "yes" Thx

## 2014-06-17 ENCOUNTER — Other Ambulatory Visit: Payer: Self-pay | Admitting: Internal Medicine

## 2014-06-20 DIAGNOSIS — H26493 Other secondary cataract, bilateral: Secondary | ICD-10-CM | POA: Diagnosis not present

## 2014-06-20 DIAGNOSIS — Z961 Presence of intraocular lens: Secondary | ICD-10-CM | POA: Diagnosis not present

## 2014-06-20 DIAGNOSIS — H2000 Unspecified acute and subacute iridocyclitis: Secondary | ICD-10-CM | POA: Diagnosis not present

## 2014-06-22 ENCOUNTER — Ambulatory Visit (INDEPENDENT_AMBULATORY_CARE_PROVIDER_SITE_OTHER): Payer: Medicare Other | Admitting: Psychiatry

## 2014-06-22 ENCOUNTER — Encounter (HOSPITAL_COMMUNITY): Payer: Self-pay | Admitting: Psychiatry

## 2014-06-22 VITALS — BP 142/80 | HR 83 | Ht 62.75 in | Wt 167.8 lb

## 2014-06-22 DIAGNOSIS — F0630 Mood disorder due to known physiological condition, unspecified: Secondary | ICD-10-CM

## 2014-06-22 DIAGNOSIS — G3184 Mild cognitive impairment, so stated: Secondary | ICD-10-CM | POA: Diagnosis not present

## 2014-06-22 DIAGNOSIS — F329 Major depressive disorder, single episode, unspecified: Secondary | ICD-10-CM

## 2014-06-22 DIAGNOSIS — F450 Somatization disorder: Secondary | ICD-10-CM | POA: Diagnosis not present

## 2014-06-22 MED ORDER — DULOXETINE HCL 60 MG PO CPEP: 60.0000 mg | ORAL_CAPSULE | Freq: Every day | ORAL | Status: DC

## 2014-06-22 MED ORDER — DIVALPROEX SODIUM ER 250 MG PO TB24
ORAL_TABLET | ORAL | Status: DC
Start: 2014-06-22 — End: 2014-07-18

## 2014-06-22 NOTE — Progress Notes (Signed)
[facility]al [facility] Initial Assessment Note  Alice Wong [id number] 80 y.o.  06/22/2014 3:30 PM  Chief Complaint:  I don't know why I am here.  History of Present Illness:  Patient is 80 year old [place] married female who is referred from her primary care physician Dr. Vergia Alcon for the management of depression and anxiety symptoms.  Patient does not speak English and most of the information was obtained from her daughter and through Optometrist.  Apparently patient has history of anxiety and depression for many years but symptoms started to get worse when she visit last year from [place] and came back in September.  Her daughter endorse since then she's been more irritable, angry, having spells of crying, yelling and cursing.  Daughter also believe she has memory impairment and she gets easily frustrated and irritable.  There was a time when she is confused and did not know very well at the airport until her family helped her.  Her daughter is concerned because her mood has been very unstable and she cared very violent and aggressive.  Her daughter believe 1 minute she is happy in the second minute she gets very angry if she does not like anything.  Patient also complained a lot of somatic complaints including headache, body ache, dizziness, joint pain, feeling fatigue, tired and neuropathy.  Though she denies any hallucination or any paranoia but admitted easily irritable and frustrated on neighbors and family members.  She was taking Prozac for past 7-8 years until recently it was switched to Cymbalta 30 mg because it felt that it stopped working.  She is taking Cymbalta 30 mg daily, trazodone 50 mg at bedtime and also taking nortriptyline 10 mg at bedtime.  She also taking Xanax 0.25 mg to help her anxiety spells.  Daughter did not notice any major stressors in her life.  She is not sure what happened in [place] when she visited but believe she had confusion at least twice there.  She  has seen a neurologist in [place] and also seen [facility] neurology for headaches and neuropathy.  Patient appears preoccupied with her somatic complaints.  She is more focused on her chronic pain, headaches, dizziness but also admitted that sometimes she gets very angry on people.  She lives with her husband however her daughter is very supportive and helps to provide medication.  Patient denies any suicidal thoughts but admitted sometime feeling hopeless, worthless and helpless.  She admitted crying spells, anhedonia but no active suicidal plan.  Patient and her daughter is not aware of any psychiatric treatment in [place].  Patient denies any hallucination, delusions or any nightmares or flashbacks.  Suicidal Ideation: No Plan Formed: No Patient has means to carry out plan: No  Homicidal Ideation: No Plan Formed: No Patient has means to carry out plan: No  Past Psychiatric History/Hospitalization(s): Patient is taking Prozac from primary care physician for past 7-8 years for depression and anxiety symptoms.  She do not recall any inpatient psychiatric treatment or any suicidal attempt.  She admitted irritability, anger and mood swings but symptoms started to get worse in past few months.  Patient denies any history of psychosis. Anxiety: Yes Bipolar Disorder: No Depression: Yes Mania: No Psychosis: No Schizophrenia: No Personality Disorder: No Hospitalization for psychiatric illness: No History of Electroconvulsive Shock Therapy: No Prior Suicide Attempts: No  Medical History; Patient has history of GERD, hyperlipidemia, hypertension, osteoporosis, cerebrovascular accident, cholelithiasis, moderate regurgitation, homicidal pia, memory impairment, arthritis, gout, vitamin D deficiency and neuropathy.  She also endorse history of syncopal episodes when she was in [place].  She remember having extensive neurology workup.  Her primary care physician is Dr Johnanna Schneiders.  Traumatic brain  injury: Unknown traumatic head injury however history of syncopal episodes.  Family History; Patient denies any family history of psychiatric illness.  Education and Work History; Unknown.  Psychosocial History; Patient lives with her husband.  She has twin sons who lives in [place].  She has a daughter who lives close by.  Legal History; Patient denies any legal issues.  History Of Abuse; Patient denies any history of abuse.  Substance Abuse History; Patient denies any history of drinking alcohol or any illegal substance use.  Review of Systems: Psychiatric: Agitation: Yes Hallucination: No Depressed Mood: Yes Insomnia: Yes Hypersomnia: No Altered Concentration: Yes Feels Worthless: No Grandiose Ideas: No Belief In Special Powers: No New/Increased Substance Abuse: No Compulsions: No  Neurologic: Headache: Yes Seizure: No Paresthesias: Yes   Outpatient Encounter Prescriptions as of 06/22/2014  Medication Sig  . acetaminophen (TYLENOL) 325 MG tablet Take 2 tablets (650 mg total) by mouth every 4 (four) hours as needed for headache or mild pain.  Marland Kitchen ALPRAZolam (XANAX) 0.25 MG tablet   . amLODipine (NORVASC) 5 MG tablet TAKE ONE TABLET BY MOUTH EVERY DAY  . aspirin EC 81 MG EC tablet Take 1 tablet (81 mg total) by mouth daily.  Marland Kitchen azelastine (OPTIVAR) 0.05 % ophthalmic solution   . Cholecalciferol 1000 UNITS tablet Take 1,000 Units by mouth daily.   . clidinium-chlordiazePOXIDE (LIBRAX) 5-2.5 MG per capsule Take 1 capsule by mouth 4 (four) times daily -  before meals and at bedtime.  . Cyanocobalamin (VITAMIN B-12) 1000 MCG SUBL Place 1 tablet under the tongue daily.    . cyclopentolate (CYCLODRYL,CYCLOGYL) 2 % ophthalmic solution   . donepezil (ARICEPT) 10 MG tablet TAKE ONE TABLET BY MOUTH AT BEDTIME  . DULoxetine (CYMBALTA) 60 MG capsule Take 1 capsule (60 mg total) by mouth daily.  . DUREZOL 0.05 % EMUL   . gabapentin (NEURONTIN) 300 MG capsule TAKE ONE CAPSULE BY  MOUTH THREE TIMES DAILY  . losartan (COZAAR) 100 MG tablet TAKE ONE TABLET BY MOUTH ONCE DAILY  . NITROSTAT 0.4 MG SL tablet DISSOLVE ONE TABLET UNDER THE TONGUE EVERY 5 MINUTES AS NEEDED FOR CHEST PAIN.  DO NOT EXCEED A TOTAL OF 3 DOSES IN 15 MINUTES  . pantoprazole (PROTONIX) 40 MG tablet Take 1 tablet (40 mg total) by mouth daily.  . prednisoLONE acetate (PRED FORTE) 1 % ophthalmic suspension   . sucralfate (CARAFATE) 1 G tablet Take 1 tablet (1 g total) by mouth 4 (four) times daily -  with meals and at bedtime.  . traMADol-acetaminophen (ULTRACET) 37.5-325 MG per tablet Take 0.5-1 tablets by mouth every 6 (six) hours as needed for moderate pain or severe pain.  Marland Kitchen Umeclidinium Bromide (INCRUSE ELLIPTA) 62.5 MCG/INH AEPB Inhale 1 puff into the lungs daily.  . [DISCONTINUED] DULoxetine (CYMBALTA) 30 MG capsule Take 1 capsule (30 mg total) by mouth daily.  . [DISCONTINUED] nortriptyline (PAMELOR) 10 MG capsule Take 1 capsule (10 mg total) by mouth at bedtime.  . [DISCONTINUED] traZODone (DESYREL) 50 MG tablet Take 1 tablet (50 mg total) by mouth at bedtime.  . divalproex (DEPAKOTE ER) 250 MG 24 hr tablet Take 1 tab daily at bed time for 1 week and than 2 tab at bed time   No facility-administered encounter medications on file as of 06/22/2014.    Recent Results (  from the past 2160 hour(s))  Comprehensive metabolic panel     Status: Abnormal   Collection Time: 03/29/14  4:07 AM  Result Value Ref Range   Sodium 139 135 - 145 mmol/L   Potassium 4.1 3.5 - 5.1 mmol/L   Chloride 106 96 - 112 mmol/L   CO2 21 19 - 32 mmol/L   Glucose, Bld 154 (H) 70 - 99 mg/dL   BUN 14 6 - 23 mg/dL   Creatinine, Ser 9.88 0.50 - 1.10 mg/dL   Calcium 9.0 8.4 - 30.0 mg/dL   Total Protein 5.8 (L) 6.0 - 8.3 g/dL   Albumin 3.7 3.5 - 5.2 g/dL   AST 15 0 - 37 U/L   ALT 10 0 - 35 U/L   Alkaline Phosphatase 49 39 - 117 U/L   Total Bilirubin 0.6 0.3 - 1.2 mg/dL   GFR calc non Af Amer 59 (L) >90 mL/min   GFR calc Af  Amer 69 (L) >90 mL/min    Comment: (NOTE) The eGFR has been calculated using the CKD EPI equation. This calculation has not been validated in all clinical situations. eGFR's persistently <90 mL/min signify possible Chronic Kidney Disease.    Anion gap 12 5 - 15  Lipase, blood     Status: None   Collection Time: 03/29/14  4:07 AM  Result Value Ref Range   Lipase 25 11 - 59 U/L  CBC with Differential     Status: Abnormal   Collection Time: 03/29/14  4:07 AM  Result Value Ref Range   WBC 8.6 4.0 - 10.5 K/uL   RBC 3.92 3.87 - 5.11 MIL/uL   Hemoglobin 12.5 12.0 - 15.0 g/dL   HCT 33.0 (L) 89.9 - 71.6 %   MCV 89.0 78.0 - 100.0 fL   MCH 31.9 26.0 - 34.0 pg   MCHC 35.8 30.0 - 36.0 g/dL   RDW 74.9 32.8 - 12.7 %   Platelets 150 150 - 400 K/uL   Neutrophils Relative % 80 (H) 43 - 77 %   Neutro Abs 6.8 1.7 - 7.7 K/uL   Lymphocytes Relative 14 12 - 46 %   Lymphs Abs 1.2 0.7 - 4.0 K/uL   Monocytes Relative 5 3 - 12 %   Monocytes Absolute 0.5 0.1 - 1.0 K/uL   Eosinophils Relative 1 0 - 5 %   Eosinophils Absolute 0.1 0.0 - 0.7 K/uL   Basophils Relative 0 0 - 1 %   Basophils Absolute 0.0 0.0 - 0.1 K/uL  I-stat troponin, ED     Status: None   Collection Time: 03/29/14  4:10 AM  Result Value Ref Range   Troponin i, poc 0.00 0.00 - 0.08 ng/mL   Comment 3            Comment: Due to the release kinetics of cTnI, a negative result within the first hours of the onset of symptoms does not rule out myocardial infarction with certainty. If myocardial infarction is still suspected, repeat the test at appropriate intervals.   Troponin I     Status: None   Collection Time: 03/29/14  7:47 AM  Result Value Ref Range   Troponin I <0.03 <0.031 ng/mL    Comment:        NO INDICATION OF MYOCARDIAL INJURY.   Vitamin B12     Status: None   Collection Time: 04/13/14  7:28 AM  Result Value Ref Range   Vitamin B-12 560 211 - 911 pg/mL  TSH  Status: None   Collection Time: 04/13/14  7:28 AM   Result Value Ref Range   TSH 3.46 0.35 - 4.50 uIU/mL  Basic metabolic panel     Status: Abnormal   Collection Time: 04/13/14  7:28 AM  Result Value Ref Range   Sodium 137 135 - 145 mEq/L   Potassium 4.1 3.5 - 5.1 mEq/L   Chloride 102 96 - 112 mEq/L   CO2 30 19 - 32 mEq/L   Glucose, Bld 129 (H) 70 - 99 mg/dL   BUN 21 6 - 23 mg/dL   Creatinine, Ser 0.99 0.40 - 1.20 mg/dL   Calcium 9.5 8.4 - 10.5 mg/dL   GFR 57.70 (L) >60.00 mL/min  Hepatic function panel     Status: None   Collection Time: 04/13/14  7:28 AM  Result Value Ref Range   Total Bilirubin 0.5 0.2 - 1.2 mg/dL   Bilirubin, Direct 0.1 0.0 - 0.3 mg/dL   Alkaline Phosphatase 52 39 - 117 U/L   AST 15 0 - 37 U/L   ALT 13 0 - 35 U/L   Total Protein 6.9 6.0 - 8.3 g/dL   Albumin 4.1 3.5 - 5.2 g/dL  CBC with Differential     Status: None   Collection Time: 04/13/14  7:28 AM  Result Value Ref Range   WBC 6.0 4.0 - 10.5 K/uL   RBC 4.36 3.87 - 5.11 Mil/uL   Hemoglobin 13.8 12.0 - 15.0 g/dL   HCT 39.8 36.0 - 46.0 %   MCV 91.2 78.0 - 100.0 fl   MCHC 34.7 30.0 - 36.0 g/dL   RDW 12.5 11.5 - 15.5 %   Platelets 205.0 150.0 - 400.0 K/uL   Neutrophils Relative % 65.2 43.0 - 77.0 %   Lymphocytes Relative 24.8 12.0 - 46.0 %   Monocytes Relative 8.5 3.0 - 12.0 %   Eosinophils Relative 1.0 0.0 - 5.0 %   Basophils Relative 0.5 0.0 - 3.0 %   Neutro Abs 3.9 1.4 - 7.7 K/uL   Lymphs Abs 1.5 0.7 - 4.0 K/uL   Monocytes Absolute 0.5 0.1 - 1.0 K/uL   Eosinophils Absolute 0.1 0.0 - 0.7 K/uL   Basophils Absolute 0.0 0.0 - 0.1 K/uL      Constitutional:  BP 142/80 mmHg  Pulse 83  Ht 5' 2.75" (1.594 m)  Wt 167 lb 12.8 oz (76.114 kg)  BMI 29.96 kg/m2   Musculoskeletal: Strength & Muscle Tone: decreased Gait & Station: normal Patient leans: N/A  Psychiatric Specialty Exam: General Appearance: Casual  Eye Contact::  Fair  Speech:  NA  Volume:  Normal  Mood:  Anxious, Depressed and Irritable  Affect:  Non-Congruent and Labile   Thought Process:  Circumstantial  Orientation:  Full (Time, Place, and Person)  Thought Content:  Rumination  Suicidal Thoughts:  No  Homicidal Thoughts:  No  Memory:  Immediate;   Poor Recent;   Fair Remote;   Fair  Judgement:  Fair  Insight:  Lacking  Psychomotor Activity:  Increased  Concentration:  Poor  Recall:  [place] of Knowledge:  Poor  Language:  Poor  Akathisia:  No  Handed:  Right  AIMS (if indicated):     Assets:  Desire for Improvement Financial Resources/Insurance Housing Social Support  ADL's:  Intact  Cognition:  Impaired,  Mild  Sleep:        New problem, with additional work up planned, Review of Psycho-Social Stressors (1), Review or order clinical lab tests (1),  Decision to obtain old records (1), Review and summation of old records (2), Established Problem, Worsening (2), New Problem, with no additional work-up planned (3), Review of Medication Regimen & Side Effects (2) and Review of New Medication or Change in Dosage (2)  Assessment: Axis I: Mood disorder due to general medical condition.  Depression due to memory impairment and other medical illnesses.  Depressive disorder NOS, cognitive impairment NOS  Axis II: Deferred  Axis III:  Past Medical History  Diagnosis Date  . Anxiety   . Depression   . GERD (gastroesophageal reflux disease)   . Hyperlipidemia   . Hypertension   . Osteoporosis   . CVA (cerebral vascular accident)   . Thyroid cyst   . Cholelithiasis   . MR (mitral regurgitation)   . Memory loss   . Gout 2009  . Vitamin D deficiency   . H. pylori infection 2011  . Vaso-vagal reaction   . Thrombocytopenia   . Diverticulosis   . S/P cardiac cath 12/20/13 12/20/2013     Plan:  I review her symptoms, history, current medication and recent blood work results.  Patient has lot of somatic complaints however recent change in her behavior for past few months is noticeable.  She is taking Cymbalta, trazodone, nortriptyline and  Xanax and she still have a lot of symptoms of irritability, anger, mood lability and depression.  I encourage her to see neurologist since she has not seen neurology in a while.  I also recommended to increase Cymbalta 60 mg and discontinue nortriptyline and trazodone since patient and the daughter does not feel it is working very well.  I will add Depakote 250 mg at bedtime and she can gradually increase to 500 if she can tolerate the side effects and sedation in one week to 10 days.  I also discuss in length about medication side effects especially weight gain, metabolic syndrome, sedation and tremors.  He also discuss safety plan that anytime having active suicidal thoughts or homicidal thoughts and she need to call 911 or go to local emergency room.  Due to language barrier patient does not want any counseling at this time however she will consider in the future if needed.  I will see her again in 3 weeks.  Anouk Critzer T., MD 06/22/2014

## 2014-06-23 ENCOUNTER — Ambulatory Visit (INDEPENDENT_AMBULATORY_CARE_PROVIDER_SITE_OTHER): Payer: Medicare Other | Admitting: Internal Medicine

## 2014-06-23 DIAGNOSIS — R05 Cough: Secondary | ICD-10-CM

## 2014-06-23 DIAGNOSIS — J410 Simple chronic bronchitis: Secondary | ICD-10-CM

## 2014-06-23 DIAGNOSIS — R059 Cough, unspecified: Secondary | ICD-10-CM

## 2014-06-23 LAB — PULMONARY FUNCTION TEST
DL/VA % pred: 94 %
DL/VA: 4.46 ml/min/mmHg/L
DLCO unc % pred: 101 %
DLCO unc: 24.05 ml/min/mmHg
FEF 25-75 Post: 1.34 L/sec
FEF 25-75 Pre: 1.22 L/sec
FEF2575-%Change-Post: 10 %
FEF2575-%Pred-Post: 90 %
FEF2575-%Pred-Pre: 81 %
FEV1-%Change-Post: 3 %
FEV1-%Pred-Post: 113 %
FEV1-%Pred-Pre: 109 %
FEV1-Post: 2.24 L
FEV1-Pre: 2.16 L
FEV1FVC-%Change-Post: 3 %
FEV1FVC-%Pred-Pre: 87 %
FEV6-%Change-Post: 4 %
FEV6-%Pred-Post: 133 %
FEV6-%Pred-Pre: 128 %
FEV6-Post: 3.34 L
FEV6-Pre: 3.2 L
FEV6FVC-%Change-Post: 2 %
FEV6FVC-%Pred-Post: 104 %
FEV6FVC-%Pred-Pre: 102 %
FVC-%Change-Post: 0 %
FVC-%Pred-Post: 127 %
FVC-%Pred-Pre: 126 %
FVC-Post: 3.35 L
FVC-Pre: 3.35 L
Post FEV1/FVC ratio: 67 %
Post FEV6/FVC ratio: 100 %
Pre FEV1/FVC ratio: 64 %
Pre FEV6/FVC Ratio: 97 %

## 2014-06-23 NOTE — Progress Notes (Signed)
PFT done today. 

## 2014-07-11 ENCOUNTER — Other Ambulatory Visit: Payer: Self-pay

## 2014-07-18 ENCOUNTER — Other Ambulatory Visit (HOSPITAL_COMMUNITY): Payer: Self-pay | Admitting: Psychiatry

## 2014-07-18 DIAGNOSIS — F32A Depression, unspecified: Secondary | ICD-10-CM

## 2014-07-18 DIAGNOSIS — F450 Somatization disorder: Principal | ICD-10-CM

## 2014-07-18 DIAGNOSIS — F0630 Mood disorder due to known physiological condition, unspecified: Secondary | ICD-10-CM

## 2014-07-18 DIAGNOSIS — F329 Major depressive disorder, single episode, unspecified: Secondary | ICD-10-CM

## 2014-07-19 NOTE — Telephone Encounter (Signed)
Dr. Dwyane Dee authorized a one time refill of patient's prescribed Depakote and Cymbalta until patient returns to see Dr. Adele Schilder on 08/08/14.  Both orders e-scribed to patient's [facility] in [place].

## 2014-07-21 ENCOUNTER — Ambulatory Visit (INDEPENDENT_AMBULATORY_CARE_PROVIDER_SITE_OTHER): Payer: Medicare Other | Admitting: Internal Medicine

## 2014-07-21 ENCOUNTER — Encounter: Payer: Self-pay | Admitting: Internal Medicine

## 2014-07-21 VITALS — BP 140/82 | HR 78 | Wt 166.0 lb

## 2014-07-21 DIAGNOSIS — I6389 Other cerebral infarction: Secondary | ICD-10-CM

## 2014-07-21 DIAGNOSIS — I639 Cerebral infarction, unspecified: Secondary | ICD-10-CM | POA: Insufficient documentation

## 2014-07-21 DIAGNOSIS — F418 Other specified anxiety disorders: Secondary | ICD-10-CM

## 2014-07-21 DIAGNOSIS — I638 Other cerebral infarction: Secondary | ICD-10-CM | POA: Diagnosis not present

## 2014-07-21 DIAGNOSIS — R55 Syncope and collapse: Secondary | ICD-10-CM

## 2014-07-21 DIAGNOSIS — F0391 Unspecified dementia with behavioral disturbance: Secondary | ICD-10-CM

## 2014-07-21 DIAGNOSIS — I10 Essential (primary) hypertension: Secondary | ICD-10-CM | POA: Diagnosis not present

## 2014-07-21 DIAGNOSIS — F0392 Unspecified dementia, unspecified severity, with psychotic disturbance: Secondary | ICD-10-CM

## 2014-07-21 DIAGNOSIS — R413 Other amnesia: Secondary | ICD-10-CM

## 2014-07-21 DIAGNOSIS — Z8679 Personal history of other diseases of the circulatory system: Secondary | ICD-10-CM

## 2014-07-21 NOTE — Assessment & Plan Note (Signed)
No recent relapse No driving!

## 2014-07-21 NOTE — Assessment & Plan Note (Signed)
Chronic labile - better Losartan, amlodipine Orthostatic lightheadedness - hard to manage - the best we can do... Risks associated with treatment noncompliance were discussed. Compliance was encouraged.  BP Readings from Last 3 Encounters:  07/21/14 140/82  06/22/14 142/80  04/21/14 130/80
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
## 2014-07-21 NOTE — Assessment & Plan Note (Addendum)
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 Alice Wong is noncompliant w/meds 7/16 Worsening dementia Alice Wong seems to need a lot of supervision. She would benefit from NHP

## 2014-07-21 NOTE — Assessment & Plan Note (Signed)
Remote. On ASA

## 2014-07-21 NOTE — Assessment & Plan Note (Addendum)
Chronic 7/16 Worsening dementia Alice Wong seems to need a lot of supervision. She would benefit from NHP

## 2014-07-21 NOTE — Progress Notes (Signed)
Pre visit review using our clinic review tool, if applicable. No additional management support is needed unless otherwise documented below in the visit note. 

## 2014-07-28 DIAGNOSIS — F0391 Unspecified dementia with behavioral disturbance: Secondary | ICD-10-CM | POA: Insufficient documentation

## 2014-07-28 DIAGNOSIS — F0390 Unspecified dementia without behavioral disturbance: Secondary | ICD-10-CM | POA: Insufficient documentation

## 2014-07-28 NOTE — Assessment & Plan Note (Signed)
  2016 Chronic dementia w/psychotic traits - worse. Alice Wong is noncompliant w/meds 7/16 Worsening dementia Alice Wong seems to need a lot of supervision. She would benefit from NHP

## 2014-08-03 ENCOUNTER — Encounter: Payer: Self-pay | Admitting: Gastroenterology

## 2014-08-08 ENCOUNTER — Encounter (HOSPITAL_COMMUNITY): Payer: Self-pay | Admitting: Psychiatry

## 2014-08-08 ENCOUNTER — Ambulatory Visit (INDEPENDENT_AMBULATORY_CARE_PROVIDER_SITE_OTHER): Payer: Medicare Other | Admitting: Psychiatry

## 2014-08-08 VITALS — BP 138/86 | HR 70 | Ht 62.75 in | Wt 164.8 lb

## 2014-08-08 DIAGNOSIS — R413 Other amnesia: Secondary | ICD-10-CM | POA: Diagnosis not present

## 2014-08-08 DIAGNOSIS — F329 Major depressive disorder, single episode, unspecified: Secondary | ICD-10-CM

## 2014-08-08 DIAGNOSIS — F450 Somatization disorder: Principal | ICD-10-CM

## 2014-08-08 DIAGNOSIS — F0630 Mood disorder due to known physiological condition, unspecified: Secondary | ICD-10-CM

## 2014-08-08 DIAGNOSIS — F28 Other psychotic disorder not due to a substance or known physiological condition: Secondary | ICD-10-CM | POA: Diagnosis not present

## 2014-08-08 MED ORDER — OLANZAPINE 5 MG PO TABS: ORAL_TABLET | ORAL | Status: DC

## 2014-08-08 MED ORDER — DULOXETINE HCL 60 MG PO CPEP: 60.0000 mg | ORAL_CAPSULE | Freq: Every day | ORAL | Status: DC
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
## 2014-08-09 ENCOUNTER — Encounter: Payer: Self-pay | Admitting: Internal Medicine

## 2014-08-10 ENCOUNTER — Other Ambulatory Visit: Payer: Self-pay | Admitting: Internal Medicine

## 2014-08-10 DIAGNOSIS — F03918 Unspecified dementia, unspecified severity, with other behavioral disturbance: Secondary | ICD-10-CM

## 2014-08-10 DIAGNOSIS — F0391 Unspecified dementia with behavioral disturbance: Secondary | ICD-10-CM

## 2014-08-16 ENCOUNTER — Encounter: Payer: Self-pay | Admitting: Internal Medicine

## 2014-08-16 ENCOUNTER — Other Ambulatory Visit (INDEPENDENT_AMBULATORY_CARE_PROVIDER_SITE_OTHER): Payer: Medicare Other

## 2014-08-16 ENCOUNTER — Ambulatory Visit (INDEPENDENT_AMBULATORY_CARE_PROVIDER_SITE_OTHER): Payer: Medicare Other | Admitting: Internal Medicine

## 2014-08-16 VITALS — BP 180/84 | HR 71 | Temp 98.4°F | Resp 16 | Wt 167.0 lb

## 2014-08-16 DIAGNOSIS — I638 Other cerebral infarction: Secondary | ICD-10-CM

## 2014-08-16 DIAGNOSIS — R739 Hyperglycemia, unspecified: Secondary | ICD-10-CM

## 2014-08-16 DIAGNOSIS — M10071 Idiopathic gout, right ankle and foot: Secondary | ICD-10-CM

## 2014-08-16 LAB — HEMOGLOBIN A1C: Hgb A1c MFr Bld: 6.1 % (ref 4.6–6.5)

## 2014-08-16 LAB — URIC ACID: Uric Acid, Serum: 8.2 mg/dL — ABNORMAL HIGH (ref 2.4–7.0)

## 2014-08-16 MED ORDER — PREDNISONE 10 MG PO TABS: ORAL_TABLET | ORAL | Status: DC

## 2014-08-16 MED ORDER — METHYLPREDNISOLONE ACETATE 40 MG/ML IJ SUSP: 40.0000 mg | Freq: Once | INTRAMUSCULAR | Status: DC

## 2014-08-16 MED ORDER — METHYLPREDNISOLONE ACETATE 40 MG/ML IJ SUSP
40.0000 mg | Freq: Once | INTRAMUSCULAR | Status: DC
  Administered 2014-08-16: 40 mg via INTRAMUSCULAR
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
## 2014-08-16 NOTE — Addendum Note (Signed)
Addended by: Terence Lux B on: 08/16/2014 02:18 PM   Modules accepted: Orders

## 2014-08-16 NOTE — Patient Instructions (Signed)
  Your next office appointment will be determined based upon review of your pending labs  and  xrays  Those written interpretation of the lab results and instructions will be transmitted to you by My Chart   Critical results will be called.   Followup as needed for any active or acute issue. Please report any significant change in your symptoms. 

## 2014-08-16 NOTE — Progress Notes (Signed)
Pre visit review using our clinic review tool, if applicable. No additional management support is needed unless otherwise documented below in the visit note. 

## 2014-08-17 ENCOUNTER — Encounter: Payer: Self-pay | Admitting: Internal Medicine

## 2014-08-23 ENCOUNTER — Ambulatory Visit (INDEPENDENT_AMBULATORY_CARE_PROVIDER_SITE_OTHER): Payer: Medicare Other

## 2014-08-23 ENCOUNTER — Ambulatory Visit (INDEPENDENT_AMBULATORY_CARE_PROVIDER_SITE_OTHER): Payer: Medicare Other | Admitting: Family Medicine

## 2014-08-23 VITALS — BP 142/72 | HR 78 | Temp 98.3°F | Resp 17 | Ht 63.0 in | Wt 169.0 lb

## 2014-08-23 DIAGNOSIS — R42 Dizziness and giddiness: Secondary | ICD-10-CM | POA: Diagnosis not present

## 2014-08-23 DIAGNOSIS — M79602 Pain in left arm: Secondary | ICD-10-CM

## 2014-08-23 DIAGNOSIS — I638 Other cerebral infarction: Secondary | ICD-10-CM

## 2014-08-23 DIAGNOSIS — R739 Hyperglycemia, unspecified: Secondary | ICD-10-CM | POA: Diagnosis not present

## 2014-08-23 LAB — POCT CBC
Granulocyte percent: 86.9 %G — AB (ref 37–80)
HCT, POC: 36.9 % — AB (ref 37.7–47.9)
Hemoglobin: 12.5 g/dL (ref 12.2–16.2)
Lymph, poc: 0.9 (ref 0.6–3.4)
MCH, POC: 30.3 pg (ref 27–31.2)
MCHC: 33.8 g/dL (ref 31.8–35.4)
MCV: 89.8 fL (ref 80–97)
MID (cbc): 0.5 (ref 0–0.9)
MPV: 7.1 fL (ref 0–99.8)
POC Granulocyte: 9 — AB (ref 2–6.9)
POC LYMPH PERCENT: 8.6 %L — AB (ref 10–50)
POC MID %: 4.5 %M (ref 0–12)
Platelet Count, POC: 262 10*3/uL (ref 142–424)
RBC: 4.11 M/uL (ref 4.04–5.48)
RDW, POC: 12.8 %
WBC: 10.3 10*3/uL — AB (ref 4.6–10.2)

## 2014-08-23 LAB — GLUCOSE, POCT (MANUAL RESULT ENTRY): POC Glucose: 234 mg/dl — AB (ref 70–99)

## 2014-08-23 NOTE — Progress Notes (Addendum)
[facility] [address], [place] [address] [phone]  Date:  08/23/2014   Name:  Alice Wong   DOB:  [birth year]   MRN:  [id number]  PCP:  Walker Kehr, MD    Chief Complaint: Wrist Pain and Head Injury   History of Present Illness:  Alice Wong is a 80 y.o. very pleasant female patient who presents with the following:  Older lady here today with a fall that occurred a couple of hours ago at home. Her daughter brought her in for evalaution.  She hit her head on the coffee table and her left arm as well.  Her left arm is painful so they would like to rule- out fracture   No known LOC.   No vomiting Pt endorses mild tenderness of her head at site of contusion but no severe HA States that she started to feel a bit dizzy, began to lose her balance and then tried to catch herself but fell anyway She does have a history of past CVA and dementia.  There is also a language barrier but her daughter interprets for our visit.  Her daughter states that she does not appreciate any acute change in her mom's mental status  Patient Active Problem List   Diagnosis Date Noted  . Dementia with psychosis 07/28/2014  . CVA (cerebral infarction) 07/21/2014  . Sternum pain 04/21/2014  . Costochondritis 04/13/2014  . Simple chronic bronchitis 04/13/2014  . Redness of right eye 03/23/2014  . Cough 03/22/2014  . S/P cardiac cath 12/20/13, non obsteructive disease 12/20/2013  . Abnormal nuclear stress test, false positive per cath   . Vasovagal syncope 12/17/2013  . Midsternal chest pain 12/17/2013  . Neoplasm of uncertain behavior of skin 10/27/2013  . Hyperglycemia 11/04/2012  . Pain of right thumb 12/20/2011  . Psychosomatic disorder 08/23/2011  . Tinnitus 06/04/2011  . Headache, post-traumatic, chronic 05/01/2011  . LBP (low back pain) 01/30/2011  . Insomnia 11/19/2010  . Actinic keratoses 05/23/2010  . Dizziness 04/09/2010  . Bruising 04/09/2010  .  Thrombocytopenia 01/02/2010  . HEMORRHOIDS 12/12/2009  . HELICOBACTER PYLORI INFECTION 03/17/2009  . PARESTHESIA 03/17/2009  . Chest pain, unspecified 08/01/2008  . VITAMIN D DEFICIENCY 03/03/2008  . GAIT IMBALANCE 10/22/2007  . Gout, unspecified 05/07/2007  . FOOT PAIN 05/07/2007  . HYPERLIPIDEMIA 02/05/2007  . Anxiety state 02/05/2007  . Depression with anxiety 02/05/2007  . Essential hypertension 02/05/2007  . GERD 02/05/2007  . OSTEOPOROSIS 02/05/2007  . Memory loss 02/05/2007  . History of cardiovascular disorder 02/05/2007    Past Medical History  Diagnosis Date  . Anxiety   . Depression   . GERD (gastroesophageal reflux disease)   . Hyperlipidemia   . Hypertension   . Osteoporosis   . CVA (cerebral vascular accident)   . Thyroid cyst   . Cholelithiasis   . MR (mitral regurgitation)   . Memory loss   . Gout 2009  . Vitamin D deficiency   . H. pylori infection 2011  . Vaso-vagal reaction   . Thrombocytopenia   . Diverticulosis   . S/P cardiac cath 12/20/13 12/20/2013    Past Surgical History  Procedure Laterality Date  . Cholecystectomy  2010  . Lasik    . Cesarean section    . Left heart catheterization with coronary angiogram N/A 12/20/2013    Procedure: LEFT HEART CATHETERIZATION WITH CORONARY ANGIOGRAM;  Surgeon: Burnell Blanks, MD;  Location: [facility] CATH LAB;  Service: Cardiovascular;  Laterality: N/A;    History  Substance Use Topics  . Smoking status: Never Smoker   . Smokeless tobacco: Never Used  . Alcohol Use: No    Family History  Problem Relation Age of Onset  . Hypertension Other   . Stomach cancer Brother   . Ulcers Son   . Colon cancer Neg Hx   . Lung disease Father     Deceased    Allergies  Allergen Reactions  . Aricept [Donepezil Hcl]     ?dry tongue  . Coreg [Carvedilol]     ?problems  . Morphine Sulfate Other (See Comments)    REACTION: decreased blood pressure    Medication list has been reviewed and  updated.  Current Outpatient Prescriptions on File Prior to Visit  Medication Sig Dispense Refill  . amLODipine (NORVASC) 5 MG tablet TAKE ONE TABLET BY MOUTH EVERY DAY 90 tablet 3  . aspirin EC 81 MG EC tablet Take 1 tablet (81 mg total) by mouth daily.    . Cholecalciferol 1000 UNITS tablet Take 1,000 Units by mouth daily.     . Cyanocobalamin (VITAMIN B-12) 1000 MCG SUBL Place 1 tablet under the tongue daily.      . cyclopentolate (CYCLODRYL,CYCLOGYL) 2 % ophthalmic solution     . donepezil (ARICEPT) 10 MG tablet TAKE ONE TABLET BY MOUTH AT BEDTIME 90 tablet 3  . DULoxetine (CYMBALTA) 60 MG capsule Take 1 capsule (60 mg total) by mouth daily. 30 capsule 0  . gabapentin (NEURONTIN) 300 MG capsule TAKE ONE CAPSULE BY MOUTH THREE TIMES DAILY 90 capsule 11  . losartan (COZAAR) 100 MG tablet TAKE ONE TABLET BY MOUTH ONCE DAILY 30 tablet 5  . NITROSTAT 0.4 MG SL tablet DISSOLVE ONE TABLET UNDER THE TONGUE EVERY 5 MINUTES AS NEEDED FOR CHEST PAIN.  DO NOT EXCEED A TOTAL OF 3 DOSES IN 15 MINUTES 25 tablet 0  . OLANZapine (ZYPREXA) 5 MG tablet Take 1-2 tab at bed time 60 tablet 0  . predniSONE (DELTASONE) 10 MG tablet 1 tid pc 15 tablet 0   No current facility-administered medications on file prior to visit.    Review of Systems:  As per HPI- otherwise negative.   Physical Examination: Filed Vitals:   08/23/14 1958  BP: 142/72  Pulse: 78  Temp: 98.3 F (36.8 C)  Resp: 17   Filed Vitals:   08/23/14 1958  Height: 5\' 3"  (1.6 m)  Weight: 169 lb (76.658 kg)   Body mass index is 29.94 kg/(m^2). Ideal Body Weight: Weight in (lb) to have BMI = 25: 140.8  GEN: WDWN, NAD, Non-toxic, A & O x 3, elderly lady in no acute distress but holding her left arm flexed at the elbow and close to her body HEENT: Atraumatic, Normocephalic. Neck supple. No masses, No LAD.  Bilateral TM wnl, oropharynx normal.  PEERL,EOMI.   Small contusion on the crown of her head, no step off, crepitus or laceration.   She denies any cervical spine TTP Ears and Nose: No external deformity. CV: RRR, No M/G/R. No JVD. No thrill. No extra heart sounds. PULM: CTA B, no wheezes, crackles, rhonchi. No retractions. No resp. distress. No accessory muscle use. EXTR: No c/c/e NEURO Normal gait.  PSYCH: Normally interactive.  Not depressed or anxious appearing.  Calm demeanor.  Left UE: diffuse tenderness that limits exam.  She notes tenderness to palpation over the arm from mid humerus to distal forearm.  Hand is NV intact.  Shoulder is located and  non- tender.  Hand is non-tender  [facility] reading (PRIMARY) by  Dr. Lorelei Pont. Left humerus:neagtive Left elbow: Left forearm: negative  LEFT HUMERUS - 2+ VIEW  COMPARISON: None.  FINDINGS: Frontal and lateral views were obtained. There is no fracture or dislocation. Joint spaces appear intact. No erosive change.  IMPRESSION: No fracture or dislocation. No appreciable arthropathic change.  LEFT ELBOW - COMPLETE 3+ VIEW  COMPARISON: None.  FINDINGS: Frontal, oblique, and lateral views were obtained. There is no demonstrable fracture or dislocation. No joint effusion. Joint spaces appear intact. No erosive change.  IMPRESSION: No fracture or dislocation. No appreciable arthropathy.  LEFT FOREARM - 2 VIEW  COMPARISON: None.  FINDINGS: Frontal and lateral views were obtained. No fracture or dislocation. Joint spaces appear intact. No erosive change.  IMPRESSION: No fracture or dislocation. No appreciable arthropathy.  Results for orders placed or performed in visit on 08/23/14  POCT CBC  Result Value Ref Range   WBC 10.3 (A) 4.6 - 10.2 K/uL   Lymph, poc 0.9 0.6 - 3.4   POC LYMPH PERCENT 8.6 (A) 10 - 50 %L   MID (cbc) 0.5 0 - 0.9   POC MID % 4.5 0 - 12 %M   POC Granulocyte 9.0 (A) 2 - 6.9   Granulocyte percent 86.9 (A) 37 - 80 %G   RBC 4.11 4.04 - 5.48 M/uL   Hemoglobin 12.5 12.2 - 16.2 g/dL   HCT, POC 36.9 (A) 37.7 - 47.9 %   MCV  89.8 80 - 97 fL   MCH, POC 30.3 27 - 31.2 pg   MCHC 33.8 31.8 - 35.4 g/dL   RDW, POC 12.8 %   Platelet Count, POC 262 142 - 424 K/uL   MPV 7.1 0 - 99.8 fL  POCT glucose (manual entry)  Result Value Ref Range   POC Glucose 234 (A) 70 - 99 mg/dl    Assessment and Plan: Pain of left arm - Plan: DG Humerus Left, DG Elbow Complete Left, DG Forearm Left  Dizziness and giddiness - Plan: POCT CBC, POCT glucose (manual entry), Comprehensive metabolic panel  Hyperglycemia  No apparent fracture of her left arm Offered to have her seen at the ER for further evaluation for any other injury but they decline.  Given concerning signs/ sx to watch for and seek care if these occur Placed in an arm sling for support.    Signed Lamar Blinks, MD Received the rest of her labs 8/10- will send a letter  Lab Results  Component Value Date   HGBA1C 6.1 08/16/2014     Results for orders placed or performed in visit on 08/23/14  Comprehensive metabolic panel  Result Value Ref Range   Sodium 140 135 - 146 mmol/L   Potassium 4.6 3.5 - 5.3 mmol/L   Chloride 103 98 - 110 mmol/L   CO2 24 20 - 31 mmol/L   Glucose, Bld 226 (H) 65 - 99 mg/dL   BUN 24 7 - 25 mg/dL   Creat 1.14 (H) 0.60 - 0.93 mg/dL   Total Bilirubin 0.4 0.2 - 1.2 mg/dL   Alkaline Phosphatase 62 33 - 130 U/L   AST 20 10 - 35 U/L   ALT 21 6 - 29 U/L   Total Protein 6.6 6.1 - 8.1 g/dL   Albumin 4.1 3.6 - 5.1 g/dL   Calcium 8.9 8.6 - 10.4 mg/dL  POCT CBC  Result Value Ref Range   WBC 10.3 (A) 4.6 - 10.2 K/uL   Lymph,  poc 0.9 0.6 - 3.4   POC LYMPH PERCENT 8.6 (A) 10 - 50 %L   MID (cbc) 0.5 0 - 0.9   POC MID % 4.5 0 - 12 %M   POC Granulocyte 9.0 (A) 2 - 6.9   Granulocyte percent 86.9 (A) 37 - 80 %G   RBC 4.11 4.04 - 5.48 M/uL   Hemoglobin 12.5 12.2 - 16.2 g/dL   HCT, POC 36.9 (A) 37.7 - 47.9 %   MCV 89.8 80 - 97 fL   MCH, POC 30.3 27 - 31.2 pg   MCHC 33.8 31.8 - 35.4 g/dL   RDW, POC 12.8 %   Platelet Count, POC 262 142 - 424  K/uL   MPV 7.1 0 - 99.8 fL  POCT glucose (manual entry)  Result Value Ref Range   POC Glucose 234 (A) 70 - 99 mg/dl

## 2014-08-23 NOTE — Patient Instructions (Signed)
It does not appear that you have any fracture- good news!  Apply ice to the painful area as needed, and wear your sling as needed for support If you have any severe headache or nausea/ vomiting please go to the ER. If your arm does not feel better in the next few days please return for a repeat x-ray

## 2014-08-24 ENCOUNTER — Other Ambulatory Visit: Payer: Self-pay | Admitting: Emergency Medicine

## 2014-08-24 LAB — COMPREHENSIVE METABOLIC PANEL
ALT: 21 U/L (ref 6–29)
AST: 20 U/L (ref 10–35)
Albumin: 4.1 g/dL (ref 3.6–5.1)
Alkaline Phosphatase: 62 U/L (ref 33–130)
BUN: 24 mg/dL (ref 7–25)
CO2: 24 mmol/L (ref 20–31)
Calcium: 8.9 mg/dL (ref 8.6–10.4)
Chloride: 103 mmol/L (ref 98–110)
Creat: 1.14 mg/dL — ABNORMAL HIGH (ref 0.60–0.93)
Glucose, Bld: 226 mg/dL — ABNORMAL HIGH (ref 65–99)
Potassium: 4.6 mmol/L (ref 3.5–5.3)
Sodium: 140 mmol/L (ref 135–146)
Total Bilirubin: 0.4 mg/dL (ref 0.2–1.2)
Total Protein: 6.6 g/dL (ref 6.1–8.1)

## 2014-08-24 MED ORDER — FEBUXOSTAT 40 MG PO TABS: 40.0000 mg | ORAL_TABLET | Freq: Every day | ORAL | Status: DC

## 2014-08-25 ENCOUNTER — Encounter: Payer: Self-pay | Admitting: Internal Medicine

## 2014-08-31 DIAGNOSIS — Z961 Presence of intraocular lens: Secondary | ICD-10-CM | POA: Diagnosis not present

## 2014-08-31 DIAGNOSIS — H26493 Other secondary cataract, bilateral: Secondary | ICD-10-CM | POA: Diagnosis not present

## 2014-09-02 ENCOUNTER — Telehealth: Payer: Self-pay | Admitting: Emergency Medicine

## 2014-09-02 NOTE — Telephone Encounter (Signed)
PA for Ulor[employer] started.

## 2014-09-05 ENCOUNTER — Encounter: Payer: Self-pay | Admitting: Neurology

## 2014-09-05 ENCOUNTER — Ambulatory Visit (INDEPENDENT_AMBULATORY_CARE_PROVIDER_SITE_OTHER): Payer: Medicare Other | Admitting: Neurology

## 2014-09-05 VITALS — BP 134/78 | HR 65 | Ht 63.0 in | Wt 164.3 lb

## 2014-09-05 DIAGNOSIS — R413 Other amnesia: Secondary | ICD-10-CM | POA: Diagnosis not present

## 2014-09-05 DIAGNOSIS — F0391 Unspecified dementia with behavioral disturbance: Secondary | ICD-10-CM | POA: Diagnosis not present

## 2014-09-05 DIAGNOSIS — F03918 Unspecified dementia, unspecified severity, with other behavioral disturbance: Secondary | ICD-10-CM

## 2014-09-05 DIAGNOSIS — I638 Other cerebral infarction: Secondary | ICD-10-CM | POA: Diagnosis not present

## 2014-09-05 NOTE — Patient Instructions (Signed)
1.  MRI brain without contrast 2.  OK to use ibuprofen or tylenol for pain 3.  Encouraged to use [place] that can locate patients outside the home:  http://mobilealertsystems.com/ 323-324-6867 http://www.lifelinesys.com/content/lifeline-products/get-life-gosafe  3856007270 www.verizonwireless.com/sure/ 769-844-9828  Medial Alert systems that link to smart phones:  http://www.lifelinesys.com/content/lifeline-products/response-app https://www.stanton.info/ RecycleRoad.pl.aspx  [employer] GPS Tracker:  VoiceZap.is [phone]
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
## 2014-09-07 ENCOUNTER — Ambulatory Visit (INDEPENDENT_AMBULATORY_CARE_PROVIDER_SITE_OTHER): Payer: Medicare Other | Admitting: Psychiatry

## 2014-09-07 ENCOUNTER — Encounter (HOSPITAL_COMMUNITY): Payer: Self-pay | Admitting: Psychiatry

## 2014-09-07 VITALS — BP 134/74 | HR 92 | Ht 62.0 in | Wt 166.0 lb

## 2014-09-07 DIAGNOSIS — F09 Unspecified mental disorder due to known physiological condition: Secondary | ICD-10-CM | POA: Diagnosis not present

## 2014-09-07 DIAGNOSIS — F329 Major depressive disorder, single episode, unspecified: Secondary | ICD-10-CM | POA: Diagnosis not present

## 2014-09-07 DIAGNOSIS — F0630 Mood disorder due to known physiological condition, unspecified: Secondary | ICD-10-CM

## 2014-09-07 DIAGNOSIS — R413 Other amnesia: Secondary | ICD-10-CM | POA: Diagnosis not present

## 2014-09-07 MED ORDER — DULOXETINE HCL 60 MG PO CPEP: 60.0000 mg | ORAL_CAPSULE | Freq: Every day | ORAL | Status: DC

## 2014-09-07 MED ORDER — OLANZAPINE 7.5 MG PO TABS: 7.5000 mg | ORAL_TABLET | Freq: Every day | ORAL | Status: DC
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
## 2014-09-13 ENCOUNTER — Other Ambulatory Visit: Payer: Self-pay

## 2014-09-14 ENCOUNTER — Ambulatory Visit
Admission: RE | Admit: 2014-09-14 | Discharge: 2014-09-14 | Disposition: A | Discharge status: Home / Self Care | Payer: Medicare Other | Source: Ambulatory Visit | Attending: Neurology | Admitting: Neurology

## 2014-09-14 ENCOUNTER — Other Ambulatory Visit: Payer: Self-pay

## 2014-09-14 DIAGNOSIS — R413 Other amnesia: Secondary | ICD-10-CM

## 2014-09-14 DIAGNOSIS — F0391 Unspecified dementia with behavioral disturbance: Secondary | ICD-10-CM | POA: Diagnosis not present

## 2014-09-14 DIAGNOSIS — F03918 Unspecified dementia, unspecified severity, with other behavioral disturbance: Secondary | ICD-10-CM

## 2014-09-22 ENCOUNTER — Encounter: Payer: Self-pay | Admitting: *Deleted

## 2014-09-24 ENCOUNTER — Other Ambulatory Visit: Payer: Self-pay | Admitting: Internal Medicine

## 2014-09-25 ENCOUNTER — Emergency Department (HOSPITAL_COMMUNITY): Payer: Medicare Other

## 2014-09-25 ENCOUNTER — Emergency Department (HOSPITAL_COMMUNITY)
Admission: EM | Admit: 2014-09-25 | Discharge: 2014-09-25 | Disposition: A | Discharge status: Home / Self Care | Payer: Medicare Other | Attending: Emergency Medicine | Admitting: Emergency Medicine

## 2014-09-25 ENCOUNTER — Encounter (HOSPITAL_COMMUNITY): Payer: Self-pay | Admitting: Emergency Medicine

## 2014-09-25 DIAGNOSIS — Z8619 Personal history of other infectious and parasitic diseases: Secondary | ICD-10-CM | POA: Insufficient documentation

## 2014-09-25 DIAGNOSIS — R0902 Hypoxemia: Secondary | ICD-10-CM | POA: Diagnosis not present

## 2014-09-25 DIAGNOSIS — Z862 Personal history of diseases of the blood and blood-forming organs and certain disorders involving the immune mechanism: Secondary | ICD-10-CM | POA: Diagnosis not present

## 2014-09-25 DIAGNOSIS — R51 Headache: Secondary | ICD-10-CM | POA: Diagnosis not present

## 2014-09-25 DIAGNOSIS — I160 Hypertensive urgency: Secondary | ICD-10-CM

## 2014-09-25 DIAGNOSIS — F329 Major depressive disorder, single episode, unspecified: Secondary | ICD-10-CM | POA: Diagnosis not present

## 2014-09-25 DIAGNOSIS — Z8719 Personal history of other diseases of the digestive system: Secondary | ICD-10-CM | POA: Diagnosis not present

## 2014-09-25 DIAGNOSIS — Z9889 Other specified postprocedural states: Secondary | ICD-10-CM | POA: Diagnosis not present

## 2014-09-25 DIAGNOSIS — Z8639 Personal history of other endocrine, nutritional and metabolic disease: Secondary | ICD-10-CM | POA: Diagnosis not present

## 2014-09-25 DIAGNOSIS — Z79899 Other long term (current) drug therapy: Secondary | ICD-10-CM | POA: Insufficient documentation

## 2014-09-25 DIAGNOSIS — F419 Anxiety disorder, unspecified: Secondary | ICD-10-CM | POA: Insufficient documentation

## 2014-09-25 DIAGNOSIS — Z7982 Long term (current) use of aspirin: Secondary | ICD-10-CM | POA: Insufficient documentation

## 2014-09-25 DIAGNOSIS — I10 Essential (primary) hypertension: Secondary | ICD-10-CM | POA: Insufficient documentation

## 2014-09-25 DIAGNOSIS — R519 Headache, unspecified: Secondary | ICD-10-CM

## 2014-09-25 DIAGNOSIS — Z8739 Personal history of other diseases of the musculoskeletal system and connective tissue: Secondary | ICD-10-CM | POA: Insufficient documentation

## 2014-09-25 LAB — BASIC METABOLIC PANEL WITH GFR
Anion gap: 7 (ref 5–15)
BUN: 14 mg/dL (ref 6–20)
CO2: 26 mmol/L (ref 22–32)
Calcium: 9.3 mg/dL (ref 8.9–10.3)
Chloride: 107 mmol/L (ref 101–111)
Creatinine, Ser: 1.03 mg/dL — ABNORMAL HIGH (ref 0.44–1.00)
GFR calc Af Amer: 59 mL/min — ABNORMAL LOW
GFR calc non Af Amer: 51 mL/min — ABNORMAL LOW
Glucose, Bld: 131 mg/dL — ABNORMAL HIGH (ref 65–99)
Potassium: 3.7 mmol/L (ref 3.5–5.1)
Sodium: 140 mmol/L (ref 135–145)

## 2014-09-25 LAB — CBC WITH DIFFERENTIAL/PLATELET
Basophils Absolute: 0 10*3/uL (ref 0.0–0.1)
Basophils Relative: 0 % (ref 0–1)
Eosinophils Absolute: 0 10*3/uL (ref 0.0–0.7)
Eosinophils Relative: 1 % (ref 0–5)
HCT: 32.1 % — ABNORMAL LOW (ref 36.0–46.0)
Hemoglobin: 11.1 g/dL — ABNORMAL LOW (ref 12.0–15.0)
Lymphocytes Relative: 22 % (ref 12–46)
Lymphs Abs: 1 10*3/uL (ref 0.7–4.0)
MCH: 31.4 pg (ref 26.0–34.0)
MCHC: 34.6 g/dL (ref 30.0–36.0)
MCV: 90.7 fL (ref 78.0–100.0)
Monocytes Absolute: 0.4 10*3/uL (ref 0.1–1.0)
Monocytes Relative: 9 % (ref 3–12)
Neutro Abs: 3.1 10*3/uL (ref 1.7–7.7)
Neutrophils Relative %: 68 % (ref 43–77)
Platelets: 171 10*3/uL (ref 150–400)
RBC: 3.54 MIL/uL — ABNORMAL LOW (ref 3.87–5.11)
RDW: 12.8 % (ref 11.5–15.5)
WBC: 4.6 10*3/uL (ref 4.0–10.5)

## 2014-09-25 NOTE — ED Notes (Signed)
Patient transported to CT 

## 2014-09-25 NOTE — ED Notes (Signed)
Per EMS, patient is from home. She reports having a headache and decided to check bp every 5 minutes, with one reading over 017 systolic. Patient only speaks [place]. 20g present in right hand. bp 164/81, p 72, o2 sat 95% on room air.
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
## 2014-09-25 NOTE — ED Notes (Signed)
THis RN called the interpreter line to use a translator to explain CT procedure to pt.   Interpreter line informed this RN than no [place] interpreters were available.

## 2014-09-25 NOTE — ED Provider Notes (Signed)
CSN: [id number]     Arrival date & time 09/25/14  0327 History   First MD Initiated Contact with Patient 09/25/14 [phone]     Chief Complaint  Patient presents with  . Headache     (Consider location/radiation/quality/duration/timing/severity/associated sxs/prior Treatment) HPI Comments: Patient is a 80 year old female with history of anxiety, hypertension, thrombocytopenia. She presents for evaluation of elevated blood pressure. She states that she woke in the night feeling badly. She checked her blood pressure and it was elevated over 200. She also reports associated headache. She then called 911 and was brought here by EMS.  Patient is a 80 y.o. female presenting with headaches. The history is provided by the patient.  Headache Pain location:  Generalized Quality:  Dull Radiates to:  Does not radiate Timing:  Constant Progression:  Worsening Chronicity:  New Relieved by:  Nothing Worsened by:  Nothing Ineffective treatments:  None tried   Past Medical History  Diagnosis Date  . Anxiety   . Depression   . GERD (gastroesophageal reflux disease)   . Hyperlipidemia   . Hypertension   . Osteoporosis   . CVA (cerebral vascular accident)   . Thyroid cyst   . Cholelithiasis   . MR (mitral regurgitation)   . Memory loss   . Gout 2009  . Vitamin D deficiency   . H. pylori infection 2011  . Vaso-vagal reaction   . Thrombocytopenia   . Diverticulosis   . S/P cardiac cath 12/20/13 12/20/2013   Past Surgical History  Procedure Laterality Date  . Cholecystectomy  2010  . Lasik    . Cesarean section    . Left heart catheterization with coronary angiogram N/A 12/20/2013    Procedure: LEFT HEART CATHETERIZATION WITH CORONARY ANGIOGRAM;  Surgeon: Burnell Blanks, MD;  Location: [facility] CATH LAB;  Service: Cardiovascular;  Laterality: N/A;   Family History  Problem Relation Age of Onset  . Hypertension Other   . Stomach cancer Brother   . Ulcers Son   . Colon cancer Neg Hx   .  Lung disease Father     Deceased   Social History  Substance Use Topics  . Smoking status: Never Smoker   . Smokeless tobacco: Never Used  . Alcohol Use: No   OB History    No data available     Review of Systems  Neurological: Positive for headaches.  All other systems reviewed and are negative.     Allergies  Aricept; Coreg; and Morphine sulfate  Home Medications   Prior to Admission medications   Medication Sig Start Date End Date Taking? Authorizing Provider  amLODipine (NORVASC) 5 MG tablet TAKE ONE TABLET BY MOUTH EVERY DAY    Aleksei Plotnikov V, MD  aspirin EC 81 MG EC tablet Take 1 tablet (81 mg total) by mouth daily. 12/20/13   Isaiah Serge, NP  Cholecalciferol 1000 UNITS tablet Take 1,000 Units by mouth daily.     Historical Provider, MD  Cyanocobalamin (VITAMIN B-12) 1000 MCG SUBL Place 1 tablet under the tongue daily.      Historical Provider, MD  cyclopentolate (CYCLODRYL,CYCLOGYL) 2 % ophthalmic solution  03/23/14   Historical Provider, MD  donepezil (ARICEPT) 10 MG tablet TAKE ONE TABLET BY MOUTH AT BEDTIME 06/20/14   Aleksei Plotnikov V, MD  DULoxetine (CYMBALTA) 60 MG capsule Take 1 capsule (60 mg total) by mouth daily. 09/07/14 09/07/15  Kathlee Nations, MD  febuxostat (ULORIC) 40 MG tablet Take 1 tablet (40 mg total) by mouth  daily. 08/24/14   Hendricks Limes, MD  gabapentin (NEURONTIN) 300 MG capsule TAKE ONE CAPSULE BY MOUTH THREE TIMES DAILY 06/20/14   Aleksei Plotnikov V, MD  losartan (COZAAR) 100 MG tablet TAKE ONE TABLET BY MOUTH ONCE DAILY 02/14/14   Aleksei Plotnikov V, MD  NITROSTAT 0.4 MG SL tablet DISSOLVE ONE TABLET UNDER THE TONGUE EVERY 5 MINUTES AS NEEDED FOR CHEST PAIN.  DO NOT EXCEED A TOTAL OF 3 DOSES IN 15 MINUTES 10/04/13   Aleksei Plotnikov V, MD  OLANZapine (ZYPREXA) 7.5 MG tablet Take 1 tablet (7.5 mg total) by mouth at bedtime. 09/07/14   Kathlee Nations, MD   BP 137/67 mmHg  Pulse 60  Temp(Src) 97.5 F (36.4 C) (Oral)  Resp 15  Ht 5\' 8"   (1.727 m)  Wt 165 lb (74.844 kg)  BMI 25.09 kg/m2  SpO2 97% Physical Exam  Constitutional: She is oriented to person, place, and time. She appears well-developed and well-nourished. No distress.  HENT:  Head: Normocephalic and atraumatic.  Eyes: EOM are normal. Pupils are equal, round, and reactive to light.  Neck: Normal range of motion. Neck supple.  Cardiovascular: Normal rate and regular rhythm.  Exam reveals no gallop and no friction rub.   No murmur heard. Pulmonary/Chest: Effort normal and breath sounds normal. No respiratory distress. She has no wheezes.  Abdominal: Soft. Bowel sounds are normal. She exhibits no distension. There is no tenderness.  Musculoskeletal: Normal range of motion.  Neurological: She is alert and oriented to person, place, and time. No cranial nerve deficit. She exhibits normal muscle tone. Coordination normal.  Skin: Skin is warm and dry. She is not diaphoretic.  Nursing note and vitals reviewed.   ED Course  Procedures (including critical care time) Labs Review Labs Reviewed  CBC WITH DIFFERENTIAL/PLATELET  BASIC METABOLIC PANEL    Imaging Review No results found. I have personally reviewed and evaluated these images and lab results as part of my medical decision-making.   EKG Interpretation   Date/Time:  Sunday September 25 2014 03:46:11 EDT Ventricular Rate:  61 PR Interval:  183 QRS Duration: 91 QT Interval:  436 QTC Calculation: 439 R Axis:   20 Text Interpretation:  Sinus rhythm Ventricular premature complex Confirmed  by Damion Kant  MD, Gabryela Kimbrell ([id number]) on 09/25/2014 5:30:33 AM      MDM   Final diagnoses:  None    Workup reveals an unchanged EKG, negative head CT, and unremarkable laboratory studies. Her blood pressure has been acceptable while here in the ER and no where near what she reported at home. She is resting comfortably and I believe is appropriate for discharge.    Veryl Speak, MD 09/25/14 [phone]

## 2014-09-25 NOTE — Discharge Instructions (Signed)
??????????? (Hypertension) ???????????? (???????????) ?????? ???????? ??????? ???????????? ????????, ????? ??? ????????????? ????? ????? ??????? ????????? ??????? ??????? ????. ??????? - ??? ??????????? ??????, ?? ??????? ????? ?? ?????? ???????????????? ?? ????? ????. ????????? ??????????? ????????? ???????? ??????? ?? ???????? ? ???????? ?????, ????????, 110/72. ??????? ????? (?????????? ?????????????? ????????) - ??? ???????? ?????? ???????, ????? ?????? ?????????. ??????? ????? (?????????? ??????????????? ????????) - ??? ???????? ?????? ???????, ????? ?????? ???????????. ? ?????? ???? ?????????? ? ????, ????? ???????????? ???????? ?? ????????? ???????? 120/80. ?????????? ????????? ?????? ???????? ???????, ????? ???????????? ?????. ??????? ????? ??????????? ?????? ??? ????????. ??????? ??????????? ??????? ? ?????? ???????? ????????? ???????????, ???????? ? ?????? [????????-?????????? ?????????].  ??????? ????? ????????? ??????? ????? ????????? ???????? ????????????? ???????? ????? ??????????????. ?????? ?? ????????? ????????.  ????? ???????? ?????, ??????? ?? ?? ?????? ??????????????:   ??????? ??????????????. ????????, ????-?????????? ?????????? ????? ???????? ????? [???????? ???????????].  ???????. ? ????????? ???? ??????????.  ???. ? ???????? ?? 45 ??? ??????? ?????????? ????? ???????? ?????, ??? ???????. ? ???????? ????? 65 ??? ??????? ?????????? ????? ???????? ?????, ??? ???????. ????? ???????? ?????, ??????? ?? ?????? ??????????????:  ?????????? ??????????? ?????????? ???????? ??? ?????????? ??????????.  ????????.  ???????????? ? ???? ??????? ???????? ?????????? ?????, ??????, ??????? ??? ????.  ??????????????? ????????? ?????????. ???????? ? ???????? ??????????? ?????? ?? ?????????????? ??????-???? ?????????? ??? ??????????. ??????????? ??????? ???????? (??????????????? ????) ????? ?????????????? ???????? ?????, ?????????????, ??????? ? ????????????? ?? ????. ???????  ?????  ?????????, ??????? ?? ? ??? ??????????, ??????? ???? ??????? ???????????? ???????? ? ????????? ????, ????? ???? ???? (?? ??????? ???????? ????????) ????????? ?? ?????? ??????. ???????? ?????? ??????????, ?? ??????? ????, ??? ????, ??? ????????????? ????? ? ??? ?? ????. ????????? ????????? ????????????? ??????? ??????????? ????????????? ???????? ?? ?????? ? ????? ?????. ??????????? ??????????? ???????????, ?? ????????? ? ???????, ????????????? ???????? ?? ????????, ??? ??? ????????? ???????. ???? ???????? ???????? ???????, ????????? ?????? ????? ??? ??? ????????? ???. ???????  ??????? ????????? ??????????? ???????? ???????? ????????? ?????? ????? ?, ????????, ????? ????????. ??????? ????????? ?????? ????? ????????? ??????? ??????? ???????????? ????????. ????????, ??? ??????????? ???????? ????????? ???? ????????. ????????? ?????? ????? ????? ????????:  ?????????? ????? DASH. ??? ???? ????? ? ?????? ??????? ???????? ????? ???????, ???? ?? ??????? ?????? ? ?????. ????????????? ???? ? ?????? ??????????? ????, ???????? ???? ? ???????????? ??????.  ?? ??????? ????, 2 1/2 ???? ? ?????? ?????? ???????? ???????????? ?????????? ??????????.  ??? ????????????? ??????? ?????????? ?? ??????? ????.  ?? ??????.  ?????????? ???????????? ??????????? ????????.  ????????? ???????? ?????????? ?????????? ????????. ???? ????????? ?????? ????? ???????????? ??? ??????????????? ????????????? ????????, ??? ??????? ???? ????? ????????? ?????????. ????????, ??????????? ?? ???? [?????????]. ??? ????, ????? ?????? ????? ? ??????, ????????? ?????? ??????? ? ????? ??????? ??????. ?????????? ?? ????? ? ???????? ????????  ????????? ????????? ????????? ????????????? ????????, ??? ??????? ????? ??????.  ?????????? ????????????? ????????? ?????? ? ???????????? ? ?????????? ?????. ????????? ???????? ???? ???????????. ????????????? ????????? ?????? ???????????, ??? ??????????. ???????? ????? ?? ???? ???????? ???????, ???? ?? ??????  ?????????? ????? ??? ???. ??????? ???? ????? ???????? ???? ????????????? ??????????.  ?? ?????????????.  ??????? ?? ???????????? ?????? ????????????? ???????? ? ???????? ????????, ??? ???????? ????? ??????. ?????????? ? ?????, ????:   ?? ???????, ??? ? ??? ????????? ??????? ?? ??????????? ?????????.  ? ??? ?????????? ?????????????? ???????? ???? ??? ???????? ??????????????.  ? ??? ?????????? ??????????? ???????.  ? ??? ?????????? ??????. ?????????? ?????????? ? ?????, ????:  ? ??? ????????? ???????? ???? ??? ??????????? ????????.  ? ??? ?????????? ????????? ????????, ???????? ??? ???????? ??????????????.  ? ??? ????????? ??????? ???? ? ????? ??? ??????.  ? ??? ????????? ????????? ?????.  ??? ?????? ??????. ?????????, ??? ??:   ????????? ?????? ??????????.  ?????? ?????????????? ??????? ?? ????? ??????????.  ??????????????? ?????????? ? ?????, ???? ??? ?? ?????????? ????? ??? ?????????? ????.  Document Released: 12/31/2004 Document Revised: 05/17/2013 [facility] Patient Information 2015 [place], [place]. This information is not intended to replace advice given to you by your health care provider. Make sure you discuss any questions you have with your health care provider.

## 2014-09-26 ENCOUNTER — Other Ambulatory Visit: Payer: Self-pay

## 2014-09-26 MED ORDER — LOSARTAN POTASSIUM 100 MG PO TABS: 100.0000 mg | ORAL_TABLET | Freq: Every day | ORAL | Status: DC

## 2014-10-04 DIAGNOSIS — H26491 Other secondary cataract, right eye: Secondary | ICD-10-CM | POA: Diagnosis not present

## 2014-10-10 ENCOUNTER — Other Ambulatory Visit: Payer: Self-pay | Admitting: Internal Medicine

## 2014-10-20 DIAGNOSIS — H26492 Other secondary cataract, left eye: Secondary | ICD-10-CM | POA: Diagnosis not present

## 2014-11-07 ENCOUNTER — Ambulatory Visit (HOSPITAL_COMMUNITY): Payer: Self-pay | Admitting: Psychiatry

## 2014-11-10 ENCOUNTER — Other Ambulatory Visit (HOSPITAL_COMMUNITY): Payer: Self-pay | Admitting: Psychiatry

## 2014-11-11 ENCOUNTER — Other Ambulatory Visit (HOSPITAL_COMMUNITY): Payer: Self-pay | Admitting: Psychiatry

## 2014-11-11 DIAGNOSIS — F0630 Mood disorder due to known physiological condition, unspecified: Secondary | ICD-10-CM

## 2014-11-11 MED ORDER — OLANZAPINE 7.5 MG PO TABS: 7.5000 mg | ORAL_TABLET | Freq: Every day | ORAL | Status: DC

## 2014-11-15 ENCOUNTER — Ambulatory Visit (INDEPENDENT_AMBULATORY_CARE_PROVIDER_SITE_OTHER): Payer: Medicare Other | Admitting: Internal Medicine

## 2014-11-15 ENCOUNTER — Encounter: Payer: Self-pay | Admitting: Internal Medicine

## 2014-11-15 VITALS — BP 140/84 | HR 73 | Wt 171.0 lb

## 2014-11-15 DIAGNOSIS — E785 Hyperlipidemia, unspecified: Secondary | ICD-10-CM | POA: Diagnosis not present

## 2014-11-15 DIAGNOSIS — Z9114 Patient's other noncompliance with medication regimen: Secondary | ICD-10-CM | POA: Diagnosis not present

## 2014-11-15 DIAGNOSIS — R413 Other amnesia: Secondary | ICD-10-CM | POA: Diagnosis not present

## 2014-11-15 DIAGNOSIS — F418 Other specified anxiety disorders: Secondary | ICD-10-CM

## 2014-11-15 DIAGNOSIS — I10 Essential (primary) hypertension: Secondary | ICD-10-CM

## 2014-11-15 NOTE — Assessment & Plan Note (Signed)
Chronic dementia Discussed w/dtr

## 2014-11-15 NOTE — Assessment & Plan Note (Signed)
On Zyprexa Risks associated with treatment noncompliance were discussed. Compliance was encouraged.  Potential benefits of a long term Zyprexa use as well as potential risks  and complications were explained to the patient and were aknowledged.

## 2014-11-15 NOTE — Assessment & Plan Note (Signed)
Declined

## 2014-11-15 NOTE — Assessment & Plan Note (Signed)
Chronic dementia

## 2014-11-15 NOTE — Progress Notes (Signed)
Subjective:  Patient ID: Alice Wong, female    DOB: 12/22/1934  Age: 80 y.o. MRN: [id number]  CC: No chief complaint on file.   HPI BERLINE SEMRAD presents for dementia, anxiety, paranoid ideation, HAs, HTN f/u. She is non-compliant w/Rx  Outpatient Prescriptions Prior to Visit  Medication Sig Dispense Refill  . amLODipine (NORVASC) 5 MG tablet TAKE ONE TABLET BY MOUTH ONCE DAILY 90 tablet 3  . aspirin EC 81 MG EC tablet Take 1 tablet (81 mg total) by mouth daily.    . Cholecalciferol 1000 UNITS tablet Take 1,000 Units by mouth daily.     . Cyanocobalamin (VITAMIN B-12) 1000 MCG SUBL Place 1 tablet under the tongue daily.      Marland Kitchen donepezil (ARICEPT) 10 MG tablet TAKE ONE TABLET BY MOUTH AT BEDTIME 90 tablet 3  . DULoxetine (CYMBALTA) 60 MG capsule Take 1 capsule (60 mg total) by mouth daily. 30 capsule 1  . gabapentin (NEURONTIN) 300 MG capsule TAKE ONE CAPSULE BY MOUTH THREE TIMES DAILY 90 capsule 11  . losartan (COZAAR) 100 MG tablet Take 1 tablet (100 mg total) by mouth daily. 90 tablet 3  . NITROSTAT 0.4 MG SL tablet DISSOLVE ONE TABLET UNDER THE TONGUE EVERY 5 MINUTES AS NEEDED FOR CHEST PAIN.  DO NOT EXCEED A TOTAL OF 3 DOSES IN 15 MINUTES 25 tablet 0  . OLANZapine (ZYPREXA) 7.5 MG tablet Take 1 tablet (7.5 mg total) by mouth at bedtime. 30 tablet 0  . cyclopentolate (CYCLODRYL,CYCLOGYL) 2 % ophthalmic solution     . febuxostat (ULORIC) 40 MG tablet Take 1 tablet (40 mg total) by mouth daily. (Patient not taking: Reported on 11/15/2014) 30 tablet 2   No facility-administered medications prior to visit.    ROS Review of Systems  Constitutional: Negative for chills, activity change, appetite change, fatigue and unexpected weight change.  HENT: Negative for congestion, mouth sores and sinus pressure.   Eyes: Negative for visual disturbance.  Respiratory: Negative for cough and chest tightness.   Gastrointestinal: Negative for nausea and abdominal pain.  Genitourinary:  Negative for frequency, difficulty urinating and vaginal pain.  Musculoskeletal: Negative for back pain and gait problem.  Skin: Negative for pallor and rash.  Neurological: Negative for dizziness, tremors, weakness, numbness and headaches.  Psychiatric/Behavioral: Positive for behavioral problems and decreased concentration. Negative for suicidal ideas, confusion, sleep disturbance and dysphoric mood. The patient is nervous/anxious.     Objective:  BP 140/84 mmHg  Pulse 73  Wt 171 lb (77.565 kg)  SpO2 95%  BP Readings from Last 3 Encounters:  11/15/14 140/84  09/25/14 172/87  09/07/14 134/74    Wt Readings from Last 3 Encounters:  11/15/14 171 lb (77.565 kg)  09/25/14 165 lb (74.844 kg)  09/07/14 166 lb (75.297 kg)    Physical Exam  Constitutional: She appears well-developed. No distress.  HENT:  Head: Normocephalic.  Right Ear: External ear normal.  Left Ear: External ear normal.  Nose: Nose normal.  Mouth/Throat: Oropharynx is clear and moist.  Eyes: Conjunctivae are normal. Pupils are equal, round, and reactive to light. Right eye exhibits no discharge. Left eye exhibits no discharge.  Neck: Normal range of motion. Neck supple. No JVD present. No tracheal deviation present. No thyromegaly present.  Cardiovascular: Normal rate, regular rhythm and normal heart sounds.   Pulmonary/Chest: No stridor. No respiratory distress. She has no wheezes.  Abdominal: Soft. Bowel sounds are normal. She exhibits no distension and no mass. There is no tenderness.  There is no rebound and no guarding.  Musculoskeletal: She exhibits no edema or tenderness.  Lymphadenopathy:    She has no cervical adenopathy.  Neurological: She displays normal reflexes. No cranial nerve deficit. She exhibits normal muscle tone. Coordination normal.  Skin: No rash noted. No erythema.  Psychiatric: Her behavior is normal. Judgment normal.    Lab Results  Component Value Date   WBC 4.6 09/25/2014   HGB  11.1* 09/25/2014   HCT 32.1* 09/25/2014   PLT 171 09/25/2014   GLUCOSE 131* 09/25/2014   CHOL 166 08/23/2011   TRIG 115.0 08/23/2011   HDL 36.90* 08/23/2011   LDLCALC 106* 08/23/2011   ALT 21 08/23/2014   AST 20 08/23/2014   NA 140 09/25/2014   K 3.7 09/25/2014   CL 107 09/25/2014   CREATININE 1.03* 09/25/2014   BUN 14 09/25/2014   CO2 26 09/25/2014   TSH 3.46 04/13/2014   INR 1.05 12/17/2013   HGBA1C 6.1 08/16/2014    Ct Head Wo Contrast  09/25/2014  CLINICAL DATA:  Headache.  Elevated blood pressure. EXAM: CT HEAD WITHOUT CONTRAST TECHNIQUE: Contiguous axial images were obtained from the base of the skull through the vertex without intravenous contrast. COMPARISON:  MRI brain 09/14/2014.  CT head 03/29/2014. FINDINGS: Mild cerebral atrophy. Patchy low-attenuation changes in the deep white matter consistent small vessel ischemia. No mass effect or midline shift. No abnormal extra-axial fluid collections. Gray-white matter junctions are distinct. Basal cisterns are not effaced. No evidence of acute intracranial hemorrhage. No depressed skull fractures. Complete opacification of the left maxillary antrum. Remaining paranasal sinuses and mastoid air cells are not opacified. IMPRESSION: No acute intracranial abnormalities. Mild chronic atrophy and small vessel ischemic changes. Opacification of the left maxillary antrum. Electronically Signed   By: Lucienne Capers M.D.   On: 09/25/2014 06:15    Assessment & Plan:   There are no diagnoses linked to this encounter. I am having Ms. Blau maintain her Vitamin B-12, Cholecalciferol, NITROSTAT, aspirin, cyclopentolate, gabapentin, donepezil, febuxostat, DULoxetine, losartan, amLODipine, and OLANZapine.  No orders of the defined types were placed in this encounter.     Follow-up: No Follow-up on file.  Walker Kehr, MD

## 2014-11-15 NOTE — Progress Notes (Signed)
Pre visit review using our clinic review tool, if applicable. No additional management support is needed unless otherwise documented below in the visit note. 

## 2014-11-15 NOTE — Assessment & Plan Note (Signed)
Losartan, amlodipine 

## 2014-11-17 ENCOUNTER — Ambulatory Visit (INDEPENDENT_AMBULATORY_CARE_PROVIDER_SITE_OTHER): Payer: Medicare Other | Admitting: Psychiatry

## 2014-11-17 ENCOUNTER — Encounter (HOSPITAL_COMMUNITY): Payer: Self-pay | Admitting: Psychiatry

## 2014-11-17 VITALS — BP 129/86 | HR 86 | Ht 64.5 in | Wt 171.0 lb

## 2014-11-17 DIAGNOSIS — F0630 Mood disorder due to known physiological condition, unspecified: Secondary | ICD-10-CM | POA: Diagnosis not present

## 2014-11-17 DIAGNOSIS — F329 Major depressive disorder, single episode, unspecified: Secondary | ICD-10-CM

## 2014-11-17 MED ORDER — OLANZAPINE 7.5 MG PO TABS: 7.5000 mg | ORAL_TABLET | Freq: Every day | ORAL | Status: DC

## 2014-11-17 MED ORDER — DULOXETINE HCL 60 MG PO CPEP: 60.0000 mg | ORAL_CAPSULE | Freq: Every day | ORAL | Status: DC

## 2014-11-17 NOTE — Progress Notes (Signed)
[facility] progress Note  Alice Wong [id number] 80 y.o.  11/17/2014 3:22 PM  Chief Complaint:  I have headaches.    History of Present Illness:  Adiya came for her follow-up appointment with the translator and with her daughter.  Her daughter is concerned because patient is noncompliant with her Zyprexa.  Slowly she's been decompensating and noticed more irritable, she is poor sleep and he started to have arguments with the daughter.  When I asked why she is not taking medication she got upset and mentioned that sometimes she does not remember.  She also mentioned lately having headaches and feeling tired.  Patient endorse multiple somatic complaints including neuropathy, chronic pain, feeling tired, lack of energy however there has been no recent episodes of aggression or violent behavior.  She continues to accuse her daughter for various thing but she is not aggressive or impulsive.  Daughter endorse that she continues to have paranoia but patient did not elaborate further.  She minimizes her symptoms.  She promised that if someone reminds her and she will take the medication at bedtime.  She is taking Cymbalta as prescribed.  Daughter endorse mostly she had issues with night medications.  She sleeping 5-6 hours.  She denies any crying spells.  She denies any suicidal thoughts or homicidal thought.  Recently she's seen her primary care physician and she has blood work which shows RBC count 3.54 and hematocrit 32.1.  Her hemoglobin is 11.1.  Her creatinine was 1.03, BUN 14 and glucose 131.  She also has CT scan of head which shows mild atrophy.  Her appetite is okay.  Her vitals are stable.   Suicidal Ideation: No Plan Formed: No Patient has means to carry out plan: No  Homicidal Ideation: No Plan Formed: No Patient has means to carry out plan: No  Past Psychiatric History/Hospitalization(s): Patient is taking Prozac from primary care physician for past 7-8 years for  depression and anxiety symptoms.  She do not recall any inpatient psychiatric treatment or any suicidal attempt.  She admitted irritability, anger and mood swings but symptoms started to get worse in past few months.  Patient denies any history of psychosis.  We tried Depakote 500 mg but patient did not see any improvement with the medication. Anxiety: Yes Bipolar Disorder: No Depression: Yes Mania: No Psychosis: No Schizophrenia: No Personality Disorder: No Hospitalization for psychiatric illness: No History of Electroconvulsive Shock Therapy: No Prior Suicide Attempts: No  Medical History; Patient has history of GERD, hyperlipidemia, hypertension, osteoporosis, cerebrovascular accident, cholelithiasis, moderate regurgitation, homicidal pia, memory impairment, arthritis, gout, vitamin D deficiency and neuropathy.  She also endorse history of syncopal episodes when she was in [place].  She remember having extensive neurology workup.  Her primary care physician is Dr Johnanna Schneiders.  Review of Systems: Psychiatric: Agitation: Yes Hallucination: No Depressed Mood: Yes Insomnia: Yes Hypersomnia: No Altered Concentration: Yes Feels Worthless: No Grandiose Ideas: No Belief In Special Powers: No New/Increased Substance Abuse: No Compulsions: No  Neurologic: Headache: Yes Seizure: No Paresthesias: Yes   Outpatient Encounter Prescriptions as of 11/17/2014  Medication Sig  . amLODipine (NORVASC) 5 MG tablet TAKE ONE TABLET BY MOUTH ONCE DAILY  . aspirin EC 81 MG EC tablet Take 1 tablet (81 mg total) by mouth daily.  . Cholecalciferol 1000 UNITS tablet Take 1,000 Units by mouth daily.   . Cyanocobalamin (VITAMIN B-12) 1000 MCG SUBL Place 1 tablet under the tongue daily.    . cyclopentolate (CYCLODRYL,CYCLOGYL) 2 %  ophthalmic solution   . donepezil (ARICEPT) 10 MG tablet TAKE ONE TABLET BY MOUTH AT BEDTIME  . DULoxetine (CYMBALTA) 60 MG capsule Take 1 capsule (60 mg total) by mouth daily.   . febuxostat (ULORIC) 40 MG tablet Take 1 tablet (40 mg total) by mouth daily. (Patient not taking: Reported on 11/15/2014)  . gabapentin (NEURONTIN) 300 MG capsule TAKE ONE CAPSULE BY MOUTH THREE TIMES DAILY  . losartan (COZAAR) 100 MG tablet Take 1 tablet (100 mg total) by mouth daily.  Marland Kitchen NITROSTAT 0.4 MG SL tablet DISSOLVE ONE TABLET UNDER THE TONGUE EVERY 5 MINUTES AS NEEDED FOR CHEST PAIN.  DO NOT EXCEED A TOTAL OF 3 DOSES IN 15 MINUTES  . OLANZapine (ZYPREXA) 7.5 MG tablet Take 1 tablet (7.5 mg total) by mouth at bedtime.  . [DISCONTINUED] DULoxetine (CYMBALTA) 60 MG capsule Take 1 capsule (60 mg total) by mouth daily.  . [DISCONTINUED] OLANZapine (ZYPREXA) 7.5 MG tablet Take 1 tablet (7.5 mg total) by mouth at bedtime.   No facility-administered encounter medications on file as of 11/17/2014.    Recent Results (from the past 2160 hour(s))  Comprehensive metabolic panel     Status: Abnormal   Collection Time: 08/23/14  8:40 PM  Result Value Ref Range   Sodium 140 135 - 146 mmol/L   Potassium 4.6 3.5 - 5.3 mmol/L   Chloride 103 98 - 110 mmol/L   CO2 24 20 - 31 mmol/L   Glucose, Bld 226 (H) 65 - 99 mg/dL   BUN 24 7 - 25 mg/dL   Creat 1.14 (H) 0.60 - 0.93 mg/dL   Total Bilirubin 0.4 0.2 - 1.2 mg/dL   Alkaline Phosphatase 62 33 - 130 U/L   AST 20 10 - 35 U/L   ALT 21 6 - 29 U/L   Total Protein 6.6 6.1 - 8.1 g/dL   Albumin 4.1 3.6 - 5.1 g/dL   Calcium 8.9 8.6 - 10.4 mg/dL    Comment: ** Please note change in unit of measure and reference range(s). **     POCT CBC     Status: Abnormal   Collection Time: 08/23/14  8:54 PM  Result Value Ref Range   WBC 10.3 (A) 4.6 - 10.2 K/uL   Lymph, poc 0.9 0.6 - 3.4   POC LYMPH PERCENT 8.6 (A) 10 - 50 %L   MID (cbc) 0.5 0 - 0.9   POC MID % 4.5 0 - 12 %M   POC Granulocyte 9.0 (A) 2 - 6.9   Granulocyte percent 86.9 (A) 37 - 80 %G   RBC 4.11 4.04 - 5.48 M/uL   Hemoglobin 12.5 12.2 - 16.2 g/dL   HCT, POC 36.9 (A) 37.7 - 47.9 %   MCV 89.8 80  - 97 fL   MCH, POC 30.3 27 - 31.2 pg   MCHC 33.8 31.8 - 35.4 g/dL   RDW, POC 12.8 %   Platelet Count, POC 262 142 - 424 K/uL   MPV 7.1 0 - 99.8 fL  POCT glucose (manual entry)     Status: Abnormal   Collection Time: 08/23/14  8:54 PM  Result Value Ref Range   POC Glucose 234 (A) 70 - 99 mg/dl  CBC with Differential     Status: Abnormal   Collection Time: 09/25/14  5:38 AM  Result Value Ref Range   WBC 4.6 4.0 - 10.5 K/uL   RBC 3.54 (L) 3.87 - 5.11 MIL/uL   Hemoglobin 11.1 (L) 12.0 - 15.0 g/dL  HCT 32.1 (L) 36.0 - 46.0 %   MCV 90.7 78.0 - 100.0 fL   MCH 31.4 26.0 - 34.0 pg   MCHC 34.6 30.0 - 36.0 g/dL   RDW 12.8 11.5 - 15.5 %   Platelets 171 150 - 400 K/uL   Neutrophils Relative % 68 43 - 77 %   Neutro Abs 3.1 1.7 - 7.7 K/uL   Lymphocytes Relative 22 12 - 46 %   Lymphs Abs 1.0 0.7 - 4.0 K/uL   Monocytes Relative 9 3 - 12 %   Monocytes Absolute 0.4 0.1 - 1.0 K/uL   Eosinophils Relative 1 0 - 5 %   Eosinophils Absolute 0.0 0.0 - 0.7 K/uL   Basophils Relative 0 0 - 1 %   Basophils Absolute 0.0 0.0 - 0.1 K/uL  Basic metabolic panel     Status: Abnormal   Collection Time: 09/25/14  5:38 AM  Result Value Ref Range   Sodium 140 135 - 145 mmol/L   Potassium 3.7 3.5 - 5.1 mmol/L   Chloride 107 101 - 111 mmol/L   CO2 26 22 - 32 mmol/L   Glucose, Bld 131 (H) 65 - 99 mg/dL   BUN 14 6 - 20 mg/dL   Creatinine, Ser 1.03 (H) 0.44 - 1.00 mg/dL   Calcium 9.3 8.9 - 10.3 mg/dL   GFR calc non Af Amer 51 (L) >60 mL/min   GFR calc Af Amer 59 (L) >60 mL/min    Comment: (NOTE) The eGFR has been calculated using the CKD EPI equation. This calculation has not been validated in all clinical situations. eGFR's persistently <60 mL/min signify possible Chronic Kidney Disease.    Anion gap 7 5 - 15      Constitutional:  BP 129/86 mmHg  Pulse 86  Ht 5' 4.5" (1.638 m)  Wt 171 lb (77.565 kg)  BMI 28.91 kg/m2   Musculoskeletal: Strength & Muscle Tone: decreased Gait & Station:  normal Patient leans: N/A  Psychiatric Specialty Exam: General Appearance: Casual  Eye Contact::  Fair  Speech:  NA  Volume:  Normal  Mood:  Anxious and Irritable  Affect:  Appropriate  Thought Process:  Circumstantial  Orientation:  Full (Time, Place, and Person)  Thought Content:  WDL  Suicidal Thoughts:  No  Homicidal Thoughts:  No  Memory:  Immediate;   Fair Recent;   Fair Remote;   Fair  Judgement:  Fair  Insight:  Fair  Psychomotor Activity:  Increased  Concentration:  Poor  Recall:  [employer] of Knowledge:  Poor  Language:  Poor  Akathisia:  No  Handed:  Right  AIMS (if indicated):     Assets:  Desire for Improvement Financial Resources/Insurance Housing Social Support  ADL's:  Intact  Cognition:  Impaired,  Mild  Sleep:        Established Problem, Stable/Improving (1), Review of Psycho-Social Stressors (1), Review or order clinical lab tests (1), Review and summation of old records (2), Review of Last Therapy Session (1), Review of Medication Regimen & Side Effects (2) and Review of New Medication or Change in Dosage (2)  Assessment: Axis I: Mood disorder due to general medical condition.  Depression due to memory impairment and other medical illnesses.  Depressive disorder NOS, cognitive impairment NOS  Axis II: Deferred  Axis III:  Past Medical History  Diagnosis Date  . Anxiety   . Depression   . GERD (gastroesophageal reflux disease)   . Hyperlipidemia   . Hypertension   .  Osteoporosis   . CVA (cerebral vascular accident) ([place])   . Thyroid cyst   . Cholelithiasis   . MR (mitral regurgitation)   . Memory loss   . Gout 2009  . Vitamin D deficiency   . H. pylori infection 2011  . Vaso-vagal reaction   . Thrombocytopenia ([place])   . Diverticulosis   . S/P cardiac cath 12/20/13 12/20/2013     Plan:  I review her symptoms, recent blood work results, psychosocial stressors and her current medication.  Her CT scan of the head shows mild chronic  atrophy and small suicidal ischemic changes.  She had not been taking her Zyprexa as prescribed.  Discussed risk of noncompliance with medication.  Patient promised that she will take night medication if she received phone call from her daughter as a reminder.  Today her translator also promised that she will call her every night to remind her to take the medication.  Continue Cymbalta 60 mg daily.  At this time does not need to change the dose of Zyprexa.  She will continue Zyprexa 7.5 mg at bedtime.  One more time discussed medication side effects and benefits.  Recommended to call us back if she has any question or any concern.  Follow-up in 3 months. Discuss safety risk the patient with the help of translator that anytime having active suicidal thoughts or homicidal thoughts and she need to call 911 or go to local emergency room.   Zain Bingman T., MD 11/17/2014

## 2014-12-16 DIAGNOSIS — H2000 Unspecified acute and subacute iridocyclitis: Secondary | ICD-10-CM | POA: Diagnosis not present

## 2014-12-23 DIAGNOSIS — H2000 Unspecified acute and subacute iridocyclitis: Secondary | ICD-10-CM | POA: Diagnosis not present

## 2015-01-19 DIAGNOSIS — H2000 Unspecified acute and subacute iridocyclitis: Secondary | ICD-10-CM | POA: Diagnosis not present

## 2015-02-15 ENCOUNTER — Encounter: Payer: Self-pay | Admitting: Internal Medicine

## 2015-02-15 ENCOUNTER — Ambulatory Visit (INDEPENDENT_AMBULATORY_CARE_PROVIDER_SITE_OTHER): Payer: Medicare Other | Admitting: Internal Medicine

## 2015-02-15 ENCOUNTER — Other Ambulatory Visit: Payer: Medicare Other

## 2015-02-15 ENCOUNTER — Ambulatory Visit (INDEPENDENT_AMBULATORY_CARE_PROVIDER_SITE_OTHER)
Admission: RE | Admit: 2015-02-15 | Discharge: 2015-02-15 | Disposition: A | Discharge status: Home / Self Care | Payer: Medicare Other | Source: Ambulatory Visit | Attending: Internal Medicine | Admitting: Internal Medicine

## 2015-02-15 VITALS — BP 130/80 | HR 70 | Wt 172.0 lb

## 2015-02-15 DIAGNOSIS — F419 Anxiety disorder, unspecified: Secondary | ICD-10-CM | POA: Diagnosis not present

## 2015-02-15 DIAGNOSIS — H578 Other specified disorders of eye and adnexa: Secondary | ICD-10-CM

## 2015-02-15 DIAGNOSIS — I10 Essential (primary) hypertension: Secondary | ICD-10-CM

## 2015-02-15 DIAGNOSIS — H5789 Other specified disorders of eye and adnexa: Secondary | ICD-10-CM

## 2015-02-15 DIAGNOSIS — F411 Generalized anxiety disorder: Secondary | ICD-10-CM

## 2015-02-15 DIAGNOSIS — R413 Other amnesia: Secondary | ICD-10-CM

## 2015-02-15 DIAGNOSIS — R05 Cough: Secondary | ICD-10-CM | POA: Diagnosis not present

## 2015-02-15 DIAGNOSIS — F0392 Unspecified dementia, unspecified severity, with psychotic disturbance: Secondary | ICD-10-CM

## 2015-02-15 DIAGNOSIS — R059 Cough, unspecified: Secondary | ICD-10-CM

## 2015-02-15 DIAGNOSIS — M2550 Pain in unspecified joint: Secondary | ICD-10-CM

## 2015-02-15 DIAGNOSIS — M545 Low back pain, unspecified: Secondary | ICD-10-CM

## 2015-02-15 DIAGNOSIS — J9811 Atelectasis: Secondary | ICD-10-CM | POA: Diagnosis not present

## 2015-02-15 DIAGNOSIS — F0391 Unspecified dementia with behavioral disturbance: Secondary | ICD-10-CM

## 2015-02-15 DIAGNOSIS — G8929 Other chronic pain: Secondary | ICD-10-CM

## 2015-02-15 MED ORDER — NITROGLYCERIN 0.4 MG SL SUBL: 0.4000 mg | SUBLINGUAL_TABLET | SUBLINGUAL | Status: DC | PRN

## 2015-02-15 NOTE — Assessment & Plan Note (Signed)
2016 Dr Quentin Ore - recurrent Labs CXR

## 2015-02-15 NOTE — Assessment & Plan Note (Signed)
Chronic dementia Aricept 

## 2015-02-15 NOTE — Assessment & Plan Note (Signed)
  Duloxetine She has some degree of a conversion disorder

## 2015-02-15 NOTE — Progress Notes (Signed)
Subjective:  Patient ID: Alice Wong, female    DOB: Nov 06, 1934  Age: 81 y.o. MRN: [id number]  CC: No chief complaint on file.   HPI Alice Wong presents for HTN, depression, dementia. C/o eye inflammation - needs tests per Dr Kathlen Mody  Outpatient Prescriptions Prior to Visit  Medication Sig Dispense Refill  . amLODipine (NORVASC) 5 MG tablet TAKE ONE TABLET BY MOUTH ONCE DAILY 90 tablet 3  . aspirin EC 81 MG EC tablet Take 1 tablet (81 mg total) by mouth daily.    . Cholecalciferol 1000 UNITS tablet Take 1,000 Units by mouth daily.     . Cyanocobalamin (VITAMIN B-12) 1000 MCG SUBL Place 1 tablet under the tongue daily.      Marland Kitchen donepezil (ARICEPT) 10 MG tablet TAKE ONE TABLET BY MOUTH AT BEDTIME 90 tablet 3  . DULoxetine (CYMBALTA) 60 MG capsule Take 1 capsule (60 mg total) by mouth daily. 30 capsule 2  . gabapentin (NEURONTIN) 300 MG capsule TAKE ONE CAPSULE BY MOUTH THREE TIMES DAILY 90 capsule 11  . losartan (COZAAR) 100 MG tablet Take 1 tablet (100 mg total) by mouth daily. 90 tablet 3  . NITROSTAT 0.4 MG SL tablet DISSOLVE ONE TABLET UNDER THE TONGUE EVERY 5 MINUTES AS NEEDED FOR CHEST PAIN.  DO NOT EXCEED A TOTAL OF 3 DOSES IN 15 MINUTES 25 tablet 0  . OLANZapine (ZYPREXA) 7.5 MG tablet Take 1 tablet (7.5 mg total) by mouth at bedtime. 30 tablet 2  . cyclopentolate (CYCLODRYL,CYCLOGYL) 2 % ophthalmic solution Reported on 02/15/2015    . febuxostat (ULORIC) 40 MG tablet Take 1 tablet (40 mg total) by mouth daily. (Patient not taking: Reported on 02/15/2015) 30 tablet 2   No facility-administered medications prior to visit.    ROS Review of Systems  Constitutional: Positive for fatigue. Negative for chills, activity change, appetite change and unexpected weight change.  HENT: Negative for congestion, mouth sores and sinus pressure.   Eyes: Positive for redness. Negative for visual disturbance.  Respiratory: Positive for cough. Negative for chest tightness.   Gastrointestinal:  Negative for nausea and abdominal pain.  Genitourinary: Negative for frequency, difficulty urinating and vaginal pain.  Musculoskeletal: Positive for back pain and arthralgias. Negative for gait problem.  Skin: Negative for pallor, rash and wound.  Neurological: Negative for dizziness, tremors, weakness, numbness and headaches.  Hematological: Does not bruise/bleed easily.  Psychiatric/Behavioral: Positive for behavioral problems, sleep disturbance and decreased concentration. Negative for suicidal ideas, confusion and agitation. The patient is nervous/anxious.     Objective:  BP 130/80 mmHg  Pulse 70  Wt 172 lb (78.019 kg)  SpO2 94%  BP Readings from Last 3 Encounters:  02/15/15 130/80  11/17/14 129/86  11/15/14 140/84    Wt Readings from Last 3 Encounters:  02/15/15 172 lb (78.019 kg)  11/17/14 171 lb (77.565 kg)  11/15/14 171 lb (77.565 kg)    Physical Exam  Constitutional: She appears well-developed. No distress.  HENT:  Head: Normocephalic.  Right Ear: External ear normal.  Left Ear: External ear normal.  Nose: Nose normal.  Mouth/Throat: Oropharynx is clear and moist.  Eyes: Conjunctivae are normal. Pupils are equal, round, and reactive to light. Right eye exhibits no discharge. Left eye exhibits no discharge.  Neck: Normal range of motion. Neck supple. No JVD present. No tracheal deviation present. No thyromegaly present.  Cardiovascular: Normal rate, regular rhythm and normal heart sounds.   Pulmonary/Chest: No stridor. No respiratory distress. She has no wheezes.  Abdominal: Soft. Bowel sounds are normal. She exhibits no distension and no mass. There is no tenderness. There is no rebound and no guarding.  Musculoskeletal: She exhibits no edema or tenderness.  Lymphadenopathy:    She has no cervical adenopathy.  Neurological: She displays normal reflexes. No cranial nerve deficit. She exhibits normal muscle tone. Coordination abnormal.  Skin: No rash noted. No  erythema.  Psychiatric: She has a normal mood and affect. Her behavior is normal. Judgment and thought content normal.  smells urine Ataxic Alert, cooperative  Lab Results  Component Value Date   WBC 4.6 09/25/2014   HGB 11.1* 09/25/2014   HCT 32.1* 09/25/2014   PLT 171 09/25/2014   GLUCOSE 131* 09/25/2014   CHOL 166 08/23/2011   TRIG 115.0 08/23/2011   HDL 36.90* 08/23/2011   LDLCALC 106* 08/23/2011   ALT 21 08/23/2014   AST 20 08/23/2014   NA 140 09/25/2014   K 3.7 09/25/2014   CL 107 09/25/2014   CREATININE 1.03* 09/25/2014   BUN 14 09/25/2014   CO2 26 09/25/2014   TSH 3.46 04/13/2014   INR 1.05 12/17/2013   HGBA1C 6.1 08/16/2014    Ct Head Wo Contrast  09/25/2014  CLINICAL DATA:  Headache.  Elevated blood pressure. EXAM: CT HEAD WITHOUT CONTRAST TECHNIQUE: Contiguous axial images were obtained from the base of the skull through the vertex without intravenous contrast. COMPARISON:  MRI brain 09/14/2014.  CT head 03/29/2014. FINDINGS: Mild cerebral atrophy. Patchy low-attenuation changes in the deep white matter consistent small vessel ischemia. No mass effect or midline shift. No abnormal extra-axial fluid collections. Gray-white matter junctions are distinct. Basal cisterns are not effaced. No evidence of acute intracranial hemorrhage. No depressed skull fractures. Complete opacification of the left maxillary antrum. Remaining paranasal sinuses and mastoid air cells are not opacified. IMPRESSION: No acute intracranial abnormalities. Mild chronic atrophy and small vessel ischemic changes. Opacification of the left maxillary antrum. Electronically Signed   By: Lucienne Capers M.D.   On: 09/25/2014 06:15    Assessment & Plan:   There are no diagnoses linked to this encounter. I am having Ms. Corter maintain her Vitamin B-12, Cholecalciferol, NITROSTAT, aspirin, cyclopentolate, gabapentin, donepezil, febuxostat, losartan, amLODipine, OLANZapine, and DULoxetine.  No orders of  the defined types were placed in this encounter.     Follow-up: No Follow-up on file.  Walker Kehr, MD

## 2015-02-15 NOTE — Assessment & Plan Note (Signed)
Losartan, amlodipine 

## 2015-02-15 NOTE — Progress Notes (Signed)
Pre visit review using our clinic review tool, if applicable. No additional management support is needed unless otherwise documented below in the visit note. 

## 2015-02-15 NOTE — Assessment & Plan Note (Signed)
Chronic w/panic attacks Duloxetine She has some degree of a conversion disorder

## 2015-02-16 LAB — ANA: Anti Nuclear Antibody(ANA): NEGATIVE

## 2015-02-20 ENCOUNTER — Ambulatory Visit (HOSPITAL_COMMUNITY): Payer: Self-pay | Admitting: Psychiatry

## 2015-03-02 ENCOUNTER — Encounter (HOSPITAL_COMMUNITY): Payer: Self-pay | Admitting: Psychiatry

## 2015-03-02 ENCOUNTER — Ambulatory Visit (INDEPENDENT_AMBULATORY_CARE_PROVIDER_SITE_OTHER): Payer: Medicare Other | Admitting: Psychiatry

## 2015-03-02 VITALS — BP 156/82 | HR 74 | Ht 63.5 in | Wt 170.2 lb

## 2015-03-02 DIAGNOSIS — F0630 Mood disorder due to known physiological condition, unspecified: Secondary | ICD-10-CM | POA: Diagnosis not present

## 2015-03-02 MED ORDER — OLANZAPINE 7.5 MG PO TABS: 7.5000 mg | ORAL_TABLET | Freq: Every day | ORAL | Status: DC

## 2015-03-02 NOTE — Progress Notes (Signed)
[facility] progress Note  Alice Wong [id number] 81 y.o.  03/02/2015 10:55 AM  Chief Complaint:  I'm doing better.  I'm taking medication on time.      History of Present Illness:  Alice Wong came for her follow-up appointment with the translator and with her daughter.  she is taking Zyprexa 7.5 mg at bedtime and Cymbalta 60 mg daily.  Her daughter endorse she is compliant on the regular basis is seen much improvement in her mood sleep and irritability.  She is spending most of the time at her daughter's house and daughter endorse that she is helping a lot her husband and helping in daily routines.  Recently she's seen her primary care physician and there has been no changes.  Patient denies any paranoia, hallucination or any anger issues.  She denies any crying spells or any feeling of hopelessness or worthlessness.  She has no tremors shakes or any EPS.  Daughter endorse that she is more pleasant, social and active.  Patient also mention better mood with the medication and she wants to continue psychiatric medication.  Her appetite is okay.  She denies any hallucination or any paranoia.  She denies drinking or using any illegal substances.  Suicidal Ideation: No Plan Formed: No Patient has means to carry out plan: No  Homicidal Ideation: No Plan Formed: No Patient has means to carry out plan: No  Past Psychiatric History/Hospitalization(s): Patient is taking Prozac from primary care physician for past 7-8 years for depression and anxiety symptoms.  She do not recall any inpatient psychiatric treatment or any suicidal attempt.  She admitted irritability, anger and mood swings but symptoms started to get worse in past few months.  Patient denies any history of psychosis.  We tried Depakote 500 mg but patient did not see any improvement with the medication. Anxiety: Yes Bipolar Disorder: No Depression: Yes Mania: No Psychosis: No Schizophrenia: No Personality Disorder:  No Hospitalization for psychiatric illness: No History of Electroconvulsive Shock Therapy: No Prior Suicide Attempts: No  Medical History; Patient has history of GERD, hyperlipidemia, hypertension, osteoporosis, cerebrovascular accident, cholelithiasis, moderate regurgitation, homicidal pia, memory impairment, arthritis, gout, vitamin D deficiency and neuropathy.  She also endorse history of syncopal episodes when she was in [place].  She remember having extensive neurology workup.  Her primary care physician is Dr Johnanna Schneiders.  Review of Systems: Psychiatric: Agitation: Yes Hallucination: No Depressed Mood: Yes Insomnia: Yes Hypersomnia: No Altered Concentration: Yes Feels Worthless: No Grandiose Ideas: No Belief In Special Powers: No New/Increased Substance Abuse: No Compulsions: No  Neurologic: Headache: Yes Seizure: No Paresthesias: Yes   Outpatient Encounter Prescriptions as of 03/02/2015  Medication Sig  . amLODipine (NORVASC) 5 MG tablet TAKE ONE TABLET BY MOUTH ONCE DAILY  . aspirin EC 81 MG EC tablet Take 1 tablet (81 mg total) by mouth daily.  . Cholecalciferol 1000 UNITS tablet Take 1,000 Units by mouth daily.   . Cyanocobalamin (VITAMIN B-12) 1000 MCG SUBL Place 1 tablet under the tongue daily.    . cyclopentolate (CYCLODRYL,CYCLOGYL) 2 % ophthalmic solution Reported on 02/15/2015  . donepezil (ARICEPT) 10 MG tablet TAKE ONE TABLET BY MOUTH AT BEDTIME  . DULoxetine (CYMBALTA) 60 MG capsule Take 1 capsule (60 mg total) by mouth daily.  . febuxostat (ULORIC) 40 MG tablet Take 1 tablet (40 mg total) by mouth daily. (Patient not taking: Reported on 02/15/2015)  . gabapentin (NEURONTIN) 300 MG capsule TAKE ONE CAPSULE BY MOUTH THREE TIMES DAILY  . losartan (  COZAAR) 100 MG tablet Take 1 tablet (100 mg total) by mouth daily.  . nitroGLYCERIN (NITROSTAT) 0.4 MG SL tablet Place 1 tablet (0.4 mg total) under the tongue every 5 (five) minutes as needed for chest pain.  Marland Kitchen OLANZapine  (ZYPREXA) 7.5 MG tablet Take 1 tablet (7.5 mg total) by mouth at bedtime.  . [DISCONTINUED] OLANZapine (ZYPREXA) 7.5 MG tablet Take 1 tablet (7.5 mg total) by mouth at bedtime.   No facility-administered encounter medications on file as of 03/02/2015.    Recent Results (from the past 2160 hour(s))  ANA     Status: None   Collection Time: 02/15/15  8:19 AM  Result Value Ref Range   Anit Nuclear Antibody(ANA) NEG NEGATIVE      Constitutional:  BP 156/82 mmHg  Pulse 74  Ht 5' 3.5" (1.613 m)  Wt 170 lb 3.2 oz (77.202 kg)  BMI 29.67 kg/m2   Musculoskeletal: Strength & Muscle Tone: decreased Gait & Station: normal Patient leans: N/A  Psychiatric Specialty Exam: General Appearance: Casual  Eye Contact::  Fair  Speech:  NA  Volume:  Normal  Mood:  Euthymic  Affect:  Appropriate  Thought Process:  Coherent  Orientation:  Full (Time, Place, and Person)  Thought Content:  WDL  Suicidal Thoughts:  No  Homicidal Thoughts:  No  Memory:  Immediate;   Fair Recent;   Fair Remote;   Fair  Judgement:  Fair  Insight:  Fair  Psychomotor Activity:  Normal  Concentration:  Fair  Recall:  [employer] of Knowledge:  Fair  Language:  Poor  Akathisia:  No  Handed:  Right  AIMS (if indicated):     Assets:  Desire for Improvement Financial Resources/Insurance Housing Social Support  ADL's:  Intact  Cognition:  Impaired,  Mild  Sleep:        Established Problem, Stable/Improving (1), Review of Last Therapy Session (1) and Review of Medication Regimen & Side Effects (2)  Assessment: Axis I: Mood disorder due to general medical condition.  Depression due to memory impairment and other medical illnesses.  Depressive disorder NOS, cognitive impairment NOS  Axis II: Deferred  Axis III:  Past Medical History  Diagnosis Date  . Anxiety   . Depression   . GERD (gastroesophageal reflux disease)   . Hyperlipidemia   . Hypertension   . Osteoporosis   . CVA (cerebral vascular  accident) ([place])   . Thyroid cyst   . Cholelithiasis   . MR (mitral regurgitation)   . Memory loss   . Gout 2009  . Vitamin D deficiency   . H. pylori infection 2011  . Vaso-vagal reaction   . Thrombocytopenia ([place])   . Diverticulosis   . S/P cardiac cath 12/20/13 12/20/2013     Plan:  Patient is a stable on her current psychiatric medication.  She has no side effects.  Continue Cymbalta 60 mg daily and Zyprexa 7.5 mg at bedtime.  Patient seen much improvement since taking her medication on a regular basis.  Discussed vacation side effects and benefits.  Recommended to call us back if she has any question or any concern.  Follow-up in 3 months.  Wyatte Dames T., MD 03/02/2015

## 2015-04-08 ENCOUNTER — Other Ambulatory Visit (HOSPITAL_COMMUNITY): Payer: Self-pay | Admitting: Psychiatry

## 2015-04-11 ENCOUNTER — Other Ambulatory Visit (HOSPITAL_COMMUNITY): Payer: Self-pay | Admitting: Psychiatry

## 2015-04-11 DIAGNOSIS — F0630 Mood disorder due to known physiological condition, unspecified: Secondary | ICD-10-CM

## 2015-04-11 MED ORDER — DULOXETINE HCL 60 MG PO CPEP: 60.0000 mg | ORAL_CAPSULE | Freq: Every day | ORAL | Status: DC

## 2015-05-19 ENCOUNTER — Ambulatory Visit (INDEPENDENT_AMBULATORY_CARE_PROVIDER_SITE_OTHER): Payer: Medicare Other | Admitting: Internal Medicine

## 2015-05-19 ENCOUNTER — Other Ambulatory Visit (INDEPENDENT_AMBULATORY_CARE_PROVIDER_SITE_OTHER): Payer: Medicare Other

## 2015-05-19 ENCOUNTER — Encounter: Payer: Self-pay | Admitting: Internal Medicine

## 2015-05-19 VITALS — BP 148/80 | HR 73 | Wt 173.0 lb

## 2015-05-19 DIAGNOSIS — F0391 Unspecified dementia with behavioral disturbance: Secondary | ICD-10-CM

## 2015-05-19 DIAGNOSIS — F0392 Unspecified dementia, unspecified severity, with psychotic disturbance: Secondary | ICD-10-CM

## 2015-05-19 DIAGNOSIS — I10 Essential (primary) hypertension: Secondary | ICD-10-CM

## 2015-05-19 DIAGNOSIS — I63419 Cerebral infarction due to embolism of unspecified middle cerebral artery: Secondary | ICD-10-CM

## 2015-05-19 LAB — TSH: TSH: 4.98 u[IU]/mL — ABNORMAL HIGH (ref 0.35–4.50)

## 2015-05-19 LAB — CBC WITH DIFFERENTIAL/PLATELET
Basophils Absolute: 0 10*3/uL (ref 0.0–0.1)
Basophils Relative: 0.3 % (ref 0.0–3.0)
Eosinophils Absolute: 0 10*3/uL (ref 0.0–0.7)
Eosinophils Relative: 0.7 % (ref 0.0–5.0)
HCT: 39.4 % (ref 36.0–46.0)
Hemoglobin: 13.9 g/dL (ref 12.0–15.0)
Lymphocytes Relative: 16.1 % (ref 12.0–46.0)
Lymphs Abs: 1.1 10*3/uL (ref 0.7–4.0)
MCHC: 35.3 g/dL (ref 30.0–36.0)
MCV: 90.7 fl (ref 78.0–100.0)
Monocytes Absolute: 0.6 10*3/uL (ref 0.1–1.0)
Monocytes Relative: 8.3 % (ref 3.0–12.0)
Neutro Abs: 5.1 10*3/uL (ref 1.4–7.7)
Neutrophils Relative %: 74.6 % (ref 43.0–77.0)
Platelets: 219 10*3/uL (ref 150.0–400.0)
RBC: 4.35 Mil/uL (ref 3.87–5.11)
RDW: 12.5 % (ref 11.5–15.5)
WBC: 6.9 10*3/uL (ref 4.0–10.5)

## 2015-05-19 LAB — HEPATIC FUNCTION PANEL
ALT: 27 U/L (ref 0–35)
AST: 20 U/L (ref 0–37)
Albumin: 4.6 g/dL (ref 3.5–5.2)
Alkaline Phosphatase: 66 U/L (ref 39–117)
Bilirubin, Direct: 0.1 mg/dL (ref 0.0–0.3)
Total Bilirubin: 0.7 mg/dL (ref 0.2–1.2)
Total Protein: 7.2 g/dL (ref 6.0–8.3)

## 2015-05-19 MED ORDER — MEMANTINE HCL-DONEPEZIL HCL ER 28-10 MG PO CP24: 1.0000 | ORAL_CAPSULE | Freq: Every day | ORAL | Status: DC

## 2015-05-19 NOTE — Progress Notes (Signed)
Pre visit review using our clinic review tool, if applicable. No additional management support is needed unless otherwise documented below in the visit note. 

## 2015-05-19 NOTE — Assessment & Plan Note (Signed)
On ASA 

## 2015-05-19 NOTE — Progress Notes (Signed)
Subjective:  Patient ID: Alice Wong, female    DOB: 05/30/1934  Age: 81 y.o. MRN: [id number]  CC: No chief complaint on file.   HPI Alice Wong presents for HTN, depression, HA, angina f/u  Outpatient Prescriptions Prior to Visit  Medication Sig Dispense Refill  . amLODipine (NORVASC) 5 MG tablet TAKE ONE TABLET BY MOUTH ONCE DAILY 90 tablet 3  . aspirin EC 81 MG EC tablet Take 1 tablet (81 mg total) by mouth daily.    . Cholecalciferol 1000 UNITS tablet Take 1,000 Units by mouth daily.     . Cyanocobalamin (VITAMIN B-12) 1000 MCG SUBL Place 1 tablet under the tongue daily.      . cyclopentolate (CYCLODRYL,CYCLOGYL) 2 % ophthalmic solution Reported on 02/15/2015    . donepezil (ARICEPT) 10 MG tablet TAKE ONE TABLET BY MOUTH AT BEDTIME 90 tablet 3  . DULoxetine (CYMBALTA) 60 MG capsule TAKE ONE CAPSULE BY MOUTH ONCE DAILY 90 capsule 0  . DULoxetine (CYMBALTA) 60 MG capsule Take 1 capsule (60 mg total) by mouth daily. 90 capsule 0  . febuxostat (ULORIC) 40 MG tablet Take 1 tablet (40 mg total) by mouth daily. 30 tablet 2  . gabapentin (NEURONTIN) 300 MG capsule TAKE ONE CAPSULE BY MOUTH THREE TIMES DAILY 90 capsule 11  . losartan (COZAAR) 100 MG tablet Take 1 tablet (100 mg total) by mouth daily. 90 tablet 3  . nitroGLYCERIN (NITROSTAT) 0.4 MG SL tablet Place 1 tablet (0.4 mg total) under the tongue every 5 (five) minutes as needed for chest pain. 20 tablet 3  . OLANZapine (ZYPREXA) 7.5 MG tablet Take 1 tablet (7.5 mg total) by mouth at bedtime. 90 tablet 0   No facility-administered medications prior to visit.    ROS Review of Systems  Constitutional: Positive for fatigue. Negative for chills, activity change, appetite change and unexpected weight change.  HENT: Negative for congestion, mouth sores and sinus pressure.   Eyes: Negative for visual disturbance.  Respiratory: Negative for cough and chest tightness.   Gastrointestinal: Negative for nausea, abdominal pain and  diarrhea.  Endocrine: Negative for polyuria.  Genitourinary: Negative for frequency, difficulty urinating and vaginal pain.  Musculoskeletal: Negative for back pain and gait problem.  Skin: Negative for pallor and rash.  Neurological: Positive for dizziness, weakness, light-headedness and headaches. Negative for tremors, facial asymmetry and numbness.  Hematological: Negative for adenopathy. Does not bruise/bleed easily.  Psychiatric/Behavioral: Positive for behavioral problems and decreased concentration. Negative for confusion, sleep disturbance and self-injury. The patient is nervous/anxious.     Objective:  BP 148/80 mmHg  Pulse 73  Wt 173 lb (78.472 kg)  SpO2 96%  BP Readings from Last 3 Encounters:  05/19/15 148/80  03/02/15 156/82  02/15/15 130/80    Wt Readings from Last 3 Encounters:  05/19/15 173 lb (78.472 kg)  03/02/15 170 lb 3.2 oz (77.202 kg)  02/15/15 172 lb (78.019 kg)    Physical Exam  Constitutional: She appears well-developed. No distress.  HENT:  Head: Normocephalic.  Right Ear: External ear normal.  Left Ear: External ear normal.  Nose: Nose normal.  Mouth/Throat: Oropharynx is clear and moist.  Eyes: Conjunctivae are normal. Pupils are equal, round, and reactive to light. Right eye exhibits no discharge. Left eye exhibits no discharge.  Neck: Normal range of motion. Neck supple. No JVD present. No tracheal deviation present. No thyromegaly present.  Cardiovascular: Normal rate, regular rhythm and normal heart sounds.   Pulmonary/Chest: No stridor. No respiratory distress.  She has no wheezes.  Abdominal: Soft. Bowel sounds are normal. She exhibits no distension and no mass. There is no tenderness. There is no rebound and no guarding.  Musculoskeletal: She exhibits no edema or tenderness.  Lymphadenopathy:    She has no cervical adenopathy.  Neurological: She displays normal reflexes. No cranial nerve deficit. She exhibits normal muscle tone.  Coordination abnormal.  Skin: No rash noted. No erythema.  Psychiatric: Her behavior is normal. Judgment and thought content normal.  NAD. Distant...  Lab Results  Component Value Date   WBC 4.6 09/25/2014   HGB 11.1* 09/25/2014   HCT 32.1* 09/25/2014   PLT 171 09/25/2014   GLUCOSE 131* 09/25/2014   CHOL 166 08/23/2011   TRIG 115.0 08/23/2011   HDL 36.90* 08/23/2011   LDLCALC 106* 08/23/2011   ALT 21 08/23/2014   AST 20 08/23/2014   NA 140 09/25/2014   K 3.7 09/25/2014   CL 107 09/25/2014   CREATININE 1.03* 09/25/2014   BUN 14 09/25/2014   CO2 26 09/25/2014   TSH 3.46 04/13/2014   INR 1.05 12/17/2013   HGBA1C 6.1 08/16/2014    Dg Chest 2 View  02/15/2015  CLINICAL DATA:  Hypertension. EXAM: CHEST  2 VIEW COMPARISON:  04/21/2014.  03/22/2014 . FINDINGS: Mediastinum hilar structures normal. Low lung volumes with mild bibasilar atelectasis. No prominent pleural effusion no pneumothorax. Diffuse osteopenia degenerative change. IMPRESSION: Low lung volumes with mild bibasilar atelectasis. Electronically Signed   By: Marcello Moores  Register   On: 02/15/2015 09:15    Assessment & Plan:   There are no diagnoses linked to this encounter. I am having Ms. Bowland maintain her Vitamin B-12, Cholecalciferol, aspirin, cyclopentolate, gabapentin, donepezil, febuxostat, losartan, amLODipine, nitroGLYCERIN, OLANZapine, DULoxetine, and DULoxetine.  No orders of the defined types were placed in this encounter.     Follow-up: No Follow-up on file.  Walker Kehr, MD

## 2015-05-19 NOTE — Assessment & Plan Note (Signed)
Chronic dementia w/psychotic traits - worse. Alice Wong is noncompliant w/meds 7/16 Worsening dementia

## 2015-05-19 NOTE — Assessment & Plan Note (Signed)
Chronic labile Losartan, amlodipine Orthostatic lightheadedness - hard to manage - the best we can do... Risks associated with treatment noncompliance were discussed. Compliance was encouraged.

## 2015-05-22 DIAGNOSIS — Z961 Presence of intraocular lens: Secondary | ICD-10-CM | POA: Diagnosis not present

## 2015-05-22 DIAGNOSIS — H35361 Drusen (degenerative) of macula, right eye: Secondary | ICD-10-CM | POA: Diagnosis not present

## 2015-05-22 DIAGNOSIS — H35371 Puckering of macula, right eye: Secondary | ICD-10-CM | POA: Diagnosis not present

## 2015-05-22 DIAGNOSIS — H35033 Hypertensive retinopathy, bilateral: Secondary | ICD-10-CM | POA: Diagnosis not present

## 2015-05-22 LAB — ANA: Anti Nuclear Antibody(ANA): NEGATIVE

## 2015-05-31 ENCOUNTER — Ambulatory Visit (HOSPITAL_COMMUNITY): Payer: Self-pay | Admitting: Psychiatry

## 2015-06-24 ENCOUNTER — Other Ambulatory Visit (HOSPITAL_COMMUNITY): Payer: Self-pay | Admitting: Psychiatry

## 2015-06-27 ENCOUNTER — Ambulatory Visit (HOSPITAL_COMMUNITY): Payer: Self-pay | Admitting: Psychiatry

## 2015-07-01 ENCOUNTER — Other Ambulatory Visit (HOSPITAL_COMMUNITY): Payer: Self-pay | Admitting: Psychiatry

## 2015-07-04 ENCOUNTER — Ambulatory Visit (INDEPENDENT_AMBULATORY_CARE_PROVIDER_SITE_OTHER): Payer: Medicare Other | Admitting: Psychiatry

## 2015-07-04 ENCOUNTER — Encounter (HOSPITAL_COMMUNITY): Payer: Self-pay | Admitting: Psychiatry

## 2015-07-04 VITALS — BP 140/82 | HR 79 | Ht 65.0 in | Wt 177.0 lb

## 2015-07-04 DIAGNOSIS — I63419 Cerebral infarction due to embolism of unspecified middle cerebral artery: Secondary | ICD-10-CM | POA: Diagnosis not present

## 2015-07-04 DIAGNOSIS — F0630 Mood disorder due to known physiological condition, unspecified: Secondary | ICD-10-CM

## 2015-07-04 MED ORDER — OLANZAPINE 7.5 MG PO TABS: 7.5000 mg | ORAL_TABLET | Freq: Every day | ORAL | Status: DC

## 2015-07-04 MED ORDER — DULOXETINE HCL 60 MG PO CPEP: 60.0000 mg | ORAL_CAPSULE | Freq: Every day | ORAL | Status: DC

## 2015-07-04 NOTE — Progress Notes (Signed)
[place] [phone] progress Note  Alice Wong [id number] 80 y.o.  07/04/2015 10:05 AM  Chief Complaint:  My husband died and one week before my son died in [place].  I'm very sad.        History of Present Illness:  [name] came for her follow-up appointment with the translator and with her daughter.  Most of the information was obtained through the translator and through her daughter.  Recently her husband who has dementia and died and one week before her son who had a massive stroke died in [place].  Patient endorsed lately she's been feeling sad, isolated, withdrawn and going through grief.  Daughter endorse that she is not agitated and she sleeping better but she has noticed lack of motivation.  She sleeping better.  She denies any anger, paranoia, hallucination or any self abusive behavior.  Patient like her current psychiatric medication but today she is not as social and active and appears somewhat sad.  She has no tremors, concerns or any side effects from the medication.  He is taking Zyprexa 7.5 mg at bedtime and Cymbalta 60 mg daily.  Currently she is living with her daughter because she does not want to live by herself.  She recently seen her primary care physician and her blood work was drawn which is normal.  She admitted some time chronic somatic symptoms but no major issues.  Patient denies drinking alcohol or using any illegal substances.  Her appetite is fair.  Her vital signs are stable.  Suicidal Ideation: No Plan Formed: No Patient has means to carry out plan: No  Homicidal Ideation: No Plan Formed: No Patient has means to carry out plan: No  Past Psychiatric History/Hospitalization(s): Patient is taking Prozac from primary care physician for past 7-8 years for depression and anxiety symptoms.  She do not recall any inpatient psychiatric treatment or any suicidal attempt.  She admitted irritability, anger and mood swings but symptoms started to get worse in past few  months.  Patient denies any history of psychosis.  We tried Depakote 500 mg but patient did not see any improvement with the medication. Anxiety: Yes Bipolar Disorder: No Depression: Yes Mania: No Psychosis: No Schizophrenia: No Personality Disorder: No Hospitalization for psychiatric illness: No History of Electroconvulsive Shock Therapy: No Prior Suicide Attempts: No  Medical History; Patient has history of GERD, hyperlipidemia, hypertension, osteoporosis, cerebrovascular accident, cholelithiasis, moderate regurgitation, homicidal pia, memory impairment, arthritis, gout, vitamin D deficiency and neuropathy.  She also endorse history of syncopal episodes when she was in [place].  She remember having extensive neurology workup.  Her primary care physician is Dr Johnanna Schneiders.  Review of Systems: Psychiatric: Agitation: Yes Hallucination: No Depressed Mood: Yes Insomnia: Yes Hypersomnia: No Altered Concentration: Yes Feels Worthless: No Grandiose Ideas: No Belief In Special Powers: No New/Increased Substance Abuse: No Compulsions: No  Neurologic: Headache: Yes Seizure: No Paresthesias: Yes   Outpatient Encounter Prescriptions as of 07/04/2015  Medication Sig  . amLODipine (NORVASC) 5 MG tablet TAKE ONE TABLET BY MOUTH ONCE DAILY  . aspirin EC 81 MG EC tablet Take 1 tablet (81 mg total) by mouth daily.  . Cholecalciferol 1000 UNITS tablet Take 1,000 Units by mouth daily.   . Cyanocobalamin (VITAMIN B-12) 1000 MCG SUBL Place 1 tablet under the tongue daily.    . cyclopentolate (CYCLODRYL,CYCLOGYL) 2 % ophthalmic solution Reported on 02/15/2015  . DULoxetine (CYMBALTA) 60 MG capsule Take 1 capsule (60 mg total) by mouth daily.  . febuxostat (  ULORIC) 40 MG tablet Take 1 tablet (40 mg total) by mouth daily.  Marland Kitchen gabapentin (NEURONTIN) 300 MG capsule TAKE ONE CAPSULE BY MOUTH THREE TIMES DAILY  . losartan (COZAAR) 100 MG tablet Take 1 tablet (100 mg total) by mouth daily.  . Memantine  HCl-Donepezil HCl (NAMZARIC) 28-10 MG CP24 Take 1 capsule by mouth daily.  . nitroGLYCERIN (NITROSTAT) 0.4 MG SL tablet Place 1 tablet (0.4 mg total) under the tongue every 5 (five) minutes as needed for chest pain.  Marland Kitchen OLANZapine (ZYPREXA) 7.5 MG tablet Take 1 tablet (7.5 mg total) by mouth at bedtime.  . [DISCONTINUED] DULoxetine (CYMBALTA) 60 MG capsule Take 1 capsule (60 mg total) by mouth daily.  . [DISCONTINUED] OLANZapine (ZYPREXA) 7.5 MG tablet Take 1 tablet (7.5 mg total) by mouth at bedtime.   No facility-administered encounter medications on file as of 07/04/2015.    Recent Results (from the past 2160 hour(s))  ANA     Status: None   Collection Time: 05/19/15  8:23 AM  Result Value Ref Range   Anit Nuclear Antibody(ANA) NEG NEGATIVE  CBC with Differential/Platelet     Status: None   Collection Time: 05/19/15  8:23 AM  Result Value Ref Range   WBC 6.9 4.0 - 10.5 K/uL   RBC 4.35 3.87 - 5.11 Mil/uL   Hemoglobin 13.9 12.0 - 15.0 g/dL   HCT 39.4 36.0 - 46.0 %   MCV 90.7 78.0 - 100.0 fl   MCHC 35.3 30.0 - 36.0 g/dL   RDW 12.5 11.5 - 15.5 %   Platelets 219.0 150.0 - 400.0 K/uL   Neutrophils Relative % 74.6 43.0 - 77.0 %   Lymphocytes Relative 16.1 12.0 - 46.0 %   Monocytes Relative 8.3 3.0 - 12.0 %   Eosinophils Relative 0.7 0.0 - 5.0 %   Basophils Relative 0.3 0.0 - 3.0 %   Neutro Abs 5.1 1.4 - 7.7 K/uL   Lymphs Abs 1.1 0.7 - 4.0 K/uL   Monocytes Absolute 0.6 0.1 - 1.0 K/uL   Eosinophils Absolute 0.0 0.0 - 0.7 K/uL   Basophils Absolute 0.0 0.0 - 0.1 K/uL  Hepatic function panel     Status: None   Collection Time: 05/19/15  8:23 AM  Result Value Ref Range   Total Bilirubin 0.7 0.2 - 1.2 mg/dL   Bilirubin, Direct 0.1 0.0 - 0.3 mg/dL   Alkaline Phosphatase 66 39 - 117 U/L   AST 20 0 - 37 U/L   ALT 27 0 - 35 U/L   Total Protein 7.2 6.0 - 8.3 g/dL   Albumin 4.6 3.5 - 5.2 g/dL  TSH     Status: Abnormal   Collection Time: 05/19/15  8:23 AM  Result Value Ref Range   TSH  4.98 (H) 0.35 - 4.50 uIU/mL      Constitutional:  BP 140/82 mmHg  Pulse 79  Ht 5\' 5"  (1.651 m)  Wt 177 lb (80.287 kg)  BMI 29.45 kg/m2   Musculoskeletal: Strength & Muscle Tone: decreased Gait & Station: normal Patient leans: N/A  Psychiatric Specialty Exam: General Appearance: Casual and Guarded  Eye Contact::  Fair  Speech:  NA  Volume:  Normal  Mood:  Depressed and Dysphoric  Affect:  Depressed  Thought Process:  Coherent  Orientation:  Full (Time, Place, and Person)  Thought Content:  WDL  Suicidal Thoughts:  No  Homicidal Thoughts:  No  Memory:  Immediate;   Fair Recent;   Fair Remote;   Fair  Judgement:  Fair  Insight:  Fair  Psychomotor Activity:  Normal  Concentration:  Fair  Recall:  [employer] of Knowledge:  Fair  Language:  Poor  Akathisia:  No  Handed:  Right  AIMS (if indicated):     Assets:  Desire for Improvement Financial Resources/Insurance Housing Social Support  ADL's:  Intact  Cognition:  Impaired,  Mild  Sleep:        Established Problem, Stable/Improving (1), New problem, with additional work up planned, Review of Psycho-Social Stressors (1), Review or order clinical lab tests (1), Review of Last Therapy Session (1) and Review of Medication Regimen & Side Effects (2)  Assessment: Axis I: Mood disorder due to general medical condition.  Depression due to memory impairment and other medical illnesses.  Depressive disorder NOS, cognitive impairment NOS, grief  Axis II: Deferred  Axis III:  Past Medical History  Diagnosis Date  . Anxiety   . Depression   . GERD (gastroesophageal reflux disease)   . Hyperlipidemia   . Hypertension   . Osteoporosis   . CVA (cerebral vascular accident) ([place])   . Thyroid cyst   . Cholelithiasis   . MR (mitral regurgitation)   . Memory loss   . Gout 2009  . Vitamin D deficiency   . H. pylori infection 2011  . Vaso-vagal reaction   . Thrombocytopenia ([place])   . Diverticulosis   . S/P cardiac  cath 12/20/13 12/20/2013     Plan:  Patient is going through grief and she need to see a counselor .  I will recommend Russian-speaking counselor at [facility] of life therapy.  Patient does not want to change her medication.  I review her blood work results and collateral information.  I will continue Cymbalta 60 mg daily and Zyprexa 7.5 mg at bedtime.  She has no side effects including any tremors shakes or any EPS.  I recommended to call us back if she feels worsening of the symptoms.  Follow-up in 3 months.   Discuss safety plan that anytime having active suicidal thoughts or homicidal thoughts and she need to call 911 or go to the local emergency room.   ARFEEN,SYED T., MD 07/04/2015

## 2015-08-21 ENCOUNTER — Ambulatory Visit: Payer: Self-pay | Admitting: Internal Medicine

## 2015-08-23 ENCOUNTER — Other Ambulatory Visit: Payer: Self-pay | Admitting: Internal Medicine

## 2015-08-23 ENCOUNTER — Other Ambulatory Visit (HOSPITAL_COMMUNITY): Payer: Self-pay | Admitting: Psychiatry

## 2015-08-23 DIAGNOSIS — F0630 Mood disorder due to known physiological condition, unspecified: Secondary | ICD-10-CM

## 2015-09-22 ENCOUNTER — Other Ambulatory Visit (INDEPENDENT_AMBULATORY_CARE_PROVIDER_SITE_OTHER): Payer: Medicare Other

## 2015-09-22 ENCOUNTER — Encounter: Payer: Self-pay | Admitting: Internal Medicine

## 2015-09-22 ENCOUNTER — Ambulatory Visit (INDEPENDENT_AMBULATORY_CARE_PROVIDER_SITE_OTHER): Payer: Medicare Other | Admitting: Internal Medicine

## 2015-09-22 DIAGNOSIS — F0630 Mood disorder due to known physiological condition, unspecified: Secondary | ICD-10-CM

## 2015-09-22 DIAGNOSIS — R197 Diarrhea, unspecified: Secondary | ICD-10-CM | POA: Diagnosis not present

## 2015-09-22 DIAGNOSIS — F0392 Unspecified dementia, unspecified severity, with psychotic disturbance: Secondary | ICD-10-CM

## 2015-09-22 DIAGNOSIS — F068 Other specified mental disorders due to known physiological condition: Secondary | ICD-10-CM

## 2015-09-22 DIAGNOSIS — F0390 Unspecified dementia without behavioral disturbance: Secondary | ICD-10-CM

## 2015-09-22 DIAGNOSIS — I10 Essential (primary) hypertension: Secondary | ICD-10-CM

## 2015-09-22 DIAGNOSIS — I63419 Cerebral infarction due to embolism of unspecified middle cerebral artery: Secondary | ICD-10-CM | POA: Diagnosis not present

## 2015-09-22 LAB — CBC WITH DIFFERENTIAL/PLATELET
Basophils Absolute: 0 10*3/uL (ref 0.0–0.1)
Basophils Relative: 0.3 % (ref 0.0–3.0)
Eosinophils Absolute: 0 10*3/uL (ref 0.0–0.7)
Eosinophils Relative: 0.4 % (ref 0.0–5.0)
HCT: 40.3 % (ref 36.0–46.0)
Hemoglobin: 14.1 g/dL (ref 12.0–15.0)
Lymphocytes Relative: 12.3 % (ref 12.0–46.0)
Lymphs Abs: 1.1 10*3/uL (ref 0.7–4.0)
MCHC: 34.8 g/dL (ref 30.0–36.0)
MCV: 89.9 fl (ref 78.0–100.0)
Monocytes Absolute: 0.6 10*3/uL (ref 0.1–1.0)
Monocytes Relative: 6.6 % (ref 3.0–12.0)
Neutro Abs: 7.3 10*3/uL (ref 1.4–7.7)
Neutrophils Relative %: 80.4 % — ABNORMAL HIGH (ref 43.0–77.0)
Platelets: 319 10*3/uL (ref 150.0–400.0)
RBC: 4.49 Mil/uL (ref 3.87–5.11)
RDW: 12.3 % (ref 11.5–15.5)
WBC: 9.1 10*3/uL (ref 4.0–10.5)

## 2015-09-22 LAB — HEPATIC FUNCTION PANEL
ALT: 13 U/L (ref 0–35)
AST: 15 U/L (ref 0–37)
Albumin: 3.1 g/dL — ABNORMAL LOW (ref 3.5–5.2)
Alkaline Phosphatase: 63 U/L (ref 39–117)
Bilirubin, Direct: 0.1 mg/dL (ref 0.0–0.3)
Total Bilirubin: 0.5 mg/dL (ref 0.2–1.2)
Total Protein: 5.4 g/dL — ABNORMAL LOW (ref 6.0–8.3)

## 2015-09-22 LAB — TSH: TSH: 2.69 u[IU]/mL (ref 0.35–4.50)

## 2015-09-22 LAB — BASIC METABOLIC PANEL
BUN: 16 mg/dL (ref 6–23)
CO2: 28 mEq/L (ref 19–32)
Calcium: 8.5 mg/dL (ref 8.4–10.5)
Chloride: 101 mEq/L (ref 96–112)
Creatinine, Ser: 0.91 mg/dL (ref 0.40–1.20)
GFR: 63.36 mL/min (ref >60.00)
Glucose, Bld: 96 mg/dL (ref 70–99)
Potassium: 4.1 mEq/L (ref 3.5–5.1)
Sodium: 136 mEq/L (ref 135–145)

## 2015-09-22 MED ORDER — DIPHENOXYLATE-ATROPINE 2.5-0.025 MG PO TABS: 1.0000 | ORAL_TABLET | Freq: Four times a day (QID) | ORAL | 1 refills | Status: DC | PRN

## 2015-09-22 NOTE — Assessment & Plan Note (Addendum)
Worse 

## 2015-09-22 NOTE — Assessment & Plan Note (Signed)
Losartan, amlodipine Orthostatic lightheadedness - hard to manage - the best we can do... Risks associated with treatment noncompliance were discussed. Compliance was encouraged. 

## 2015-09-22 NOTE — Progress Notes (Signed)
Subjective:  Patient ID: Alice Wong, female    DOB: Apr 04, 1934  Age: 81 y.o. MRN: [id number]  CC: No chief complaint on file.   HPI KALONI BADOLATO presents for psychotic dementia - severe. She travelled to [place] where she "was lost", took a taxi across the country and ended up at the mental institution for 2 wks - she was confused, could not remember who she was...  She has been angry at times. She is a Biomedical engineer. C/o constant confusion per dtr. She is back on her meds...  Outpatient Medications Prior to Visit  Medication Sig Dispense Refill  . amLODipine (NORVASC) 5 MG tablet TAKE ONE TABLET BY MOUTH ONCE DAILY 90 tablet 1  . aspirin EC 81 MG EC tablet Take 1 tablet (81 mg total) by mouth daily.    . Cholecalciferol 1000 UNITS tablet Take 1,000 Units by mouth daily.     . Cyanocobalamin (VITAMIN B-12) 1000 MCG SUBL Place 1 tablet under the tongue daily.      . DULoxetine (CYMBALTA) 60 MG capsule Take 1 capsule (60 mg total) by mouth daily. 90 capsule 0  . gabapentin (NEURONTIN) 300 MG capsule TAKE ONE CAPSULE BY MOUTH THREE TIMES DAILY 90 capsule 11  . losartan (COZAAR) 100 MG tablet Take 1 tablet (100 mg total) by mouth daily. 90 tablet 3  . Memantine HCl-Donepezil HCl (NAMZARIC) 28-10 MG CP24 Take 1 capsule by mouth daily. 30 capsule 11  . nitroGLYCERIN (NITROSTAT) 0.4 MG SL tablet Place 1 tablet (0.4 mg total) under the tongue every 5 (five) minutes as needed for chest pain. 20 tablet 3  . OLANZapine (ZYPREXA) 7.5 MG tablet Take 1 tablet (7.5 mg total) by mouth at bedtime. 90 tablet 0  . cyclopentolate (CYCLODRYL,CYCLOGYL) 2 % ophthalmic solution Reported on 02/15/2015    . febuxostat (ULORIC) 40 MG tablet Take 1 tablet (40 mg total) by mouth daily. (Patient not taking: Reported on 09/22/2015) 30 tablet 2   No facility-administered medications prior to visit.     ROS Review of Systems  Constitutional: Positive for fatigue and unexpected weight change. Negative for activity  change, appetite change and chills.  HENT: Negative for congestion, mouth sores and sinus pressure.   Eyes: Negative for visual disturbance.  Respiratory: Negative for cough and chest tightness.   Gastrointestinal: Positive for diarrhea and nausea. Negative for abdominal pain.  Genitourinary: Negative for difficulty urinating, frequency and vaginal pain.  Musculoskeletal: Positive for gait problem. Negative for back pain.  Skin: Negative for pallor and rash.  Neurological: Negative for dizziness, tremors, weakness, numbness and headaches.  Psychiatric/Behavioral: Positive for decreased concentration, dysphoric mood, hallucinations and sleep disturbance. Negative for confusion and suicidal ideas. The patient is nervous/anxious.     Objective:  BP 118/82   Pulse 90   Temp 98.2 F (36.8 C) (Oral)   Ht 5\' 5"  (1.651 m)   Wt 147 lb (66.7 kg)   SpO2 98%   BMI 24.46 kg/m   BP Readings from Last 3 Encounters:  09/22/15 118/82  05/19/15 (!) 148/80  02/15/15 130/80    Wt Readings from Last 3 Encounters:  09/22/15 147 lb (66.7 kg)  05/19/15 173 lb (78.5 kg)  02/15/15 172 lb (78 kg)    Physical Exam  Constitutional: She appears well-developed. No distress.  HENT:  Head: Normocephalic.  Right Ear: External ear normal.  Left Ear: External ear normal.  Nose: Nose normal.  Mouth/Throat: Oropharynx is clear and moist.  Eyes: Conjunctivae are normal.  Pupils are equal, round, and reactive to light. Right eye exhibits no discharge. Left eye exhibits no discharge.  Neck: Normal range of motion. Neck supple. No JVD present. No tracheal deviation present. No thyromegaly present.  Cardiovascular: Normal rate, regular rhythm and normal heart sounds.   Pulmonary/Chest: No stridor. No respiratory distress. She has no wheezes.  Abdominal: Soft. Bowel sounds are normal. She exhibits no distension and no mass. There is no tenderness. There is no rebound and no guarding.  Musculoskeletal: She  exhibits no edema or tenderness.  Lymphadenopathy:    She has no cervical adenopathy.  Neurological: She displays normal reflexes. No cranial nerve deficit. She exhibits normal muscle tone. Coordination abnormal.  Skin: No rash noted. No erythema.  Confused, disoriented x2. No pronator drift.  Lab Results  Component Value Date   WBC 6.9 05/19/2015   HGB 13.9 05/19/2015   HCT 39.4 05/19/2015   PLT 219.0 05/19/2015   GLUCOSE 131 (H) 09/25/2014   CHOL 166 08/23/2011   TRIG 115.0 08/23/2011   HDL 36.90 (L) 08/23/2011   LDLCALC 106 (H) 08/23/2011   ALT 27 05/19/2015   AST 20 05/19/2015   NA 140 09/25/2014   K 3.7 09/25/2014   CL 107 09/25/2014   CREATININE 1.03 (H) 09/25/2014   BUN 14 09/25/2014   CO2 26 09/25/2014   TSH 4.98 (H) 05/19/2015   INR 1.05 12/17/2013   HGBA1C 6.1 08/16/2014    Dg Chest 2 View  Result Date: 02/15/2015 CLINICAL DATA:  Hypertension. EXAM: CHEST  2 VIEW COMPARISON:  04/21/2014.  03/22/2014 . FINDINGS: Mediastinum hilar structures normal. Low lung volumes with mild bibasilar atelectasis. No prominent pleural effusion no pneumothorax. Diffuse osteopenia degenerative change. IMPRESSION: Low lung volumes with mild bibasilar atelectasis. Electronically Signed   By: Marcello Moores  Register   On: 02/15/2015 09:15    Assessment & Plan:   There are no diagnoses linked to this encounter. I am having Ms. Oatis maintain her Vitamin B-12, Cholecalciferol, aspirin, cyclopentolate, gabapentin, febuxostat, losartan, nitroGLYCERIN, Memantine HCl-Donepezil HCl, OLANZapine, DULoxetine, and amLODipine.  No orders of the defined types were placed in this encounter.    Follow-up: No Follow-up on file.  Walker Kehr, MD

## 2015-09-22 NOTE — Progress Notes (Signed)
Pre visit review using our clinic review tool, if applicable. No additional management support is needed unless otherwise documented below in the visit note. 

## 2015-09-22 NOTE — Assessment & Plan Note (Signed)
New Labs - stool tests Lomotil prn

## 2015-09-24 ENCOUNTER — Encounter: Payer: Self-pay | Admitting: Internal Medicine

## 2015-09-27 ENCOUNTER — Other Ambulatory Visit: Payer: Medicare Other

## 2015-09-27 ENCOUNTER — Encounter: Payer: Self-pay | Admitting: Internal Medicine

## 2015-09-27 ENCOUNTER — Other Ambulatory Visit: Payer: Self-pay | Admitting: Internal Medicine

## 2015-09-27 DIAGNOSIS — R197 Diarrhea, unspecified: Secondary | ICD-10-CM | POA: Diagnosis not present

## 2015-09-28 ENCOUNTER — Other Ambulatory Visit: Payer: Self-pay | Admitting: Internal Medicine

## 2015-09-28 LAB — C. DIFFICILE GDH AND TOXIN A/B
C. difficile GDH: NOT DETECTED
C. difficile Toxin A/B: NOT DETECTED

## 2015-09-28 LAB — FECAL LACTOFERRIN, QUANT: Lactoferrin: POSITIVE

## 2015-09-28 MED ORDER — CIPROFLOXACIN HCL 250 MG PO TABS: 250.0000 mg | ORAL_TABLET | Freq: Two times a day (BID) | ORAL | 0 refills | Status: DC

## 2015-10-04 ENCOUNTER — Ambulatory Visit (HOSPITAL_COMMUNITY): Payer: Self-pay | Admitting: Psychiatry

## 2015-10-14 ENCOUNTER — Other Ambulatory Visit (HOSPITAL_COMMUNITY): Payer: Self-pay | Admitting: Psychiatry

## 2015-10-14 DIAGNOSIS — F0630 Mood disorder due to known physiological condition, unspecified: Secondary | ICD-10-CM

## 2015-10-25 ENCOUNTER — Ambulatory Visit (INDEPENDENT_AMBULATORY_CARE_PROVIDER_SITE_OTHER): Payer: Medicare Other | Admitting: Internal Medicine

## 2015-10-25 ENCOUNTER — Encounter: Payer: Self-pay | Admitting: Internal Medicine

## 2015-10-25 VITALS — BP 110/70 | HR 76 | Wt 160.0 lb

## 2015-10-25 DIAGNOSIS — F068 Other specified mental disorders due to known physiological condition: Secondary | ICD-10-CM

## 2015-10-25 DIAGNOSIS — D485 Neoplasm of uncertain behavior of skin: Secondary | ICD-10-CM | POA: Diagnosis not present

## 2015-10-25 DIAGNOSIS — Z9114 Patient's other noncompliance with medication regimen: Secondary | ICD-10-CM | POA: Diagnosis not present

## 2015-10-25 DIAGNOSIS — I63419 Cerebral infarction due to embolism of unspecified middle cerebral artery: Secondary | ICD-10-CM | POA: Diagnosis not present

## 2015-10-25 DIAGNOSIS — F411 Generalized anxiety disorder: Secondary | ICD-10-CM

## 2015-10-25 DIAGNOSIS — Z23 Encounter for immunization: Secondary | ICD-10-CM | POA: Diagnosis not present

## 2015-10-25 DIAGNOSIS — Z91148 Patient's other noncompliance with medication regimen for other reason: Secondary | ICD-10-CM

## 2015-10-25 DIAGNOSIS — F0630 Mood disorder due to known physiological condition, unspecified: Secondary | ICD-10-CM

## 2015-10-25 DIAGNOSIS — F0390 Unspecified dementia without behavioral disturbance: Secondary | ICD-10-CM

## 2015-10-25 DIAGNOSIS — F0392 Unspecified dementia, unspecified severity, with psychotic disturbance: Secondary | ICD-10-CM

## 2015-10-25 MED ORDER — MUPIROCIN 2 % EX OINT: TOPICAL_OINTMENT | CUTANEOUS | 0 refills | Status: DC

## 2015-10-25 NOTE — Assessment & Plan Note (Signed)
Better - discussed

## 2015-10-25 NOTE — Progress Notes (Signed)
Pre visit review using our clinic review tool, if applicable. No additional management support is needed unless otherwise documented below in the visit note. 

## 2015-10-25 NOTE — Assessment & Plan Note (Signed)
Better on Duloxetine

## 2015-10-25 NOTE — Addendum Note (Signed)
Addended by: Cresenciano Lick on: 10/25/2015 10:57 AM   Modules accepted: Orders

## 2015-10-25 NOTE — Progress Notes (Signed)
Subjective:  Patient ID: Alice Wong, female    DOB: May 17, 1934  Age: 81 y.o. MRN: [id number]  CC: No chief complaint on file.   HPI ANDREYA WINGROVE presents for diarrhea f/u - resolved on abx; dementia and confusion - better; wt loss - better - started to eat. C/o boil on L temple - worse  Outpatient Medications Prior to Visit  Medication Sig Dispense Refill  . amLODipine (NORVASC) 5 MG tablet TAKE ONE TABLET BY MOUTH ONCE DAILY 90 tablet 1  . aspirin EC 81 MG EC tablet Take 1 tablet (81 mg total) by mouth daily.    . Cholecalciferol 1000 UNITS tablet Take 1,000 Units by mouth daily.     . Cyanocobalamin (VITAMIN B-12) 1000 MCG SUBL Place 1 tablet under the tongue daily.      . diphenoxylate-atropine (LOMOTIL) 2.5-0.025 MG tablet Take 1 tablet by mouth 4 (four) times daily as needed for diarrhea or loose stools. 60 tablet 1  . DULoxetine (CYMBALTA) 60 MG capsule Take 1 capsule (60 mg total) by mouth daily. 90 capsule 0  . febuxostat (ULORIC) 40 MG tablet Take 1 tablet (40 mg total) by mouth daily. 30 tablet 2  . gabapentin (NEURONTIN) 300 MG capsule TAKE ONE CAPSULE BY MOUTH THREE TIMES DAILY 90 capsule 11  . losartan (COZAAR) 100 MG tablet Take 1 tablet (100 mg total) by mouth daily. 90 tablet 3  . Memantine HCl-Donepezil HCl (NAMZARIC) 28-10 MG CP24 Take 1 capsule by mouth daily. 30 capsule 11  . nitroGLYCERIN (NITROSTAT) 0.4 MG SL tablet Place 1 tablet (0.4 mg total) under the tongue every 5 (five) minutes as needed for chest pain. 20 tablet 3  . OLANZapine (ZYPREXA) 7.5 MG tablet TAKE ONE TABLET BY MOUTH AT BEDTIME 90 tablet 0  . ciprofloxacin (CIPRO) 250 MG tablet Take 1 tablet (250 mg total) by mouth 2 (two) times daily. 14 tablet 0  . cyclopentolate (CYCLODRYL,CYCLOGYL) 2 % ophthalmic solution Reported on 02/15/2015     No facility-administered medications prior to visit.     ROS Review of Systems  Constitutional: Negative for activity change, appetite change, chills,  fatigue and unexpected weight change.  HENT: Negative for congestion, mouth sores and sinus pressure.   Eyes: Negative for visual disturbance.  Respiratory: Negative for cough and chest tightness.   Gastrointestinal: Negative for abdominal pain and nausea.  Genitourinary: Negative for difficulty urinating, frequency and vaginal pain.  Musculoskeletal: Negative for back pain and gait problem.  Skin: Positive for wound. Negative for pallor and rash.  Neurological: Negative for dizziness, tremors, weakness, numbness and headaches.  Hematological: Negative for adenopathy.  Psychiatric/Behavioral: Positive for behavioral problems, dysphoric mood and sleep disturbance. Negative for agitation, confusion and hallucinations. The patient is nervous/anxious.     Objective:  BP 110/70   Pulse 76   Wt 160 lb (72.6 kg)   SpO2 96%   BMI 26.63 kg/m   BP Readings from Last 3 Encounters:  10/25/15 110/70  09/22/15 118/82  05/19/15 (!) 148/80    Wt Readings from Last 3 Encounters:  10/25/15 160 lb (72.6 kg)  09/22/15 147 lb (66.7 kg)  05/19/15 173 lb (78.5 kg)    Physical Exam  Constitutional: She appears well-developed. No distress.  HENT:  Head: Normocephalic.  Right Ear: External ear normal.  Left Ear: External ear normal.  Nose: Nose normal.  Mouth/Throat: Oropharynx is clear and moist.  Eyes: Conjunctivae are normal. Pupils are equal, round, and reactive to light. Right eye  exhibits no discharge. Left eye exhibits no discharge.  Neck: Normal range of motion. Neck supple. No JVD present. No tracheal deviation present. No thyromegaly present.  Cardiovascular: Normal rate, regular rhythm and normal heart sounds.   Pulmonary/Chest: No stridor. No respiratory distress. She has no wheezes.  Abdominal: Soft. Bowel sounds are normal. She exhibits no distension and no mass. There is no tenderness. There is no rebound and no guarding.  Musculoskeletal: She exhibits no edema or tenderness.    Lymphadenopathy:    She has no cervical adenopathy.  Neurological: She displays normal reflexes. No cranial nerve deficit. She exhibits normal muscle tone. Coordination normal.  Skin: Rash noted. No erythema.  Psychiatric: She has a normal mood and affect. Her behavior is normal. Thought content normal.  1.5x1 cm eryth raised ulcer on L temple  Lab Results  Component Value Date   WBC 9.1 09/22/2015   HGB 14.1 09/22/2015   HCT 40.3 09/22/2015   PLT 319.0 09/22/2015   GLUCOSE 96 09/22/2015   CHOL 166 08/23/2011   TRIG 115.0 08/23/2011   HDL 36.90 (L) 08/23/2011   LDLCALC 106 (H) 08/23/2011   ALT 13 09/22/2015   AST 15 09/22/2015   NA 136 09/22/2015   K 4.1 09/22/2015   CL 101 09/22/2015   CREATININE 0.91 09/22/2015   BUN 16 09/22/2015   CO2 28 09/22/2015   TSH 2.69 09/22/2015   INR 1.05 12/17/2013   HGBA1C 6.1 08/16/2014    Dg Chest 2 View  Result Date: 02/15/2015 CLINICAL DATA:  Hypertension. EXAM: CHEST  2 VIEW COMPARISON:  04/21/2014.  03/22/2014 . FINDINGS: Mediastinum hilar structures normal. Low lung volumes with mild bibasilar atelectasis. No prominent pleural effusion no pneumothorax. Diffuse osteopenia degenerative change. IMPRESSION: Low lung volumes with mild bibasilar atelectasis. Electronically Signed   By: Marcello Moores  Register   On: 02/15/2015 09:15    Assessment & Plan:   There are no diagnoses linked to this encounter. I have discontinued Ms. Reaney's ciprofloxacin. I am also having her maintain her Vitamin B-12, Cholecalciferol, aspirin, cyclopentolate, gabapentin, febuxostat, losartan, nitroGLYCERIN, Memantine HCl-Donepezil HCl, DULoxetine, amLODipine, diphenoxylate-atropine, and OLANZapine.  No orders of the defined types were placed in this encounter.    Follow-up: No Follow-up on file.  Walker Kehr, MD

## 2015-10-25 NOTE — Assessment & Plan Note (Addendum)
10/17 L temple - poss CA Derm consult Rx: bactroban oint

## 2015-10-25 NOTE — Assessment & Plan Note (Signed)
Better - cont Zyprexa

## 2015-11-23 ENCOUNTER — Ambulatory Visit (INDEPENDENT_AMBULATORY_CARE_PROVIDER_SITE_OTHER): Payer: Medicare Other | Admitting: Psychiatry

## 2015-11-23 ENCOUNTER — Encounter (HOSPITAL_COMMUNITY): Payer: Self-pay | Admitting: Psychiatry

## 2015-11-23 DIAGNOSIS — F0630 Mood disorder due to known physiological condition, unspecified: Secondary | ICD-10-CM | POA: Diagnosis not present

## 2015-11-23 DIAGNOSIS — I63419 Cerebral infarction due to embolism of unspecified middle cerebral artery: Secondary | ICD-10-CM

## 2015-11-23 DIAGNOSIS — Z79899 Other long term (current) drug therapy: Secondary | ICD-10-CM | POA: Diagnosis not present

## 2015-11-23 MED ORDER — DULOXETINE HCL 60 MG PO CPEP: 60.0000 mg | ORAL_CAPSULE | Freq: Every day | ORAL | 0 refills | Status: DC

## 2015-11-23 MED ORDER — OLANZAPINE 7.5 MG PO TABS: 7.5000 mg | ORAL_TABLET | Freq: Every day | ORAL | 0 refills | Status: DC

## 2015-11-23 NOTE — Progress Notes (Signed)
[facility] progress Note  Alice Wong [id number] 81 y.o.  11/23/2015 9:34 AM  Chief Complaint:  I have a headache.          History of Present Illness:  Alice Wong came for her follow-up appointment with the translator.  She usually comes with her daughter per daughter was not available today.  Most of the information was obtained through translator.  She mentioned that she spent some are in [place] after she lost her husband and her son.  She continues to grieve about the loss of her family members.  Currently she is living with her daughter in [place].  Despite recommendation to see counselor she has not scheduled that appointment yet.  She has somatic complaints mostly headaches, joint pain but also endorse some time feeling very sad and having crying spells.  However she does not want to change her medication because she feels they're helping her sleep and depression.  She admitted that she feels lonely and needs to keep herself busy by doing some volunteer work.  She denies any paranoia, hallucination, anger or any aggressive behavior.  She admitted medicine helping her anger and agitation.  Recently she's seen her primary care physician for headache and she had recommended to see a dermatologist for the lesion on her left temporal site.  Patient denies drinking or using any illegal substances.  Her appetite is okay.  Her energy level is okay.  Her vital signs are normal.  Suicidal Ideation: No Plan Formed: No Patient has means to carry out plan: No  Homicidal Ideation: No Plan Formed: No Patient has means to carry out plan: No  Past Psychiatric History/Hospitalization(s): Patient took Prozac from primary care physician for past 7-8 years for depression and anxiety symptoms.  She do not recall any inpatient psychiatric treatment or any suicidal attempt.  She admitted irritability, anger and mood swings but symptoms started to get worse in past few months.  Patient denies  any history of psychosis.  We tried Depakote 500 mg but patient did not see any improvement with the medication. Anxiety: Yes Bipolar Disorder: No Depression: Yes Mania: No Psychosis: No Schizophrenia: No Personality Disorder: No Hospitalization for psychiatric illness: No History of Electroconvulsive Shock Therapy: No Prior Suicide Attempts: No  Medical History; Patient has history of GERD, hyperlipidemia, hypertension, osteoporosis, cerebrovascular accident, cholelithiasis, moderate regurgitation, homicidal pia, memory impairment, arthritis, gout, vitamin D deficiency and neuropathy.  She also endorse history of syncopal episodes when she was in [place].  She remember having extensive neurology workup.  Her primary care physician is Dr Johnanna Schneiders.  Review of Systems  Constitutional: Negative.   Eyes: Negative.   Respiratory: Negative.   Cardiovascular: Negative.   Genitourinary: Negative.   Musculoskeletal: Positive for back pain and joint pain.  Neurological: Positive for headaches.  Endo/Heme/Allergies: Negative.    eview of Systems: Psychiatric: Agitation: No Hallucination: No Depressed Mood: Yes Insomnia: No Hypersomnia: No Altered Concentration: Yes Feels Worthless: No Grandiose Ideas: No Belief In Special Powers: No New/Increased Substance Abuse: No Compulsions: No  Neurologic: Headache: Yes Seizure: No Paresthesias: Yes   Outpatient Encounter Prescriptions as of 11/23/2015  Medication Sig  . amLODipine (NORVASC) 5 MG tablet TAKE ONE TABLET BY MOUTH ONCE DAILY  . aspirin EC 81 MG EC tablet Take 1 tablet (81 mg total) by mouth daily.  . Cholecalciferol 1000 UNITS tablet Take 1,000 Units by mouth daily.   . Cyanocobalamin (VITAMIN B-12) 1000 MCG SUBL Place 1 tablet under the tongue  daily.    . cyclopentolate (CYCLODRYL,CYCLOGYL) 2 % ophthalmic solution Reported on 02/15/2015  . diphenoxylate-atropine (LOMOTIL) 2.5-0.025 MG tablet Take 1 tablet by mouth 4 (four)  times daily as needed for diarrhea or loose stools.  . DULoxetine (CYMBALTA) 60 MG capsule Take 1 capsule (60 mg total) by mouth daily.  . febuxostat (ULORIC) 40 MG tablet Take 1 tablet (40 mg total) by mouth daily.  Marland Kitchen gabapentin (NEURONTIN) 300 MG capsule TAKE ONE CAPSULE BY MOUTH THREE TIMES DAILY  . losartan (COZAAR) 100 MG tablet Take 1 tablet (100 mg total) by mouth daily.  . Memantine HCl-Donepezil HCl (NAMZARIC) 28-10 MG CP24 Take 1 capsule by mouth daily.  . mupirocin ointment (BACTROBAN) 2 % Use bid  . nitroGLYCERIN (NITROSTAT) 0.4 MG SL tablet Place 1 tablet (0.4 mg total) under the tongue every 5 (five) minutes as needed for chest pain.  Marland Kitchen OLANZapine (ZYPREXA) 7.5 MG tablet Take 1 tablet (7.5 mg total) by mouth at bedtime.  . [DISCONTINUED] DULoxetine (CYMBALTA) 60 MG capsule Take 1 capsule (60 mg total) by mouth daily.  . [DISCONTINUED] OLANZapine (ZYPREXA) 7.5 MG tablet TAKE ONE TABLET BY MOUTH AT BEDTIME   No facility-administered encounter medications on file as of 11/23/2015.     Recent Results (from the past 2160 hour(s))  Basic metabolic panel     Status: None   Collection Time: 09/22/15  3:34 PM  Result Value Ref Range   Sodium 136 135 - 145 mEq/L   Potassium 4.1 3.5 - 5.1 mEq/L   Chloride 101 96 - 112 mEq/L   CO2 28 19 - 32 mEq/L   Glucose, Bld 96 70 - 99 mg/dL   BUN 16 6 - 23 mg/dL   Creatinine, Ser 0.91 0.40 - 1.20 mg/dL   Calcium 8.5 8.4 - 10.5 mg/dL   GFR 63.36 >60.00 mL/min  Hepatic function panel     Status: Abnormal   Collection Time: 09/22/15  3:34 PM  Result Value Ref Range   Total Bilirubin 0.5 0.2 - 1.2 mg/dL   Bilirubin, Direct 0.1 0.0 - 0.3 mg/dL   Alkaline Phosphatase 63 39 - 117 U/L   AST 15 0 - 37 U/L   ALT 13 0 - 35 U/L   Total Protein 5.4 (L) 6.0 - 8.3 g/dL   Albumin 3.1 (L) 3.5 - 5.2 g/dL  CBC with Differential/Platelet     Status: Abnormal   Collection Time: 09/22/15  3:34 PM  Result Value Ref Range   WBC 9.1 4.0 - 10.5 K/uL   RBC 4.49  3.87 - 5.11 Mil/uL   Hemoglobin 14.1 12.0 - 15.0 g/dL   HCT 40.3 36.0 - 46.0 %   MCV 89.9 78.0 - 100.0 fl   MCHC 34.8 30.0 - 36.0 g/dL   RDW 12.3 11.5 - 15.5 %   Platelets 319.0 150.0 - 400.0 K/uL   Neutrophils Relative % 80.4 (H) 43.0 - 77.0 %   Lymphocytes Relative 12.3 12.0 - 46.0 %   Monocytes Relative 6.6 3.0 - 12.0 %   Eosinophils Relative 0.4 0.0 - 5.0 %   Basophils Relative 0.3 0.0 - 3.0 %   Neutro Abs 7.3 1.4 - 7.7 K/uL   Lymphs Abs 1.1 0.7 - 4.0 K/uL   Monocytes Absolute 0.6 0.1 - 1.0 K/uL   Eosinophils Absolute 0.0 0.0 - 0.7 K/uL   Basophils Absolute 0.0 0.0 - 0.1 K/uL  TSH     Status: None   Collection Time: 09/22/15  3:34 PM  Result  Value Ref Range   TSH 2.69 0.35 - 4.50 uIU/mL  Stool, WBC/Lactoferrin     Status: None   Collection Time: 09/27/15 11:25 AM  Result Value Ref Range   Lactoferrin POSITIVE   C. difficile GDH and Toxin A/B     Status: None   Collection Time: 09/27/15 11:25 AM  Result Value Ref Range   C. difficile GDH Not Detected    C. difficile Toxin A/B Not Detected     Comment: No Toxigenic C. difficile Detected   Source Stool       Constitutional:  BP 118/78   Pulse 86   Ht 5\' 4"  (1.626 m)   Wt 166 lb 12.8 oz (75.7 kg)   BMI 28.63 kg/m    Musculoskeletal: Strength & Muscle Tone: decreased Gait & Station: normal Patient leans: N/A  Psychiatric Specialty Exam: General Appearance: Casual and Guarded  Eye Contact::  Fair  Speech:  NA  Volume:  Normal  Mood:  Dysphoric  Affect:  Depressed  Thought Process:  Coherent  Orientation:  Full (Time, Place, and Person)  Thought Content:  WDL  Suicidal Thoughts:  No  Homicidal Thoughts:  No  Memory:  Immediate;   Fair Recent;   Fair Remote;   Fair  Judgement:  Fair  Insight:  Fair  Psychomotor Activity:  Normal  Concentration:  Fair  Recall:  [employer] of Knowledge:  Fair  Language:  Poor  Akathisia:  No  Handed:  Right  AIMS (if indicated):     Assets:  Desire for  Improvement Financial Resources/Insurance Housing Social Support  ADL's:  Intact  Cognition:  Impaired,  Mild  Sleep:        Established Problem, Stable/Improving (1), Review of Psycho-Social Stressors (1), Review or order clinical lab tests (1), Review of Last Therapy Session (1) and Review of Medication Regimen & Side Effects (2)  Assessment: Axis I: Mood disorder due to general medical condition.  Depression due to memory impairment and other medical illnesses.  Depressive disorder NOS, cognitive impairment NOS, grief  Axis II: Deferred  Axis III:  Past Medical History:  Diagnosis Date  . Anxiety   . Cholelithiasis   . CVA (cerebral vascular accident) ([place])   . Depression   . Diverticulosis   . GERD (gastroesophageal reflux disease)   . Gout 2009  . H. pylori infection 2011  . Hyperlipidemia   . Hypertension   . Memory loss   . MR (mitral regurgitation)   . Osteoporosis   . S/P cardiac cath 12/20/13 12/20/2013  . Thrombocytopenia ([place])   . Thyroid cyst   . Vaso-vagal reaction   . Vitamin D deficiency      Plan:  I discussed patient's psychosocial stressors.  I do believe patient is having grief and should see a grief counselor.  Today her daughter is not with her at the appointment however she promised that she will discuss with the daughter and if agreed that she will call us to set up an appointment with a counselor at [facility] office.  At this time he does not have any side effects with the medication including any tremors, shakes, EPS.  I review blood work from her primary care physician which was done in September.  Her blood sugar is normal.  I will continue Cymbalta 60 mg daily and Zyprexa 7.5 mg at bedtime. I recommended to call us back if she feels worsening of the symptoms.  Follow-up in 3 months.   Discuss safety  plan that anytime having active suicidal thoughts or homicidal thoughts and she need to call 911 or go to the local emergency room.   Fahed Morten  T., MD 11/23/2015

## 2015-12-20 DIAGNOSIS — L821 Other seborrheic keratosis: Secondary | ICD-10-CM | POA: Diagnosis not present

## 2015-12-20 DIAGNOSIS — D485 Neoplasm of uncertain behavior of skin: Secondary | ICD-10-CM | POA: Diagnosis not present

## 2015-12-20 DIAGNOSIS — C44320 Squamous cell carcinoma of skin of unspecified parts of face: Secondary | ICD-10-CM | POA: Diagnosis not present

## 2015-12-27 ENCOUNTER — Ambulatory Visit: Payer: Self-pay | Admitting: Internal Medicine

## 2016-01-19 ENCOUNTER — Encounter: Payer: Self-pay | Admitting: Internal Medicine

## 2016-01-19 ENCOUNTER — Other Ambulatory Visit (INDEPENDENT_AMBULATORY_CARE_PROVIDER_SITE_OTHER): Payer: Medicare Other

## 2016-01-19 ENCOUNTER — Ambulatory Visit (INDEPENDENT_AMBULATORY_CARE_PROVIDER_SITE_OTHER): Payer: Medicare Other | Admitting: Internal Medicine

## 2016-01-19 DIAGNOSIS — F0630 Mood disorder due to known physiological condition, unspecified: Secondary | ICD-10-CM

## 2016-01-19 DIAGNOSIS — R55 Syncope and collapse: Secondary | ICD-10-CM

## 2016-01-19 DIAGNOSIS — I10 Essential (primary) hypertension: Secondary | ICD-10-CM | POA: Diagnosis not present

## 2016-01-19 DIAGNOSIS — D696 Thrombocytopenia, unspecified: Secondary | ICD-10-CM

## 2016-01-19 DIAGNOSIS — F068 Other specified mental disorders due to known physiological condition: Secondary | ICD-10-CM

## 2016-01-19 DIAGNOSIS — F0390 Unspecified dementia without behavioral disturbance: Secondary | ICD-10-CM

## 2016-01-19 DIAGNOSIS — F0392 Unspecified dementia, unspecified severity, with psychotic disturbance: Secondary | ICD-10-CM

## 2016-01-19 LAB — CBC WITH DIFFERENTIAL/PLATELET
Basophils Absolute: 0 10*3/uL (ref 0.0–0.1)
Basophils Relative: 0.3 % (ref 0.0–3.0)
Eosinophils Absolute: 0 10*3/uL (ref 0.0–0.7)
Eosinophils Relative: 0.6 % (ref 0.0–5.0)
HCT: 41.6 % (ref 36.0–46.0)
Hemoglobin: 14.7 g/dL (ref 12.0–15.0)
Lymphocytes Relative: 32.6 % (ref 12.0–46.0)
Lymphs Abs: 1.6 10*3/uL (ref 0.7–4.0)
MCHC: 35.4 g/dL (ref 30.0–36.0)
MCV: 89.7 fl (ref 78.0–100.0)
Monocytes Absolute: 0.4 10*3/uL (ref 0.1–1.0)
Monocytes Relative: 9.1 % (ref 3.0–12.0)
Neutro Abs: 2.8 10*3/uL (ref 1.4–7.7)
Neutrophils Relative %: 57.4 % (ref 43.0–77.0)
Platelets: 222 10*3/uL (ref 150.0–400.0)
RBC: 4.64 Mil/uL (ref 3.87–5.11)
RDW: 12.1 % (ref 11.5–15.5)
WBC: 4.9 10*3/uL (ref 4.0–10.5)

## 2016-01-19 LAB — BASIC METABOLIC PANEL
BUN: 20 mg/dL (ref 6–23)
CO2: 30 mEq/L (ref 19–32)
Calcium: 9.5 mg/dL (ref 8.4–10.5)
Chloride: 101 mEq/L (ref 96–112)
Creatinine, Ser: 1.14 mg/dL (ref 0.40–1.20)
GFR: 48.81 mL/min — ABNORMAL LOW (ref >60.00)
Glucose, Bld: 227 mg/dL — ABNORMAL HIGH (ref 70–99)
Potassium: 4.3 mEq/L (ref 3.5–5.1)
Sodium: 140 mEq/L (ref 135–145)

## 2016-01-19 LAB — HEPATIC FUNCTION PANEL
ALT: 24 U/L (ref 0–35)
AST: 21 U/L (ref 0–37)
Albumin: 4.2 g/dL (ref 3.5–5.2)
Alkaline Phosphatase: 74 U/L (ref 39–117)
Bilirubin, Direct: 0.1 mg/dL (ref 0.0–0.3)
Total Bilirubin: 0.5 mg/dL (ref 0.2–1.2)
Total Protein: 7.2 g/dL (ref 6.0–8.3)

## 2016-01-19 NOTE — Progress Notes (Signed)
Subjective:  Patient ID: Alice Wong, female    DOB: September 23, 1934  Age: 82 y.o. MRN: [id number]  CC: No chief complaint on file.   HPI LAURALYE STAPF presents for dementia, HTN, HAs f/u  Outpatient Medications Prior to Visit  Medication Sig Dispense Refill  . amLODipine (NORVASC) 5 MG tablet TAKE ONE TABLET BY MOUTH ONCE DAILY 90 tablet 1  . aspirin EC 81 MG EC tablet Take 1 tablet (81 mg total) by mouth daily.    . Cholecalciferol 1000 UNITS tablet Take 1,000 Units by mouth daily.     . Cyanocobalamin (VITAMIN B-12) 1000 MCG SUBL Place 1 tablet under the tongue daily.      . cyclopentolate (CYCLODRYL,CYCLOGYL) 2 % ophthalmic solution Reported on 02/15/2015    . diphenoxylate-atropine (LOMOTIL) 2.5-0.025 MG tablet Take 1 tablet by mouth 4 (four) times daily as needed for diarrhea or loose stools. 60 tablet 1  . DULoxetine (CYMBALTA) 60 MG capsule Take 1 capsule (60 mg total) by mouth daily. 90 capsule 0  . febuxostat (ULORIC) 40 MG tablet Take 1 tablet (40 mg total) by mouth daily. 30 tablet 2  . gabapentin (NEURONTIN) 300 MG capsule TAKE ONE CAPSULE BY MOUTH THREE TIMES DAILY 90 capsule 11  . losartan (COZAAR) 100 MG tablet Take 1 tablet (100 mg total) by mouth daily. 90 tablet 3  . Memantine HCl-Donepezil HCl (NAMZARIC) 28-10 MG CP24 Take 1 capsule by mouth daily. 30 capsule 11  . mupirocin ointment (BACTROBAN) 2 % Use bid 30 g 0  . nitroGLYCERIN (NITROSTAT) 0.4 MG SL tablet Place 1 tablet (0.4 mg total) under the tongue every 5 (five) minutes as needed for chest pain. 20 tablet 3  . OLANZapine (ZYPREXA) 7.5 MG tablet Take 1 tablet (7.5 mg total) by mouth at bedtime. 90 tablet 0   No facility-administered medications prior to visit.     ROS Review of Systems  Constitutional: Negative for activity change, appetite change, chills, fatigue and unexpected weight change.  HENT: Negative for congestion, mouth sores and sinus pressure.   Eyes: Negative for visual disturbance.    Respiratory: Negative for cough and chest tightness.   Gastrointestinal: Negative for abdominal pain and nausea.  Genitourinary: Negative for difficulty urinating, frequency and vaginal pain.  Musculoskeletal: Negative for back pain and gait problem.  Skin: Negative for pallor and rash.  Neurological: Negative for dizziness, tremors, weakness, numbness and headaches.  Psychiatric/Behavioral: Positive for agitation, behavioral problems, confusion, decreased concentration and suicidal ideas. Negative for sleep disturbance. The patient is nervous/anxious.     Objective:  BP 140/82   Pulse 72   Temp 98.3 F (36.8 C) (Oral)   Resp 20   Wt 172 lb (78 kg)   SpO2 96%   BMI 29.52 kg/m   BP Readings from Last 3 Encounters:  01/19/16 140/82  10/25/15 110/70  09/22/15 118/82    Wt Readings from Last 3 Encounters:  01/19/16 172 lb (78 kg)  10/25/15 160 lb (72.6 kg)  09/22/15 147 lb (66.7 kg)    Physical Exam  Constitutional: She appears well-developed. No distress.  HENT:  Head: Normocephalic.  Right Ear: External ear normal.  Left Ear: External ear normal.  Nose: Nose normal.  Mouth/Throat: Oropharynx is clear and moist.  Eyes: Conjunctivae are normal. Pupils are equal, round, and reactive to light. Right eye exhibits no discharge. Left eye exhibits no discharge.  Neck: Normal range of motion. Neck supple. No JVD present. No tracheal deviation present. No thyromegaly  present.  Cardiovascular: Normal rate, regular rhythm and normal heart sounds.   Pulmonary/Chest: No stridor. No respiratory distress. She has no wheezes.  Abdominal: Soft. Bowel sounds are normal. She exhibits no distension and no mass. There is no tenderness. There is no rebound and no guarding.  Musculoskeletal: She exhibits no edema or tenderness.  Lymphadenopathy:    She has no cervical adenopathy.  Neurological: She displays normal reflexes. No cranial nerve deficit. She exhibits normal muscle tone.  Coordination abnormal.  Skin: No rash noted. No erythema.  Confused L temple large growth  Lab Results  Component Value Date   WBC 9.1 09/22/2015   HGB 14.1 09/22/2015   HCT 40.3 09/22/2015   PLT 319.0 09/22/2015   GLUCOSE 96 09/22/2015   CHOL 166 08/23/2011   TRIG 115.0 08/23/2011   HDL 36.90 (L) 08/23/2011   LDLCALC 106 (H) 08/23/2011   ALT 13 09/22/2015   AST 15 09/22/2015   NA 136 09/22/2015   K 4.1 09/22/2015   CL 101 09/22/2015   CREATININE 0.91 09/22/2015   BUN 16 09/22/2015   CO2 28 09/22/2015   TSH 2.69 09/22/2015   INR 1.05 12/17/2013   HGBA1C 6.1 08/16/2014    Dg Chest 2 View  Result Date: 02/15/2015 CLINICAL DATA:  Hypertension. EXAM: CHEST  2 VIEW COMPARISON:  04/21/2014.  03/22/2014 . FINDINGS: Mediastinum hilar structures normal. Low lung volumes with mild bibasilar atelectasis. No prominent pleural effusion no pneumothorax. Diffuse osteopenia degenerative change. IMPRESSION: Low lung volumes with mild bibasilar atelectasis. Electronically Signed   By: Marcello Moores  Register   On: 02/15/2015 09:15    Assessment & Plan:   There are no diagnoses linked to this encounter. I am having Ms. Grell maintain her Vitamin B-12, Cholecalciferol, aspirin, cyclopentolate, gabapentin, febuxostat, losartan, nitroGLYCERIN, Memantine HCl-Donepezil HCl, amLODipine, diphenoxylate-atropine, mupirocin ointment, OLANZapine, and DULoxetine.  No orders of the defined types were placed in this encounter.    Follow-up: No Follow-up on file.  Walker Kehr, MD

## 2016-01-19 NOTE — Assessment & Plan Note (Signed)
Losartan, Amlodipine

## 2016-01-19 NOTE — Assessment & Plan Note (Signed)
Dr Adele Schilder Cont Zyprexa

## 2016-01-19 NOTE — Progress Notes (Signed)
Pre visit review using our clinic review tool, if applicable. No additional management support is needed unless otherwise documented below in the visit note. 

## 2016-01-19 NOTE — Assessment & Plan Note (Signed)
CBC

## 2016-01-19 NOTE — Assessment & Plan Note (Signed)
No relapse 

## 2016-02-06 DIAGNOSIS — C44329 Squamous cell carcinoma of skin of other parts of face: Secondary | ICD-10-CM | POA: Diagnosis not present

## 2016-02-09 IMAGING — DX DG CHEST 2V
2 series · 2 of 2 positions shown · non-contrast
Comparison: 03/22/2014

CLINICAL DATA: Chest pain and dyspnea

EXAM:
CHEST  2 VIEW

[chest lat]
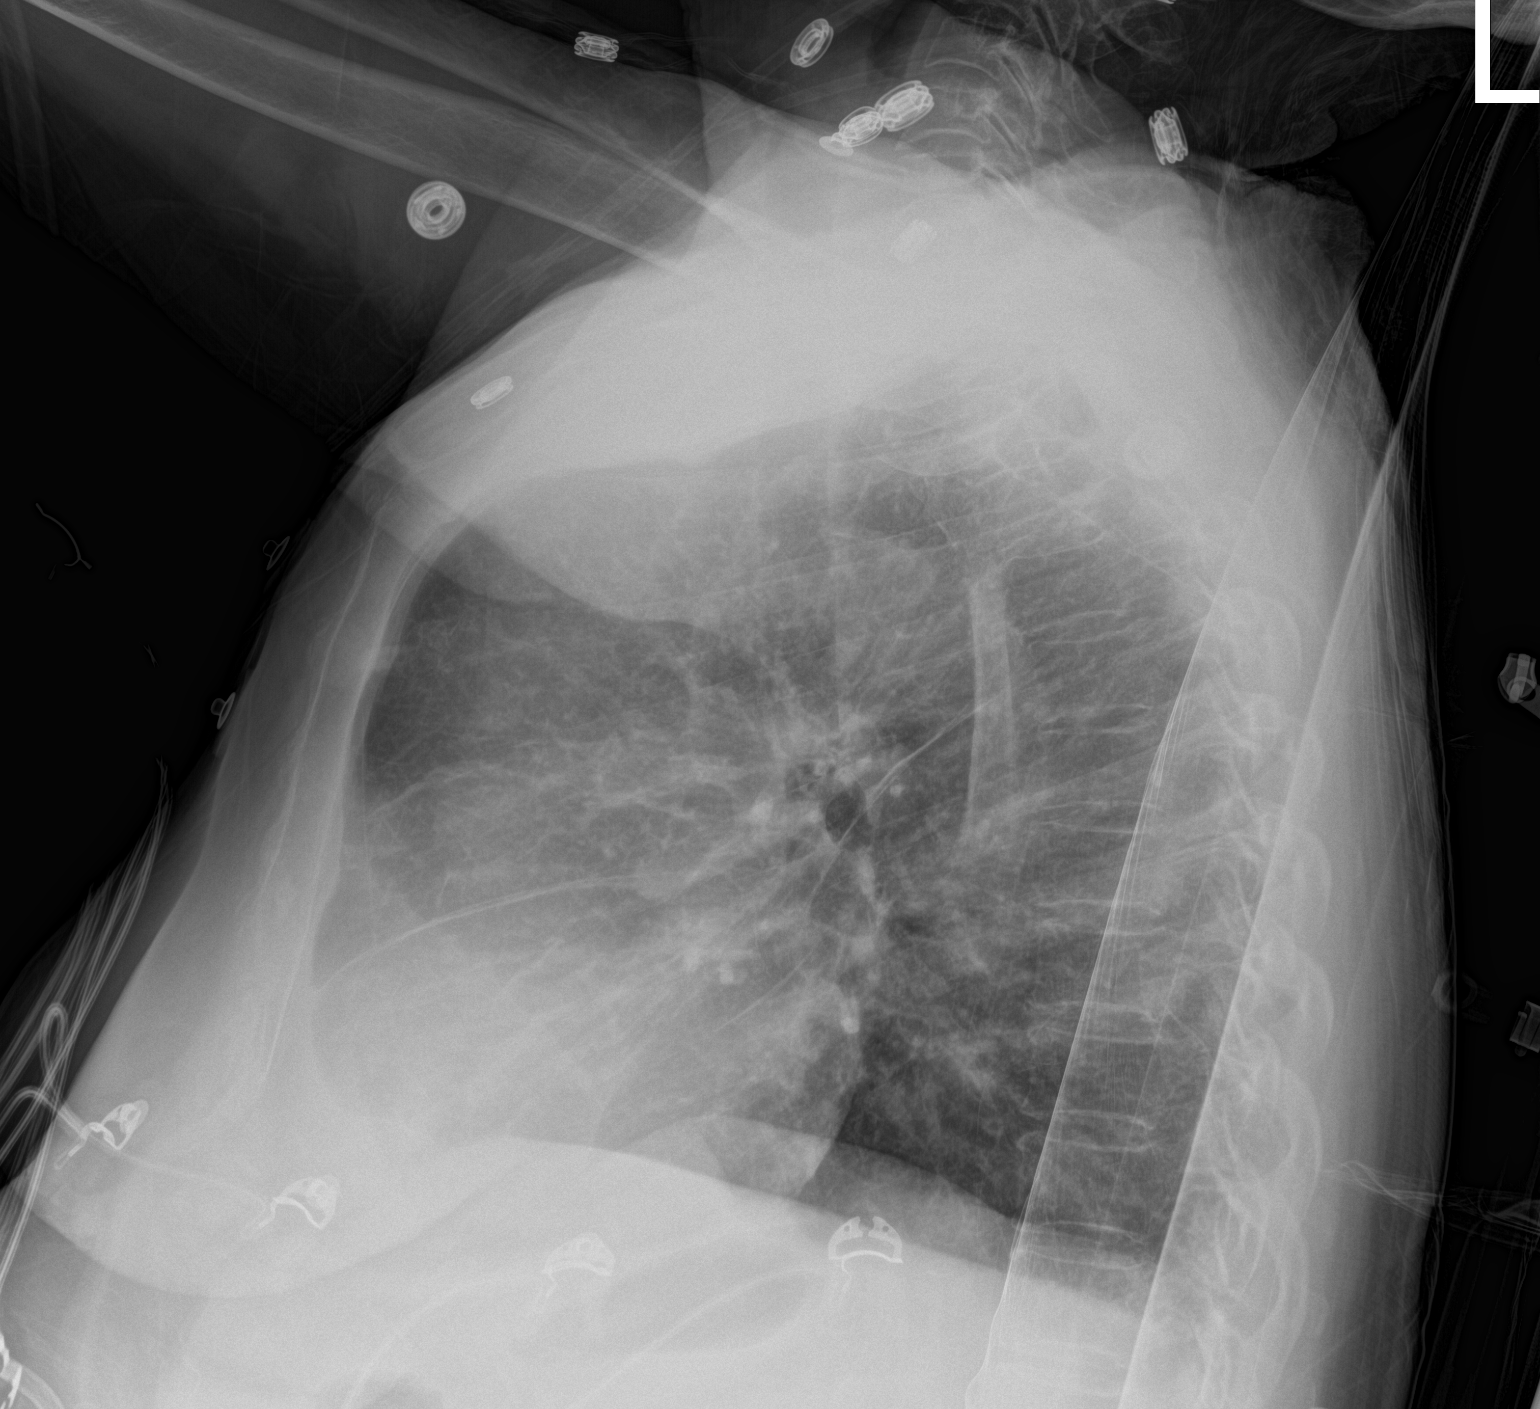

[chest ap]
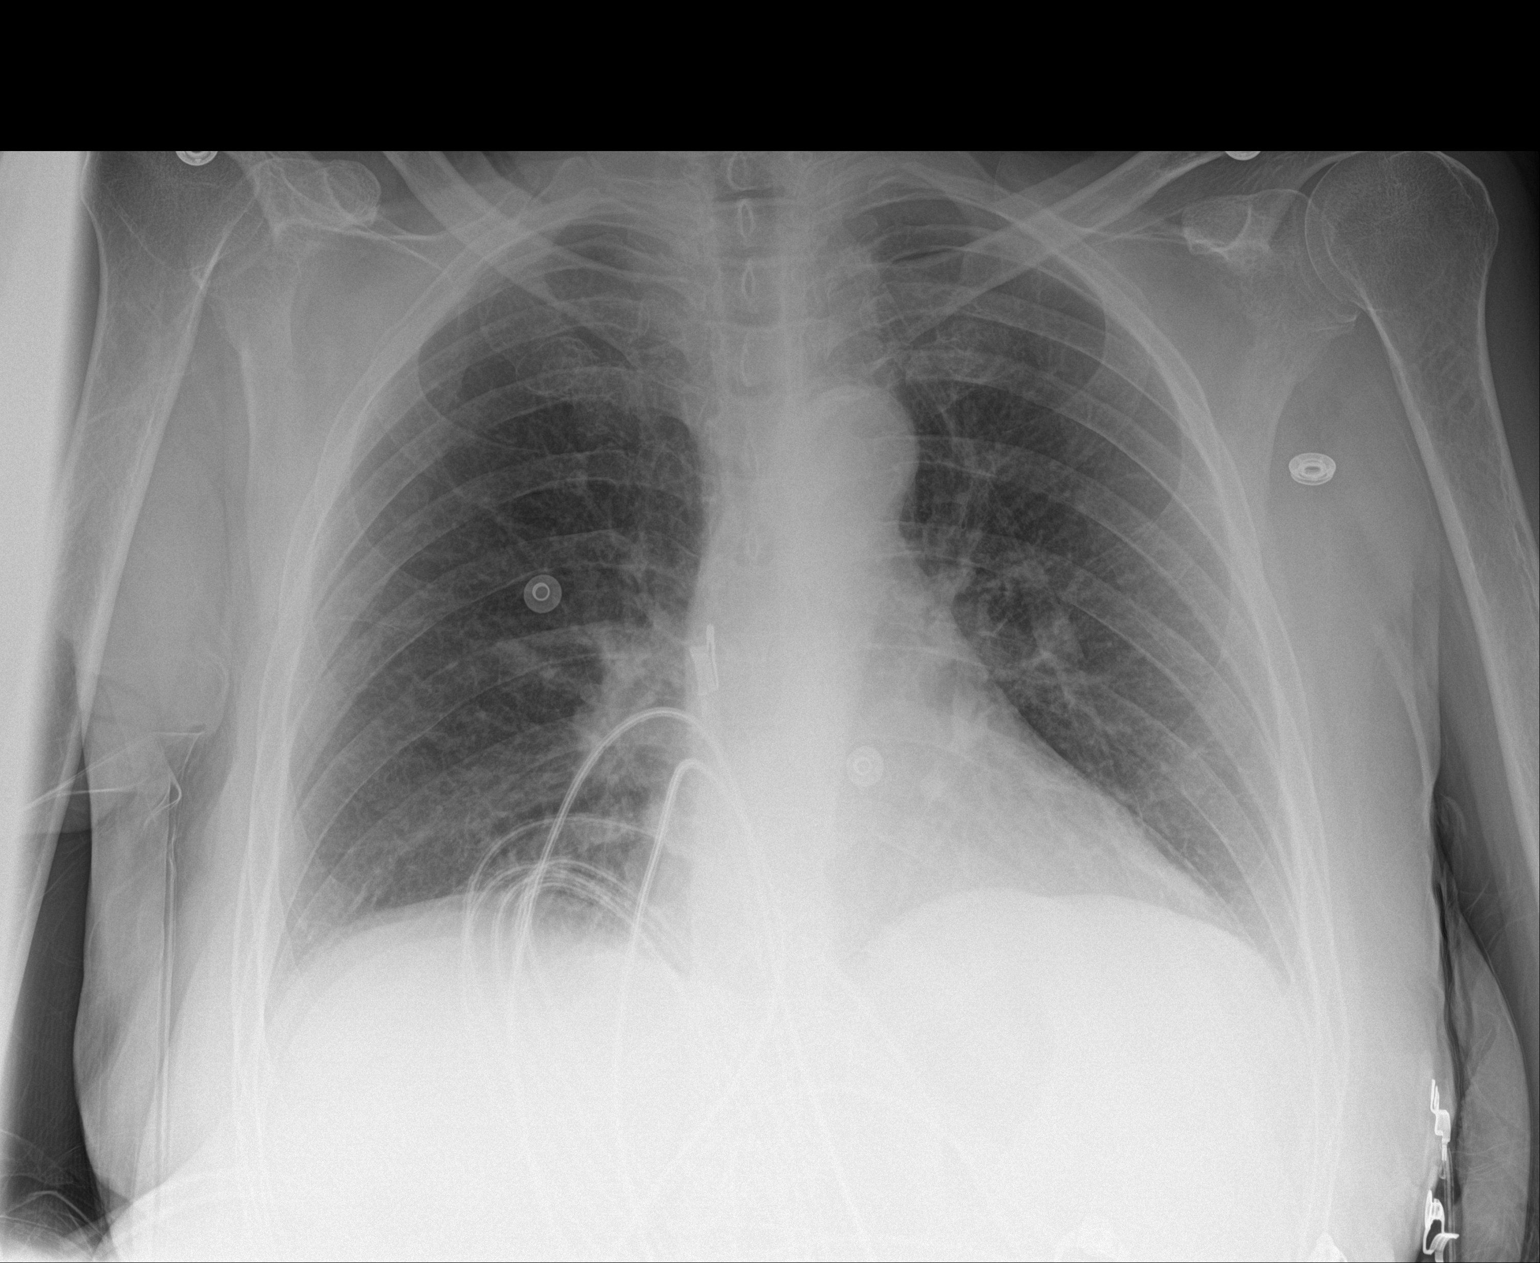

[2 of 2 positions shown; findings below may reference images not displayed]

FINDINGS: There is mild hyperinflation and cardiomegaly, unchanged. The lungs
are clear. There are no effusions. Pulmonary vasculature is normal.
IMPRESSION: Probable COPD.  No acute cardiopulmonary findings.

## 2016-02-09 IMAGING — CT CT HEAD W/O CM
2 series · 16 of 30 positions shown, 20 images · non-contrast
Comparison: 11/21/2012

CLINICAL DATA: Syncope

EXAM:
CT HEAD WITHOUT CONTRAST
TECHNIQUE: Contiguous axial images were obtained from the base of the skull
through the vertex without intravenous contrast.

[Series 201: head w/o, idose (1) · axial · non-contrast · 0.49mm/px · z∈[+403,+533]mm · 13 of 32 slices shown, 17 images]
[im 3/32  brain]
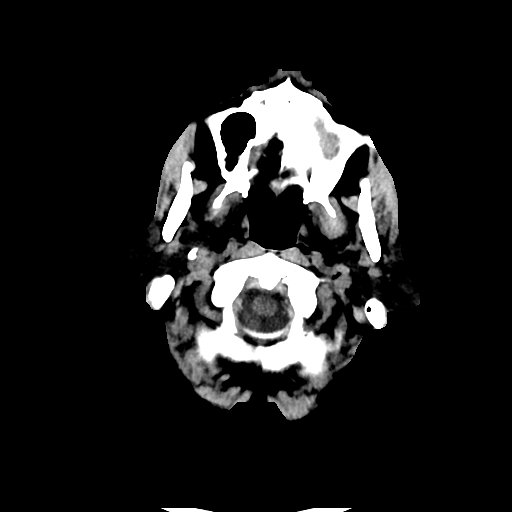
[im 3/32  bone]
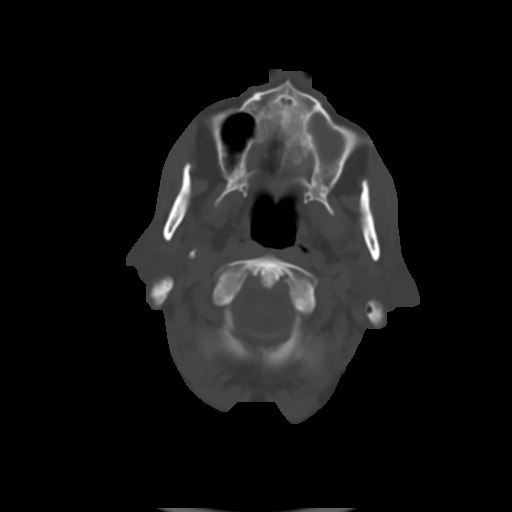
[im 5/32  brain]
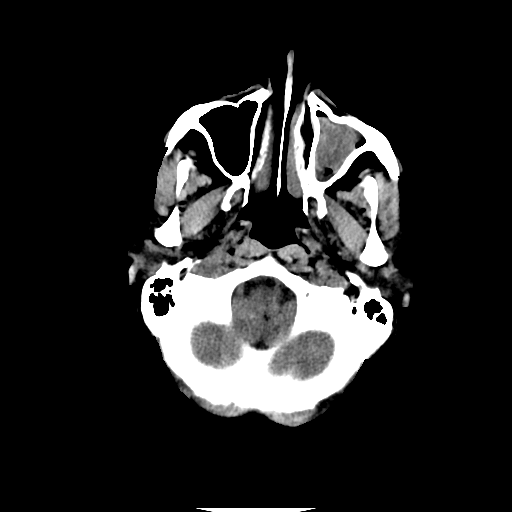
[im 7/32  brain]
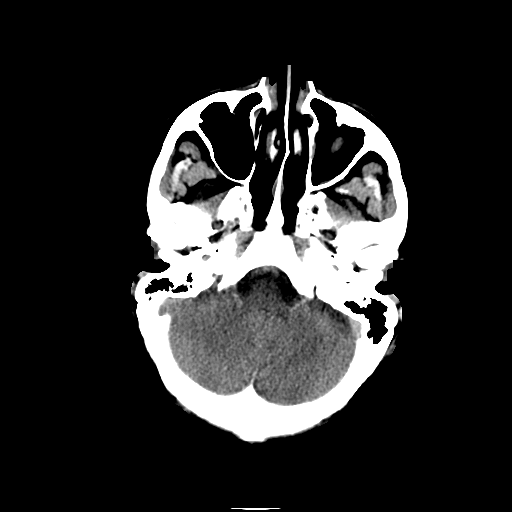
[im 9/32  brain]
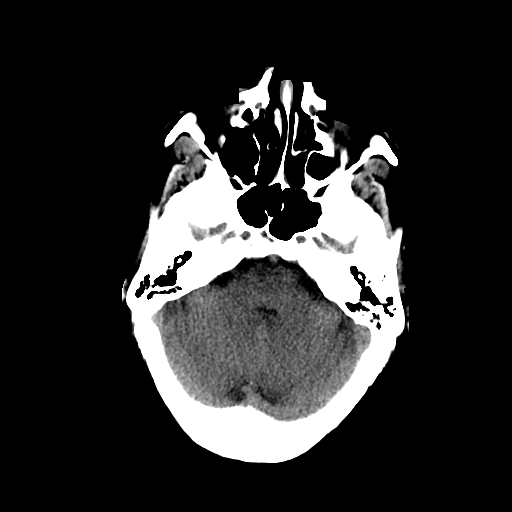
[im 12/32  brain]
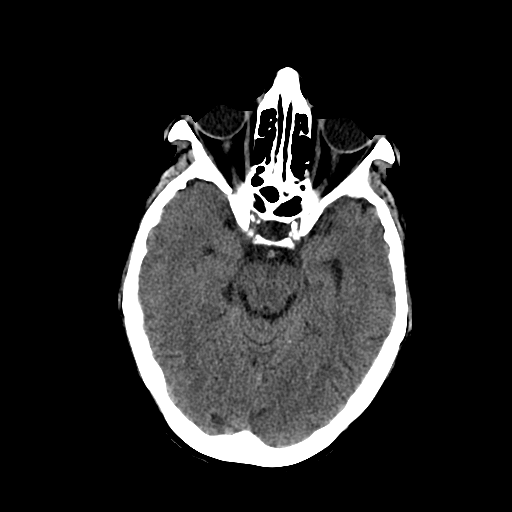
[im 12/32  bone]
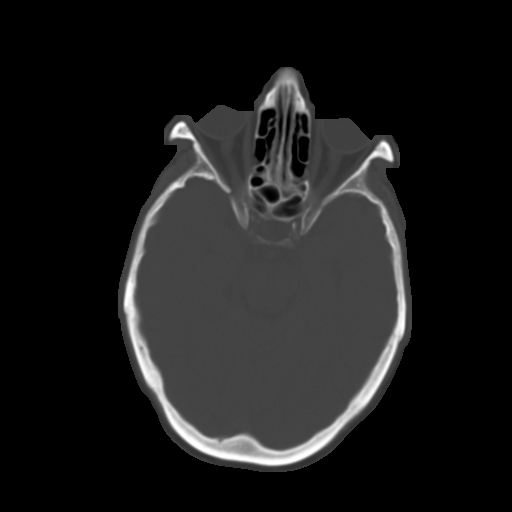
[im 14/32  brain]
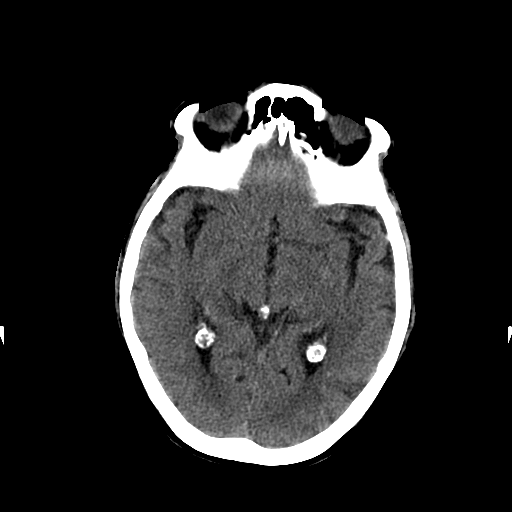
[im 16/32  brain]
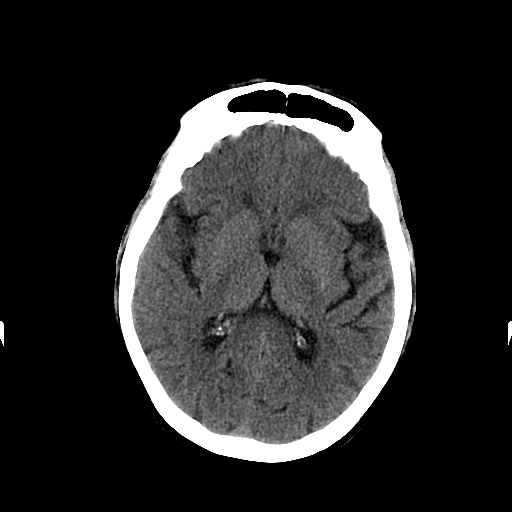
[im 18/32  brain]
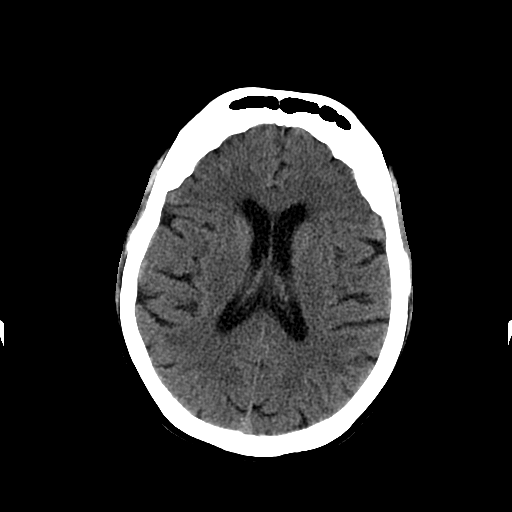
[im 20/32  brain]
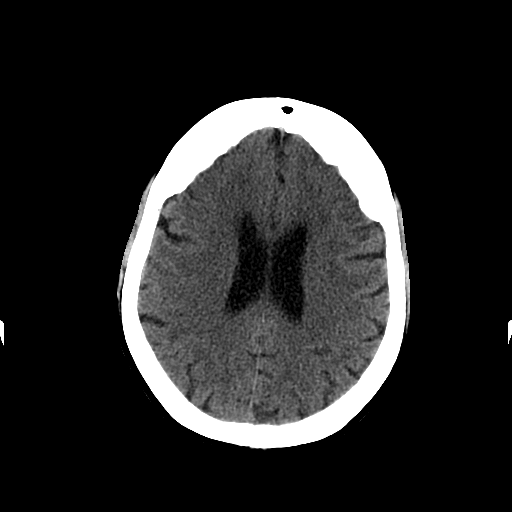
[im 20/32  bone]
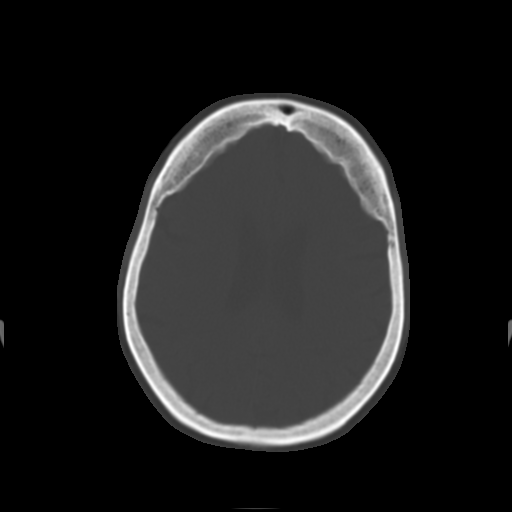
[im 23/32  brain]
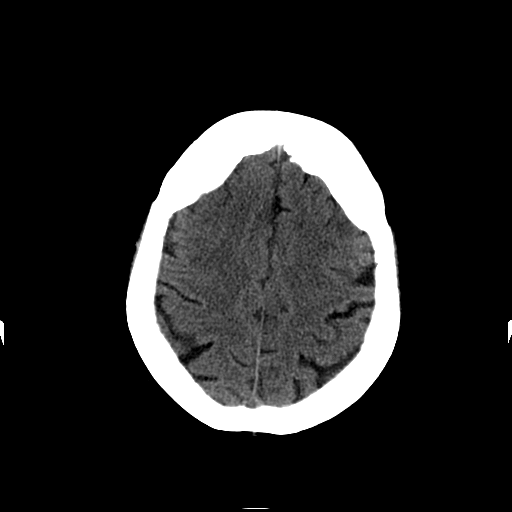
[im 25/32  brain]
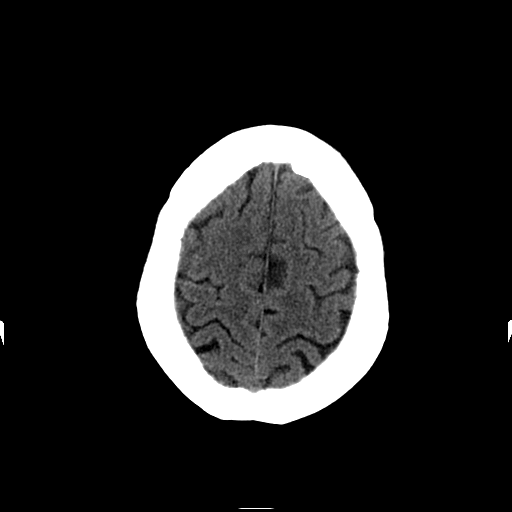
[im 27/32  brain]
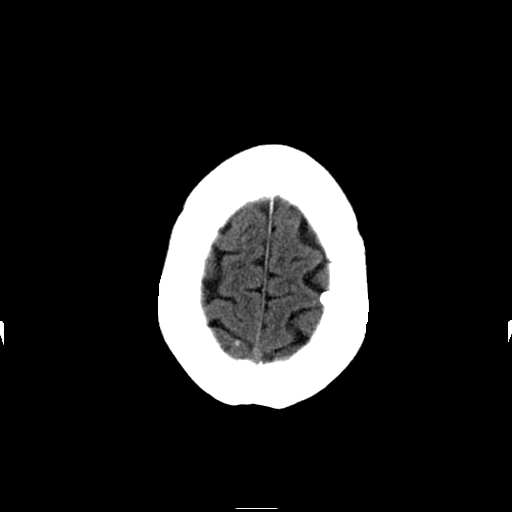
[im 29/32  brain]
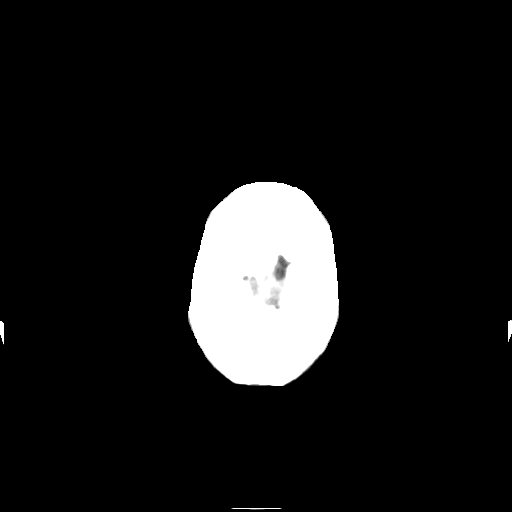
[im 29/32  bone]
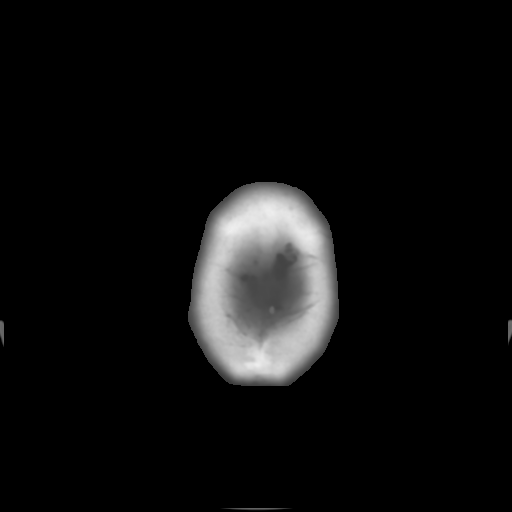

[Series 202: head w/o bone, idose (1) · axial · non-contrast · 0.49mm/px · z∈[+403,+448]mm · 3 of 32 slices shown]
[im 3/32  bone]
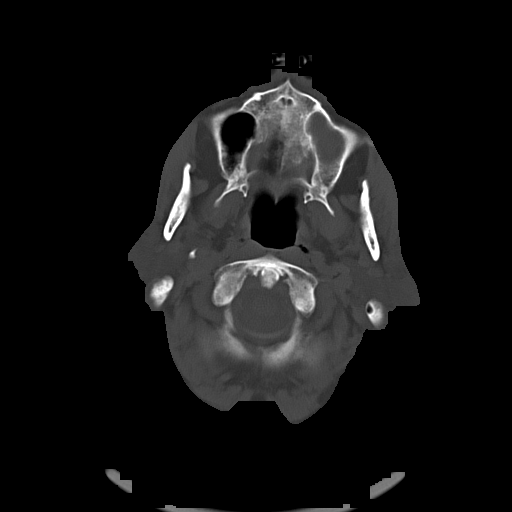
[im 7/32  bone]
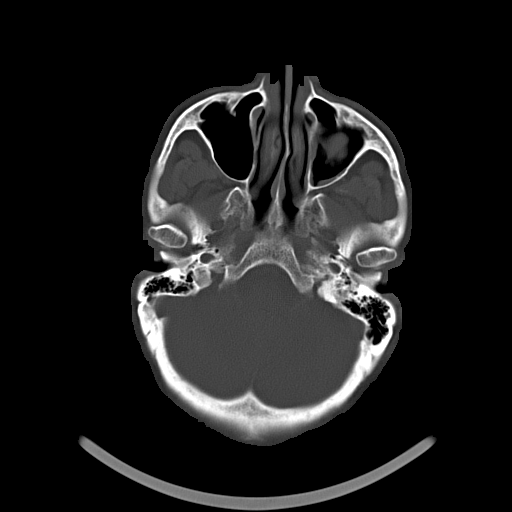
[im 12/32  bone]
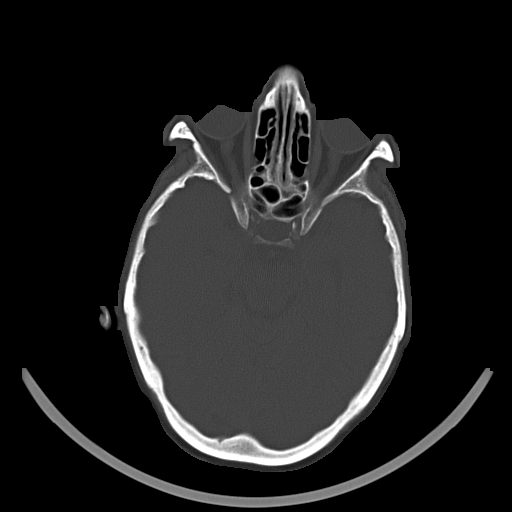

[16 of 30 positions shown; findings below may reference images not displayed]

FINDINGS: There is mild atrophy and mild periventricular hypodensity
consistent with chronic microvascular disease. There is no
intracranial hemorrhage, mass or evidence of acute infarction. There
is no extra-axial fluid collection. There is a large retention cyst
within the left maxillary sinus.
IMPRESSION: Mild atrophy and chronic microvascular ischemic disease.

## 2016-02-10 ENCOUNTER — Other Ambulatory Visit: Payer: Self-pay | Admitting: Internal Medicine

## 2016-02-27 ENCOUNTER — Encounter (HOSPITAL_COMMUNITY): Payer: Self-pay | Admitting: Psychiatry

## 2016-02-27 ENCOUNTER — Ambulatory Visit (INDEPENDENT_AMBULATORY_CARE_PROVIDER_SITE_OTHER): Payer: Medicare Other | Admitting: Psychiatry

## 2016-02-27 DIAGNOSIS — F0630 Mood disorder due to known physiological condition, unspecified: Secondary | ICD-10-CM | POA: Diagnosis not present

## 2016-02-27 DIAGNOSIS — Z8489 Family history of other specified conditions: Secondary | ICD-10-CM

## 2016-02-27 DIAGNOSIS — Z79899 Other long term (current) drug therapy: Secondary | ICD-10-CM

## 2016-02-27 DIAGNOSIS — Z800 Family history of malignant neoplasm of digestive organs: Secondary | ICD-10-CM

## 2016-02-27 DIAGNOSIS — Z888 Allergy status to other drugs, medicaments and biological substances status: Secondary | ICD-10-CM

## 2016-02-27 DIAGNOSIS — Z8249 Family history of ischemic heart disease and other diseases of the circulatory system: Secondary | ICD-10-CM | POA: Diagnosis not present

## 2016-02-27 DIAGNOSIS — Z7982 Long term (current) use of aspirin: Secondary | ICD-10-CM

## 2016-02-27 MED ORDER — OLANZAPINE 5 MG PO TABS: 5.0000 mg | ORAL_TABLET | Freq: Every day | ORAL | 0 refills | Status: DC

## 2016-02-27 MED ORDER — DULOXETINE HCL 60 MG PO CPEP: 60.0000 mg | ORAL_CAPSULE | Freq: Every day | ORAL | 0 refills | Status: DC

## 2016-02-27 NOTE — Progress Notes (Signed)
[place] MD/PA/NP OP Progress Note  02/27/2016 10:30 AM Alice Wong  MRN:  [id number]  Chief Complaint:    Subjective:  I'm doing better.    History of presenting illness; Ayat came for her follow-up appointment with her daughter and translator.  As per daughter sometimes she does not take Zyprexa at night and noticed irritable, having crying spells and angry episodes.  Overall she described that medicine working very well.  She continues to grieve about the loss of her son and husband who died last year.  She forgot to schedule appointment to see a therapist.  Daughter reported that she is less depressed overall but still feels some time isolated and withdrawn.  She is taking Zyprexa 7.5 mg at bedtime and Cymbalta 60 mg daily.  She's also getting Neurontin and other medication from her primary care physician.  She recently seen her primary care physician and her blood sugar was high.  Daughter endorse that she is snacking on cookies and sometimes does not take care of her diet.  At times it is difficult to discipline her.  Patient has memory issues.  Patient denies any suicidal thoughts or homicidal thought.  She has no tremors or shakes.  She endorse somatic complaining mostly headaches, joint pain but denies any feeling of hopelessness or worthlessness.  She has no paranoia or any delusions.  Her energy level is fair.  Her vital signs are okay.  Visit Diagnosis:    ICD-9-CM ICD-10-CM   1. Mood disorder in conditions classified elsewhere 293.83 F06.30 DULoxetine (CYMBALTA) 60 MG capsule     OLANZapine (ZYPREXA) 5 MG tablet    Past Psychiatric History: Reviewed. Patient took Prozac from primary care physician for past 7-8 years for depression and anxiety symptoms.  She do not recall any inpatient psychiatric treatment or any suicidal attempt.  She admitted irritability, anger and mood swings but symptoms started to get worse in past few months.  Patient denies any history of psychosis.  We tried  Depakote 500 mg but patient did not see any improvement with the medication.  Past Medical History:  Past Medical History:  Diagnosis Date  . Anxiety   . Cholelithiasis   . CVA (cerebral vascular accident) ([place])   . Depression   . Diverticulosis   . GERD (gastroesophageal reflux disease)   . Gout 2009  . H. pylori infection 2011  . Hyperlipidemia   . Hypertension   . Memory loss   . MR (mitral regurgitation)   . Osteoporosis   . S/P cardiac cath 12/20/13 12/20/2013  . Thrombocytopenia ([place])   . Thyroid cyst   . Vaso-vagal reaction   . Vitamin D deficiency     Past Surgical History:  Procedure Laterality Date  . CESAREAN SECTION    . CHOLECYSTECTOMY  2010  . LASIK    . LEFT HEART CATHETERIZATION WITH CORONARY ANGIOGRAM N/A 12/20/2013   Procedure: LEFT HEART CATHETERIZATION WITH CORONARY ANGIOGRAM;  Surgeon: Burnell Blanks, MD;  Location: [facility] CATH LAB;  Service: Cardiovascular;  Laterality: N/A;    Family Psychiatric History: Reviewed  Family History:  Family History  Problem Relation Age of Onset  . Hypertension Other   . Stomach cancer Brother   . Ulcers Son   . Colon cancer Neg Hx   . Lung disease Father     Deceased    Social History:  Social History   Social History  . Marital status: Widowed    Spouse name: N/A  . Number of  children: 3  . Years of education: N/A   Occupational History  . RETIRED    Social History Main Topics  . Smoking status: Never Smoker  . Smokeless tobacco: Never Used  . Alcohol use No  . Drug use: No  . Sexual activity: No   Other Topics Concern  . None   Social History Narrative   Family History Hypertension   No caffeine    Lives with husband in a 2 story home.  Retired.    Moved from [place] in 2002    Allergies:  Allergies  Allergen Reactions  . Aricept [Donepezil Hcl]     ?dry tongue  . Coreg [Carvedilol]     ?problems  . Morphine Sulfate Other (See Comments)    REACTION: decreased blood pressure     Metabolic Disorder Labs: Recent Results (from the past 2160 hour(s))  Basic metabolic panel     Status: Abnormal   Collection Time: 01/19/16  8:15 AM  Result Value Ref Range   Sodium 140 135 - 145 mEq/L   Potassium 4.3 3.5 - 5.1 mEq/L   Chloride 101 96 - 112 mEq/L   CO2 30 19 - 32 mEq/L   Glucose, Bld 227 (H) 70 - 99 mg/dL   BUN 20 6 - 23 mg/dL   Creatinine, Ser 1.14 0.40 - 1.20 mg/dL   Calcium 9.5 8.4 - 10.5 mg/dL   GFR 48.81 (L) >60.00 mL/min  CBC with Differential/Platelet     Status: None   Collection Time: 01/19/16  8:15 AM  Result Value Ref Range   WBC 4.9 4.0 - 10.5 K/uL   RBC 4.64 3.87 - 5.11 Mil/uL   Hemoglobin 14.7 12.0 - 15.0 g/dL   HCT 41.6 36.0 - 46.0 %   MCV 89.7 78.0 - 100.0 fl   MCHC 35.4 30.0 - 36.0 g/dL   RDW 12.1 11.5 - 15.5 %   Platelets 222.0 150.0 - 400.0 K/uL   Neutrophils Relative % 57.4 43.0 - 77.0 %   Lymphocytes Relative 32.6 12.0 - 46.0 %   Monocytes Relative 9.1 3.0 - 12.0 %   Eosinophils Relative 0.6 0.0 - 5.0 %   Basophils Relative 0.3 0.0 - 3.0 %   Neutro Abs 2.8 1.4 - 7.7 K/uL   Lymphs Abs 1.6 0.7 - 4.0 K/uL   Monocytes Absolute 0.4 0.1 - 1.0 K/uL   Eosinophils Absolute 0.0 0.0 - 0.7 K/uL   Basophils Absolute 0.0 0.0 - 0.1 K/uL  Hepatic function panel     Status: None   Collection Time: 01/19/16  8:15 AM  Result Value Ref Range   Total Bilirubin 0.5 0.2 - 1.2 mg/dL   Bilirubin, Direct 0.1 0.0 - 0.3 mg/dL   Alkaline Phosphatase 74 39 - 117 U/L   AST 21 0 - 37 U/L   ALT 24 0 - 35 U/L   Total Protein 7.2 6.0 - 8.3 g/dL   Albumin 4.2 3.5 - 5.2 g/dL   Lab Results  Component Value Date   HGBA1C 6.1 08/16/2014   No results found for: PROLACTIN Lab Results  Component Value Date   CHOL 166 08/23/2011   TRIG 115.0 08/23/2011   HDL 36.90 (L) 08/23/2011   CHOLHDL 4 08/23/2011   VLDL 23.0 08/23/2011   LDLCALC 106 (H) 08/23/2011   LDLCALC 109 (H) 01/02/2010     Current Medications: Current Outpatient Prescriptions  Medication  Sig Dispense Refill  . amLODipine (NORVASC) 5 MG tablet TAKE ONE TABLET BY MOUTH ONCE DAILY  90 tablet 1  . aspirin EC 81 MG EC tablet Take 1 tablet (81 mg total) by mouth daily.    . Cholecalciferol 1000 UNITS tablet Take 1,000 Units by mouth daily.     . Cyanocobalamin (VITAMIN B-12) 1000 MCG SUBL Place 1 tablet under the tongue daily.      . cyclopentolate (CYCLODRYL,CYCLOGYL) 2 % ophthalmic solution Reported on 02/15/2015    . diphenoxylate-atropine (LOMOTIL) 2.5-0.025 MG tablet Take 1 tablet by mouth 4 (four) times daily as needed for diarrhea or loose stools. 60 tablet 1  . DULoxetine (CYMBALTA) 60 MG capsule Take 1 capsule (60 mg total) by mouth daily. 90 capsule 0  . febuxostat (ULORIC) 40 MG tablet Take 1 tablet (40 mg total) by mouth daily. 30 tablet 2  . gabapentin (NEURONTIN) 300 MG capsule TAKE ONE CAPSULE BY MOUTH THREE TIMES DAILY 90 capsule 11  . losartan (COZAAR) 100 MG tablet Take 1 tablet (100 mg total) by mouth daily. 90 tablet 3  . Memantine HCl-Donepezil HCl (NAMZARIC) 28-10 MG CP24 Take 1 capsule by mouth daily. 30 capsule 11  . mupirocin ointment (BACTROBAN) 2 % Use bid 30 g 0  . nitroGLYCERIN (NITROSTAT) 0.4 MG SL tablet PLACE ONE TABLET UNDER THE TONGUE EVERY 5 MINUTES AS NEEDED FOR CHEST PAIN. 25 tablet 0  . OLANZapine (ZYPREXA) 7.5 MG tablet Take 1 tablet (7.5 mg total) by mouth at bedtime. 90 tablet 0   No current facility-administered medications for this visit.     Neurologic: Headache: Yes Seizure: No Paresthesias: No  Musculoskeletal: Strength & Muscle Tone: within normal limits Gait & Station: normal Patient leans: N/A  Psychiatric Specialty Exam: Review of Systems  Constitutional: Negative.   HENT: Negative.   Respiratory: Negative.   Cardiovascular: Negative for chest pain and palpitations.  Musculoskeletal: Positive for back pain and joint pain.  Skin: Negative for itching and rash.  Neurological: Positive for headaches.  Psychiatric/Behavioral:  Positive for memory loss.    Blood pressure 126/72, pulse 80, height 5\' 4"  (1.626 m), weight 177 lb 12.8 oz (80.6 kg).Body mass index is 30.52 kg/m.  General Appearance: Casual  Eye Contact:  Fair  Speech:  Slow  Volume:  Decreased  Mood:  Anxious  Affect:  Congruent  Thought Process:  Descriptions of Associations: Intact  Orientation:  Full (Time, Place, and Person)  Thought Content: Poverty of thought content were no delusions or any paranoia.   Suicidal Thoughts:  No  Homicidal Thoughts:  No  Memory:  Immediate;   Fair Recent;   Poor Remote;   Fair  Judgement:  Fair  Insight:  Fair  Psychomotor Activity:  Decreased  Concentration:  Concentration: Poor and Attention Span: Fair  Recall:  [employer] of Knowledge: Fair  Language: Fair  Akathisia:  No  Handed:  Right  AIMS (if indicated):  0  Assets:  Desire for Improvement Housing Social Support  ADL's:  Intact  Cognition: Impaired,  Mild  Sleep:  fair   Assessment: Mood disorder due to general medical condition.  Cognitive disorder NOS.  Depressive disorder NOS.  Grief  Plan: I review her primary care physician's records , blood work results and current medication.  Her blood sugar is increased.  I recommended to decrease Zyprexa 5 mg to help her lowering blood sugar.  I also discuss that she should consider seeing grief counseling at [facility] office.  Patient was informed through translator and she acknowledged and agreed to see a therapist for grief counseling.  Discuss to watch her calorie intake especially to avoid snacking on cookies.  Overall her mood has been stable.  We will continue Cymbalta 60 mg and new dose of Zyprexa 5 mg at bedtime.  Recommended to call us back if she has any question, concern if she feels worsening of the symptom.  Follow-up in 3 months   Oris Calmes T., MD 02/27/2016, 10:30 AM

## 2016-05-19 ENCOUNTER — Other Ambulatory Visit: Payer: Self-pay | Admitting: Internal Medicine

## 2016-05-19 ENCOUNTER — Other Ambulatory Visit (HOSPITAL_COMMUNITY): Payer: Self-pay | Admitting: Psychiatry

## 2016-05-19 DIAGNOSIS — F0630 Mood disorder due to known physiological condition, unspecified: Secondary | ICD-10-CM

## 2016-05-20 ENCOUNTER — Encounter: Payer: Self-pay | Admitting: Internal Medicine

## 2016-05-20 ENCOUNTER — Other Ambulatory Visit (INDEPENDENT_AMBULATORY_CARE_PROVIDER_SITE_OTHER): Payer: Medicare Other

## 2016-05-20 ENCOUNTER — Ambulatory Visit (INDEPENDENT_AMBULATORY_CARE_PROVIDER_SITE_OTHER): Payer: Medicare Other | Admitting: Internal Medicine

## 2016-05-20 VITALS — BP 140/90 | HR 74 | Temp 98.3°F | Ht 64.0 in | Wt 172.1 lb

## 2016-05-20 DIAGNOSIS — F0392 Unspecified dementia, unspecified severity, with psychotic disturbance: Secondary | ICD-10-CM

## 2016-05-20 DIAGNOSIS — Z23 Encounter for immunization: Secondary | ICD-10-CM | POA: Diagnosis not present

## 2016-05-20 DIAGNOSIS — I63419 Cerebral infarction due to embolism of unspecified middle cerebral artery: Secondary | ICD-10-CM

## 2016-05-20 DIAGNOSIS — R269 Unspecified abnormalities of gait and mobility: Secondary | ICD-10-CM | POA: Diagnosis not present

## 2016-05-20 DIAGNOSIS — F0630 Mood disorder due to known physiological condition, unspecified: Secondary | ICD-10-CM

## 2016-05-20 DIAGNOSIS — R413 Other amnesia: Secondary | ICD-10-CM

## 2016-05-20 DIAGNOSIS — E538 Deficiency of other specified B group vitamins: Secondary | ICD-10-CM | POA: Diagnosis not present

## 2016-05-20 DIAGNOSIS — F0390 Unspecified dementia without behavioral disturbance: Secondary | ICD-10-CM

## 2016-05-20 DIAGNOSIS — I10 Essential (primary) hypertension: Secondary | ICD-10-CM

## 2016-05-20 DIAGNOSIS — E559 Vitamin D deficiency, unspecified: Secondary | ICD-10-CM

## 2016-05-20 DIAGNOSIS — F068 Other specified mental disorders due to known physiological condition: Secondary | ICD-10-CM

## 2016-05-20 LAB — BASIC METABOLIC PANEL
BUN: 16 mg/dL (ref 6–23)
CO2: 26 mEq/L (ref 19–32)
Calcium: 9.5 mg/dL (ref 8.4–10.5)
Chloride: 98 mEq/L (ref 96–112)
Creatinine, Ser: 0.96 mg/dL (ref 0.40–1.20)
GFR: 59.47 mL/min — ABNORMAL LOW (ref >60.00)
Glucose, Bld: 455 mg/dL — ABNORMAL HIGH (ref 70–99)
Potassium: 3.8 mEq/L (ref 3.5–5.1)
Sodium: 135 mEq/L (ref 135–145)

## 2016-05-20 LAB — VITAMIN B12: Vitamin B-12: 429 pg/mL (ref 211–911)

## 2016-05-20 NOTE — Assessment & Plan Note (Signed)
Namzaric 

## 2016-05-20 NOTE — Assessment & Plan Note (Signed)
On ASA 

## 2016-05-20 NOTE — Assessment & Plan Note (Signed)
Losartan and amlodipine 

## 2016-05-20 NOTE — Progress Notes (Signed)
Subjective:  Patient ID: Alice Wong, female    DOB: 12-30-1934  Age: 82 y.o. MRN: [id number]  CC: No chief complaint on file.   HPI STALEY BUDZINSKI presents for HTN, HAs, dementia f/u. Doing fair  Outpatient Medications Prior to Visit  Medication Sig Dispense Refill  . amLODipine (NORVASC) 5 MG tablet TAKE ONE TABLET BY MOUTH ONCE DAILY 90 tablet 1  . aspirin EC 81 MG EC tablet Take 1 tablet (81 mg total) by mouth daily.    . Cholecalciferol 1000 UNITS tablet Take 1,000 Units by mouth daily.     . Cyanocobalamin (VITAMIN B-12) 1000 MCG SUBL Place 1 tablet under the tongue daily.      . diphenoxylate-atropine (LOMOTIL) 2.5-0.025 MG tablet Take 1 tablet by mouth 4 (four) times daily as needed for diarrhea or loose stools. 60 tablet 1  . DULoxetine (CYMBALTA) 60 MG capsule Take 1 capsule (60 mg total) by mouth daily. 90 capsule 0  . febuxostat (ULORIC) 40 MG tablet Take 1 tablet (40 mg total) by mouth daily. 30 tablet 2  . losartan (COZAAR) 100 MG tablet Take 1 tablet (100 mg total) by mouth daily. 90 tablet 3  . Memantine HCl-Donepezil HCl (NAMZARIC) 28-10 MG CP24 Take 1 capsule by mouth daily. 30 capsule 11  . nitroGLYCERIN (NITROSTAT) 0.4 MG SL tablet PLACE ONE TABLET UNDER THE TONGUE EVERY 5 MINUTES AS NEEDED FOR CHEST PAIN. 25 tablet 0  . OLANZapine (ZYPREXA) 5 MG tablet Take 1 tablet (5 mg total) by mouth at bedtime. 90 tablet 0  . cyclopentolate (CYCLODRYL,CYCLOGYL) 2 % ophthalmic solution Reported on 02/15/2015    . gabapentin (NEURONTIN) 300 MG capsule TAKE ONE CAPSULE BY MOUTH THREE TIMES DAILY 90 capsule 11  . mupirocin ointment (BACTROBAN) 2 % Use bid 30 g 0   No facility-administered medications prior to visit.     ROS Review of Systems  Constitutional: Positive for fatigue. Negative for activity change, appetite change, chills and unexpected weight change.  HENT: Positive for tinnitus. Negative for congestion, mouth sores and sinus pressure.   Eyes: Negative for  visual disturbance.  Respiratory: Negative for cough, chest tightness and shortness of breath.   Cardiovascular: Negative for chest pain.  Gastrointestinal: Negative for abdominal pain and nausea.  Genitourinary: Negative for difficulty urinating, frequency and vaginal pain.  Musculoskeletal: Negative for back pain and gait problem.  Skin: Negative for pallor and rash.  Neurological: Positive for dizziness and headaches. Negative for tremors, weakness and numbness.  Psychiatric/Behavioral: Positive for behavioral problems, confusion, decreased concentration and dysphoric mood. Negative for sleep disturbance and suicidal ideas. The patient is nervous/anxious.     Objective:  BP (!) 168/92 (BP Location: Left Arm, Patient Position: Sitting, Cuff Size: Normal)   Pulse 74   Temp 98.3 F (36.8 C) (Oral)   Ht 5\' 4"  (1.626 m)   Wt 172 lb 1.3 oz (78.1 kg)   SpO2 98%   BMI 29.54 kg/m   BP Readings from Last 3 Encounters:  05/20/16 (!) 168/92  01/19/16 140/82  10/25/15 110/70    Wt Readings from Last 3 Encounters:  05/20/16 172 lb 1.3 oz (78.1 kg)  01/19/16 172 lb (78 kg)  10/25/15 160 lb (72.6 kg)    Physical Exam  Constitutional: She appears well-developed. No distress.  HENT:  Head: Normocephalic.  Right Ear: External ear normal.  Left Ear: External ear normal.  Nose: Nose normal.  Mouth/Throat: Oropharynx is clear and moist.  Eyes: Conjunctivae are normal.  Pupils are equal, round, and reactive to light. Right eye exhibits no discharge. Left eye exhibits no discharge.  Neck: Normal range of motion. Neck supple. No JVD present. No tracheal deviation present. No thyromegaly present.  Cardiovascular: Normal rate, regular rhythm and normal heart sounds.   Pulmonary/Chest: No stridor. No respiratory distress. She has no wheezes.  Abdominal: Soft. Bowel sounds are normal. She exhibits no distension and no mass. There is no tenderness. There is no rebound and no guarding.    Musculoskeletal: She exhibits no edema or tenderness.  Lymphadenopathy:    She has no cervical adenopathy.  Neurological: She displays normal reflexes. No cranial nerve deficit. She exhibits normal muscle tone. Coordination abnormal.  Skin: No rash noted. No erythema.  Psychiatric: Thought content normal.  flat affect Alert and cooperative Disoriented to time and place  Lab Results  Component Value Date   WBC 4.9 01/19/2016   HGB 14.7 01/19/2016   HCT 41.6 01/19/2016   PLT 222.0 01/19/2016   GLUCOSE 227 (H) 01/19/2016   CHOL 166 08/23/2011   TRIG 115.0 08/23/2011   HDL 36.90 (L) 08/23/2011   LDLCALC 106 (H) 08/23/2011   ALT 24 01/19/2016   AST 21 01/19/2016   NA 140 01/19/2016   K 4.3 01/19/2016   CL 101 01/19/2016   CREATININE 1.14 01/19/2016   BUN 20 01/19/2016   CO2 30 01/19/2016   TSH 2.69 09/22/2015   INR 1.05 12/17/2013   HGBA1C 6.1 08/16/2014    Dg Chest 2 View  Result Date: 02/15/2015 CLINICAL DATA:  Hypertension. EXAM: CHEST  2 VIEW COMPARISON:  04/21/2014.  03/22/2014 . FINDINGS: Mediastinum hilar structures normal. Low lung volumes with mild bibasilar atelectasis. No prominent pleural effusion no pneumothorax. Diffuse osteopenia degenerative change. IMPRESSION: Low lung volumes with mild bibasilar atelectasis. Electronically Signed   By: Marcello Moores  Register   On: 02/15/2015 09:15    Assessment & Plan:   There are no diagnoses linked to this encounter. I have discontinued Ms. Radcliffe's cyclopentolate, gabapentin, and mupirocin ointment. I am also having her maintain her Vitamin B-12, Cholecalciferol, aspirin, febuxostat, losartan, Memantine HCl-Donepezil HCl, amLODipine, diphenoxylate-atropine, nitroGLYCERIN, DULoxetine, and OLANZapine.  No orders of the defined types were placed in this encounter.    Follow-up: No Follow-up on file.  Walker Kehr, MD

## 2016-05-20 NOTE — Progress Notes (Signed)
Pre visit review using our clinic review tool, if applicable. No additional management support is needed unless otherwise documented below in the visit note. 

## 2016-05-20 NOTE — Assessment & Plan Note (Signed)
Zyprexa  Potential benefits of a long term Zyprexa  use as well as potential risks  and complications were explained to the patient and were aknowledged.

## 2016-05-20 NOTE — Assessment & Plan Note (Signed)
Better  

## 2016-05-20 NOTE — Addendum Note (Signed)
Addended by: Karren Cobble on: 05/20/2016 08:28 AM   Modules accepted: Orders

## 2016-05-21 ENCOUNTER — Other Ambulatory Visit (HOSPITAL_COMMUNITY): Payer: Self-pay | Admitting: Psychiatry

## 2016-05-22 ENCOUNTER — Other Ambulatory Visit (HOSPITAL_COMMUNITY): Payer: Self-pay

## 2016-05-22 NOTE — Telephone Encounter (Signed)
Medication refill - Telephone call with patient's daughter who reported patient was in need of a refill of Zyprexa as she will run out in 2 days and not enough until appt. 05/27/16. Requests refill be sent.

## 2016-05-22 NOTE — Telephone Encounter (Signed)
Met with Dr. Adele Schilder who approved a new 90 day order of patient's prescribed Zyprexa as informed verified patient does not have enough until returns for evaluation on 05/27/16.  New 90 day order for patient's Zyprexa 5 mg e-scribed to patient's [facility] in [place] as requested and as approved by Dr. Adele Schilder this date.

## 2016-05-24 NOTE — Telephone Encounter (Signed)
Medication order approved by Dr. Adele Schilder 05/22/16 and e-scribed that date as ordered.

## 2016-05-27 ENCOUNTER — Ambulatory Visit (INDEPENDENT_AMBULATORY_CARE_PROVIDER_SITE_OTHER): Payer: Medicare Other | Admitting: Psychiatry

## 2016-05-27 ENCOUNTER — Encounter (HOSPITAL_COMMUNITY): Payer: Self-pay | Admitting: Psychiatry

## 2016-05-27 VITALS — BP 140/67 | HR 83 | Ht 63.0 in | Wt 169.4 lb

## 2016-05-27 DIAGNOSIS — F0630 Mood disorder due to known physiological condition, unspecified: Secondary | ICD-10-CM

## 2016-05-27 DIAGNOSIS — Z7982 Long term (current) use of aspirin: Secondary | ICD-10-CM

## 2016-05-27 DIAGNOSIS — I63419 Cerebral infarction due to embolism of unspecified middle cerebral artery: Secondary | ICD-10-CM

## 2016-05-27 DIAGNOSIS — Z79899 Other long term (current) drug therapy: Secondary | ICD-10-CM

## 2016-05-27 DIAGNOSIS — H35361 Drusen (degenerative) of macula, right eye: Secondary | ICD-10-CM | POA: Diagnosis not present

## 2016-05-27 DIAGNOSIS — H35371 Puckering of macula, right eye: Secondary | ICD-10-CM | POA: Diagnosis not present

## 2016-05-27 DIAGNOSIS — H35033 Hypertensive retinopathy, bilateral: Secondary | ICD-10-CM | POA: Diagnosis not present

## 2016-05-27 MED ORDER — DULOXETINE HCL 60 MG PO CPEP: 60.0000 mg | ORAL_CAPSULE | Freq: Every day | ORAL | 1 refills | Status: DC

## 2016-05-27 MED ORDER — OLANZAPINE 5 MG PO TABS: 5.0000 mg | ORAL_TABLET | Freq: Every day | ORAL | 1 refills | Status: DC

## 2016-05-27 NOTE — Progress Notes (Signed)
[place] MD/PA/NP OP Progress Note  05/27/2016 10:30 AM Alice Wong  MRN:  [id number]  Chief Complaint:  Subjective:  I'm sleeping better.  HPI: Zhania came for her follow-up appointment with her daughter and Optometrist.  Patient told that she is doing much better and sleeping all night.  Her daughter recently got her a puppy and she is very happy about it.  Daughter note is that she has more energy since we cut down the Zyprexa.  She is helping her daughter in daily routines like doing dishes .  She also noticed that she is walking more than usual and able to lose some weight.  She recently seen her primary care physician and she had a blood work.  She has a high blood sugar.  Patient has history of high blood sugar but she is not taking any medication.  We do not have any hemoglobin A1c.  Overall patient noticed increased energy, motivation, and feels very good about her depression.  She denies any irritability, anger, mania, psychosis or any hallucination.  She denies any suicidal thoughts or homicidal thought.  She has no tremors or shakes.  She does not have any negative thoughts.  Some time daughter has to discipline because she eats late and continued to snack on cookies.  Patient has headaches but she denies any feeling of hopelessness or worthlessness.  Visit Diagnosis:    ICD-9-CM ICD-10-CM   1. Mood disorder in conditions classified elsewhere 293.83 F06.30 OLANZapine (ZYPREXA) 5 MG tablet     DULoxetine (CYMBALTA) 60 MG capsule     Hemoglobin A1c  2. Encounter for long-term (current) use of medications V58.69 Z79.899 Hemoglobin A1c    Past Psychiatric History: Reviewed. Patient took Prozac from primary care physician for past 7-8 years for depression and anxiety symptoms. She do not recall any inpatient psychiatric treatment or any suicidal attempt. She admitted irritability, anger and mood swings but symptoms started to get worse in past few months. Patient denies any history of  psychosis. We tried Depakote 500 mg but patient did not see any improvement with the medication.  Past Medical History:  Past Medical History:  Diagnosis Date  . Anxiety   . Cholelithiasis   . CVA (cerebral vascular accident) ([place])   . Depression   . Diverticulosis   . GERD (gastroesophageal reflux disease)   . Gout 2009  . H. pylori infection 2011  . Hyperlipidemia   . Hypertension   . Memory loss   . MR (mitral regurgitation)   . Osteoporosis   . S/P cardiac cath 12/20/13 12/20/2013  . Thrombocytopenia ([place])   . Thyroid cyst   . Vaso-vagal reaction   . Vitamin D deficiency     Past Surgical History:  Procedure Laterality Date  . CESAREAN SECTION    . CHOLECYSTECTOMY  2010  . LASIK    . LEFT HEART CATHETERIZATION WITH CORONARY ANGIOGRAM N/A 12/20/2013   Procedure: LEFT HEART CATHETERIZATION WITH CORONARY ANGIOGRAM;  Surgeon: Burnell Blanks, MD;  Location: [facility] CATH LAB;  Service: Cardiovascular;  Laterality: N/A;    Family Psychiatric History: Reviewed.  Family History:  Family History  Problem Relation Age of Onset  . Lung disease Father        Deceased  . Hypertension Other   . Stomach cancer Brother   . Ulcers Son   . Colon cancer Neg Hx     Social History:  Social History   Social History  . Marital status: Widowed  Spouse name: N/A  . Number of children: 3  . Years of education: N/A   Occupational History  . RETIRED    Social History Main Topics  . Smoking status: Never Smoker  . Smokeless tobacco: Never Used  . Alcohol use No  . Drug use: No  . Sexual activity: No   Other Topics Concern  . None   Social History Narrative   Family History Hypertension   No caffeine    Lives with husband in a 2 story home.  Retired.    Moved from [place] in 2002    Allergies:  Allergies  Allergen Reactions  . Aricept [Donepezil Hcl]     ?dry tongue  . Coreg [Carvedilol]     ?problems  . Morphine Sulfate Other (See Comments)    REACTION:  decreased blood pressure    Metabolic Disorder Labs: Recent Results (from the past 2160 hour(s))  Basic metabolic panel     Status: Abnormal   Collection Time: 05/20/16  8:32 AM  Result Value Ref Range   Sodium 135 135 - 145 mEq/L   Potassium 3.8 3.5 - 5.1 mEq/L   Chloride 98 96 - 112 mEq/L   CO2 26 19 - 32 mEq/L   Glucose, Bld 455 (H) 70 - 99 mg/dL   BUN 16 6 - 23 mg/dL   Creatinine, Ser 0.96 0.40 - 1.20 mg/dL   Calcium 9.5 8.4 - 10.5 mg/dL   GFR 59.47 (L) >60.00 mL/min  Vitamin B12     Status: None   Collection Time: 05/20/16  8:32 AM  Result Value Ref Range   Vitamin B-12 429 211 - 911 pg/mL   Lab Results  Component Value Date   HGBA1C 6.1 08/16/2014   No results found for: PROLACTIN Lab Results  Component Value Date   CHOL 166 08/23/2011   TRIG 115.0 08/23/2011   HDL 36.90 (L) 08/23/2011   CHOLHDL 4 08/23/2011   VLDL 23.0 08/23/2011   LDLCALC 106 (H) 08/23/2011   LDLCALC 109 (H) 01/02/2010     Current Medications: Current Outpatient Prescriptions  Medication Sig Dispense Refill  . amLODipine (NORVASC) 5 MG tablet TAKE ONE TABLET BY MOUTH ONCE DAILY 90 tablet 1  . aspirin EC 81 MG EC tablet Take 1 tablet (81 mg total) by mouth daily.    . Cholecalciferol 1000 UNITS tablet Take 1,000 Units by mouth daily.     . Cyanocobalamin (VITAMIN B-12) 1000 MCG SUBL Place 1 tablet under the tongue daily.      . diphenoxylate-atropine (LOMOTIL) 2.5-0.025 MG tablet Take 1 tablet by mouth 4 (four) times daily as needed for diarrhea or loose stools. 60 tablet 1  . DULoxetine (CYMBALTA) 60 MG capsule Take 1 capsule (60 mg total) by mouth daily. 90 capsule 0  . febuxostat (ULORIC) 40 MG tablet Take 1 tablet (40 mg total) by mouth daily. 30 tablet 2  . losartan (COZAAR) 100 MG tablet Take 1 tablet (100 mg total) by mouth daily. 90 tablet 3  . Memantine HCl-Donepezil HCl (NAMZARIC) 28-10 MG CP24 Take 1 capsule by mouth daily. 30 capsule 11  . nitroGLYCERIN (NITROSTAT) 0.4 MG SL tablet  PLACE ONE TABLET UNDER THE TONGUE EVERY 5 MINUTES AS NEEDED FOR CHEST PAIN. 25 tablet 0  . OLANZapine (ZYPREXA) 5 MG tablet TAKE 1 TABLET BY MOUTH AT BEDTIME 90 tablet 0   No current facility-administered medications for this visit.     Neurologic: Headache: Yes Seizure: No Paresthesias: No  Musculoskeletal: Strength &  Muscle Tone: within normal limits Gait & Station: normal Patient leans: N/A  Psychiatric Specialty Exam: ROS  Blood pressure 140/67, pulse 83, height 5\' 3"  (1.6 m), weight 169 lb 6.4 oz (76.8 kg).Body mass index is 30.01 kg/m.  General Appearance: Casual  Eye Contact:  Good  Speech:  Slow  Volume:  Normal  Mood:  Euthymic  Affect:  Congruent  Thought Process:  Goal Directed  Orientation:  Full (Time, Place, and Person)  Thought Content: WDL and Logical   Suicidal Thoughts:  No  Homicidal Thoughts:  No  Memory:  Immediate;   Fair Recent;   Fair Remote;   Fair  Judgement:  Good  Insight:  Good  Psychomotor Activity:  Normal  Concentration:  Concentration: Fair and Attention Span: Fair  Recall:  [employer] of Knowledge: Good  Language: Fair  Akathisia:  No  Handed:  Right  AIMS (if indicated):  0  Assets:  Communication Skills Desire for Improvement Housing Physical Health Resilience Social Support  ADL's:  Intact  Cognition: Impaired,  Mild  Sleep:  Good     Assessment: Mood disorder due to general medical condition.  Depressive disorder NOS.  Auditor disorder NOS.  Plan: Patient is doing better since we decreased Zyprexa 5 mg at bedtime.  We do not have any hemoglobin A1c.  Recently she had blood work at her primary care physician and high blood sugar shows on her blood tests.  We will do hemoglobin A1c.  Patient does not seems to be interested in therapy.  I will continue Cymbalta 60 mg daily and Zyprexa 5 mg at bedtime.  Discuss to watch her calorie intake especially avoid snacking late at night.  If hemoglobin A1c is high we will contact  primary care physician for the management of hyperglycemia.  Recommended to call us back if she has any question or any concern.  Follow-up in 6 months.  Anh Bigos T., MD 05/27/2016, 10:30 AM

## 2016-05-29 ENCOUNTER — Other Ambulatory Visit: Payer: Self-pay | Admitting: Internal Medicine

## 2016-05-31 ENCOUNTER — Telehealth: Payer: Self-pay

## 2016-05-31 NOTE — Telephone Encounter (Signed)
LVM for patient to come have blood drawn for BMET and A1C, and to call back if she has any questions

## 2016-08-21 ENCOUNTER — Other Ambulatory Visit: Payer: Self-pay | Admitting: Internal Medicine

## 2016-08-27 ENCOUNTER — Ambulatory Visit (INDEPENDENT_AMBULATORY_CARE_PROVIDER_SITE_OTHER): Payer: Medicare Other | Admitting: Internal Medicine

## 2016-08-27 DIAGNOSIS — E1165 Type 2 diabetes mellitus with hyperglycemia: Secondary | ICD-10-CM | POA: Diagnosis not present

## 2016-08-27 DIAGNOSIS — F068 Other specified mental disorders due to known physiological condition: Secondary | ICD-10-CM

## 2016-08-27 DIAGNOSIS — I10 Essential (primary) hypertension: Secondary | ICD-10-CM | POA: Diagnosis not present

## 2016-08-27 DIAGNOSIS — E1122 Type 2 diabetes mellitus with diabetic chronic kidney disease: Secondary | ICD-10-CM | POA: Diagnosis not present

## 2016-08-27 DIAGNOSIS — IMO0002 Reserved for concepts with insufficient information to code with codable children: Secondary | ICD-10-CM

## 2016-08-27 DIAGNOSIS — R739 Hyperglycemia, unspecified: Secondary | ICD-10-CM

## 2016-08-27 DIAGNOSIS — F0630 Mood disorder due to known physiological condition, unspecified: Secondary | ICD-10-CM

## 2016-08-27 DIAGNOSIS — F418 Other specified anxiety disorders: Secondary | ICD-10-CM | POA: Diagnosis not present

## 2016-08-27 DIAGNOSIS — I63419 Cerebral infarction due to embolism of unspecified middle cerebral artery: Secondary | ICD-10-CM | POA: Diagnosis not present

## 2016-08-27 DIAGNOSIS — F0390 Unspecified dementia without behavioral disturbance: Secondary | ICD-10-CM

## 2016-08-27 DIAGNOSIS — N183 Chronic kidney disease, stage 3 (moderate): Secondary | ICD-10-CM

## 2016-08-27 DIAGNOSIS — R55 Syncope and collapse: Secondary | ICD-10-CM

## 2016-08-27 DIAGNOSIS — E559 Vitamin D deficiency, unspecified: Secondary | ICD-10-CM | POA: Diagnosis not present

## 2016-08-27 DIAGNOSIS — F0392 Unspecified dementia, unspecified severity, with psychotic disturbance: Secondary | ICD-10-CM

## 2016-08-27 LAB — POCT GLYCOSYLATED HEMOGLOBIN (HGB A1C): Hemoglobin A1C: 10.3

## 2016-08-27 MED ORDER — METFORMIN HCL 500 MG PO TABS: 500.0000 mg | ORAL_TABLET | Freq: Two times a day (BID) | ORAL | 3 refills | Status: DC

## 2016-08-27 NOTE — Assessment & Plan Note (Signed)
Losartan, Amlodipine

## 2016-08-27 NOTE — Progress Notes (Signed)
Subjective:  Patient ID: Alice Wong, female    DOB: November 13, 1934  Age: 82 y.o. MRN: [id number]  CC: No chief complaint on file.   HPI SAIGE CANTON presents for dementia, psychotic features, elev glu, HA, R foot pain f/u  Outpatient Medications Prior to Visit  Medication Sig Dispense Refill  . amLODipine (NORVASC) 5 MG tablet TAKE ONE TABLET BY MOUTH ONCE DAILY 90 tablet 1  . aspirin EC 81 MG EC tablet Take 1 tablet (81 mg total) by mouth daily.    . diphenoxylate-atropine (LOMOTIL) 2.5-0.025 MG tablet Take 1 tablet by mouth 4 (four) times daily as needed for diarrhea or loose stools. 60 tablet 1  . DULoxetine (CYMBALTA) 60 MG capsule Take 1 capsule (60 mg total) by mouth daily. 90 capsule 1  . NAMZARIC 28-10 MG CP24 TAKE ONE CAPSULE BY MOUTH ONCE DAILY 90 capsule 3  . nitroGLYCERIN (NITROSTAT) 0.4 MG SL tablet DISSOLVE ONE TABLET UNDER THE TONGUE EVERY 5 MINUTES AS NEEDED FOR CHEST PAIN 25 tablet 0  . OLANZapine (ZYPREXA) 5 MG tablet Take 1 tablet (5 mg total) by mouth at bedtime. 90 tablet 1  . Cholecalciferol 1000 UNITS tablet Take 1,000 Units by mouth daily.     . Cyanocobalamin (VITAMIN B-12) 1000 MCG SUBL Place 1 tablet under the tongue daily.      . febuxostat (ULORIC) 40 MG tablet Take 1 tablet (40 mg total) by mouth daily. 30 tablet 2  . losartan (COZAAR) 100 MG tablet Take 1 tablet (100 mg total) by mouth daily. 90 tablet 3   No facility-administered medications prior to visit.     ROS Review of Systems  Constitutional: Negative for activity change, appetite change, chills, fatigue and unexpected weight change.  HENT: Negative for congestion, mouth sores and sinus pressure.   Eyes: Negative for visual disturbance.  Respiratory: Negative for cough and chest tightness.   Gastrointestinal: Negative for abdominal pain and nausea.  Genitourinary: Negative for difficulty urinating, frequency and vaginal pain.  Musculoskeletal: Positive for arthralgias. Negative for back  pain and gait problem.  Skin: Negative for pallor and rash.  Neurological: Positive for dizziness, light-headedness and headaches. Negative for tremors, weakness and numbness.  Psychiatric/Behavioral: Positive for behavioral problems and confusion. Negative for sleep disturbance. The patient is nervous/anxious.     Objective:  BP 124/82 (BP Location: Left Arm, Patient Position: Sitting, Cuff Size: Normal)   Pulse 65   Temp 98 F (36.7 C) (Oral)   Ht 5\' 3"  (1.6 m)   Wt 164 lb (74.4 kg)   SpO2 98%   BMI 29.05 kg/m   BP Readings from Last 3 Encounters:  08/27/16 124/82  05/20/16 140/90  01/19/16 140/82    Wt Readings from Last 3 Encounters:  08/27/16 164 lb (74.4 kg)  05/20/16 172 lb 1.3 oz (78.1 kg)  01/19/16 172 lb (78 kg)    Physical Exam  Constitutional: She appears well-developed. No distress.  HENT:  Head: Normocephalic.  Right Ear: External ear normal.  Left Ear: External ear normal.  Nose: Nose normal.  Mouth/Throat: Oropharynx is clear and moist.  Eyes: Pupils are equal, round, and reactive to light. Conjunctivae are normal. Right eye exhibits no discharge. Left eye exhibits no discharge.  Neck: Normal range of motion. Neck supple. No JVD present. No tracheal deviation present. No thyromegaly present.  Cardiovascular: Normal rate, regular rhythm and normal heart sounds.   Pulmonary/Chest: No stridor. No respiratory distress. She has no wheezes.  Abdominal: Soft. Bowel  sounds are normal. She exhibits no distension and no mass. There is no tenderness. There is no rebound and no guarding.  Musculoskeletal: She exhibits no edema or tenderness.  Lymphadenopathy:    She has no cervical adenopathy.  Neurological: She displays normal reflexes. No cranial nerve deficit. She exhibits normal muscle tone. Coordination normal.  Skin: No rash noted. No erythema.  apathetic Alert and cooperative, disoriented R big toe w/severe valgus  Lab Results  Component Value Date    WBC 4.9 01/19/2016   HGB 14.7 01/19/2016   HCT 41.6 01/19/2016   PLT 222.0 01/19/2016   GLUCOSE 455 (H) 05/20/2016   CHOL 166 08/23/2011   TRIG 115.0 08/23/2011   HDL 36.90 (L) 08/23/2011   LDLCALC 106 (H) 08/23/2011   ALT 24 01/19/2016   AST 21 01/19/2016   NA 135 05/20/2016   K 3.8 05/20/2016   CL 98 05/20/2016   CREATININE 0.96 05/20/2016   BUN 16 05/20/2016   CO2 26 05/20/2016   TSH 2.69 09/22/2015   INR 1.05 12/17/2013   HGBA1C 6.1 08/16/2014    Dg Chest 2 View  Result Date: 02/15/2015 CLINICAL DATA:  Hypertension. EXAM: CHEST  2 VIEW COMPARISON:  04/21/2014.  03/22/2014 . FINDINGS: Mediastinum hilar structures normal. Low lung volumes with mild bibasilar atelectasis. No prominent pleural effusion no pneumothorax. Diffuse osteopenia degenerative change. IMPRESSION: Low lung volumes with mild bibasilar atelectasis. Electronically Signed   By: Marcello Moores  Register   On: 02/15/2015 09:15    Assessment & Plan:   There are no diagnoses linked to this encounter. I have discontinued Ms. Kniskern's Vitamin B-12, Cholecalciferol, febuxostat, and losartan. I am also having her maintain her aspirin, diphenoxylate-atropine, amLODipine, OLANZapine, DULoxetine, NAMZARIC, and nitroGLYCERIN.  No orders of the defined types were placed in this encounter.    Follow-up: No Follow-up on file.  Walker Kehr, MD

## 2016-08-27 NOTE — Assessment & Plan Note (Addendum)
A1c >10% On diet Start Metformin 8/18

## 2016-08-27 NOTE — Assessment & Plan Note (Signed)
Cymbalta, Zyprexa

## 2016-08-27 NOTE — Assessment & Plan Note (Signed)
On Rx 

## 2016-08-27 NOTE — Assessment & Plan Note (Signed)
Zyprexa - dose was reduced

## 2016-08-27 NOTE — Assessment & Plan Note (Signed)
No relapse 

## 2016-11-25 ENCOUNTER — Other Ambulatory Visit: Payer: Self-pay | Admitting: Internal Medicine

## 2016-11-27 ENCOUNTER — Ambulatory Visit (INDEPENDENT_AMBULATORY_CARE_PROVIDER_SITE_OTHER): Payer: Medicare Other | Admitting: Psychiatry

## 2016-11-27 ENCOUNTER — Encounter (HOSPITAL_COMMUNITY): Payer: Self-pay | Admitting: Psychiatry

## 2016-11-27 VITALS — BP 122/78 | HR 74 | Ht 63.0 in | Wt 161.6 lb

## 2016-11-27 DIAGNOSIS — M549 Dorsalgia, unspecified: Secondary | ICD-10-CM | POA: Diagnosis not present

## 2016-11-27 DIAGNOSIS — Z8673 Personal history of transient ischemic attack (TIA), and cerebral infarction without residual deficits: Secondary | ICD-10-CM | POA: Diagnosis not present

## 2016-11-27 DIAGNOSIS — Z79899 Other long term (current) drug therapy: Secondary | ICD-10-CM

## 2016-11-27 DIAGNOSIS — F329 Major depressive disorder, single episode, unspecified: Secondary | ICD-10-CM

## 2016-11-27 DIAGNOSIS — G8929 Other chronic pain: Secondary | ICD-10-CM

## 2016-11-27 DIAGNOSIS — F09 Unspecified mental disorder due to known physiological condition: Secondary | ICD-10-CM

## 2016-11-27 DIAGNOSIS — F0630 Mood disorder due to known physiological condition, unspecified: Secondary | ICD-10-CM | POA: Diagnosis not present

## 2016-11-27 DIAGNOSIS — G4700 Insomnia, unspecified: Secondary | ICD-10-CM

## 2016-11-27 DIAGNOSIS — R51 Headache: Secondary | ICD-10-CM

## 2016-11-27 DIAGNOSIS — R251 Tremor, unspecified: Secondary | ICD-10-CM | POA: Diagnosis not present

## 2016-11-27 MED ORDER — CHLORPROMAZINE HCL 25 MG PO TABS: 25.0000 mg | ORAL_TABLET | Freq: Every day | ORAL | 0 refills | Status: DC

## 2016-11-27 MED ORDER — BENZTROPINE MESYLATE 0.5 MG PO TABS: 0.5000 mg | ORAL_TABLET | Freq: Every day | ORAL | 0 refills | Status: DC

## 2016-11-27 MED ORDER — DULOXETINE HCL 60 MG PO CPEP: 60.0000 mg | ORAL_CAPSULE | Freq: Every day | ORAL | 0 refills | Status: DC

## 2016-11-27 NOTE — Progress Notes (Signed)
[place] MD/PA/NP OP Progress Note  11/27/2016 8:35 AM Alice Wong  MRN:  [id number]  Chief Complaint: I have a lot of pain.  I have headaches.  HPI: Patient came for her follow-up appointment with her daughter and translator.  She is complaining of body pain, headache and not sleeping well.  Daughter endorse that she has been regressing and noticing increased irritability, mood swings, agitation and paranoia.  She does not sleep by herself because she is scared.  Patient reported that she is paranoid because she believed people in the house will come and hurt her.  Daughter also endorsed that she is easily irritable and agitated and started screaming and loud with the neighbors.  Daughter believes she is progressing to her initial condition.  Patient is preoccupied with her somatic complaints.  She does not like by herself.  She admitted crying spells, racing thoughts, but denies any suicidal thoughts or homicidal thoughts.  Daughter endorse that family life is difficult because her husband is not happy with patient's behavior.  There is discussion about nursing home but patient does not want to live in a nursing home.  Daughter want to send her to [place] but there is no family member who takes responsibility for the patient.  Patient had a good response with Zyprexa 10 mg but does reduce when she had high hemoglobin A1c.  She is now taking metformin.  She has mild tremors in her hand.  She has mild memory impairment and daughter did not let her go alone by herself.  Her daughter brought to puppies the patient does not interact with the puppy as much.  Her energy level is low.  Her attention and concentration is fair.  Visit Diagnosis:    ICD-10-CM   1. Mood disorder in conditions classified elsewhere F06.30 chlorproMAZINE (THORAZINE) 25 MG tablet    benztropine (COGENTIN) 0.5 MG tablet    DULoxetine (CYMBALTA) 60 MG capsule    Past Psychiatric History: Reviewed. Patient took Prozac from primary care  physician for past 7-8 years for depression and anxiety symptoms. She do not recall any inpatient psychiatric treatment or any suicidal attempt. Patient has history of mood swings, anger, irritability.  We tried Depakote 500 mg but patient did not see any improvement with the medication.  She had a good response with Zyprexa 10 mg but does reduced due to high hemoglobin A1c and symptoms started to come back.  Past Medical History:  Past Medical History:  Diagnosis Date  . Anxiety   . Cholelithiasis   . CVA (cerebral vascular accident) ([place])   . Depression   . Diverticulosis   . GERD (gastroesophageal reflux disease)   . Gout 2009  . H. pylori infection 2011  . Hyperlipidemia   . Hypertension   . Memory loss   . MR (mitral regurgitation)   . Osteoporosis   . S/P cardiac cath 12/20/13 12/20/2013  . Thrombocytopenia ([place])   . Thyroid cyst   . Vaso-vagal reaction   . Vitamin D deficiency     Past Surgical History:  Procedure Laterality Date  . CESAREAN SECTION    . CHOLECYSTECTOMY  2010  . LASIK      Family Psychiatric History: Reviewed.  Family History:  Family History  Problem Relation Age of Onset  . Lung disease Father        Deceased  . Hypertension Other   . Stomach cancer Brother   . Ulcers Son   . Colon cancer Neg Hx  Social History:  Social History   Socioeconomic History  . Marital status: Widowed    Spouse name: Not on file  . Number of children: 3  . Years of education: Not on file  . Highest education level: Not on file  Social Needs  . Financial resource strain: Not on file  . Food insecurity - worry: Not on file  . Food insecurity - inability: Not on file  . Transportation needs - medical: Not on file  . Transportation needs - non-medical: Not on file  Occupational History  . Occupation: RETIRED  Tobacco Use  . Smoking status: Never Smoker  . Smokeless tobacco: Never Used  Substance and Sexual Activity  . Alcohol use: No     Alcohol/week: 0.0 oz  . Drug use: No  . Sexual activity: No  Other Topics Concern  . Not on file  Social History Narrative   Family History Hypertension   No caffeine    Lives with husband in a 2 story home.  Retired.    Moved from [place] in 2002    Allergies:  Allergies  Allergen Reactions  . Aricept [Donepezil Hcl]     ?dry tongue  . Coreg [Carvedilol]     ?problems  . Morphine Sulfate Other (See Comments)    REACTION: decreased blood pressure    Metabolic Disorder Labs: Lab Results  Component Value Date   HGBA1C 10.3 08/27/2016   No results found for: PROLACTIN Lab Results  Component Value Date   CHOL 166 08/23/2011   TRIG 115.0 08/23/2011   HDL 36.90 (L) 08/23/2011   CHOLHDL 4 08/23/2011   VLDL 23.0 08/23/2011   LDLCALC 106 (H) 08/23/2011   LDLCALC 109 (H) 01/02/2010   Lab Results  Component Value Date   TSH 2.69 09/22/2015   TSH 4.98 (H) 05/19/2015    Therapeutic Level Labs: No results found for: LITHIUM No results found for: VALPROATE No components found for:  CBMZ  Current Medications: Current Outpatient Medications  Medication Sig Dispense Refill  . amLODipine (NORVASC) 5 MG tablet TAKE ONE TABLET BY MOUTH ONCE DAILY 90 tablet 1  . aspirin EC 81 MG EC tablet Take 1 tablet (81 mg total) by mouth daily.    . benztropine (COGENTIN) 0.5 MG tablet Take 1 tablet (0.5 mg total) at bedtime by mouth. 30 tablet 0  . chlorproMAZINE (THORAZINE) 25 MG tablet Take 1 tablet (25 mg total) at bedtime by mouth. 30 tablet 0  . diphenoxylate-atropine (LOMOTIL) 2.5-0.025 MG tablet Take 1 tablet by mouth 4 (four) times daily as needed for diarrhea or loose stools. 60 tablet 1  . DULoxetine (CYMBALTA) 60 MG capsule Take 1 capsule (60 mg total) daily by mouth. 90 capsule 0  . metFORMIN (GLUCOPHAGE) 500 MG tablet Take 1 tablet (500 mg total) by mouth 2 (two) times daily with a meal. 180 tablet 3  . NAMZARIC 28-10 MG CP24 TAKE ONE CAPSULE BY MOUTH ONCE DAILY 90 capsule 3  .  nitroGLYCERIN (NITROSTAT) 0.4 MG SL tablet DISSOLVE ONE TABLET UNDER THE TONGUE EVERY 5 MINUTES AS NEEDED FOR CHEST PAIN 25 tablet 0   No current facility-administered medications for this visit.      Musculoskeletal: Strength & Muscle Tone: within normal limits Gait & Station: normal Patient leans: N/A  Psychiatric Specialty Exam: Review of Systems  Constitutional: Positive for weight loss.  HENT: Negative.   Respiratory: Negative.   Cardiovascular: Negative.   Genitourinary: Negative.   Musculoskeletal: Positive for back pain and joint  pain.  Skin: Negative.   Neurological: Positive for headaches.  Psychiatric/Behavioral: The patient is nervous/anxious and has insomnia.     Blood pressure 122/78, pulse 74, height 5\' 3"  (1.6 m), weight 161 lb 9.6 oz (73.3 kg).There is no height or weight on file to calculate BMI.  General Appearance: Casual and Guarded  Eye Contact:  Fair  Speech:  Slow  Volume:  Normal  Mood:  Depressed and Irritable  Affect:  Constricted  Thought Process:  Descriptions of Associations: Circumstantial  Orientation:  Full (Time, Place, and Person)  Thought Content: Rumination and Preoccupied with her somatic complaints and headaches   Suicidal Thoughts:  No  Homicidal Thoughts:  No  Memory:  Immediate;   Fair Recent;   Fair Remote;   Fair  Judgement:  Fair  Insight:  Fair  Psychomotor Activity:  Decreased and Tremor  Concentration:  Concentration: Fair and Attention Span: Fair  Recall:  [employer] of Knowledge: Fair  Language: Fair  Akathisia:  No  Handed:  Right  AIMS (if indicated): not done  Assets:  Desire for Improvement Housing  ADL's:  Intact  Cognition: Impaired,  Mild  Sleep:  Poor   Screenings: PHQ2-9     Office Visit from 05/19/2015 in [place] Visit from 08/23/2014 [facility] Total Score  6  0  PHQ-9 Total Score  11  No data       Assessment and Plan: Mood disorder due to  general medical condition.  Depressive disorder NOS.  Cognitive disorder NOS  I review her symptoms, recent blood work results and current medication.  Patient is slowly decompensating since Zyprexa dose reduced due to high hemoglobin A1c.  I will try Thorazine 25 mg at bedtime to help mood lability and also add Cogentin 0.5 mg to help the tremors.  I will discontinue Zyprexa.  However if patient do not improved then daughter wants to go back on 10 mg Zyprexa which was helping her mood and sleep.  Discussed safety concerns at any time having active suicidal thoughts, homicidal thoughts or severe aggressive behavior than she need to call 911 and come to the emergency room.  I would also continue Cymbalta 60 mg daily.  I reviewed records from primary care physician.  I recommended that she should see her primary care physician for headaches and chronic pain.  Time spent 25 minutes.  More than 50% of the time spent in psychoeducation, counseling and coordination of care.  Follow-up in 3-4 weeks.   Alice Polivka T., MD 11/27/2016, 8:35 AM

## 2016-12-03 ENCOUNTER — Ambulatory Visit: Payer: Self-pay | Admitting: Internal Medicine

## 2016-12-12 ENCOUNTER — Other Ambulatory Visit (HOSPITAL_COMMUNITY): Payer: Self-pay

## 2016-12-12 DIAGNOSIS — F0630 Mood disorder due to known physiological condition, unspecified: Secondary | ICD-10-CM

## 2016-12-12 MED ORDER — BENZTROPINE MESYLATE 0.5 MG PO TABS: 0.5000 mg | ORAL_TABLET | Freq: Every day | ORAL | 0 refills | Status: DC

## 2016-12-12 MED ORDER — CHLORPROMAZINE HCL 25 MG PO TABS: 25.0000 mg | ORAL_TABLET | Freq: Every day | ORAL | 0 refills | Status: DC

## 2016-12-13 ENCOUNTER — Other Ambulatory Visit (INDEPENDENT_AMBULATORY_CARE_PROVIDER_SITE_OTHER): Payer: Medicare Other

## 2016-12-13 ENCOUNTER — Ambulatory Visit (INDEPENDENT_AMBULATORY_CARE_PROVIDER_SITE_OTHER): Payer: Medicare Other | Admitting: Internal Medicine

## 2016-12-13 ENCOUNTER — Encounter: Payer: Self-pay | Admitting: Internal Medicine

## 2016-12-13 DIAGNOSIS — I63419 Cerebral infarction due to embolism of unspecified middle cerebral artery: Secondary | ICD-10-CM

## 2016-12-13 DIAGNOSIS — F0392 Unspecified dementia, unspecified severity, with psychotic disturbance: Secondary | ICD-10-CM

## 2016-12-13 DIAGNOSIS — F068 Other specified mental disorders due to known physiological condition: Secondary | ICD-10-CM | POA: Diagnosis not present

## 2016-12-13 DIAGNOSIS — E1129 Type 2 diabetes mellitus with other diabetic kidney complication: Secondary | ICD-10-CM

## 2016-12-13 DIAGNOSIS — E1165 Type 2 diabetes mellitus with hyperglycemia: Secondary | ICD-10-CM

## 2016-12-13 DIAGNOSIS — R55 Syncope and collapse: Secondary | ICD-10-CM | POA: Diagnosis not present

## 2016-12-13 DIAGNOSIS — IMO0002 Reserved for concepts with insufficient information to code with codable children: Secondary | ICD-10-CM

## 2016-12-13 DIAGNOSIS — F0390 Unspecified dementia without behavioral disturbance: Secondary | ICD-10-CM

## 2016-12-13 DIAGNOSIS — I10 Essential (primary) hypertension: Secondary | ICD-10-CM

## 2016-12-13 DIAGNOSIS — F0630 Mood disorder due to known physiological condition, unspecified: Secondary | ICD-10-CM

## 2016-12-13 LAB — HEPATIC FUNCTION PANEL
ALT: 15 U/L (ref 0–35)
AST: 13 U/L (ref 0–37)
Albumin: 4.4 g/dL (ref 3.5–5.2)
Alkaline Phosphatase: 63 U/L (ref 39–117)
Bilirubin, Direct: 0.1 mg/dL (ref 0.0–0.3)
Total Bilirubin: 0.6 mg/dL (ref 0.2–1.2)
Total Protein: 7.1 g/dL (ref 6.0–8.3)

## 2016-12-13 LAB — BASIC METABOLIC PANEL
BUN: 15 mg/dL (ref 6–23)
CO2: 30 mEq/L (ref 19–32)
Calcium: 9.7 mg/dL (ref 8.4–10.5)
Chloride: 102 mEq/L (ref 96–112)
Creatinine, Ser: 0.99 mg/dL (ref 0.40–1.20)
GFR: 57.31 mL/min — ABNORMAL LOW (ref >60.00)
Glucose, Bld: 165 mg/dL — ABNORMAL HIGH (ref 70–99)
Potassium: 4 mEq/L (ref 3.5–5.1)
Sodium: 139 mEq/L (ref 135–145)

## 2016-12-13 LAB — HEMOGLOBIN A1C: Hgb A1c MFr Bld: 6.4 % (ref 4.6–6.5)

## 2016-12-13 NOTE — Assessment & Plan Note (Signed)
No relapse 

## 2016-12-13 NOTE — Assessment & Plan Note (Signed)
Metformin since 8/18 Labs

## 2016-12-13 NOTE — Assessment & Plan Note (Signed)
F/u w/dr Arfeen meds are being adjusted

## 2016-12-13 NOTE — Assessment & Plan Note (Signed)
BP Readings from Last 3 Encounters:  12/13/16 120/68  08/27/16 124/82  05/20/16 140/90

## 2016-12-13 NOTE — Progress Notes (Signed)
Subjective:  Patient ID: Alice Wong, female    DOB: Apr 27, 1934  Age: 82 y.o. MRN: [id number]  CC: No chief complaint on file.   HPI Alice Wong presents for DM, psychotic dementia, depression. On Cogentin. Pharmacy did not give Chlorpromazine due to arrhythmia potential side effect  Outpatient Medications Prior to Visit  Medication Sig Dispense Refill  . amLODipine (NORVASC) 5 MG tablet Take 1 tablet (5 mg total) daily by mouth. 90 tablet 1  . aspirin EC 81 MG EC tablet Take 1 tablet (81 mg total) by mouth daily.    . benztropine (COGENTIN) 0.5 MG tablet Take 1 tablet (0.5 mg total) by mouth at bedtime. 30 tablet 0  . chlorproMAZINE (THORAZINE) 25 MG tablet Take 1 tablet (25 mg total) by mouth at bedtime. 30 tablet 0  . diphenoxylate-atropine (LOMOTIL) 2.5-0.025 MG tablet Take 1 tablet by mouth 4 (four) times daily as needed for diarrhea or loose stools. 60 tablet 1  . DULoxetine (CYMBALTA) 60 MG capsule Take 1 capsule (60 mg total) daily by mouth. 90 capsule 0  . metFORMIN (GLUCOPHAGE) 500 MG tablet Take 1 tablet (500 mg total) by mouth 2 (two) times daily with a meal. 180 tablet 3  . NAMZARIC 28-10 MG CP24 TAKE ONE CAPSULE BY MOUTH ONCE DAILY 90 capsule 3  . nitroGLYCERIN (NITROSTAT) 0.4 MG SL tablet DISSOLVE ONE TABLET UNDER THE TONGUE EVERY 5 MINUTES AS NEEDED FOR CHEST PAIN 25 tablet 0   No facility-administered medications prior to visit.     ROS Review of Systems  Constitutional: Negative for activity change, appetite change, chills, fatigue and unexpected weight change.  HENT: Negative for congestion, mouth sores and sinus pressure.   Eyes: Negative for visual disturbance.  Respiratory: Negative for cough and chest tightness.   Gastrointestinal: Negative for abdominal pain and nausea.  Genitourinary: Negative for difficulty urinating, frequency and vaginal pain.  Musculoskeletal: Negative for back pain and gait problem.  Skin: Negative for pallor and rash.    Neurological: Negative for dizziness, tremors, weakness, numbness and headaches.  Psychiatric/Behavioral: Positive for confusion and dysphoric mood. Negative for self-injury, sleep disturbance and suicidal ideas. The patient is nervous/anxious.     Objective:  BP 120/68 (BP Location: Right Arm, Patient Position: Sitting, Cuff Size: Large)   Pulse 68   Temp 97.7 F (36.5 C) (Oral)   Ht 5\' 3"  (1.6 m)   Wt 161 lb (73 kg)   SpO2 98%   BMI 28.52 kg/m   BP Readings from Last 3 Encounters:  12/13/16 120/68  08/27/16 124/82  05/20/16 140/90    Wt Readings from Last 3 Encounters:  12/13/16 161 lb (73 kg)  08/27/16 164 lb (74.4 kg)  05/20/16 172 lb 1.3 oz (78.1 kg)    Physical Exam  Constitutional: She appears well-developed. No distress.  HENT:  Head: Normocephalic.  Right Ear: External ear normal.  Left Ear: External ear normal.  Nose: Nose normal.  Mouth/Throat: Oropharynx is clear and moist.  Eyes: Conjunctivae are normal. Pupils are equal, round, and reactive to light. Right eye exhibits no discharge. Left eye exhibits no discharge.  Neck: Normal range of motion. Neck supple. No JVD present. No tracheal deviation present. No thyromegaly present.  Cardiovascular: Normal rate, regular rhythm and normal heart sounds.  Pulmonary/Chest: No stridor. No respiratory distress. She has no wheezes.  Abdominal: Soft. Bowel sounds are normal. She exhibits no distension and no mass. There is no tenderness. There is no rebound and no guarding.  Musculoskeletal: She exhibits no edema or tenderness.  Lymphadenopathy:    She has no cervical adenopathy.  Neurological: She displays normal reflexes. No cranial nerve deficit. She exhibits normal muscle tone. Coordination normal.  Skin: No rash noted. No erythema.  flat affect  confused  Lab Results  Component Value Date   WBC 4.9 01/19/2016   HGB 14.7 01/19/2016   HCT 41.6 01/19/2016   PLT 222.0 01/19/2016   GLUCOSE 455 (H) 05/20/2016    CHOL 166 08/23/2011   TRIG 115.0 08/23/2011   HDL 36.90 (L) 08/23/2011   LDLCALC 106 (H) 08/23/2011   ALT 24 01/19/2016   AST 21 01/19/2016   NA 135 05/20/2016   K 3.8 05/20/2016   CL 98 05/20/2016   CREATININE 0.96 05/20/2016   BUN 16 05/20/2016   CO2 26 05/20/2016   TSH 2.69 09/22/2015   INR 1.05 12/17/2013   HGBA1C 10.3 08/27/2016    Dg Chest 2 View  Result Date: 02/15/2015 CLINICAL DATA:  Hypertension. EXAM: CHEST  2 VIEW COMPARISON:  04/21/2014.  03/22/2014 . FINDINGS: Mediastinum hilar structures normal. Low lung volumes with mild bibasilar atelectasis. No prominent pleural effusion no pneumothorax. Diffuse osteopenia degenerative change. IMPRESSION: Low lung volumes with mild bibasilar atelectasis. Electronically Signed   By: Marcello Moores  Register   On: 02/15/2015 09:15    Assessment & Plan:   There are no diagnoses linked to this encounter. I am having Alice Wong maintain her aspirin, diphenoxylate-atropine, NAMZARIC, nitroGLYCERIN, metFORMIN, amLODipine, DULoxetine, benztropine, and chlorproMAZINE.  No orders of the defined types were placed in this encounter.    Follow-up: No Follow-up on file.  Walker Kehr, MD

## 2016-12-19 ENCOUNTER — Ambulatory Visit (HOSPITAL_COMMUNITY): Payer: Self-pay | Admitting: Psychiatry

## 2017-01-05 ENCOUNTER — Other Ambulatory Visit (HOSPITAL_COMMUNITY): Payer: Self-pay | Admitting: Psychiatry

## 2017-01-05 DIAGNOSIS — F0630 Mood disorder due to known physiological condition, unspecified: Secondary | ICD-10-CM

## 2017-01-13 ENCOUNTER — Other Ambulatory Visit (HOSPITAL_COMMUNITY): Payer: Self-pay

## 2017-01-13 DIAGNOSIS — F0630 Mood disorder due to known physiological condition, unspecified: Secondary | ICD-10-CM

## 2017-01-13 MED ORDER — BENZTROPINE MESYLATE 0.5 MG PO TABS: 0.5000 mg | ORAL_TABLET | Freq: Every day | ORAL | 0 refills | Status: DC

## 2017-01-13 MED ORDER — CHLORPROMAZINE HCL 25 MG PO TABS: 25.0000 mg | ORAL_TABLET | Freq: Every day | ORAL | 0 refills | Status: DC

## 2017-01-21 ENCOUNTER — Ambulatory Visit (INDEPENDENT_AMBULATORY_CARE_PROVIDER_SITE_OTHER): Payer: Medicare Other | Admitting: Psychiatry

## 2017-01-21 ENCOUNTER — Encounter (HOSPITAL_COMMUNITY): Payer: Self-pay | Admitting: Psychiatry

## 2017-01-21 DIAGNOSIS — F0630 Mood disorder due to known physiological condition, unspecified: Secondary | ICD-10-CM

## 2017-01-21 DIAGNOSIS — G4700 Insomnia, unspecified: Secondary | ICD-10-CM | POA: Diagnosis not present

## 2017-01-21 DIAGNOSIS — F332 Major depressive disorder, recurrent severe without psychotic features: Secondary | ICD-10-CM

## 2017-01-21 MED ORDER — LURASIDONE HCL 40 MG PO TABS: 40.0000 mg | ORAL_TABLET | Freq: Every day | ORAL | 0 refills | Status: DC

## 2017-01-21 NOTE — Progress Notes (Signed)
[place] MD/PA/NP OP Progress Note  01/21/2017 2:56 PM Alice Wong  MRN:  [id number]  Chief Complaint: I do not sleep well.  I have headaches.  HPI: Alice Wong came for her follow-up appointment with her daughter and translator.  On her last visit we started her on Thorazine because Zyprexa causing weight gain and high blood sugar.  She is taking 25 mg Thorazine but family believes it is not helping her.  She continued to scream, delusional, having crying spells and poor sleep.  She is scared and believed people in the house and they are going to hurt her.  She continues to be loud with the neighbors.  She is preoccupied with her somatic complaints.  Daughter endorse yesterday she was crying 2 hours for no reason.  She has difficulty remembering things.  Daughter is concerned because she cannot handle her anymore.  There was a discussion about nursing home but patient does not want to live in a nursing home.  Daughter wants to send her back to [place] but there were no family member who can take the responsibility for the patient.  She had a good response with Zyprexa however due to weight gain and high blood sugar be switched to Thorazine.  Her blood sugar is improved and her last hemoglobin A1c is 6.1.  She also lost weight from the last visit.  Patient also have memory impairment and daughter believe that she need someone to take care for her.  Her attention and concentration is poor.  Today she did response to yes and no but she has difficulty keeping the conversation.  She is calm during the conversation but remained preoccupied with her somatic complaints.  Her appetite is okay.  Her energy level is fair.     Visit Diagnosis:    ICD-10-CM   1. Mood disorder in conditions classified elsewhere F06.30 lurasidone (LATUDA) 40 MG TABS tablet    Past Psychiatric History: Viewed. Patient took Prozac from primary care physician for 7-8 years for depression and anxiety symptoms. She do not recall any inpatient  psychiatric treatment or any suicidal attempt. Patient has history of mood swings, anger, irritability.  We tried Depakote 500 mg and Thorazine 25 mg but with limited response.  She had a good response with Zyprexa 10 mg but it was discontinued due to high hemoglobin A1c and weight gain.    Past Medical History:  Past Medical History:  Diagnosis Date  . Anxiety   . Cholelithiasis   . CVA (cerebral vascular accident) ([place])   . Depression   . Diverticulosis   . GERD (gastroesophageal reflux disease)   . Gout 2009  . H. pylori infection 2011  . Hyperlipidemia   . Hypertension   . Memory loss   . MR (mitral regurgitation)   . Osteoporosis   . S/P cardiac cath 12/20/13 12/20/2013  . Thrombocytopenia ([place])   . Thyroid cyst   . Vaso-vagal reaction   . Vitamin D deficiency     Past Surgical History:  Procedure Laterality Date  . CESAREAN SECTION    . CHOLECYSTECTOMY  2010  . LASIK    . LEFT HEART CATHETERIZATION WITH CORONARY ANGIOGRAM N/A 12/20/2013   Procedure: LEFT HEART CATHETERIZATION WITH CORONARY ANGIOGRAM;  Surgeon: Burnell Blanks, MD;  Location: [facility] CATH LAB;  Service: Cardiovascular;  Laterality: N/A;    Family Psychiatric History: Reviewed.    Family History:  Family History  Problem Relation Age of Onset  . Lung disease Father  Deceased  . Hypertension Other   . Stomach cancer Brother   . Ulcers Son   . Colon cancer Neg Hx     Social History:  Social History   Socioeconomic History  . Marital status: Widowed    Spouse name: None  . Number of children: 3  . Years of education: None  . Highest education level: None  Social Needs  . Financial resource strain: None  . Food insecurity - worry: None  . Food insecurity - inability: None  . Transportation needs - medical: None  . Transportation needs - non-medical: None  Occupational History  . Occupation: RETIRED  Tobacco Use  . Smoking status: Never Smoker  . Smokeless tobacco: Never Used   Substance and Sexual Activity  . Alcohol use: No    Alcohol/week: 0.0 oz  . Drug use: No  . Sexual activity: No  Other Topics Concern  . None  Social History Narrative   Family History Hypertension   No caffeine    Lives with husband in a 2 story home.  Retired.    Moved from [place] in 2002    Allergies:  Allergies  Allergen Reactions  . Aricept [Donepezil Hcl]     ?dry tongue  . Coreg [Carvedilol]     ?problems  . Morphine Sulfate Other (See Comments)    REACTION: decreased blood pressure    Metabolic Disorder Labs: Recent Results (from the past 2160 hour(s))  Basic metabolic panel     Status: Abnormal   Collection Time: 12/13/16  8:29 AM  Result Value Ref Range   Sodium 139 135 - 145 mEq/L   Potassium 4.0 3.5 - 5.1 mEq/L   Chloride 102 96 - 112 mEq/L   CO2 30 19 - 32 mEq/L   Glucose, Bld 165 (H) 70 - 99 mg/dL   BUN 15 6 - 23 mg/dL   Creatinine, Ser 0.99 0.40 - 1.20 mg/dL   Calcium 9.7 8.4 - 10.5 mg/dL   GFR 57.31 (L) >60.00 mL/min  Hepatic function panel     Status: None   Collection Time: 12/13/16  8:29 AM  Result Value Ref Range   Total Bilirubin 0.6 0.2 - 1.2 mg/dL   Bilirubin, Direct 0.1 0.0 - 0.3 mg/dL   Alkaline Phosphatase 63 39 - 117 U/L   AST 13 0 - 37 U/L   ALT 15 0 - 35 U/L   Total Protein 7.1 6.0 - 8.3 g/dL   Albumin 4.4 3.5 - 5.2 g/dL  Hemoglobin A1c     Status: None   Collection Time: 12/13/16  8:29 AM  Result Value Ref Range   Hgb A1c MFr Bld 6.4 4.6 - 6.5 %    Comment: Glycemic Control Guidelines for People with Diabetes:Non Diabetic:  <6%Goal of Therapy: <7%Additional Action Suggested:  >8%    Lab Results  Component Value Date   HGBA1C 6.4 12/13/2016   No results found for: PROLACTIN Lab Results  Component Value Date   CHOL 166 08/23/2011   TRIG 115.0 08/23/2011   HDL 36.90 (L) 08/23/2011   CHOLHDL 4 08/23/2011   VLDL 23.0 08/23/2011   LDLCALC 106 (H) 08/23/2011   LDLCALC 109 (H) 01/02/2010   Lab Results  Component Value Date    TSH 2.69 09/22/2015   TSH 4.98 (H) 05/19/2015    Therapeutic Level Labs: No results found for: LITHIUM No results found for: VALPROATE No components found for:  CBMZ  Current Medications: Current Outpatient Medications  Medication Sig Dispense Refill  .  amLODipine (NORVASC) 5 MG tablet Take 1 tablet (5 mg total) daily by mouth. 90 tablet 1  . chlorproMAZINE (THORAZINE) 25 MG tablet Take 1 tablet (25 mg total) by mouth at bedtime. 30 tablet 0  . diphenoxylate-atropine (LOMOTIL) 2.5-0.025 MG tablet Take 1 tablet by mouth 4 (four) times daily as needed for diarrhea or loose stools. 60 tablet 1  . DULoxetine (CYMBALTA) 60 MG capsule Take 1 capsule (60 mg total) daily by mouth. 90 capsule 0  . metFORMIN (GLUCOPHAGE) 500 MG tablet Take 1 tablet (500 mg total) by mouth 2 (two) times daily with a meal. 180 tablet 3  . NAMZARIC 28-10 MG CP24 TAKE ONE CAPSULE BY MOUTH ONCE DAILY 90 capsule 3  . nitroGLYCERIN (NITROSTAT) 0.4 MG SL tablet DISSOLVE ONE TABLET UNDER THE TONGUE EVERY 5 MINUTES AS NEEDED FOR CHEST PAIN 25 tablet 0  . aspirin EC 81 MG EC tablet Take 1 tablet (81 mg total) by mouth daily. (Patient not taking: Reported on 01/21/2017)    . benztropine (COGENTIN) 0.5 MG tablet Take 1 tablet (0.5 mg total) by mouth at bedtime. (Patient not taking: Reported on 01/21/2017) 30 tablet 0   No current facility-administered medications for this visit.      Musculoskeletal: Strength & Muscle Tone: within normal limits Gait & Station: normal Patient leans: N/A  Psychiatric Specialty Exam: Review of Systems  Constitutional: Positive for weight loss.  Musculoskeletal: Positive for back pain and joint pain.  Skin: Negative.  Negative for itching and rash.  Neurological: Positive for headaches.  Psychiatric/Behavioral: Positive for depression. The patient has insomnia.     Blood pressure 128/66, pulse 80, height 5\' 2"  (1.575 m), weight 155 lb (70.3 kg), SpO2 96 %.Body mass index is 28.35 kg/m.   General Appearance: Casual  Eye Contact:  Fair  Speech:  Slow  Volume:  Decreased  Mood:  Depressed  Affect:  Constricted and Restricted  Thought Process:  Descriptions of Associations: Circumstantial  Orientation:  Full (Time, Place, and Person)  Thought Content: Paranoid Ideation and Poverty of thought content.  Preoccupied with her somatic complaints   Suicidal Thoughts:  No  Homicidal Thoughts:  No  Memory:  Immediate;   Fair Recent;   Fair Remote;   Fair  Judgement:  Fair  Insight:  Fair  Psychomotor Activity:  Decreased  Concentration:  Concentration: Fair and Attention Span: Fair  Recall:  [employer] of Knowledge: Fair  Language: Fair  Akathisia:  No  Handed:  Right  AIMS (if indicated): not done  Assets:  Desire for Improvement Housing Resilience  ADL's:  Intact  Cognition: Impaired,  Mild  Sleep:  Fair   Screenings: PHQ2-9     Office Visit from 05/19/2015 in [place] Visit from 08/23/2014 in Primary Care at [facility] Total Score  6  0  PHQ-9 Total Score  11  No data       Assessment and Plan: Depressive disorder NOS.  Cognitive disorder NOS.  Mood disorder due to general medical condition.  I reviewed records from her primary care physician including blood work results.  Her hemoglobin A1c is improved and she lost weight from the last visit.  However she is not doing very well on Thorazine.  She continues to have agitation, paranoia and delusions.  I would discontinue Thorazine and Cogentin.  We will try Latuda 20 mg daily for 2 weeks and then 40 mg daily.  Discussed medication side effects and benefits.  If patient do not see improvement with Latuda we will go back on Zyprexa 10 mg which had helped her in the past.  Continue Cymbalta 60 mg daily.  Follow-up in 4-6 weeks.  Her BUN/creatinine is within normal range.  She is not taking metformin.  Time spent 25 minutes.  More than 50% of the time spent in psychoeducation,  counseling, coordination of care and explaining treatment plan to the family member.   Kathlee Nations, MD 01/21/2017, 2:56 PM

## 2017-01-23 ENCOUNTER — Telehealth (HOSPITAL_COMMUNITY): Payer: Self-pay | Admitting: *Deleted

## 2017-01-23 NOTE — Telephone Encounter (Signed)
Prior authorization for Cogentin received. Upon chart review medication was discontinued on 01/21/17. Authorization not longer required.

## 2017-02-19 ENCOUNTER — Ambulatory Visit: Payer: Self-pay | Admitting: Internal Medicine

## 2017-02-21 ENCOUNTER — Encounter: Payer: Self-pay | Admitting: Internal Medicine

## 2017-02-21 ENCOUNTER — Ambulatory Visit (INDEPENDENT_AMBULATORY_CARE_PROVIDER_SITE_OTHER)
Admission: RE | Admit: 2017-02-21 | Discharge: 2017-02-21 | Disposition: A | Discharge status: Home / Self Care | Payer: Medicare Other | Source: Ambulatory Visit | Attending: Internal Medicine | Admitting: Internal Medicine

## 2017-02-21 ENCOUNTER — Ambulatory Visit (INDEPENDENT_AMBULATORY_CARE_PROVIDER_SITE_OTHER): Payer: Medicare Other | Admitting: Internal Medicine

## 2017-02-21 DIAGNOSIS — S299XXA Unspecified injury of thorax, initial encounter: Secondary | ICD-10-CM | POA: Diagnosis not present

## 2017-02-21 DIAGNOSIS — E1129 Type 2 diabetes mellitus with other diabetic kidney complication: Secondary | ICD-10-CM

## 2017-02-21 DIAGNOSIS — R0789 Other chest pain: Secondary | ICD-10-CM | POA: Diagnosis not present

## 2017-02-21 DIAGNOSIS — S20212A Contusion of left front wall of thorax, initial encounter: Secondary | ICD-10-CM | POA: Diagnosis not present

## 2017-02-21 DIAGNOSIS — F068 Other specified mental disorders due to known physiological condition: Secondary | ICD-10-CM | POA: Diagnosis not present

## 2017-02-21 DIAGNOSIS — F0392 Unspecified dementia, unspecified severity, with psychotic disturbance: Secondary | ICD-10-CM

## 2017-02-21 DIAGNOSIS — IMO0002 Reserved for concepts with insufficient information to code with codable children: Secondary | ICD-10-CM

## 2017-02-21 DIAGNOSIS — E1165 Type 2 diabetes mellitus with hyperglycemia: Secondary | ICD-10-CM

## 2017-02-21 DIAGNOSIS — F0630 Mood disorder due to known physiological condition, unspecified: Secondary | ICD-10-CM

## 2017-02-21 DIAGNOSIS — F0390 Unspecified dementia without behavioral disturbance: Secondary | ICD-10-CM

## 2017-02-21 DIAGNOSIS — S20219A Contusion of unspecified front wall of thorax, initial encounter: Secondary | ICD-10-CM | POA: Insufficient documentation

## 2017-02-21 NOTE — Assessment & Plan Note (Signed)
L side -- CXR

## 2017-02-21 NOTE — Progress Notes (Signed)
Subjective:  Patient ID: Alice Wong, female    DOB: [birth year]  Age: 83 y.o. MRN: [id number]  CC: Fall [place] three weeks ago outside-having left quadrant abdominal pain)   HPI Alice Wong presents for HAs, HTN, depression and dementia f/u C/o a fall 10 d ago and is c/o L CP  Outpatient Medications Prior to Visit  Medication Sig Dispense Refill  . amLODipine (NORVASC) 5 MG tablet Take 1 tablet (5 mg total) daily by mouth. 90 tablet 1  . aspirin EC 81 MG EC tablet Take 1 tablet (81 mg total) by mouth daily.    . diphenoxylate-atropine (LOMOTIL) 2.5-0.025 MG tablet Take 1 tablet by mouth 4 (four) times daily as needed for diarrhea or loose stools. 60 tablet 1  . DULoxetine (CYMBALTA) 60 MG capsule Take 1 capsule (60 mg total) daily by mouth. 90 capsule 0  . lurasidone (LATUDA) 40 MG TABS tablet Take 1 tablet (40 mg total) by mouth daily with breakfast. 30 tablet 0  . metFORMIN (GLUCOPHAGE) 500 MG tablet Take 1 tablet (500 mg total) by mouth 2 (two) times daily with a meal. 180 tablet 3  . NAMZARIC 28-10 MG CP24 TAKE ONE CAPSULE BY MOUTH ONCE DAILY 90 capsule 3  . nitroGLYCERIN (NITROSTAT) 0.4 MG SL tablet DISSOLVE ONE TABLET UNDER THE TONGUE EVERY 5 MINUTES AS NEEDED FOR CHEST PAIN 25 tablet 0   No facility-administered medications prior to visit.     ROS Review of Systems  Constitutional: Negative for activity change, appetite change, chills, fatigue and unexpected weight change.  HENT: Negative for congestion, mouth sores and sinus pressure.   Eyes: Negative for visual disturbance.  Respiratory: Negative for cough and chest tightness.   Cardiovascular: Positive for chest pain.  Gastrointestinal: Negative for abdominal pain and nausea.  Genitourinary: Negative for difficulty urinating, frequency and vaginal pain.  Musculoskeletal: Negative for back pain and gait problem.  Skin: Negative for pallor and rash.  Neurological: Negative for dizziness, tremors, weakness, numbness  and headaches.  Psychiatric/Behavioral: Negative for confusion and sleep disturbance.    Objective:  BP 118/66 (BP Location: Left Arm, Patient Position: Sitting, Cuff Size: Normal)   Pulse 83   Temp 98.3 F (36.8 C) (Oral)   Ht 5\' 2"  (1.575 m)   Wt 152 lb (68.9 kg)   SpO2 95%   BMI 27.80 kg/m   BP Readings from Last 3 Encounters:  02/21/17 118/66  12/13/16 120/68  08/27/16 124/82    Wt Readings from Last 3 Encounters:  02/21/17 152 lb (68.9 kg)  12/13/16 161 lb (73 kg)  08/27/16 164 lb (74.4 kg)    Physical Exam  Constitutional: She appears well-developed. No distress.  HENT:  Head: Normocephalic.  Right Ear: External ear normal.  Left Ear: External ear normal.  Nose: Nose normal.  Mouth/Throat: Oropharynx is clear and moist.  Eyes: Conjunctivae are normal. Pupils are equal, round, and reactive to light. Right eye exhibits no discharge. Left eye exhibits no discharge.  Neck: Normal range of motion. Neck supple. No JVD present. No tracheal deviation present. No thyromegaly present.  Cardiovascular: Normal rate, regular rhythm and normal heart sounds.  Pulmonary/Chest: No stridor. No respiratory distress. She has no wheezes.  Abdominal: Soft. Bowel sounds are normal. She exhibits no distension and no mass. There is no tenderness. There is no rebound and no guarding.  Musculoskeletal: She exhibits no edema or tenderness.  Lymphadenopathy:    She has no cervical adenopathy.  Neurological: She displays normal  reflexes. No cranial nerve deficit. She exhibits normal muscle tone. Coordination abnormal.  Skin: No rash noted. No erythema.  Psychiatric: She has a normal mood and affect. Her behavior is normal. Judgment and thought content normal.  Hand tremor L ribs NT  Lab Results  Component Value Date   WBC 4.9 01/19/2016   HGB 14.7 01/19/2016   HCT 41.6 01/19/2016   PLT 222.0 01/19/2016   GLUCOSE 165 (H) 12/13/2016   CHOL 166 08/23/2011   TRIG 115.0 08/23/2011   HDL  36.90 (L) 08/23/2011   LDLCALC 106 (H) 08/23/2011   ALT 15 12/13/2016   AST 13 12/13/2016   NA 139 12/13/2016   K 4.0 12/13/2016   CL 102 12/13/2016   CREATININE 0.99 12/13/2016   BUN 15 12/13/2016   CO2 30 12/13/2016   TSH 2.69 09/22/2015   INR 1.05 12/17/2013   HGBA1C 6.4 12/13/2016    Dg Chest 2 View  Result Date: 02/15/2015 CLINICAL DATA:  Hypertension. EXAM: CHEST  2 VIEW COMPARISON:  04/21/2014.  03/22/2014 . FINDINGS: Mediastinum hilar structures normal. Low lung volumes with mild bibasilar atelectasis. No prominent pleural effusion no pneumothorax. Diffuse osteopenia degenerative change. IMPRESSION: Low lung volumes with mild bibasilar atelectasis. Electronically Signed   By: Marcello Moores  Register   On: 02/15/2015 09:15    Assessment & Plan:   There are no diagnoses linked to this encounter. I am having Alice Wong maintain her aspirin, diphenoxylate-atropine, NAMZARIC, nitroGLYCERIN, metFORMIN, amLODipine, DULoxetine, and lurasidone.  No orders of the defined types were placed in this encounter.    Follow-up: No Follow-up on file.  Walker Kehr, MD

## 2017-02-21 NOTE — Assessment & Plan Note (Signed)
On Metformin 

## 2017-02-23 ENCOUNTER — Other Ambulatory Visit (HOSPITAL_COMMUNITY): Payer: Self-pay | Admitting: Psychiatry

## 2017-02-23 DIAGNOSIS — F0630 Mood disorder due to known physiological condition, unspecified: Secondary | ICD-10-CM

## 2017-02-24 ENCOUNTER — Ambulatory Visit (INDEPENDENT_AMBULATORY_CARE_PROVIDER_SITE_OTHER): Payer: Medicare Other | Admitting: Psychiatry

## 2017-02-24 ENCOUNTER — Other Ambulatory Visit: Payer: Self-pay | Admitting: Internal Medicine

## 2017-02-24 ENCOUNTER — Encounter (HOSPITAL_COMMUNITY): Payer: Self-pay | Admitting: Psychiatry

## 2017-02-24 ENCOUNTER — Encounter: Payer: Self-pay | Admitting: Internal Medicine

## 2017-02-24 DIAGNOSIS — Z79899 Other long term (current) drug therapy: Secondary | ICD-10-CM

## 2017-02-24 DIAGNOSIS — G3184 Mild cognitive impairment, so stated: Secondary | ICD-10-CM

## 2017-02-24 DIAGNOSIS — F0630 Mood disorder due to known physiological condition, unspecified: Secondary | ICD-10-CM

## 2017-02-24 DIAGNOSIS — R52 Pain, unspecified: Secondary | ICD-10-CM | POA: Diagnosis not present

## 2017-02-24 DIAGNOSIS — G4700 Insomnia, unspecified: Secondary | ICD-10-CM

## 2017-02-24 DIAGNOSIS — F339 Major depressive disorder, recurrent, unspecified: Secondary | ICD-10-CM | POA: Diagnosis not present

## 2017-02-24 DIAGNOSIS — R251 Tremor, unspecified: Secondary | ICD-10-CM | POA: Diagnosis not present

## 2017-02-24 DIAGNOSIS — L600 Ingrowing nail: Secondary | ICD-10-CM

## 2017-02-24 DIAGNOSIS — M79672 Pain in left foot: Principal | ICD-10-CM

## 2017-02-24 DIAGNOSIS — M79671 Pain in right foot: Secondary | ICD-10-CM

## 2017-02-24 MED ORDER — DULOXETINE HCL 60 MG PO CPEP: 60.0000 mg | ORAL_CAPSULE | Freq: Every day | ORAL | 0 refills | Status: DC

## 2017-02-24 MED ORDER — LURASIDONE HCL 60 MG PO TABS: 60.0000 mg | ORAL_TABLET | Freq: Every day | ORAL | 0 refills | Status: DC

## 2017-02-24 NOTE — Progress Notes (Signed)
[place] MD/PA/NP OP Progress Note  02/24/2017 8:24 AM Alice Wong  MRN:  [id number]  Chief Complaint: My body hurts.  Some nights I cannot sleep because of pain.  HPI: Patient came for her follow-up appointment with her daughter, translator and her home health aide.  She was last seen 4 weeks ago.  At that time we stopped Thorazine because it was not helping her delusions, crying spells, agitation.  We started her on Latuda.  She is taking Latuda 40 mg.  As per the daughter and home health aide she is doing better as no more severe agitation or delusions but continued to have some time crying spells, poor sleep.  She continued to feel sometimes scared at home but denies any suicidal thoughts or any homicidal thoughts.  Daughter endorse since started [place] she is no more screening on the neighbors.  However she is preoccupied with her somatic complaints, headaches and insomnia.  Few days ago she fell but luckily did not injured herself.  She was seen to her primary care physician.  Patient admitted that sometimes she feels very lonely I desire to go back to [place] but there is no family member who lives in [place] who can take the responsibility.  Patient has memory impairment and recently she feels it is getting worse.  Her attention consultation is poor.  Now she has 3 hours home health aide to help her ADLs.  Most of the information was obtained through translator as patient does not speak [place].  However she did response to yes and no to most of the question.  Her appetite is okay.  Her weight is a stable.  Her energy level is fair.  She denies any hallucination or any suicidal thoughts.  She has mild tremors in her hand.  Visit Diagnosis:    ICD-10-CM   1. Mood disorder in conditions classified elsewhere F06.30 Lurasidone HCl 60 MG TABS    DULoxetine (CYMBALTA) 60 MG capsule    Past Psychiatric History: Reviewed. Patient took Prozac from primary care physician for 7-8 years for depression and  anxiety symptoms. She do not recall any inpatient psychiatric treatment or any suicidal attempt. Patient has history of paranoia, delusion, agitation, mood swings, anger, irritability.We tried Depakote 500 mg and Thorazine 25 mg but with limited response.  She had a good response with Zyprexa 10 mg but it was discontinued due to high hemoglobin A1c and weight gain.    Past Medical History:  Past Medical History:  Diagnosis Date  . Anxiety   . Cholelithiasis   . CVA (cerebral vascular accident) ([place])   . Depression   . Diverticulosis   . GERD (gastroesophageal reflux disease)   . Gout 2009  . H. pylori infection 2011  . Hyperlipidemia   . Hypertension   . Memory loss   . MR (mitral regurgitation)   . Osteoporosis   . S/P cardiac cath 12/20/13 12/20/2013  . Thrombocytopenia ([facility])   . Thyroid cyst   . Vaso-vagal reaction   . Vitamin D deficiency     Past Surgical History:  Procedure Laterality Date  . CESAREAN SECTION    . CHOLECYSTECTOMY  2010  . LASIK    . LEFT HEART CATHETERIZATION WITH CORONARY ANGIOGRAM N/A 12/20/2013   Procedure: LEFT HEART CATHETERIZATION WITH CORONARY ANGIOGRAM;  Surgeon: Burnell Blanks, MD;  Location: [facility] CATH LAB;  Service: Cardiovascular;  Laterality: N/A;    Family Psychiatric History: Reviewed.  Family History:  Family History  Problem Relation  Age of Onset  . Lung disease Father        Deceased  . Hypertension Other   . Stomach cancer Brother   . Ulcers Son   . Colon cancer Neg Hx     Social History:  Social History   Socioeconomic History  . Marital status: Widowed    Spouse name: Not on file  . Number of children: 3  . Years of education: Not on file  . Highest education level: Not on file  Social Needs  . Financial resource strain: Not on file  . Food insecurity - worry: Not on file  . Food insecurity - inability: Not on file  . Transportation needs - medical: Not on file  . Transportation needs - non-medical: Not on  file  Occupational History  . Occupation: RETIRED  Tobacco Use  . Smoking status: Never Smoker  . Smokeless tobacco: Never Used  Substance and Sexual Activity  . Alcohol use: No    Alcohol/week: 0.0 oz  . Drug use: No  . Sexual activity: No  Other Topics Concern  . Not on file  Social History Narrative   Family History Hypertension   No caffeine    Lives with husband in a 2 story home.  Retired.    Moved from [place] in 2002    Allergies:  Allergies  Allergen Reactions  . Aricept [Donepezil Hcl]     ?dry tongue  . Coreg [Carvedilol]     ?problems  . Morphine Sulfate Other (See Comments)    REACTION: decreased blood pressure    Metabolic Disorder Labs: Lab Results  Component Value Date   HGBA1C 6.4 12/13/2016   No results found for: PROLACTIN Lab Results  Component Value Date   CHOL 166 08/23/2011   TRIG 115.0 08/23/2011   HDL 36.90 (L) 08/23/2011   CHOLHDL 4 08/23/2011   VLDL 23.0 08/23/2011   LDLCALC 106 (H) 08/23/2011   LDLCALC 109 (H) 01/02/2010   Lab Results  Component Value Date   TSH 2.69 09/22/2015   TSH 4.98 (H) 05/19/2015    Therapeutic Level Labs: No results found for: LITHIUM No results found for: VALPROATE No components found for:  CBMZ  Current Medications: Current Outpatient Medications  Medication Sig Dispense Refill  . amLODipine (NORVASC) 5 MG tablet Take 1 tablet (5 mg total) daily by mouth. 90 tablet 1  . aspirin EC 81 MG EC tablet Take 1 tablet (81 mg total) by mouth daily.    . diphenoxylate-atropine (LOMOTIL) 2.5-0.025 MG tablet Take 1 tablet by mouth 4 (four) times daily as needed for diarrhea or loose stools. 60 tablet 1  . DULoxetine (CYMBALTA) 60 MG capsule Take 1 capsule (60 mg total) daily by mouth. 90 capsule 0  . lurasidone (LATUDA) 40 MG TABS tablet Take 1 tablet (40 mg total) by mouth daily with breakfast. 30 tablet 0  . metFORMIN (GLUCOPHAGE) 500 MG tablet Take 1 tablet (500 mg total) by mouth 2 (two) times daily with a  meal. 180 tablet 3  . NAMZARIC 28-10 MG CP24 TAKE ONE CAPSULE BY MOUTH ONCE DAILY 90 capsule 3  . nitroGLYCERIN (NITROSTAT) 0.4 MG SL tablet DISSOLVE ONE TABLET UNDER THE TONGUE EVERY 5 MINUTES AS NEEDED FOR CHEST PAIN 25 tablet 0   No current facility-administered medications for this visit.      Musculoskeletal: Strength & Muscle Tone: within normal limits Gait & Station: normal Patient leans: N/A  Psychiatric Specialty Exam: Review of Systems  Constitutional: Negative.  Musculoskeletal: Positive for back pain and joint pain.  Neurological: Positive for tremors and headaches.  Psychiatric/Behavioral: Positive for memory loss. The patient has insomnia.     Blood pressure (!) 142/86, pulse 67, height 5' 3.25" (1.607 m), weight 155 lb (70.3 kg).Body mass index is 27.24 kg/m.  General Appearance: Casual  Eye Contact:  Fair  Speech:  Slow  Volume:  Decreased  Mood:  Depressed  Affect:  Constricted  Thought Process:  Descriptions of Associations: Circumstantial  Orientation:  Full (Time, Place, and Person)  Thought Content: Paranoid Ideation and Poverty of thought content, preoccupied with the somatic complaints.   Suicidal Thoughts:  No  Homicidal Thoughts:  No  Memory:  Immediate;   Fair Recent;   Fair Remote;   Fair  Judgement:  Fair  Insight:  Fair  Psychomotor Activity:  Decreased and Mild tremors in her hand  Concentration:  Concentration: Fair and Attention Span: Fair  Recall:  [employer] of Knowledge: Fair  Language: Fair  Akathisia:  No  Handed:  Right  AIMS (if indicated): not done  Assets:  Desire for Improvement Housing Social Support  ADL's:  Intact  Cognition: Impaired,  Mild  Sleep:  Fair   Screenings: PHQ2-9     Office Visit from 02/21/2017 in [place] Visit from 05/19/2015 in [place] Visit from 08/23/2014 in [facility] at [facility] Total Score  2  6  0  PHQ-9 Total Score  12   11  No data       Assessment and Plan: Major depressive disorder, recurrent.  Mood disorder due to general medical condition.  Cognitive disorder NOS.  As per daughter patient doing better on Latuda but continued to have episodic agitation, crying spells and irritability.  She is preoccupied with her somatic complaints.  She has mild tremors in her hand and her memory is gradually decline.  I recommended to try Latuda 60 mg daily and continue Cymbalta 60 mg daily.  We will refer her to see neurology for memory impairment.  I also discussed that she should see primary care physician for her somatic complaints especially headaches.  Time spent 25 minutes.  Follow-up in 2 months.  Discussed safety concerns at any time having active suicidal thoughts or homicidal thought that she need to call 911 or go to local emergency room.  Kathlee Nations, MD 02/24/2017, 8:24 AM

## 2017-02-24 NOTE — Assessment & Plan Note (Signed)
On Zyprexa.  Follow-up with Dr. Adele Schilder

## 2017-02-25 ENCOUNTER — Other Ambulatory Visit (HOSPITAL_COMMUNITY): Payer: Self-pay | Admitting: Psychiatry

## 2017-02-25 DIAGNOSIS — F0630 Mood disorder due to known physiological condition, unspecified: Secondary | ICD-10-CM

## 2017-03-04 ENCOUNTER — Encounter: Payer: Self-pay | Admitting: Sports Medicine

## 2017-03-04 ENCOUNTER — Ambulatory Visit (INDEPENDENT_AMBULATORY_CARE_PROVIDER_SITE_OTHER): Payer: Medicare Other | Admitting: Sports Medicine

## 2017-03-04 VITALS — BP 130/81 | HR 78 | Resp 16

## 2017-03-04 DIAGNOSIS — M79674 Pain in right toe(s): Secondary | ICD-10-CM

## 2017-03-04 DIAGNOSIS — M2042 Other hammer toe(s) (acquired), left foot: Secondary | ICD-10-CM

## 2017-03-04 DIAGNOSIS — M2041 Other hammer toe(s) (acquired), right foot: Secondary | ICD-10-CM

## 2017-03-04 DIAGNOSIS — L600 Ingrowing nail: Secondary | ICD-10-CM | POA: Diagnosis not present

## 2017-03-04 DIAGNOSIS — B351 Tinea unguium: Secondary | ICD-10-CM | POA: Diagnosis not present

## 2017-03-04 DIAGNOSIS — M79675 Pain in left toe(s): Secondary | ICD-10-CM

## 2017-03-04 DIAGNOSIS — M21619 Bunion of unspecified foot: Secondary | ICD-10-CM | POA: Diagnosis not present

## 2017-03-04 NOTE — Progress Notes (Signed)
Subjective: Alice Wong is a 83 y.o. female patient seen today in office with complaint of mildly painful thickened and elongated toenails that tend to grown inward on bigtoenails; unable to trim. Patient has interpreter and daughter who denies history of Diabetes (even though on Metformin), Neuropathy, or known Vascular disease. Patient admits some nawing pain over bunions that is relieved with Tylenol sometimes or shoes. Patient has no other pedal complaints at this time.   Review of Systems  All other systems reviewed and are negative.    Patient Active Problem List   Diagnosis Date Noted  . Chest wall contusion 02/21/2017  . Diarrhea 09/22/2015  . Redness of eye, right 02/15/2015  . Noncompliance with medication regimen 11/15/2014  . 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 Cerebral infarction ([place]) 07/21/2014  . Sternum pain 04/21/2014  . Costochondritis 04/13/2014  . Simple chronic bronchitis ([place]) 04/13/2014  . Cough 03/22/2014  . S/P cardiac cath 12/20/13, non obsteructive disease 12/20/2013  . Abnormal nuclear stress test, false positive per cath   . Vasovagal syncope 12/17/2013  . Midsternal chest pain 12/17/2013  . Neoplasm of uncertain behavior of skin 10/27/2013  . Uncontrolled type 2 diabetes with renal manifestation ([place]) 11/04/2012  . Pain of right thumb 12/20/2011  . Psychosomatic disorder 08/23/2011  . Tinnitus 06/04/2011  . Headache, post-traumatic, chronic 05/01/2011  . LBP (low back pain) 01/30/2011  . Insomnia 11/19/2010  . Actinic keratoses 05/23/2010  . Dizziness 04/09/2010  . Bruising 04/09/2010  . Thrombocytopenia ([facility]) 01/02/2010  . HEMORRHOIDS 12/12/2009  . HELICOBACTER PYLORI INFECTION 03/17/2009  . PARESTHESIA 03/17/2009  . Chest pain, unspecified 08/01/2008  . Vitamin D deficiency 03/03/2008  . GAIT IMBALANCE 10/22/2007  . Gout, unspecified 05/07/2007  . FOOT PAIN 05/07/2007  . Dyslipidemia 02/05/2007   . Anxiety state 02/05/2007  . Depression with anxiety 02/05/2007  . Essential hypertension 02/05/2007  . GERD 02/05/2007  . OSTEOPOROSIS 02/05/2007  . Memory loss 02/05/2007  . History of cardiovascular disorder 02/05/2007    Current Outpatient Medications on File Prior to Visit  Medication Sig Dispense Refill  . amLODipine (NORVASC) 5 MG tablet Take 1 tablet (5 mg total) daily by mouth. 90 tablet 1  . aspirin EC 81 MG EC tablet Take 1 tablet (81 mg total) by mouth daily.    . diphenoxylate-atropine (LOMOTIL) 2.5-0.025 MG tablet Take 1 tablet by mouth 4 (four) times daily as needed for diarrhea or loose stools. 60 tablet 1  . DULoxetine (CYMBALTA) 60 MG capsule Take 1 capsule (60 mg total) by mouth daily. 90 capsule 0  . Lurasidone HCl 60 MG TABS Take 1 tablet (60 mg total) by mouth daily with breakfast. 90 tablet 0  . metFORMIN (GLUCOPHAGE) 500 MG tablet Take 1 tablet (500 mg total) by mouth 2 (two) times daily with a meal. 180 tablet 3  . NAMZARIC 28-10 MG CP24 TAKE ONE CAPSULE BY MOUTH ONCE DAILY 90 capsule 3  . nitroGLYCERIN (NITROSTAT) 0.4 MG SL tablet DISSOLVE ONE TABLET UNDER THE TONGUE EVERY 5 MINUTES AS NEEDED FOR CHEST PAIN 25 tablet 0   No current facility-administered medications on file prior to visit.     Allergies  Allergen Reactions  . Aricept [Donepezil Hcl]     ?dry tongue  . Coreg [Carvedilol]     ?problems  . Morphine Sulfate Other (See Comments)    REACTION: decreased blood pressure    Objective: Physical Exam  General: Well developed, nourished, no acute  distress, awake, alert and oriented x 3  Vascular: Dorsalis pedis artery 1/4 bilateral, Posterior tibial artery 1/4 bilateral, skin temperature warm to warm proximal to distal bilateral lower extremities, + varicosities, Scant pedal hair present bilateral.  Neurological: Gross sensation present via light touch bilateral.   Dermatological: Skin is warm, dry, and supple bilateral, Nails 1-10 are tender,  long, thick, and discolored with mild subungal debris, no acute ingrowing however per patient and home aide nail digs in on both big toes, no webspace macerations present bilateral, no open lesions present bilateral, no callus/corns/hyperkeratotic tissue present bilateral. No signs of infection bilateral.  Musculoskeletal: End stage bunion with cross over and hammertoe boney deformities noted bilateral. Muscular strength within normal limits without painon range of motion. + Fat pad atrophy bilateral. No pain with calf compression bilateral.  Assessment and Plan:  Problem List Items Addressed This Visit    None    Visit Diagnoses    Pain due to onychomycosis of toenails of both feet    -  Primary   Ingrowing nail       Bunion       Hammer toes of both feet          -Examined patient.  -Discussed treatment options for painful mycotic nails. -Mechanically debrided and reduced mycotic nails with sterile nail nipper and dremel nail file without incident. -Dispensed foam padding and recommend continue with wide good supportive shoes -Patient to return in 3 months for follow up evaluation or sooner if symptoms worsen.  Landis Martins, DPM

## 2017-03-04 NOTE — Progress Notes (Signed)
   Subjective:    Patient ID: Alice Wong, female    DOB: [birth year], 84 y.o.   MRN: [id number]  HPI    Review of Systems  Cardiovascular: Positive for chest pain.  All other systems reviewed and are negative.      Objective:   Physical Exam        Assessment & Plan:

## 2017-03-04 NOTE — Patient Instructions (Addendum)
Soak Instructions    THE DAY AFTER THE PROCEDURE  Place 1/4 cup of epsom salts in a quart of warm tap water. Continue to soak in the solution for 20 minutes.  This soak should be done twice a  week.    May use OTC icy hot or pain cream as needed for foot pain

## 2017-03-14 ENCOUNTER — Other Ambulatory Visit (INDEPENDENT_AMBULATORY_CARE_PROVIDER_SITE_OTHER): Payer: Medicare Other

## 2017-03-14 ENCOUNTER — Ambulatory Visit (INDEPENDENT_AMBULATORY_CARE_PROVIDER_SITE_OTHER): Payer: Medicare Other | Admitting: Internal Medicine

## 2017-03-14 ENCOUNTER — Encounter: Payer: Self-pay | Admitting: Internal Medicine

## 2017-03-14 DIAGNOSIS — IMO0002 Reserved for concepts with insufficient information to code with codable children: Secondary | ICD-10-CM

## 2017-03-14 DIAGNOSIS — R413 Other amnesia: Secondary | ICD-10-CM

## 2017-03-14 DIAGNOSIS — E1129 Type 2 diabetes mellitus with other diabetic kidney complication: Secondary | ICD-10-CM

## 2017-03-14 DIAGNOSIS — E1165 Type 2 diabetes mellitus with hyperglycemia: Secondary | ICD-10-CM | POA: Diagnosis not present

## 2017-03-14 DIAGNOSIS — F0630 Mood disorder due to known physiological condition, unspecified: Secondary | ICD-10-CM

## 2017-03-14 DIAGNOSIS — F0392 Unspecified dementia, unspecified severity, with psychotic disturbance: Secondary | ICD-10-CM

## 2017-03-14 DIAGNOSIS — F068 Other specified mental disorders due to known physiological condition: Secondary | ICD-10-CM

## 2017-03-14 DIAGNOSIS — F0390 Unspecified dementia without behavioral disturbance: Secondary | ICD-10-CM

## 2017-03-14 DIAGNOSIS — I10 Essential (primary) hypertension: Secondary | ICD-10-CM

## 2017-03-14 LAB — BASIC METABOLIC PANEL
BUN: 12 mg/dL (ref 6–23)
CO2: 30 mEq/L (ref 19–32)
Calcium: 9.6 mg/dL (ref 8.4–10.5)
Chloride: 102 mEq/L (ref 96–112)
Creatinine, Ser: 1.02 mg/dL (ref 0.40–1.20)
GFR: 55.33 mL/min — ABNORMAL LOW (ref >60.00)
Glucose, Bld: 152 mg/dL — ABNORMAL HIGH (ref 70–99)
Potassium: 3.8 mEq/L (ref 3.5–5.1)
Sodium: 141 mEq/L (ref 135–145)

## 2017-03-14 LAB — TSH: TSH: 4.18 u[IU]/mL (ref 0.35–4.50)

## 2017-03-14 LAB — HEMOGLOBIN A1C: Hgb A1c MFr Bld: 6 % (ref 4.6–6.5)

## 2017-03-14 NOTE — Assessment & Plan Note (Signed)
Metformin 

## 2017-03-14 NOTE — Assessment & Plan Note (Signed)
Has to be medicated

## 2017-03-14 NOTE — Assessment & Plan Note (Signed)
Chronic dementia Aricept

## 2017-03-14 NOTE — Progress Notes (Signed)
Subjective:  Patient ID: Alice Wong, female    DOB: 04-06-1934  Age: 83 y.o. MRN: [id number]  CC: No chief complaint on file.   HPI ESMIRNA RAVAN presents for dementia, HTN, HAs f/u. Better on Latuda, lost wt  Outpatient Medications Prior to Visit  Medication Sig Dispense Refill  . amLODipine (NORVASC) 5 MG tablet Take 1 tablet (5 mg total) daily by mouth. 90 tablet 1  . aspirin EC 81 MG EC tablet Take 1 tablet (81 mg total) by mouth daily.    . diphenoxylate-atropine (LOMOTIL) 2.5-0.025 MG tablet Take 1 tablet by mouth 4 (four) times daily as needed for diarrhea or loose stools. 60 tablet 1  . DULoxetine (CYMBALTA) 60 MG capsule Take 1 capsule (60 mg total) by mouth daily. 90 capsule 0  . Lurasidone HCl 60 MG TABS Take 1 tablet (60 mg total) by mouth daily with breakfast. 90 tablet 0  . metFORMIN (GLUCOPHAGE) 500 MG tablet Take 1 tablet (500 mg total) by mouth 2 (two) times daily with a meal. 180 tablet 3  . NAMZARIC 28-10 MG CP24 TAKE ONE CAPSULE BY MOUTH ONCE DAILY 90 capsule 3  . nitroGLYCERIN (NITROSTAT) 0.4 MG SL tablet DISSOLVE ONE TABLET UNDER THE TONGUE EVERY 5 MINUTES AS NEEDED FOR CHEST PAIN 25 tablet 0   No facility-administered medications prior to visit.     ROS Review of Systems  Constitutional: Positive for fatigue and unexpected weight change. Negative for activity change, appetite change and chills.  HENT: Negative for congestion, mouth sores and sinus pressure.   Eyes: Negative for visual disturbance.  Respiratory: Negative for cough and chest tightness.   Gastrointestinal: Negative for abdominal pain and nausea.  Genitourinary: Negative for difficulty urinating, frequency and vaginal pain.  Musculoskeletal: Negative for back pain and gait problem.  Skin: Negative for pallor and rash.  Neurological: Positive for dizziness, weakness, light-headedness and headaches. Negative for tremors and numbness.  Psychiatric/Behavioral: Positive for behavioral  problems, confusion and decreased concentration. Negative for sleep disturbance and suicidal ideas. The patient is nervous/anxious.     Objective:  BP 124/82 (BP Location: Left Arm, Patient Position: Sitting, Cuff Size: Large)   Pulse 66   Temp 97.8 F (36.6 C) (Oral)   Ht 5' 3.25" (1.607 m)   Wt 153 lb (69.4 kg)   SpO2 98%   BMI 26.89 kg/m   BP Readings from Last 3 Encounters:  03/14/17 124/82  03/04/17 130/81  02/21/17 118/66    Wt Readings from Last 3 Encounters:  03/14/17 153 lb (69.4 kg)  02/21/17 152 lb (68.9 kg)  12/13/16 161 lb (73 kg)    Physical Exam  Constitutional: She appears well-developed. No distress.  HENT:  Head: Normocephalic.  Right Ear: External ear normal.  Left Ear: External ear normal.  Nose: Nose normal.  Mouth/Throat: Oropharynx is clear and moist.  Eyes: Conjunctivae are normal. Pupils are equal, round, and reactive to light. Right eye exhibits no discharge. Left eye exhibits no discharge.  Neck: Normal range of motion. Neck supple. No JVD present. No tracheal deviation present. No thyromegaly present.  Cardiovascular: Normal rate, regular rhythm and normal heart sounds.  Pulmonary/Chest: No stridor. No respiratory distress. She has no wheezes.  Abdominal: Soft. Bowel sounds are normal. She exhibits no distension and no mass. There is no tenderness. There is no rebound and no guarding.  Musculoskeletal: She exhibits no edema or tenderness.  Lymphadenopathy:    She has no cervical adenopathy.  Neurological: She displays  normal reflexes. No cranial nerve deficit. She exhibits normal muscle tone. Coordination abnormal.  Skin: No rash noted. No erythema.  Psychiatric: She has a normal mood and affect. Her behavior is normal. Judgment and thought content normal.  apathetic  Lab Results  Component Value Date   WBC 4.9 01/19/2016   HGB 14.7 01/19/2016   HCT 41.6 01/19/2016   PLT 222.0 01/19/2016   GLUCOSE 165 (H) 12/13/2016   CHOL 166  08/23/2011   TRIG 115.0 08/23/2011   HDL 36.90 (L) 08/23/2011   LDLCALC 106 (H) 08/23/2011   ALT 15 12/13/2016   AST 13 12/13/2016   NA 139 12/13/2016   K 4.0 12/13/2016   CL 102 12/13/2016   CREATININE 0.99 12/13/2016   BUN 15 12/13/2016   CO2 30 12/13/2016   TSH 2.69 09/22/2015   INR 1.05 12/17/2013   HGBA1C 6.4 12/13/2016    Dg Chest 2 View  Result Date: 02/21/2017 CLINICAL DATA:  [place] 10 days ago with left-sided pain. EXAM: CHEST  2 VIEW COMPARISON:  02/15/2015 FINDINGS: Cardiomediastinal silhouette is normal except for some aortic atherosclerosis. The lungs are clear. The vascularity is normal. No effusions. No significant bone finding. IMPRESSION: No active disease.  No traumatic finding. Electronically Signed   By: Nelson Chimes M.D.   On: 02/21/2017 15:59    Assessment & Plan:   There are no diagnoses linked to this encounter. I am having Alice Wong maintain her aspirin, diphenoxylate-atropine, NAMZARIC, nitroGLYCERIN, metFORMIN, amLODipine, Lurasidone HCl, and DULoxetine.  No orders of the defined types were placed in this encounter.    Follow-up: No Follow-up on file.  Walker Kehr, MD

## 2017-03-14 NOTE — Assessment & Plan Note (Signed)
Losartan, amlodipine Orthostatic lightheadedness - hard to manage - the best we can do... Risks associated with treatment noncompliance were discussed. Compliance was encouraged.

## 2017-04-23 ENCOUNTER — Ambulatory Visit (INDEPENDENT_AMBULATORY_CARE_PROVIDER_SITE_OTHER): Payer: Medicare Other | Admitting: Psychiatry

## 2017-04-23 ENCOUNTER — Encounter (HOSPITAL_COMMUNITY): Payer: Self-pay | Admitting: Psychiatry

## 2017-04-23 DIAGNOSIS — R51 Headache: Secondary | ICD-10-CM | POA: Diagnosis not present

## 2017-04-23 DIAGNOSIS — F0630 Mood disorder due to known physiological condition, unspecified: Secondary | ICD-10-CM

## 2017-04-23 DIAGNOSIS — M549 Dorsalgia, unspecified: Secondary | ICD-10-CM

## 2017-04-23 DIAGNOSIS — R413 Other amnesia: Secondary | ICD-10-CM | POA: Diagnosis not present

## 2017-04-23 MED ORDER — DULOXETINE HCL 60 MG PO CPEP: 60.0000 mg | ORAL_CAPSULE | Freq: Every day | ORAL | 0 refills | Status: DC

## 2017-04-23 MED ORDER — LURASIDONE HCL 60 MG PO TABS: 60.0000 mg | ORAL_TABLET | Freq: Every day | ORAL | 0 refills | Status: DC

## 2017-04-23 NOTE — Progress Notes (Signed)
BH MD/PA/NP OP Progress Note  04/23/2017 2:00 PM Alice Wong  MRN:  [id number]  Chief Complaint: I have headaches.  HPI: Patient came for her follow-up appointment with her daughter and translator.  According to her daughter patient is doing much better.  She is sleeping good.  She denies any irritability rage, anger or any mood swing.  She still have a lot of somatic complaints and sometimes she is preoccupied with her headaches but she is more calm and does not argue with her neighbors.  On her last visit we increase Latuda.  She has compliant with medication and denies any side effects.  She has mild tremors which are chronic.  Patient recently seen her primary care physician and she had blood work.  Her hemoglobin A1c is better.  Patient has memory problem and we have recommended to see neurology however her primary care physician has not referred to see a neurologist yet.  Patient admitted that she noticed her memory has been getting worse.  Her attention, concentration is poor.  Patient has 3 hours of home health aide to help her ADLs.  Patient does not speak English and most of the information was obtained through translator and her daughter.  Patient denies any paranoia, hallucination but sometimes she gets scared when she is by herself.  Her appetite is okay.  Her energy level is fair.  Visit Diagnosis:    ICD-10-CM   1. Mood disorder in conditions classified elsewhere F06.30 Lurasidone HCl 60 MG TABS    DULoxetine (CYMBALTA) 60 MG capsule    Past Psychiatric History: Reviewed. Patient took Prozac from primary care physician for 7-8 years for depression and anxiety symptoms. She do not recall any inpatient psychiatric treatment or any suicidal attempt. Patient has history of paranoia, delusion, agitation, mood swings, anger, irritability.We tried Depakote 500 mg and Thorazine 25 mg but with limited response.She had a good response with Zyprexa 10 mg butit was discontinued due to  high hemoglobin A1c and weight gain.   Past Medical History:  Past Medical History:  Diagnosis Date  . Anxiety   . Cholelithiasis   . CVA (cerebral vascular accident) ([place])   . Depression   . Diverticulosis   . GERD (gastroesophageal reflux disease)   . Gout 2009  . H. pylori infection 2011  . Hyperlipidemia   . Hypertension   . Memory loss   . MR (mitral regurgitation)   . Osteoporosis   . S/P cardiac cath 12/20/13 12/20/2013  . Thrombocytopenia ([place])   . Thyroid cyst   . Vaso-vagal reaction   . Vitamin D deficiency     Past Surgical History:  Procedure Laterality Date  . CESAREAN SECTION    . CHOLECYSTECTOMY  2010  . LASIK    . LEFT HEART CATHETERIZATION WITH CORONARY ANGIOGRAM N/A 12/20/2013   Procedure: LEFT HEART CATHETERIZATION WITH CORONARY ANGIOGRAM;  Surgeon: Burnell Blanks, MD;  Location: [facility] C[facility] LAB;  Service: Cardiovascular;  Laterality: N/A;    Family Psychiatric History: Reviewed.  Family History:  Family History  Problem Relation Age of Onset  . Lung disease Father        Deceased  . Hypertension Other   . Stomach cancer Brother   . Ulcers Son   . Colon cancer Neg Hx     Social History:  Social History   Socioeconomic History  . Marital status: Widowed    Spouse name: Not on file  . Number of children: 3  . Years of  education: Not on file  . Highest education level: Not on file  Occupational History  . Occupation: RETIRED  Social Needs  . Financial resource strain: Not on file  . Food insecurity:    Worry: Not on file    Inability: Not on file  . Transportation needs:    Medical: Not on file    Non-medical: Not on file  Tobacco Use  . Smoking status: Never Smoker  . Smokeless tobacco: Never Used  Substance and Sexual Activity  . Alcohol use: No    Alcohol/week: 0.0 oz  . Drug use: No    Types: Methylphenidate  . Sexual activity: Never  Lifestyle  . Physical activity:    Days per week: Not on file    Minutes per  session: Not on file  . Stress: Not on file  Relationships  . Social connections:    Talks on phone: Not on file    Gets together: Not on file    Attends religious service: Not on file    Active member of club or organization: Not on file    Attends meetings of clubs or organizations: Not on file    Relationship status: Not on file  Other Topics Concern  . Not on file  Social History Narrative   Family History Hypertension   No caffeine    Lives with husband in a 2 story home.  Retired.    Moved from [place] in 2002    Allergies:  Allergies  Allergen Reactions  . Aricept [Donepezil Hcl]     ?dry tongue  . Coreg [Carvedilol]     ?problems  . Morphine Sulfate Other (See Comments)    REACTION: decreased blood pressure    Metabolic Disorder Labs: Recent Results (from the past 2160 hour(s))  TSH     Status: None   Collection Time: 03/14/17  8:13 AM  Result Value Ref Range   TSH 4.18 0.35 - 4.50 uIU/mL  Basic metabolic panel     Status: Abnormal   Collection Time: 03/14/17  8:13 AM  Result Value Ref Range   Sodium 141 135 - 145 mEq/L   Potassium 3.8 3.5 - 5.1 mEq/L   Chloride 102 96 - 112 mEq/L   CO2 30 19 - 32 mEq/L   Glucose, Bld 152 (H) 70 - 99 mg/dL   BUN 12 6 - 23 mg/dL   Creatinine, Ser 1.02 0.40 - 1.20 mg/dL   Calcium 9.6 8.4 - 10.5 mg/dL   GFR 55.33 (L) >60.00 mL/min  Hemoglobin A1c     Status: None   Collection Time: 03/14/17  8:13 AM  Result Value Ref Range   Hgb A1c MFr Bld 6.0 4.6 - 6.5 %    Comment: Glycemic Control Guidelines for People with Diabetes:Non Diabetic:  <6%Goal of Therapy: <7%Additional Action Suggested:  >8%    Lab Results  Component Value Date   HGBA1C 6.0 03/14/2017   No results found for: PROLACTIN Lab Results  Component Value Date   CHOL 166 08/23/2011   TRIG 115.0 08/23/2011   HDL 36.90 (L) 08/23/2011   CHOLHDL 4 08/23/2011   VLDL 23.0 08/23/2011   LDLCALC 106 (H) 08/23/2011   LDLCALC 109 (H) 01/02/2010   Lab Results   Component Value Date   TSH 4.18 03/14/2017   TSH 2.69 09/22/2015    Therapeutic Level Labs: No results found for: LITHIUM No results found for: VALPROATE No components found for:  CBMZ  Current Medications: Current Outpatient Medications  Medication  Sig Dispense Refill  . amLODipine (NORVASC) 5 MG tablet Take 1 tablet (5 mg total) daily by mouth. 90 tablet 1  . aspirin EC 81 MG EC tablet Take 1 tablet (81 mg total) by mouth daily.    . diphenoxylate-atropine (LOMOTIL) 2.5-0.025 MG tablet Take 1 tablet by mouth 4 (four) times daily as needed for diarrhea or loose stools. 60 tablet 1  . DULoxetine (CYMBALTA) 60 MG capsule Take 1 capsule (60 mg total) by mouth daily. 90 capsule 0  . Lurasidone HCl 60 MG TABS Take 1 tablet (60 mg total) by mouth daily with breakfast. 90 tablet 0  . metFORMIN (GLUCOPHAGE) 500 MG tablet Take 1 tablet (500 mg total) by mouth 2 (two) times daily with a meal. 180 tablet 3  . NAMZARIC 28-10 MG CP24 TAKE ONE CAPSULE BY MOUTH ONCE DAILY 90 capsule 3  . nitroGLYCERIN (NITROSTAT) 0.4 MG SL tablet DISSOLVE ONE TABLET UNDER THE TONGUE EVERY 5 MINUTES AS NEEDED FOR CHEST PAIN 25 tablet 0   No current facility-administered medications for this visit.      Musculoskeletal: Strength & Muscle Tone: within normal limits Gait & Station: normal Patient leans: N/A  Psychiatric Specialty Exam: Review of Systems  Constitutional: Negative.   Musculoskeletal: Positive for back pain.  Skin: Negative.   Neurological: Positive for headaches.  Psychiatric/Behavioral: Positive for memory loss.    Blood pressure 138/82, pulse 81, height 5\' 3"  (1.6 m), weight 153 lb (69.4 kg), SpO2 96 %.There is no height or weight on file to calculate BMI.  General Appearance: Casual  Eye Contact:  Fair  Speech:  Slow  Volume:  Decreased  Mood:  Anxious and Dysphoric  Affect:  Constricted  Thought Process:  Goal Directed  Orientation:  Full (Time, Place, and Person)  Thought Content:  Rumination and Preoccupied with her somatic complaints especially headaches.   Suicidal Thoughts:  No  Homicidal Thoughts:  No  Memory:  Immediate;   Fair Recent;   Fair Remote;   Fair  Judgement:  Fair  Insight:  Fair  Psychomotor Activity:  Tremor  Concentration:  Concentration: Fair and Attention Span: Fair  Recall:  [employer] of Knowledge: Fair  Language: Fair  Akathisia:  No  Handed:  Right  AIMS (if indicated): not done  Assets:  Desire for Improvement Housing Social Support  ADL's:  Intact  Cognition: Impaired,  Mild  Sleep:  improved   Screenings: PHQ2-9     Office Visit from 02/21/2017 in [facility] from 05/19/2015 in [facility] Visit from 08/23/2014 in [facility] Total Score  2  6  0  PHQ-9 Total Score  12  11  -       Assessment and Plan: Major depressive disorder, recurrent.  Mood disorder due to general medical condition.  Cognitive disorder NOS.  I reviewed records from other providers including recent blood work results.  Patient doing much better since we increased the Latuda 60 mg.  She is more calm and less paranoid.  However her memory continued to decline and one more time I encourage she should see neurologist.  Patient has somatic complaints but they are stable.  Continue Latuda 60 mg daily and Cymbalta 60 mg daily.  Recommended to call us back if she has any question or any concern.  Follow-up in 3 months.   Kathlee Nations, MD 04/23/2017, 2:00 PM

## 2017-04-24 ENCOUNTER — Ambulatory Visit (HOSPITAL_COMMUNITY): Payer: Self-pay | Admitting: Psychiatry

## 2017-05-30 DIAGNOSIS — H43812 Vitreous degeneration, left eye: Secondary | ICD-10-CM | POA: Diagnosis not present

## 2017-05-30 DIAGNOSIS — Z961 Presence of intraocular lens: Secondary | ICD-10-CM | POA: Diagnosis not present

## 2017-05-30 DIAGNOSIS — H353131 Nonexudative age-related macular degeneration, bilateral, early dry stage: Secondary | ICD-10-CM | POA: Diagnosis not present

## 2017-05-30 DIAGNOSIS — H35033 Hypertensive retinopathy, bilateral: Secondary | ICD-10-CM | POA: Diagnosis not present

## 2017-06-03 ENCOUNTER — Encounter: Payer: Self-pay | Admitting: Sports Medicine

## 2017-06-03 ENCOUNTER — Ambulatory Visit (INDEPENDENT_AMBULATORY_CARE_PROVIDER_SITE_OTHER): Payer: Medicare Other | Admitting: Sports Medicine

## 2017-06-03 DIAGNOSIS — M21619 Bunion of unspecified foot: Secondary | ICD-10-CM

## 2017-06-03 DIAGNOSIS — B351 Tinea unguium: Secondary | ICD-10-CM

## 2017-06-03 DIAGNOSIS — M2042 Other hammer toe(s) (acquired), left foot: Secondary | ICD-10-CM

## 2017-06-03 DIAGNOSIS — M79674 Pain in right toe(s): Secondary | ICD-10-CM | POA: Diagnosis not present

## 2017-06-03 DIAGNOSIS — M79675 Pain in left toe(s): Secondary | ICD-10-CM

## 2017-06-03 DIAGNOSIS — M2041 Other hammer toe(s) (acquired), right foot: Secondary | ICD-10-CM

## 2017-06-03 NOTE — Progress Notes (Signed)
Subjective: Alice Wong is a 83 y.o. female patient seen today in office with complaint of mildly painful thickened and elongated toenail unable to trim and pain to bunions and hammertoe. Patient has not been using bunion pads and dispensed last visit.  Has been taking Tylenol PRN and has questions about if bunions can be taken off. No other issues noted.     Patient Active Problem List   Diagnosis Date Noted  . Chest wall contusion 02/21/2017  . Diarrhea 09/22/2015  . Redness of eye, right 02/15/2015  . Noncompliance with medication regimen 11/15/2014  . 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 Cerebral infarction ([place]) 07/21/2014  . Sternum pain 04/21/2014  . Costochondritis 04/13/2014  . Simple chronic bronchitis ([place]) 04/13/2014  . Cough 03/22/2014  . S/P cardiac cath 12/20/13, non obsteructive disease 12/20/2013  . Abnormal nuclear stress test, false positive per cath   . Vasovagal syncope 12/17/2013  . Midsternal chest pain 12/17/2013  . Neoplasm of uncertain behavior of skin 10/27/2013  . Uncontrolled type 2 diabetes with renal manifestation ([place]) 11/04/2012  . Pain of right thumb 12/20/2011  . Psychosomatic disorder 08/23/2011  . Tinnitus 06/04/2011  . Headache, post-traumatic, chronic 05/01/2011  . LBP (low back pain) 01/30/2011  . Insomnia 11/19/2010  . Actinic keratoses 05/23/2010  . Dizziness 04/09/2010  . Bruising 04/09/2010  . Thrombocytopenia ([place]) 01/02/2010  . HEMORRHOIDS 12/12/2009  . HELICOBACTER PYLORI INFECTION 03/17/2009  . PARESTHESIA 03/17/2009  . Chest pain, unspecified 08/01/2008  . Vitamin D deficiency 03/03/2008  . GAIT IMBALANCE 10/22/2007  . Gout, unspecified 05/07/2007  . FOOT PAIN 05/07/2007  . Dyslipidemia 02/05/2007  . Anxiety state 02/05/2007  . Depression with anxiety 02/05/2007  . Essential hypertension 02/05/2007  . GERD 02/05/2007  . OSTEOPOROSIS 02/05/2007  . Memory loss 02/05/2007  .  History of cardiovascular disorder 02/05/2007    Current Outpatient Medications on File Prior to Visit  Medication Sig Dispense Refill  . amLODipine (NORVASC) 5 MG tablet Take 1 tablet (5 mg total) daily by mouth. 90 tablet 1  . aspirin EC 81 MG EC tablet Take 1 tablet (81 mg total) by mouth daily.    . diphenoxylate-atropine (LOMOTIL) 2.5-0.025 MG tablet Take 1 tablet by mouth 4 (four) times daily as needed for diarrhea or loose stools. 60 tablet 1  . DULoxetine (CYMBALTA) 60 MG capsule Take 1 capsule (60 mg total) by mouth daily. 90 capsule 0  . Lurasidone HCl 60 MG TABS Take 1 tablet (60 mg total) by mouth daily with breakfast. 90 tablet 0  . metFORMIN (GLUCOPHAGE) 500 MG tablet Take 1 tablet (500 mg total) by mouth 2 (two) times daily with a meal. 180 tablet 3  . NAMZARIC 28-10 MG CP24 TAKE ONE CAPSULE BY MOUTH ONCE DAILY 90 capsule 3  . nitroGLYCERIN (NITROSTAT) 0.4 MG SL tablet DISSOLVE ONE TABLET UNDER THE TONGUE EVERY 5 MINUTES AS NEEDED FOR CHEST PAIN 25 tablet 0   No current facility-administered medications on file prior to visit.     Allergies  Allergen Reactions  . Aricept [Donepezil Hcl]     ?dry tongue  . Coreg [Carvedilol]     ?problems  . Morphine Sulfate Other (See Comments)    REACTION: decreased blood pressure    Objective: Physical Exam  General: Well developed, nourished, no acute distress, awake, alert and oriented x 3  Vascular: Dorsalis pedis artery 1/4 bilateral, Posterior tibial artery 1/4 bilateral, skin temperature warm to warm proximal  to distal bilateral lower extremities, + varicosities, Scant pedal hair present bilateral.  Neurological: Gross sensation present via light touch bilateral.   Dermatological: Skin is warm, dry, and supple bilateral, Nails 1-10 are tender, long, thick, and discolored with mild subungal debris, no acute ingrowing however per patient and home aide nail digs in on both big toes, no webspace macerations present bilateral, no  open lesions present bilateral, no callus/corns/hyperkeratotic tissue present bilateral. No signs of infection bilateral.  Musculoskeletal: End stage bunion with cross over and hammertoe boney deformities noted bilateral. Muscular strength within normal limits without painon range of motion. + Fat pad atrophy bilateral. No pain with calf compression bilateral.  Assessment and Plan:  Problem List Items Addressed This Visit    None    Visit Diagnoses    Pain due to onychomycosis of toenails of both feet    -  Primary   Bunion       Hammer toes of both feet          -Examined patient.  -Discussed treatment options for painful mycotic nails. -Mechanically debrided and reduced mycotic nails with sterile nail nipper and dremel nail file without incident. -Re-Dispensed foam padding and recommend continue with wide good supportive shoes; patient is NOT a candidate for surgery  -Patient to return in 3 months for follow up evaluation or sooner if symptoms worsen.  Landis Martins, DPM

## 2017-06-03 NOTE — Patient Instructions (Signed)
Get over the counter, Aspercreme rub to feet and legs up to 4 times per day for pain

## 2017-06-08 ENCOUNTER — Other Ambulatory Visit: Payer: Self-pay | Admitting: Internal Medicine

## 2017-06-17 ENCOUNTER — Ambulatory Visit: Payer: Self-pay | Admitting: Internal Medicine

## 2017-07-07 ENCOUNTER — Other Ambulatory Visit: Payer: Self-pay | Admitting: Internal Medicine

## 2017-07-19 ENCOUNTER — Encounter: Payer: Self-pay | Admitting: Internal Medicine

## 2017-07-23 ENCOUNTER — Ambulatory Visit (HOSPITAL_COMMUNITY): Payer: Self-pay | Admitting: Psychiatry

## 2017-08-15 ENCOUNTER — Ambulatory Visit (INDEPENDENT_AMBULATORY_CARE_PROVIDER_SITE_OTHER): Payer: Medicare Other | Admitting: Internal Medicine

## 2017-08-15 ENCOUNTER — Encounter: Payer: Self-pay | Admitting: Internal Medicine

## 2017-08-15 ENCOUNTER — Other Ambulatory Visit: Payer: Self-pay | Admitting: Internal Medicine

## 2017-08-15 ENCOUNTER — Other Ambulatory Visit (INDEPENDENT_AMBULATORY_CARE_PROVIDER_SITE_OTHER): Payer: Medicare Other

## 2017-08-15 DIAGNOSIS — F068 Other specified mental disorders due to known physiological condition: Secondary | ICD-10-CM | POA: Diagnosis not present

## 2017-08-15 DIAGNOSIS — E1165 Type 2 diabetes mellitus with hyperglycemia: Secondary | ICD-10-CM

## 2017-08-15 DIAGNOSIS — F0390 Unspecified dementia without behavioral disturbance: Secondary | ICD-10-CM

## 2017-08-15 DIAGNOSIS — R159 Full incontinence of feces: Secondary | ICD-10-CM | POA: Diagnosis not present

## 2017-08-15 DIAGNOSIS — F0630 Mood disorder due to known physiological condition, unspecified: Secondary | ICD-10-CM

## 2017-08-15 DIAGNOSIS — IMO0002 Reserved for concepts with insufficient information to code with codable children: Secondary | ICD-10-CM

## 2017-08-15 DIAGNOSIS — E1129 Type 2 diabetes mellitus with other diabetic kidney complication: Secondary | ICD-10-CM | POA: Diagnosis not present

## 2017-08-15 DIAGNOSIS — E559 Vitamin D deficiency, unspecified: Secondary | ICD-10-CM | POA: Diagnosis not present

## 2017-08-15 DIAGNOSIS — R413 Other amnesia: Secondary | ICD-10-CM

## 2017-08-15 DIAGNOSIS — F0392 Unspecified dementia, unspecified severity, with psychotic disturbance: Secondary | ICD-10-CM

## 2017-08-15 DIAGNOSIS — R55 Syncope and collapse: Secondary | ICD-10-CM

## 2017-08-15 DIAGNOSIS — I10 Essential (primary) hypertension: Secondary | ICD-10-CM | POA: Diagnosis not present

## 2017-08-15 LAB — URINALYSIS, ROUTINE W REFLEX MICROSCOPIC
Bilirubin Urine: NEGATIVE
Hgb urine dipstick: NEGATIVE
Ketones, ur: NEGATIVE
Nitrite: NEGATIVE
RBC / HPF: NONE SEEN (ref 0–?)
Specific Gravity, Urine: 1.025 (ref 1.000–1.030)
Total Protein, Urine: NEGATIVE
Urine Glucose: NEGATIVE
Urobilinogen, UA: 1 (ref 0.0–1.0)
pH: 6 (ref 5.0–8.0)

## 2017-08-15 LAB — CBC WITH DIFFERENTIAL/PLATELET
Basophils Absolute: 0 10*3/uL (ref 0.0–0.1)
Basophils Relative: 0.5 % (ref 0.0–3.0)
Eosinophils Absolute: 0 10*3/uL (ref 0.0–0.7)
Eosinophils Relative: 0.4 % (ref 0.0–5.0)
HCT: 39.6 % (ref 36.0–46.0)
Hemoglobin: 14 g/dL (ref 12.0–15.0)
Lymphocytes Relative: 18.3 % (ref 12.0–46.0)
Lymphs Abs: 1.3 10*3/uL (ref 0.7–4.0)
MCHC: 35.4 g/dL (ref 30.0–36.0)
MCV: 91.8 fl (ref 78.0–100.0)
Monocytes Absolute: 0.5 10*3/uL (ref 0.1–1.0)
Monocytes Relative: 6.4 % (ref 3.0–12.0)
Neutro Abs: 5.3 10*3/uL (ref 1.4–7.7)
Neutrophils Relative %: 74.4 % (ref 43.0–77.0)
Platelets: 241 10*3/uL (ref 150.0–400.0)
RBC: 4.32 Mil/uL (ref 3.87–5.11)
RDW: 12.6 % (ref 11.5–15.5)
WBC: 7.1 10*3/uL (ref 4.0–10.5)

## 2017-08-15 LAB — HEPATIC FUNCTION PANEL
ALT: 8 U/L (ref 0–35)
AST: 11 U/L (ref 0–37)
Albumin: 4.3 g/dL (ref 3.5–5.2)
Alkaline Phosphatase: 64 U/L (ref 39–117)
Bilirubin, Direct: 0.1 mg/dL (ref 0.0–0.3)
Total Bilirubin: 0.6 mg/dL (ref 0.2–1.2)
Total Protein: 6.8 g/dL (ref 6.0–8.3)

## 2017-08-15 LAB — BASIC METABOLIC PANEL
BUN: 23 mg/dL (ref 6–23)
CO2: 30 mEq/L (ref 19–32)
Calcium: 9.6 mg/dL (ref 8.4–10.5)
Chloride: 101 mEq/L (ref 96–112)
Creatinine, Ser: 1.02 mg/dL (ref 0.40–1.20)
GFR: 55.27 mL/min — ABNORMAL LOW (ref >60.00)
Glucose, Bld: 116 mg/dL — ABNORMAL HIGH (ref 70–99)
Potassium: 4.1 mEq/L (ref 3.5–5.1)
Sodium: 139 mEq/L (ref 135–145)

## 2017-08-15 LAB — TSH: TSH: 1.82 u[IU]/mL (ref 0.35–4.50)

## 2017-08-15 LAB — VITAMIN D 25 HYDROXY (VIT D DEFICIENCY, FRACTURES): VITD: 17.92 ng/mL — ABNORMAL LOW (ref 30.00–100.00)

## 2017-08-15 LAB — HEMOGLOBIN A1C: Hgb A1c MFr Bld: 5.8 % (ref 4.6–6.5)

## 2017-08-15 MED ORDER — MEMANTINE HCL 10 MG PO TABS: 10.0000 mg | ORAL_TABLET | Freq: Two times a day (BID) | ORAL | 11 refills | Status: DC

## 2017-08-15 MED ORDER — VITAMIN D3 1.25 MG (50000 UT) PO CAPS: 1.0000 | ORAL_CAPSULE | ORAL | 0 refills | Status: DC

## 2017-08-15 MED ORDER — VITAMIN D3 50 MCG (2000 UT) PO CAPS: 2000.0000 [IU] | ORAL_CAPSULE | Freq: Every day | ORAL | 3 refills | Status: DC

## 2017-08-15 NOTE — Assessment & Plan Note (Signed)
Chronic labile Losartan, amlodipine Orthostatic lightheadedness - hard to manage - the best we can do.Marland KitchenMarland Kitchen

## 2017-08-15 NOTE — Assessment & Plan Note (Signed)
No change 

## 2017-08-15 NOTE — Assessment & Plan Note (Signed)
Metformin 

## 2017-08-15 NOTE — Patient Instructions (Signed)
Stop [employer]

## 2017-08-15 NOTE — Assessment & Plan Note (Signed)
Needs a lot of supervision. Zyprexa Dr Adele Schilder

## 2017-08-15 NOTE — Progress Notes (Signed)
Subjective:  Patient ID: Alice Wong, female    DOB: 1934/03/13  Age: 83 y.o. MRN: [id number]  CC: No chief complaint on file.   HPI Alice Wong presents for dementia, tremor, HAs, psychotic traits f/u  Outpatient Medications Prior to Visit  Medication Sig Dispense Refill  . amLODipine (NORVASC) 5 MG tablet TAKE 1 TABLET BY MOUTH EVERY DAY 90 tablet 0  . aspirin EC 81 MG EC tablet Take 1 tablet (81 mg total) by mouth daily.    . diphenoxylate-atropine (LOMOTIL) 2.5-0.025 MG tablet Take 1 tablet by mouth 4 (four) times daily as needed for diarrhea or loose stools. 60 tablet 1  . DULoxetine (CYMBALTA) 60 MG capsule Take 1 capsule (60 mg total) by mouth daily. 90 capsule 0  . Lurasidone HCl 60 MG TABS Take 1 tablet (60 mg total) by mouth daily with breakfast. 90 tablet 0  . metFORMIN (GLUCOPHAGE) 500 MG tablet Take 1 tablet (500 mg total) by mouth 2 (two) times daily with a meal. 180 tablet 3  . NAMZARIC 28-10 MG CP24 TAKE ONE CAPSULE BY MOUTH EVERY DAY 90 capsule 1  . nitroGLYCERIN (NITROSTAT) 0.4 MG SL tablet DISSOLVE ONE TABLET UNDER THE TONGUE EVERY 5 MINUTES AS NEEDED FOR CHEST PAIN 25 tablet 0   No facility-administered medications prior to visit.     ROS: Review of Systems  Constitutional: Positive for fatigue. Negative for activity change, appetite change, chills and unexpected weight change.  HENT: Negative for congestion, mouth sores and sinus pressure.   Eyes: Negative for visual disturbance.  Respiratory: Negative for cough and chest tightness.   Gastrointestinal: Negative for abdominal pain and nausea.  Genitourinary: Positive for frequency. Negative for difficulty urinating and vaginal pain.  Musculoskeletal: Positive for back pain. Negative for gait problem.  Skin: Negative for pallor and rash.  Neurological: Negative for dizziness, tremors, weakness, numbness and headaches.  Psychiatric/Behavioral: Positive for behavioral problems, confusion, decreased  concentration and dysphoric mood. Negative for sleep disturbance and suicidal ideas. The patient is nervous/anxious.     Objective:  BP 118/72 (BP Location: Left Arm, Patient Position: Sitting, Cuff Size: Normal)   Pulse 75   Temp 98.5 F (36.9 C) (Oral)   Ht 5\' 3"  (1.6 m)   Wt 147 lb (66.7 kg)   SpO2 96%   BMI 26.04 kg/m   BP Readings from Last 3 Encounters:  08/15/17 118/72  03/14/17 124/82  03/04/17 130/81    Wt Readings from Last 3 Encounters:  08/15/17 147 lb (66.7 kg)  03/14/17 153 lb (69.4 kg)  02/21/17 152 lb (68.9 kg)    Physical Exam  Constitutional: She appears well-developed. No distress.  HENT:  Head: Normocephalic.  Right Ear: External ear normal.  Left Ear: External ear normal.  Nose: Nose normal.  Mouth/Throat: Oropharynx is clear and moist.  Eyes: Pupils are equal, round, and reactive to light. Conjunctivae are normal. Right eye exhibits no discharge. Left eye exhibits no discharge.  Neck: Normal range of motion. Neck supple. No JVD present. No tracheal deviation present. No thyromegaly present.  Cardiovascular: Normal rate, regular rhythm and normal heart sounds.  Pulmonary/Chest: No stridor. No respiratory distress. She has no wheezes.  Abdominal: Soft. Bowel sounds are normal. She exhibits no distension and no mass. There is no tenderness. There is no rebound and no guarding.  Musculoskeletal: She exhibits no edema or tenderness.  Lymphadenopathy:    She has no cervical adenopathy.  Neurological: She displays normal reflexes. No cranial nerve  deficit. She exhibits normal muscle tone. Coordination normal.  Skin: No rash noted. No erythema.  Psychiatric: Judgment and thought content normal.  a/o confused  Lab Results  Component Value Date   WBC 4.9 01/19/2016   HGB 14.7 01/19/2016   HCT 41.6 01/19/2016   PLT 222.0 01/19/2016   GLUCOSE 152 (H) 03/14/2017   CHOL 166 08/23/2011   TRIG 115.0 08/23/2011   HDL 36.90 (L) 08/23/2011   LDLCALC 106  (H) 08/23/2011   ALT 15 12/13/2016   AST 13 12/13/2016   NA 141 03/14/2017   K 3.8 03/14/2017   CL 102 03/14/2017   CREATININE 1.02 03/14/2017   BUN 12 03/14/2017   CO2 30 03/14/2017   TSH 4.18 03/14/2017   INR 1.05 12/17/2013   HGBA1C 6.0 03/14/2017    Dg Chest 2 View  Result Date: 02/21/2017 CLINICAL DATA:  [place] 10 days ago with left-sided pain. EXAM: CHEST  2 VIEW COMPARISON:  02/15/2015 FINDINGS: Cardiomediastinal silhouette is normal except for some aortic atherosclerosis. The lungs are clear. The vascularity is normal. No effusions. No significant bone finding. IMPRESSION: No active disease.  No traumatic finding. Electronically Signed   By: Nelson Chimes M.D.   On: 02/21/2017 15:59    Assessment & Plan:   There are no diagnoses linked to this encounter.   No orders of the defined types were placed in this encounter.    Follow-up: No follow-ups on file.  Walker Kehr, MD

## 2017-08-15 NOTE — Assessment & Plan Note (Signed)
d/c Namzaric and start [employer]

## 2017-08-15 NOTE — Assessment & Plan Note (Signed)
Vit D 

## 2017-09-02 ENCOUNTER — Ambulatory Visit: Payer: Medicare Other | Admitting: Sports Medicine

## 2017-09-14 ENCOUNTER — Other Ambulatory Visit (HOSPITAL_COMMUNITY): Payer: Self-pay | Admitting: Psychiatry

## 2017-09-14 ENCOUNTER — Other Ambulatory Visit: Payer: Self-pay | Admitting: Internal Medicine

## 2017-09-14 DIAGNOSIS — F0630 Mood disorder due to known physiological condition, unspecified: Secondary | ICD-10-CM

## 2017-09-23 ENCOUNTER — Ambulatory Visit: Payer: Medicare Other | Admitting: Sports Medicine

## 2017-09-30 ENCOUNTER — Ambulatory Visit: Payer: Medicare Other | Admitting: Sports Medicine

## 2017-09-30 ENCOUNTER — Other Ambulatory Visit: Payer: Self-pay | Admitting: Internal Medicine

## 2017-10-02 ENCOUNTER — Ambulatory Visit (INDEPENDENT_AMBULATORY_CARE_PROVIDER_SITE_OTHER): Payer: Medicare Other | Admitting: Psychiatry

## 2017-10-02 ENCOUNTER — Encounter (HOSPITAL_COMMUNITY): Payer: Self-pay | Admitting: Psychiatry

## 2017-10-02 VITALS — BP 130/78 | Ht 65.0 in | Wt 147.0 lb

## 2017-10-02 DIAGNOSIS — F29 Unspecified psychosis not due to a substance or known physiological condition: Secondary | ICD-10-CM | POA: Diagnosis not present

## 2017-10-02 DIAGNOSIS — F0630 Mood disorder due to known physiological condition, unspecified: Secondary | ICD-10-CM | POA: Diagnosis not present

## 2017-10-02 MED ORDER — OLANZAPINE 10 MG PO TABS: 10.0000 mg | ORAL_TABLET | Freq: Every day | ORAL | 0 refills | Status: DC

## 2017-10-02 MED ORDER — DULOXETINE HCL 60 MG PO CPEP: 60.0000 mg | ORAL_CAPSULE | Freq: Every day | ORAL | 0 refills | Status: DC
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
## 2017-10-03 ENCOUNTER — Other Ambulatory Visit: Payer: Self-pay | Admitting: Internal Medicine

## 2017-10-06 ENCOUNTER — Ambulatory Visit (HOSPITAL_COMMUNITY): Payer: Self-pay | Admitting: Psychiatry

## 2017-10-21 ENCOUNTER — Ambulatory Visit (INDEPENDENT_AMBULATORY_CARE_PROVIDER_SITE_OTHER): Payer: Medicare Other | Admitting: Sports Medicine

## 2017-10-21 ENCOUNTER — Encounter: Payer: Self-pay | Admitting: Sports Medicine

## 2017-10-21 DIAGNOSIS — M2041 Other hammer toe(s) (acquired), right foot: Secondary | ICD-10-CM

## 2017-10-21 DIAGNOSIS — M21619 Bunion of unspecified foot: Secondary | ICD-10-CM

## 2017-10-21 DIAGNOSIS — M79675 Pain in left toe(s): Secondary | ICD-10-CM | POA: Diagnosis not present

## 2017-10-21 DIAGNOSIS — M79674 Pain in right toe(s): Secondary | ICD-10-CM

## 2017-10-21 DIAGNOSIS — M2042 Other hammer toe(s) (acquired), left foot: Secondary | ICD-10-CM

## 2017-10-21 DIAGNOSIS — B351 Tinea unguium: Secondary | ICD-10-CM | POA: Diagnosis not present

## 2017-10-21 NOTE — Progress Notes (Signed)
Subjective: Alice Wong is a 83 y.o. female patient seen today in office with complaint of mildly painful thickened and elongated toenail unable to trim and pain to bunions and hammertoe. Patient is assisted by interpreter this visit. No other issues noted.     Patient Active Problem List   Diagnosis Date Noted  . Incontinence of bowel 08/15/2017  . Chest wall contusion 02/21/2017  . Diarrhea 09/22/2015  . Redness of eye, right 02/15/2015  . Noncompliance with medication regimen 11/15/2014  . 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 Cerebral infarction ([place]) 07/21/2014  . Sternum pain 04/21/2014  . Costochondritis 04/13/2014  . Simple chronic bronchitis ([place]) 04/13/2014  . Cough 03/22/2014  . S/P cardiac cath 12/20/13, non obsteructive disease 12/20/2013  . Abnormal nuclear stress test, false positive per cath   . Vasovagal syncope 12/17/2013  . Midsternal chest pain 12/17/2013  . Neoplasm of uncertain behavior of skin 10/27/2013  . Uncontrolled type 2 diabetes with renal manifestation ([place]) 11/04/2012  . Pain of right thumb 12/20/2011  . Psychosomatic disorder 08/23/2011  . Tinnitus 06/04/2011  . Headache, post-traumatic, chronic 05/01/2011  . LBP (low back pain) 01/30/2011  . Insomnia 11/19/2010  . Actinic keratoses 05/23/2010  . Dizziness 04/09/2010  . Bruising 04/09/2010  . Thrombocytopenia ([place]) 01/02/2010  . HEMORRHOIDS 12/12/2009  . HELICOBACTER PYLORI INFECTION 03/17/2009  . PARESTHESIA 03/17/2009  . Chest pain, unspecified 08/01/2008  . Vitamin D deficiency 03/03/2008  . GAIT IMBALANCE 10/22/2007  . Gout, unspecified 05/07/2007  . FOOT PAIN 05/07/2007  . Dyslipidemia 02/05/2007  . Anxiety state 02/05/2007  . Depression with anxiety 02/05/2007  . Essential hypertension 02/05/2007  . GERD 02/05/2007  . OSTEOPOROSIS 02/05/2007  . Memory loss 02/05/2007  . History of cardiovascular disorder 02/05/2007    Current  Outpatient Medications on File Prior to Visit  Medication Sig Dispense Refill  . amLODipine (NORVASC) 5 MG tablet TAKE 1 TABLET BY MOUTH EVERY DAY 90 tablet 3  . aspirin EC 81 MG EC tablet Take 1 tablet (81 mg total) by mouth daily.    . Cholecalciferol (VITAMIN D3) 2000 units capsule Take 1 capsule (2,000 Units total) by mouth daily. 100 capsule 3  . Cholecalciferol (VITAMIN D3) 50000 units CAPS Take 1 capsule by mouth once a week. 6 capsule 0  . diphenoxylate-atropine (LOMOTIL) 2.5-0.025 MG tablet Take 1 tablet by mouth 4 (four) times daily as needed for diarrhea or loose stools. 60 tablet 1  . DULoxetine (CYMBALTA) 60 MG capsule Take 1 capsule (60 mg total) by mouth daily. 90 capsule 0  . memantine (NAMENDA) 10 MG tablet TAKE 1 TABLET BY MOUTH TWICE A DAY 180 tablet 2  . metFORMIN (GLUCOPHAGE) 500 MG tablet TAKE 1 TABLET BY MOUTH TWICE A DAY WITH MEALS 180 tablet 1  . nitroGLYCERIN (NITROSTAT) 0.4 MG SL tablet DISSOLVE ONE TABLET UNDER THE TONGUE EVERY 5 MINUTES AS NEEDED FOR CHEST PAIN 25 tablet 0  . OLANZapine (ZYPREXA) 10 MG tablet Take 1 tablet (10 mg total) by mouth at bedtime. 30 tablet 0   No current facility-administered medications on file prior to visit.     Allergies  Allergen Reactions  . Aricept [Donepezil Hcl]     ?dry tongue  . Coreg [Carvedilol]     ?problems  . Morphine Sulfate Other (See Comments)    REACTION: decreased blood pressure    Objective: Physical Exam  General: Well developed, nourished, no acute distress, awake, alert and oriented x  3  Vascular: Dorsalis pedis artery 1/4 bilateral, Posterior tibial artery 1/4 bilateral, skin temperature warm to warm proximal to distal bilateral lower extremities, + varicosities, Scant pedal hair present bilateral.  Neurological: Gross sensation present via light touch bilateral.   Dermatological: Skin is warm, dry, and supple bilateral, Nails 1-10 are tender, long, thick, and discolored with mild subungal debris,   no webspace macerations present bilateral, no open lesions present bilateral, no callus/corns/hyperkeratotic tissue present bilateral. No signs of infection bilateral.  Musculoskeletal: End stage bunion with cross over and hammertoe boney deformities noted bilateral. Muscular strength within normal limits without painon range of motion. + Fat pad atrophy bilateral. No pain with calf compression bilateral.  Assessment and Plan:  Problem List Items Addressed This Visit    None    Visit Diagnoses    Pain due to onychomycosis of toenails of both feet    -  Primary   Bunion       Hammer toes of both feet          -Examined patient.  -Discussed treatment options for painful mycotic nails. -Mechanically debrided and reduced mycotic nails with sterile nail nipper and dremel nail file without incident. -Re-Dispensed foam padding and recommend continue with wide good supportive shoes even though patient still comes to office with shoes that are hard leather and without wearing her bunion cushions; patient is NOT a candidate for surgery due to age and current medical status -Patient to return in 3 months for follow up evaluation or sooner if symptoms worsen.  Landis Martins, DPM

## 2017-10-22 ENCOUNTER — Other Ambulatory Visit: Payer: Self-pay | Admitting: Internal Medicine

## 2017-10-24 ENCOUNTER — Other Ambulatory Visit (HOSPITAL_COMMUNITY): Payer: Self-pay | Admitting: Psychiatry

## 2017-10-24 DIAGNOSIS — F29 Unspecified psychosis not due to a substance or known physiological condition: Secondary | ICD-10-CM

## 2017-11-02 ENCOUNTER — Other Ambulatory Visit (HOSPITAL_COMMUNITY): Payer: Self-pay | Admitting: Psychiatry

## 2017-11-02 DIAGNOSIS — F29 Unspecified psychosis not due to a substance or known physiological condition: Secondary | ICD-10-CM

## 2017-11-06 ENCOUNTER — Ambulatory Visit (INDEPENDENT_AMBULATORY_CARE_PROVIDER_SITE_OTHER): Payer: Medicare Other | Admitting: Psychiatry

## 2017-11-06 ENCOUNTER — Encounter (HOSPITAL_COMMUNITY): Payer: Self-pay | Admitting: Psychiatry

## 2017-11-06 VITALS — BP 132/78 | Ht 65.0 in | Wt 158.0 lb

## 2017-11-06 DIAGNOSIS — F0630 Mood disorder due to known physiological condition, unspecified: Secondary | ICD-10-CM | POA: Diagnosis not present

## 2017-11-06 DIAGNOSIS — R4189 Other symptoms and signs involving cognitive functions and awareness: Secondary | ICD-10-CM

## 2017-11-06 DIAGNOSIS — F29 Unspecified psychosis not due to a substance or known physiological condition: Secondary | ICD-10-CM

## 2017-11-06 MED ORDER — OLANZAPINE 10 MG PO TABS: ORAL_TABLET | ORAL | 0 refills | Status: DC

## 2017-11-06 MED ORDER — DULOXETINE HCL 60 MG PO CPEP: 60.0000 mg | ORAL_CAPSULE | Freq: Every day | ORAL | 0 refills | Status: DC

## 2017-11-06 NOTE — Progress Notes (Signed)
BH MD/PA/NP OP Progress Note  11/06/2017 3:09 PM Alice Wong  MRN:  [id number]  Chief Complaint: I am sleeping better but I have headache.  HPI: Patient came for her follow-up appointment with her translator.  I decided not to be present in the room as she wanted her mother to talk with daughter presents.  However I did talk to the daughter in the lobby and she pleased that Zyprexa working very well.  She has no more agitation, severe anger, confusion and she is sleeping better.  She also endorsed that has no more episodes of wandering and she did not skip from the home.  Patient also presented much pleasant and she smiled at me and happy to see me in the room.  However she is still preoccupied with her somatic complaint mostly chronic pain headaches.  Patient has poor memory and she easily forgetful but she admitted her sleep is improved and she is eating better.  She has gained 10 pounds since the Zyprexa started.  I have discussed medication side effects with the patient and her daughter and also recommended to have her blood work done regularly.  We have tried other medication but that did not work.  Patient and her daughter agreed that she should continue Zyprexa which is helping her agitation confusion paranoia and aggression.  Since taking the Zyprexa she is no more having issues with other family member and she is not paranoid anymore.  She has no tremors, shakes or any EPS.  She is on amantadine.  Patient is scheduled to see Dr. Alain Marion for her blood work.  Patient denies any hallucination or any suicidal thoughts.  Visit Diagnosis:    ICD-10-CM   1. Psychosis, unspecified psychosis type ([place]) F29 OLANZapine (ZYPREXA) 10 MG tablet  2. Mood disorder in conditions classified elsewhere F06.30 DULoxetine (CYMBALTA) 60 MG capsule  3. Cognitive impairment R41.89     Past Psychiatric History: Reviewed Patient took Prozac from primary care physician for past 7-8 years for depression and  anxiety symptoms. She do not recall any inpatient psychiatric treatment or any suicidal attempt. She admitted irritability, anger and mood swings but symptoms started to get worse in past few months. Patient denies any history of psychosis. We tried Depakote 500 mg but patient did not see any improvement with the medication.  She had a good response with Zyprexa.  We tried [place] but that did not work very well.  Past Medical History:  Past Medical History:  Diagnosis Date  . Anxiety   . Cholelithiasis   . CVA (cerebral vascular accident) ([place])   . Depression   . Diverticulosis   . GERD (gastroesophageal reflux disease)   . Gout 2009  . H. pylori infection 2011  . Hyperlipidemia   . Hypertension   . Memory loss   . MR (mitral regurgitation)   . Osteoporosis   . S/P cardiac cath 12/20/13 12/20/2013  . Thrombocytopenia ([place])   . Thyroid cyst   . Vaso-vagal reaction   . Vitamin D deficiency     Past Surgical History:  Procedure Laterality Date  . CESAREAN SECTION    . CHOLECYSTECTOMY  2010  . LASIK    . LEFT HEART CATHETERIZATION WITH CORONARY ANGIOGRAM N/A 12/20/2013   Procedure: LEFT HEART CATHETERIZATION WITH CORONARY ANGIOGRAM;  Surgeon: Burnell Blanks, MD;  Location: [facility] CATH LAB;  Service: Cardiovascular;  Laterality: N/A;    Family Psychiatric History: Reviewed  Family History:  Family History  Problem Relation Age  of Onset  . Lung disease Father        Deceased  . Hypertension Other   . Stomach cancer Brother   . Ulcers Son   . Colon cancer Neg Hx     Social History:  Social History   Socioeconomic History  . Marital status: Widowed    Spouse name: Not on file  . Number of children: 3  . Years of education: Not on file  . Highest education level: Not on file  Occupational History  . Occupation: RETIRED  Social Needs  . Financial resource strain: Not on file  . Food insecurity:    Worry: Not on file    Inability: Not on file  . Transportation  needs:    Medical: Not on file    Non-medical: Not on file  Tobacco Use  . Smoking status: Never Smoker  . Smokeless tobacco: Never Used  Substance and Sexual Activity  . Alcohol use: No    Alcohol/week: 0.0 standard drinks  . Drug use: No    Types: Methylphenidate  . Sexual activity: Never  Lifestyle  . Physical activity:    Days per week: Not on file    Minutes per session: Not on file  . Stress: Not on file  Relationships  . Social connections:    Talks on phone: Not on file    Gets together: Not on file    Attends religious service: Not on file    Active member of club or organization: Not on file    Attends meetings of clubs or organizations: Not on file    Relationship status: Not on file  Other Topics Concern  . Not on file  Social History Narrative   Family History Hypertension   No caffeine    Lives with husband in a 2 story home.  Retired.    Moved from [place] in 2002    Allergies:  Allergies  Allergen Reactions  . Aricept [Donepezil Hcl]     ?dry tongue  . Coreg [Carvedilol]     ?problems  . Morphine Sulfate Other (See Comments)    REACTION: decreased blood pressure    Metabolic Disorder Labs: Lab Results  Component Value Date   HGBA1C 5.8 08/15/2017   No results found for: PROLACTIN Lab Results  Component Value Date   CHOL 166 08/23/2011   TRIG 115.0 08/23/2011   HDL 36.90 (L) 08/23/2011   CHOLHDL 4 08/23/2011   VLDL 23.0 08/23/2011   LDLCALC 106 (H) 08/23/2011   LDLCALC 109 (H) 01/02/2010   Lab Results  Component Value Date   TSH 1.82 08/15/2017   TSH 4.18 03/14/2017    Therapeutic Level Labs: No results found for: LITHIUM No results found for: VALPROATE No components found for:  CBMZ  Current Medications: Current Outpatient Medications  Medication Sig Dispense Refill  . amLODipine (NORVASC) 5 MG tablet TAKE 1 TABLET BY MOUTH EVERY DAY 90 tablet 3  . aspirin EC 81 MG EC tablet Take 1 tablet (81 mg total) by mouth daily.    .  Cholecalciferol (VITAMIN D3) 2000 units capsule Take 1 capsule (2,000 Units total) by mouth daily. 100 capsule 3  . Cholecalciferol (VITAMIN D3) 50000 units CAPS Take 1 capsule by mouth once a week. 6 capsule 0  . diphenoxylate-atropine (LOMOTIL) 2.5-0.025 MG tablet Take 1 tablet by mouth 4 (four) times daily as needed for diarrhea or loose stools. 60 tablet 1  . DULoxetine (CYMBALTA) 60 MG capsule Take 1 capsule (60 mg  total) by mouth daily. 90 capsule 0  . memantine (NAMENDA) 10 MG tablet TAKE 1 TABLET BY MOUTH TWICE A DAY 180 tablet 2  . metFORMIN (GLUCOPHAGE) 500 MG tablet TAKE 1 TABLET BY MOUTH TWICE A DAY WITH MEALS 180 tablet 1  . nitroGLYCERIN (NITROSTAT) 0.4 MG SL tablet DISSOLVE ONE TABLET UNDER THE TONGUE EVERY 5 MINUTES AS NEEDED FOR CHEST PAIN 25 tablet 0  . OLANZapine (ZYPREXA) 10 MG tablet TAKE 1 TABLET BY MOUTH EVERYDAY AT BEDTIME 30 tablet 0   No current facility-administered medications for this visit.      Musculoskeletal: Strength & Muscle Tone: within normal limits Gait & Station: better from past Patient leans: N/A  Psychiatric Specialty Exam: Review of Systems  Constitutional: Negative for weight loss.  Psychiatric/Behavioral: Positive for memory loss.    Blood pressure 132/78, height 5\' 5"  (1.651 m), weight 158 lb (71.7 kg).There is no height or weight on file to calculate BMI.  General Appearance: Casual  Eye Contact:  Fair  Speech:  Slow  Volume:  Normal  Mood:  Anxious  Affect:  Congruent  Thought Process:  Descriptions of Associations: Loose  Orientation:  Other:  alert and oriented x 2  Thought Content: Rumination and preocupied with her somatic complaints   Suicidal Thoughts:  No  Homicidal Thoughts:  No  Memory:  Immediate;   Poor Recent;   Poor Remote;   Fair  Judgement:  Fair  Insight:  Shallow  Psychomotor Activity:  Decreased  Concentration:  Concentration: Poor and Attention Span: Poor  Recall:  Poor  Fund of Knowledge: Fair  Language:  Fair  Akathisia:  No  Handed:  Right  AIMS (if indicated): not done  Assets:  Housing  ADL's:  Impaired  Cognition: Impaired,  Moderate  Sleep:  improved   Screenings: PHQ2-9     Office Visit from 02/21/2017 in [place] from 05/19/2015 in [place] Visit from 08/23/2014 in [facility] at [facility] Total Score  2  6  0  PHQ-9 Total Score  12  11  -       Assessment and Plan: Psychosis NOS, cognitive disorder NOS.  Mood disorder due to general medical condition.  Patient doing better on Zyprexa however concerns of weight gain and high blood sugar and I recommended she should see her primary care physician if medicine for her diabetes need to be adjusted.  She is also scheduled to see neurology for memory impairment and appointment is coming in few weeks.  She is tolerating Zyprexa better than Latuda.  I will continue Cymbalta 60 mg daily and Zyprexa 10 mg at bedtime.  Recommended to call us back if is any question or any concern.  Follow-up in 3 months.  Discussed safety concerns at any time having active suicidal thoughts or homicidal thought that she need to call 911 or go to local emergency room.  Treatment plan explained to the patient with the help of translator and also to her daughter and both people agreed with the plan.   Kathlee Nations, MD 11/06/2017, 3:09 PM

## 2017-11-20 ENCOUNTER — Ambulatory Visit: Payer: Self-pay | Admitting: Internal Medicine

## 2017-11-22 ENCOUNTER — Other Ambulatory Visit: Payer: Self-pay | Admitting: Internal Medicine

## 2017-11-24 NOTE — Telephone Encounter (Signed)
pls advise if ok to refill../lmb 

## 2017-12-01 ENCOUNTER — Emergency Department (HOSPITAL_COMMUNITY)
Admission: EM | Admit: 2017-12-01 | Discharge: 2017-12-09 | Disposition: A | Discharge status: Home / Self Care | Payer: Medicare Other | Attending: Emergency Medicine | Admitting: Emergency Medicine

## 2017-12-01 ENCOUNTER — Encounter (HOSPITAL_COMMUNITY): Payer: Self-pay | Admitting: Emergency Medicine

## 2017-12-01 DIAGNOSIS — Z008 Encounter for other general examination: Secondary | ICD-10-CM | POA: Diagnosis not present

## 2017-12-01 DIAGNOSIS — F22 Delusional disorders: Secondary | ICD-10-CM

## 2017-12-01 DIAGNOSIS — F0391 Unspecified dementia with behavioral disturbance: Secondary | ICD-10-CM

## 2017-12-01 DIAGNOSIS — Z7982 Long term (current) use of aspirin: Secondary | ICD-10-CM | POA: Insufficient documentation

## 2017-12-01 DIAGNOSIS — R45851 Suicidal ideations: Secondary | ICD-10-CM | POA: Diagnosis not present

## 2017-12-01 DIAGNOSIS — F329 Major depressive disorder, single episode, unspecified: Secondary | ICD-10-CM | POA: Diagnosis present

## 2017-12-01 DIAGNOSIS — G309 Alzheimer's disease, unspecified: Secondary | ICD-10-CM | POA: Diagnosis not present

## 2017-12-01 DIAGNOSIS — I440 Atrioventricular block, first degree: Secondary | ICD-10-CM | POA: Diagnosis not present

## 2017-12-01 DIAGNOSIS — F0281 Dementia in other diseases classified elsewhere with behavioral disturbance: Secondary | ICD-10-CM | POA: Diagnosis not present

## 2017-12-01 DIAGNOSIS — Z79899 Other long term (current) drug therapy: Secondary | ICD-10-CM | POA: Insufficient documentation

## 2017-12-01 DIAGNOSIS — F333 Major depressive disorder, recurrent, severe with psychotic symptoms: Secondary | ICD-10-CM | POA: Insufficient documentation

## 2017-12-01 DIAGNOSIS — F0390 Unspecified dementia without behavioral disturbance: Secondary | ICD-10-CM | POA: Diagnosis present

## 2017-12-01 DIAGNOSIS — Z046 Encounter for general psychiatric examination, requested by authority: Secondary | ICD-10-CM | POA: Diagnosis not present

## 2017-12-01 DIAGNOSIS — I10 Essential (primary) hypertension: Secondary | ICD-10-CM | POA: Diagnosis not present

## 2017-12-01 LAB — COMPREHENSIVE METABOLIC PANEL
ALT: 17 U/L (ref 0–44)
AST: 19 U/L (ref 15–41)
Albumin: 4.3 g/dL (ref 3.5–5.0)
Alkaline Phosphatase: 69 U/L (ref 38–126)
Anion gap: 9 (ref 5–15)
BUN: 28 mg/dL — ABNORMAL HIGH (ref 8–23)
CO2: 28 mmol/L (ref 22–32)
Calcium: 9.2 mg/dL (ref 8.9–10.3)
Chloride: 105 mmol/L (ref 98–111)
Creatinine, Ser: 0.95 mg/dL (ref 0.44–1.00)
GFR calc Af Amer: >60 mL/min (ref >60)
GFR calc non Af Amer: 55 mL/min — ABNORMAL LOW (ref >60)
Glucose, Bld: 180 mg/dL — ABNORMAL HIGH (ref 70–99)
Potassium: 3.7 mmol/L (ref 3.5–5.1)
Sodium: 142 mmol/L (ref 135–145)
Total Bilirubin: 0.7 mg/dL (ref 0.3–1.2)
Total Protein: 7 g/dL (ref 6.5–8.1)

## 2017-12-01 LAB — CBC
HCT: 39.8 % (ref 36.0–46.0)
Hemoglobin: 13.3 g/dL (ref 12.0–15.0)
MCH: 31.4 pg (ref 26.0–34.0)
MCHC: 33.4 g/dL (ref 30.0–36.0)
MCV: 93.9 fL (ref 80.0–100.0)
Platelets: 252 10*3/uL (ref 150–400)
RBC: 4.24 MIL/uL (ref 3.87–5.11)
RDW: 12.2 % (ref 11.5–15.5)
WBC: 6.8 10*3/uL (ref 4.0–10.5)
nRBC: 0 % (ref 0.0–0.2)

## 2017-12-01 LAB — SALICYLATE LEVEL: Salicylate Lvl: <7 mg/dL (ref 2.8–30.0)

## 2017-12-01 LAB — ACETAMINOPHEN LEVEL: Acetaminophen (Tylenol), Serum: <10 ug/mL — ABNORMAL LOW (ref 10–30)

## 2017-12-01 LAB — ETHANOL: Alcohol, Ethyl (B): <10 mg/dL (ref <10)

## 2017-12-01 MED ORDER — ZIPRASIDONE MESYLATE 20 MG IM SOLR
10.0000 mg | Freq: Once | INTRAMUSCULAR | Status: AC
  Administered 2017-12-01: 10 mg via INTRAMUSCULAR
  Filled 2017-12-01: qty 20

## 2017-12-01 MED ORDER — LORAZEPAM 2 MG/ML IJ SOLN
1.0000 mg | Freq: Once | INTRAMUSCULAR | Status: AC
  Administered 2017-12-01: 1 mg via INTRAMUSCULAR
  Filled 2017-12-01: qty 1

## 2017-12-01 MED ORDER — AMLODIPINE BESYLATE 5 MG PO TABS
5.0000 mg | ORAL_TABLET | Freq: Every day | ORAL | Status: DC
  Administered 2017-12-02 – 2017-12-09 (×8): 5 mg via ORAL
  Filled 2017-12-01 (×8): qty 1

## 2017-12-01 MED ORDER — METFORMIN HCL 500 MG PO TABS
500.0000 mg | ORAL_TABLET | Freq: Two times a day (BID) | ORAL | Status: DC
  Administered 2017-12-02 – 2017-12-09 (×14): 500 mg via ORAL
  Filled 2017-12-01 (×15): qty 1

## 2017-12-01 MED ORDER — VITAMIN D3 50 MCG (2000 UT) PO CAPS: 2000.0000 [IU] | ORAL_CAPSULE | Freq: Every day | ORAL | Status: DC

## 2017-12-01 MED ORDER — VITAMIN D3 25 MCG (1000 UNIT) PO TABS
2000.0000 [IU] | ORAL_TABLET | Freq: Every day | ORAL | Status: DC
  Administered 2017-12-02 – 2017-12-09 (×8): 2000 [IU] via ORAL
  Filled 2017-12-01 (×8): qty 2

## 2017-12-01 MED ORDER — OLANZAPINE 10 MG PO TABS
10.0000 mg | ORAL_TABLET | Freq: Every day | ORAL | Status: DC
  Administered 2017-12-04 – 2017-12-08 (×6): 10 mg via ORAL
  Filled 2017-12-01 (×9): qty 1

## 2017-12-01 MED ORDER — MEMANTINE HCL 10 MG PO TABS
10.0000 mg | ORAL_TABLET | Freq: Two times a day (BID) | ORAL | Status: DC
  Administered 2017-12-02 – 2017-12-09 (×14): 10 mg via ORAL
  Filled 2017-12-01 (×16): qty 1

## 2017-12-01 MED ORDER — STERILE WATER FOR INJECTION IJ SOLN
INTRAMUSCULAR | Status: AC
  Filled 2017-12-01: qty 10

## 2017-12-01 MED ORDER — ASPIRIN EC 81 MG PO TBEC
81.0000 mg | DELAYED_RELEASE_TABLET | Freq: Every day | ORAL | Status: DC
  Administered 2017-12-02 – 2017-12-09 (×8): 81 mg via ORAL
  Filled 2017-12-01 (×8): qty 1

## 2017-12-01 MED ORDER — DULOXETINE HCL 30 MG PO CPEP
60.0000 mg | ORAL_CAPSULE | Freq: Every day | ORAL | Status: DC
  Administered 2017-12-02 – 2017-12-09 (×8): 60 mg via ORAL
  Filled 2017-12-01 (×8): qty 2

## 2017-12-01 NOTE — ED Notes (Signed)
Bed: [facility]PT4 Expected date:  Expected time:  Means of arrival:  Comments: 

## 2017-12-01 NOTE — ED Notes (Signed)
Pt has been seen and wanded by security. Belongings x2 at nurses station
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
## 2017-12-01 NOTE — ED Notes (Signed)
Pt anxious, wandering since her daughter left ER.  Pt walked to bathroom by charge RN.  This RN walked with patient throughout ED.  Patient provided with juice, snacks.  Patient easily redirected.  Pt talking in [place].  Attempt made to use wall-e for video translation unsuccessful as patient does not reply to person on the ipad when interpreter asks questions.
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
## 2017-12-01 NOTE — ED Notes (Signed)
Pt has become increasingly anxious.  Pt moved to TCU and patient spoke to daughter on phone which calmed patient down for a short time.  Patient became Patient attempting to run away from staff members, frightened, uncooperative with treatment, and physically combative.  Report given to TCU RN regarding patient's behavior warranting IVC.

## 2017-12-01 NOTE — ED Notes (Signed)
Pt is still unable to provide urine sample at this time.  Per daughter, pt has ambulated several times to the bathroom, but has not produced urine.

## 2017-12-01 NOTE — ED Notes (Signed)
Pt walked to BR and attempted to give urine sample with no luck.

## 2017-12-01 NOTE — ED Triage Notes (Signed)
Pt brought in by [facility] under IVC papers taken out by daughter that states: "A danger to self and others, to [place]: Diagnosed with unspecified psychosis, mood disorder, dementia; threatens to slash her own throat or hang herself; believes someone is in the house when she is alone; walks in the road with traffic swerving around her, waving to drivers." Pt speaks [place].

## 2017-12-01 NOTE — BH Assessment (Signed)
Clinician was contact by ED staff inquiring about pt's [facility] Assessment. Stated that pt's assessment will be completed shortly, as clinician is almost done with the one she was in the process of typing, though clinician noted that pt is still not in a room. ED staff stated pt was being put in a room and that pt's daughter had to leave, as she had been up since 0530. ED staff stated that another ED staff personnel, Lilia Pro, had been told by TTS personnel that pt's assessment would be done at 1900. Clinician stated that she had not been passed that information and that, had that information been passed to clinician, she would have assessed pt before the other patients. ED staff stated pt is unable to be consoled, as her daughter left, and that she was now walking around the ED and could not be re-directed, so could clinician please do the assessment so pt could be medicated. Clinician stated she would come to pt's room immediately.

## 2017-12-01 NOTE — ED Notes (Signed)
Patient's daughter states she can not wait any longer-states to call her with any questions Daughter is [name] [phone]

## 2017-12-02 DIAGNOSIS — F333 Major depressive disorder, recurrent, severe with psychotic symptoms: Secondary | ICD-10-CM | POA: Diagnosis not present

## 2017-12-02 LAB — RAPID URINE DRUG SCREEN, HOSP PERFORMED
Amphetamines: NOT DETECTED
Barbiturates: NOT DETECTED
Benzodiazepines: POSITIVE — AB
Cocaine: NOT DETECTED
Opiates: NOT DETECTED
Tetrahydrocannabinol: NOT DETECTED

## 2017-12-02 LAB — URINALYSIS, ROUTINE W REFLEX MICROSCOPIC
Bacteria, UA: NONE SEEN
Bilirubin Urine: NEGATIVE
Glucose, UA: NEGATIVE mg/dL
Hgb urine dipstick: NEGATIVE
Ketones, ur: 5 mg/dL — AB
Nitrite: NEGATIVE
Protein, ur: NEGATIVE mg/dL
Specific Gravity, Urine: 1.015 (ref 1.005–1.030)
pH: 7 (ref 5.0–8.0)

## 2017-12-02 MED ORDER — OLANZAPINE 10 MG IM SOLR
5.0000 mg | Freq: Once | INTRAMUSCULAR | Status: AC
  Administered 2017-12-02: 5 mg via INTRAMUSCULAR
  Filled 2017-12-02: qty 10

## 2017-12-02 MED ORDER — ZIPRASIDONE MESYLATE 20 MG IM SOLR: 10.0000 mg | Freq: Once | INTRAMUSCULAR | Status: DC

## 2017-12-02 MED ORDER — LORAZEPAM 1 MG PO TABS
1.0000 mg | ORAL_TABLET | Freq: Once | ORAL | Status: AC
  Administered 2017-12-02: 1 mg via ORAL
  Filled 2017-12-02: qty 1

## 2017-12-02 MED ORDER — TRAZODONE HCL 100 MG PO TABS
100.0000 mg | ORAL_TABLET | Freq: Once | ORAL | Status: AC
  Administered 2017-12-02: 100 mg via ORAL
  Filled 2017-12-02: qty 1

## 2017-12-02 MED ORDER — STERILE WATER FOR INJECTION IJ SOLN
INTRAMUSCULAR | Status: AC
  Administered 2017-12-02: 13:00:00
  Filled 2017-12-02: qty 10

## 2017-12-02 MED ORDER — LORAZEPAM 2 MG/ML IJ SOLN
1.0000 mg | Freq: Once | INTRAMUSCULAR | Status: AC
  Administered 2017-12-02: 1 mg via INTRAMUSCULAR
  Filled 2017-12-02: qty 1

## 2017-12-02 MED ORDER — STERILE WATER FOR INJECTION IJ SOLN
INTRAMUSCULAR | Status: AC
  Administered 2017-12-02: 10:00:00
  Filled 2017-12-02: qty 10

## 2017-12-02 NOTE — ED Notes (Signed)
Pt becomingly increasingly agitated, anxious, this nurse notified Dr.Norman Awaiting orders.

## 2017-12-02 NOTE — ED Notes (Addendum)
This nurse in room with [place]. [place] interpretor utilized. Pt forwards little  with interpretor. Not forward with meaningful information. Pt compliant with medication. Encouragement and support provided. Safety sitter present and assisting pt with breakfast.

## 2017-12-02 NOTE — ED Notes (Signed)
Pt trying to exit bed and room at this time. Sitter at bedside at this time. Pt safety maintained.

## 2017-12-02 NOTE — ED Notes (Signed)
Pt resting in assigned room at this time. No acute distress noted. Pt safety maintained at this time.

## 2017-12-02 NOTE — ED Notes (Signed)
Pt remains very restless. This nurse notified EDP. Awaiting orders.

## 2017-12-02 NOTE — ED Notes (Signed)
Pt becoming increasingly agitated, trying to elope, behavior not redirectable, [place]alle interpretor in use, Pt forwards little.  per interpretor pt's limited words are not making sense/uncomprehendable. This nurse notified Dr.Norman made aware.

## 2017-12-02 NOTE — ED Notes (Signed)
Pt in bed, quiet at this time. Sitter at bedside. No acute distress noted. Pt safety maintained at this time.

## 2017-12-02 NOTE — ED Notes (Signed)
Pt in assigned room restless at this time. No acute distress noted. Pt safety maintained at this time.

## 2017-12-02 NOTE — BH Assessment (Signed)
Pt presents lying in bed, curled awkwardly, holding head slightly up from pillow. Pt's eyes are open are looking around but she appears to mostly look through people in room. Pt occasionally responds to her name by making eye contact, but she mostly did not respond to her own name. When pt acknowledged this Probation officer, she spoke a few sentences in [place] & disengaged. Pt presents with flat affect.

## 2017-12-02 NOTE — BH Assessment (Signed)
TTS assessment completed with assistance from pt's daughter, Rasheen Schewe. Leilani Merl, DO recommends geropsychiatric hospitalization for this pt

## 2017-12-02 NOTE — ED Notes (Addendum)
Pt remains anxious, restless. This nurse notified EDP. Awaiting orders. Safety sitter present

## 2017-12-02 NOTE — ED Notes (Signed)
Pt resting in assigned room at this time. Pt bathed at this time. No acute distress noted. Pt safety maintained at this time.

## 2017-12-02 NOTE — ED Notes (Signed)
MD consulted at this time for PRN medication orders.

## 2017-12-02 NOTE — ED Notes (Signed)
Pt in assigned room; restless at this time. No acute distress noted. Pt safety maintained at this time.

## 2017-12-02 NOTE — ED Notes (Signed)
Pt transferred to unit. Pt does not speak [place]. Patient was not able to use interpreter device. Hosptilaist who speaks [place] helped to explain to pt that she was staying here overnight. Pt was clearly anxious. Hospitalist stated that patient was not speaking in complete comprehensible sentences. Pt refused to stay in room and continuously tried to elope. Pt became physically aggressive with staff and continued to be very fearful despite all attempts to calm patient. Staff called pt's daughter and this further agitated pt. Pt had to be given medication to calm her as reflected in the [facility].  Pt began reporting left sided chest pain during this event. Due to this an EKG was obtained. Pt was unable to articulate specifics regarding her pain. She later stated that she hurt all over her body. Pt had to be restrained in order to obtain this EKG because she continued to refuse to stay in room or sit on the bed. Pt has a history of attempting to run away from her house at home and attempt to run into traffic. Pt is unable to contract for safety. Comfort measures were implemented.

## 2017-12-02 NOTE — ED Notes (Signed)
Pt awake at this time. Pt drowsy but easily redirected. Bathroom and fluids offered at this time.

## 2017-12-02 NOTE — BH Assessment (Signed)
Tele Assessment Note   Patient Name: Alice Wong MRN: [id number] Referring Physician: Dorie Rank, MD Location of Patient: [facility] Location of Provider: [place] is a widowed 83 y.o. female involuntarily to [facility] accompanied by her daughter. Daughter reports concerns re: pt's depression and suicidal gestures. Pt has a history of Depression & Psychosis NOS. Dtr reports pt has been compliant with her medication since dtr switched to delivering pt's meds crushed in applesauce. Dtr reports concern pt's leaving home & standing in busy hwy are a suicidal gesture, not dementia sx. Dtr reports pt has been returned home after being found by neighbors &/or police on [address] over 20 times. Dtr reports about 6 years ago, twice she found pt passed out with empty pill bottles nearby. Pt speaks [place], but using interpreter device not helpful as pt mostly just looks past people in the room & does not interact. [place] speaking MD met c pt & states pt was not making sense when she was speaking [place]. Pt spoke a couple times in [place] to this Probation officer, but otherwise did not participate with conversation or acknowledge this Probation officer. Dtr reports pt has made homicidal threats to her and thrown items at her. Dtr has her kitchen knives hidden from pt. Pt lives with her dtr, and supports include her dtr. Pt's spouse died 3 years ago. Abuse, trauma & family MH hx are unknown to dtr. Pt has poor insight & judgment. Pt's OP history includes tx with Dr. Adele Schilder. Last appt was 11/06/17 when it was noted pt was doing well on Zyprexa. No IP history noted. No alcohol/ substance abuse reported. ? MSE: Pt is dressed in scrubs, quiet-alert, with incoherent speech and slowed motor behavior. Eye contact is poor. Pt's mood is depressed and affect is flat. Affect is congruent with mood. Thought process is incoherent and irrelevant. Pt interacted very little but was pleasant throughout  assessment.  Disposition: Leilani Merl, DO, recommends gero-psychiatric hospitalization Diagnosis: F33.3 Major Depressive Disorder, recurrent, severe with psychotic sx  Past Medical History:  Past Medical History:  Diagnosis Date  . Anxiety   . Cholelithiasis   . CVA (cerebral vascular accident) ([place])   . Depression   . Diverticulosis   . GERD (gastroesophageal reflux disease)   . Gout 2009  . H. pylori infection 2011  . Hyperlipidemia   . Hypertension   . Memory loss   . MR (mitral regurgitation)   . Osteoporosis   . S/P cardiac cath 12/20/13 12/20/2013  . Thrombocytopenia ([place])   . Thyroid cyst   . Vaso-vagal reaction   . Vitamin D deficiency     Past Surgical History:  Procedure Laterality Date  . CESAREAN SECTION    . CHOLECYSTECTOMY  2010  . LASIK    . LEFT HEART CATHETERIZATION WITH CORONARY ANGIOGRAM N/A 12/20/2013   Procedure: LEFT HEART CATHETERIZATION WITH CORONARY ANGIOGRAM;  Surgeon: Burnell Blanks, MD;  Location: [facility] CATH LAB;  Service: Cardiovascular;  Laterality: N/A;    Family History:  Family History  Problem Relation Age of Onset  . Lung disease Father        Deceased  . Hypertension Other   . Stomach cancer Brother   . Ulcers Son   . Colon cancer Neg Hx     Social History:  reports that she has never smoked. She has never used smokeless tobacco. She reports that she does not drink alcohol or use drugs.  Additional Social History:  CIWA: CIWA-Ar BP: (!) 170/105 Pulse Rate: (!) 107 COWS:    Allergies:  Allergies  Allergen Reactions  . Aricept [Donepezil Hcl]     ?dry tongue  . Coreg [Carvedilol]     ?problems  . Morphine Sulfate Other (See Comments)    REACTION: decreased blood pressure    Home Medications:  (Not in a hospital admission)  OB/GYN Status:  No LMP recorded. Patient is postmenopausal.  General Assessment Data Assessment unable to be completed: Yes Reason for not completing assessment: Pt is experiencing  dementia and does not understand why she is in the ED; she cannot follow directions or answer questions. Pt is currently wandering around the ED and is unable to answer questions to complete the [facility] Assessment. Location of Assessment: [facility] ED TTS Assessment: In system Is this a Tele or Face-to-Face Assessment?: Face-to-Face Is this an Initial Assessment or a Re-assessment for this encounter?: Initial Assessment Language Other than English: Yes What is your preferred language: Other (Comment: Enter the language)(Russian) Living Arrangements: Other (Comment) What gender do you identify as?: Female Marital status: Widowed [place] name: Tokareva Pregnancy Status: No Living Arrangements: Children(adult dtr [name]) Can pt return to current living arrangement?: (undecided at this time) Admission Status: Involuntary Petitioner: Family member Is patient capable of signing voluntary admission?: No Referral Source: Self/Family/Friend Insurance type: medicare     Crisis Care Plan Living Arrangements: Children(adult dtr [name]) Name of Psychiatrist: Dr. Adele Schilder Name of Therapist: none  Education Status Is patient currently in school?: No  Risk to self with the past 6 months Suicidal Ideation: No-Not Currently/Within Last 6 Months Has patient been a risk to self within the past 6 months prior to admission? : Yes Suicidal Intent: No-Not Currently/Within Last 6 Months Has patient had any suicidal intent within the past 6 months prior to admission? : Yes Is patient at risk for suicide?: Yes Suicidal Plan?: No-Not Currently/Within Last 6 Months Has patient had any suicidal plan within the past 6 months prior to admission? : Yes Access to Means: Yes Specify Access to Suicidal Means: pt leaves home & walks into street What has been your use of drugs/alcohol within the last 12 months?: none Previous Attempts/Gestures: Yes(6 yrs ago- 2x dtr sts pt passed out with empty pill bottles) How many times?:  2(walked into street greater than 20 times) Triggers for Past Attempts: Unknown Intentional Self Injurious Behavior: None Family Suicide History: Unknown Recent stressful life event(s): (Unknown) Persecutory voices/beliefs?: No Depression: Yes Depression Symptoms: Despondent, Insomnia, Isolating, Fatigue, Feeling angry/irritable Substance abuse history and/or treatment for substance abuse?: No Suicide prevention information given to non-admitted patients: Not applicable  Risk to Others within the past 6 months Homicidal Ideation: (UTA) Does patient have any lifetime risk of violence toward others beyond the six months prior to admission? : Yes (comment)(throws things at daughter, states I will kill you) Thoughts of Harm to Others: (UTA) Current Homicidal Intent: (UTA) Current Homicidal Plan: (UTA) Access to Homicidal Means: No(dtr keeps Administrator, Civil Service hidden; gun in safe) History of harm to others?: Yes Assessment of Violence: (hits out & throws things at dtr) Does patient have access to weapons?: No(kitchen knives hidden from pt; gun in safe) Criminal Charges Pending?: No Does patient have a court date: No Is patient on probation?: No  Psychosis Hallucinations: (UTA) Delusions: (UTA)  Mental Status Report Appearance/Hygiene: Disheveled, In scrubs Eye Contact: Poor Motor Activity: Freedom of movement Speech: Incoherent, Language other than English Level of Consciousness: Quiet/awake Mood: Depressed Affect: Flat, Blunted  Anxiety Level: Moderate Thought Processes: Unable to Assess Judgement: Impaired Orientation: Unable to assess Obsessive Compulsive Thoughts/Behaviors: None  Cognitive Functioning Concentration: Decreased Memory: Unable to Assess Is patient IDD: No Insight: Unable to Assess Impulse Control: Unable to Assess Appetite: Fair Have you had any weight changes? : Gain Amount of the weight change? (lbs): 10 lbs Sleep: Decreased Total Hours of Sleep: 6(about  4am-10am) Vegetative Symptoms: Decreased grooming  ADLScreening [facility] Assessment Services) Patient's cognitive ability adequate to safely complete daily activities?: Yes Patient able to express need for assistance with ADLs?: (UTA) Independently performs ADLs?: (UTA)  Prior Inpatient Therapy Prior Inpatient Therapy: No  Prior Outpatient Therapy Prior Outpatient Therapy: Yes Prior Therapy Dates: ongoing Prior Therapy Facilty/Provider(s): Dr. Adele Schilder Reason for Treatment: Depression Does patient have an ACCT team?: No Does patient have Intensive In-House Services?  : No Does patient have [facility] services? : No Does patient have P4CC services?: No  ADL Screening (condition at time of admission) Patient's cognitive ability adequate to safely complete daily activities?: Yes Patient able to express need for assistance with ADLs?: (UTA) Independently performs ADLs?: (UTA)                        Disposition: Leilani Merl, DO, recommends gero-psychiatric hospitalization Disposition Initial Assessment Completed for this Encounter: Yes Disposition of Patient: Admit(geropsych inpt)  This service was provided via telemedicine using a 2-way, interactive audio and video technology.  Names of all persons participating in this telemedicine service and their role in this encounter. Name: Ignacia Marvel, MSW, lcsw Role: TTS  Name: Jarica Plass Role: pt's dtr  Name: Kathreen Cosier Role: pt  Name: Leilani Merl, DO Role: ED provider    Rebbecca Osuna H Brigido Mera 12/02/2017 1:10 PM

## 2017-12-03 DIAGNOSIS — F0391 Unspecified dementia with behavioral disturbance: Secondary | ICD-10-CM

## 2017-12-03 DIAGNOSIS — F333 Major depressive disorder, recurrent, severe with psychotic symptoms: Secondary | ICD-10-CM | POA: Diagnosis not present

## 2017-12-03 DIAGNOSIS — F22 Delusional disorders: Secondary | ICD-10-CM | POA: Diagnosis not present

## 2017-12-03 DIAGNOSIS — Z008 Encounter for other general examination: Secondary | ICD-10-CM

## 2017-12-03 MED ORDER — OLANZAPINE 10 MG IM SOLR
5.0000 mg | Freq: Once | INTRAMUSCULAR | Status: AC
  Administered 2017-12-03: 5 mg via INTRAMUSCULAR
  Filled 2017-12-03: qty 10

## 2017-12-03 MED ORDER — DIPHENHYDRAMINE HCL 50 MG/ML IJ SOLN
25.0000 mg | Freq: Once | INTRAMUSCULAR | Status: AC
  Administered 2017-12-03: 25 mg via INTRAMUSCULAR
  Filled 2017-12-03: qty 1

## 2017-12-03 MED ORDER — TRAZODONE HCL 100 MG PO TABS
100.0000 mg | ORAL_TABLET | Freq: Every day | ORAL | Status: DC
  Administered 2017-12-04 – 2017-12-08 (×5): 100 mg via ORAL
  Filled 2017-12-03 (×6): qty 1

## 2017-12-03 MED ORDER — DIPHENHYDRAMINE HCL 50 MG/ML IJ SOLN
12.5000 mg | Freq: Once | INTRAMUSCULAR | Status: AC
  Administered 2017-12-03: 12.5 mg via INTRAMUSCULAR
  Filled 2017-12-03: qty 1

## 2017-12-03 NOTE — ED Notes (Signed)
Pt resting in assigned room at this time. No acute distress noted. Pt safety maintained at this time.

## 2017-12-03 NOTE — Progress Notes (Signed)
Pt. meets criteria for inpatient treatment per Leilani Merl, DO  Referred out to the following hospitals: [facility]  [facility]     Disposition CSW will continue to follow for placement.  Areatha Keas. Judi Cong, MSW, [place] Disposition Clinical Social Work [phone] (cell) [phone] (office)

## 2017-12-03 NOTE — ED Notes (Addendum)
Patient trying to get out of bed, trying to come out of room. MD called for sedation meds.

## 2017-12-03 NOTE — Consult Note (Addendum)
[facility] Face-to-Face Psychiatry Consult   Reason for Consult:  Agitation/psychosis  Referring Physician:  EDP Patient Identification: Alice Wong MRN:  [id number] Principal Diagnosis: Dementia with behavioral disturbance ([place]) Diagnosis:  Principal Problem:   Dementia with behavioral disturbance ([place])   Total Time spent with patient: 30 minutes  Subjective:   Alice Wong is a 83 y.o. female patient admitted with agitation/psychosis.  HPI:   Alice Wong was seen today. She was agitated agitated and attempted to leave her room. She was unable to be verbally redirected or guided back to bed. She was yelling and flailing her arms. She was placed back in bed by 3 staff members and given IM Zyprexa for agitation. Her sister was at bedside yesterday. Patient is [place] speaking only and has been unwilling to participate in interview with an interpreter. She occasionally speaks to her daughter when she is present. Her daughter, Alice Wong provided a detailed history. She reports that she has been restless and has left the house multiple times. She has been paranoid and believes that someone is in her house. She is poorly compliant with medications. Her daughter crushes them in applesauce. She has threatened to harm herself. She was diagnosed with dementia 8 years ago. She lives with her daughter. She has an aide that helps her Monday through Friday. She does not use alcohol or illicit drugs. She was doing well for a year after she was started on Zyprexa but it was changed to a different medication and she subsequently decompensated.   Past Psychiatric History: Dementia, unspecified psychosis and mood disorder.   Risk to Self: Suicidal Ideation: No-Not Currently/Within Last 6 Months Suicidal Intent: No-Not Currently/Within Last 6 Months Is patient at risk for suicide?: Yes Suicidal Plan?: No-Not Currently/Within Last 6 Months Access to Means: Yes Specify Access to Suicidal Means: pt leaves home &  walks into street What has been your use of drugs/alcohol within the last 12 months?: none How many times?: 2(walked into street greater than 20 times) Triggers for Past Attempts: Unknown Intentional Self Injurious Behavior: None Risk to Others: Homicidal Ideation: (UTA) Thoughts of Harm to Others: (UTA) Current Homicidal Intent: (UTA) Current Homicidal Plan: (UTA) Access to Homicidal Means: No(dtr keeps kitchen knives hidden; gun in safe) History of harm to others?: Yes Assessment of Violence: (hits out & throws things at dtr) Does patient have access to weapons?: No(kitchen knives hidden from pt; gun in safe) Criminal Charges Pending?: No Does patient have a court date: No Prior Inpatient Therapy: Prior Inpatient Therapy: No Prior Outpatient Therapy: Prior Outpatient Therapy: Yes Prior Therapy Dates: ongoing Prior Therapy Facilty/Provider(s): Dr. Adele Schilder Reason for Treatment: Depression Does patient have an ACCT team?: No Does patient have Intensive In-House Services?  : No Does patient have [facility] services? : No Does patient have P4CC services?: No  Past Medical History:  Past Medical History:  Diagnosis Date  . Anxiety   . Cholelithiasis   . CVA (cerebral vascular accident) ([place])   . Depression   . Diverticulosis   . GERD (gastroesophageal reflux disease)   . Gout 2009  . H. pylori infection 2011  . Hyperlipidemia   . Hypertension   . Memory loss   . MR (mitral regurgitation)   . Osteoporosis   . S/P cardiac cath 12/20/13 12/20/2013  . Thrombocytopenia ([place])   . Thyroid cyst   . Vaso-vagal reaction   . Vitamin D deficiency     Past Surgical History:  Procedure Laterality Date  . CESAREAN SECTION    .  CHOLECYSTECTOMY  2010  . LASIK    . LEFT HEART CATHETERIZATION WITH CORONARY ANGIOGRAM N/A 12/20/2013   Procedure: LEFT HEART CATHETERIZATION WITH CORONARY ANGIOGRAM;  Surgeon: Burnell Blanks, MD;  Location: [facility] CATH LAB;  Service: Cardiovascular;  Laterality:  N/A;   Family History:  Family History  Problem Relation Age of Onset  . Lung disease Father        Deceased  . Hypertension Other   . Stomach cancer Brother   . Ulcers Son   . Colon cancer Neg Hx    Family Psychiatric  History: None per chart review.  Social History:  Social History   Substance and Sexual Activity  Alcohol Use No  . Alcohol/week: 0.0 standard drinks     Social History   Substance and Sexual Activity  Drug Use No  . Types: Methylphenidate    Social History   Socioeconomic History  . Marital status: Widowed    Spouse name: Not on file  . Number of children: 3  . Years of education: Not on file  . Highest education level: Not on file  Occupational History  . Occupation: RETIRED  Social Needs  . Financial resource strain: Not on file  . Food insecurity:    Worry: Not on file    Inability: Not on file  . Transportation needs:    Medical: Not on file    Non-medical: Not on file  Tobacco Use  . Smoking status: Never Smoker  . Smokeless tobacco: Never Used  Substance and Sexual Activity  . Alcohol use: No    Alcohol/week: 0.0 standard drinks  . Drug use: No    Types: Methylphenidate  . Sexual activity: Never  Lifestyle  . Physical activity:    Days per week: Not on file    Minutes per session: Not on file  . Stress: Not on file  Relationships  . Social connections:    Talks on phone: Not on file    Gets together: Not on file    Attends religious service: Not on file    Active member of club or organization: Not on file    Attends meetings of clubs or organizations: Not on file    Relationship status: Not on file  Other Topics Concern  . Not on file  Social History Narrative   Family History Hypertension   No caffeine    Lives with husband in a 2 story home.  Retired.    Moved from [place] in 2002   Additional Social History: N/A    Allergies:   Allergies  Allergen Reactions  . Aricept [Donepezil Hcl]     ?dry tongue  . Coreg  [Carvedilol]     ?problems  . Morphine Sulfate Other (See Comments)    REACTION: decreased blood pressure    Labs:  Results for orders placed or performed during the hospital encounter of 12/01/17 (from the past 48 hour(s))  Comprehensive metabolic panel     Status: Abnormal   Collection Time: 12/01/17  4:01 PM  Result Value Ref Range   Sodium 142 135 - 145 mmol/L   Potassium 3.7 3.5 - 5.1 mmol/L   Chloride 105 98 - 111 mmol/L   CO2 28 22 - 32 mmol/L   Glucose, Bld 180 (H) 70 - 99 mg/dL   BUN 28 (H) 8 - 23 mg/dL   Creatinine, Ser 0.95 0.44 - 1.00 mg/dL   Calcium 9.2 8.9 - 10.3 mg/dL   Total Protein 7.0 6.5 - 8.1  g/dL   Albumin 4.3 3.5 - 5.0 g/dL   AST 19 15 - 41 U/L   ALT 17 0 - 44 U/L   Alkaline Phosphatase 69 38 - 126 U/L   Total Bilirubin 0.7 0.3 - 1.2 mg/dL   GFR calc non Af Amer 55 (L) >60 mL/min   GFR calc Af Amer >60 >60 mL/min    Comment: (NOTE) The eGFR has been calculated using the CKD EPI equation. This calculation has not been validated in all clinical situations. eGFR's persistently <60 mL/min signify possible Chronic Kidney Disease.    Anion gap 9 5 - 15    Comment: Performed at [facility], [place] [address], [place], [place] [address]  Ethanol     Status: None   Collection Time: 12/01/17  4:01 PM  Result Value Ref Range   Alcohol, Ethyl (B) <10 <10 mg/dL    Comment: (NOTE) Lowest detectable limit for serum alcohol is 10 mg/dL. For medical purposes only. Performed at [facility], [place] [address] [place], [place] [address]   Salicylate level     Status: None   Collection Time: 12/01/17  4:01 PM  Result Value Ref Range   Salicylate Lvl <9.3 2.8 - 30.0 mg/dL    Comment: Performed at [facility], [place] [address] [place], [place] [address]  Acetaminophen level     Status: Abnormal   Collection Time: 12/01/17  4:01 PM  Result Value Ref Range   Acetaminophen (Tylenol), Serum <10 (L) 10 - 30 ug/mL     Comment: (NOTE) Therapeutic concentrations vary significantly. A range of 10-30 ug/mL  may be an effective concentration for many patients. However, some  are best treated at concentrations outside of this range. Acetaminophen concentrations >150 ug/mL at 4 hours after ingestion  and >50 ug/mL at 12 hours after ingestion are often associated with  toxic reactions. Performed at [facility], [place] [address] [place], [place] [address]   cbc     Status: None   Collection Time: 12/01/17  4:01 PM  Result Value Ref Range   WBC 6.8 4.0 - 10.5 K/uL   RBC 4.24 3.87 - 5.11 MIL/uL   Hemoglobin 13.3 12.0 - 15.0 g/dL   HCT 39.8 36.0 - 46.0 %   MCV 93.9 80.0 - 100.0 fL   MCH 31.4 26.0 - 34.0 pg   MCHC 33.4 30.0 - 36.0 g/dL   RDW 12.2 11.5 - 15.5 %   Platelets 252 150 - 400 K/uL   nRBC 0.0 0.0 - 0.2 %    Comment: Performed at [facility], [place] [address], [place],  [address]  Rapid urine drug screen (hospital performed)     Status: Abnormal   Collection Time: 12/01/17  4:51 PM  Result Value Ref Range   Opiates NONE DETECTED NONE DETECTED   Cocaine NONE DETECTED NONE DETECTED   Benzodiazepines POSITIVE (A) NONE DETECTED   Amphetamines NONE DETECTED NONE DETECTED   Tetrahydrocannabinol NONE DETECTED NONE DETECTED   Barbiturates NONE DETECTED NONE DETECTED    Comment: (NOTE) DRUG SCREEN FOR MEDICAL PURPOSES ONLY.  IF CONFIRMATION IS NEEDED FOR ANY PURPOSE, NOTIFY LAB WITHIN 5 DAYS. LOWEST DETECTABLE LIMITS FOR URINE DRUG SCREEN Drug Class                     Cutoff (ng/mL) Amphetamine and metabolites    1000 Barbiturate and metabolites    200 Benzodiazepine  462 Tricyclics and metabolites     300 Opiates and metabolites        300 Cocaine and metabolites        300 THC                            50 Performed at [facility], [place] [address] [place], [place] [address]   Urinalysis, Routine w reflex  microscopic     Status: Abnormal   Collection Time: 12/01/17  4:51 PM  Result Value Ref Range   Color, Urine YELLOW YELLOW   APPearance CLEAR CLEAR   Specific Gravity, Urine 1.015 1.005 - 1.030   pH 7.0 5.0 - 8.0   Glucose, UA NEGATIVE NEGATIVE mg/dL   Hgb urine dipstick NEGATIVE NEGATIVE   Bilirubin Urine NEGATIVE NEGATIVE   Ketones, ur 5 (A) NEGATIVE mg/dL   Protein, ur NEGATIVE NEGATIVE mg/dL   Nitrite NEGATIVE NEGATIVE   Leukocytes, UA TRACE (A) NEGATIVE   RBC / HPF 0-5 0 - 5 RBC/hpf   WBC, UA 0-5 0 - 5 WBC/hpf   Bacteria, UA NONE SEEN NONE SEEN    Comment: Performed at [facility], [place] [address] [place], [place] [address]    Current Facility-Administered Medications  Medication Dose Route Frequency Provider Last Rate Last Dose  . amLODipine (NORVASC) tablet 5 mg  5 mg Oral Daily Dorie Rank, MD   5 mg at 12/03/17 0958  . aspirin EC tablet 81 mg  81 mg Oral Daily Dorie Rank, MD   81 mg at 12/03/17 0959  . cholecalciferol (VITAMIN D) tablet 2,000 Units  2,000 Units Oral Daily Dorie Rank, MD   2,000 Units at 12/03/17 [phone]  . DULoxetine (CYMBALTA) DR capsule 60 mg  60 mg Oral Daily Dorie Rank, MD   60 mg at 12/03/17 0958  . memantine (NAMENDA) tablet 10 mg  10 mg Oral BID Dorie Rank, MD   10 mg at 12/03/17 0959  . metFORMIN (GLUCOPHAGE) tablet 500 mg  500 mg Oral BID WC Dorie Rank, MD   500 mg at 12/03/17 0850  . OLANZapine (ZYPREXA) tablet 10 mg  10 mg Oral QHS Dorie Rank, MD      . ziprasidone (GEODON) injection 10 mg  10 mg Intramuscular Once Pollina, Gwenyth Allegra, MD       Current Outpatient Medications  Medication Sig Dispense Refill  . amLODipine (NORVASC) 5 MG tablet TAKE 1 TABLET BY MOUTH EVERY DAY 90 tablet 3  . aspirin EC 81 MG EC tablet Take 1 tablet (81 mg total) by mouth daily.    . Cholecalciferol (D3-50) 1.25 MG (50000 UT) capsule Take 1 capsule (50,000 Units total) by mouth every 30 (thirty) days. 3 capsule 3  . DULoxetine (CYMBALTA) 60 MG  capsule Take 1 capsule (60 mg total) by mouth daily. 90 capsule 0  . memantine (NAMENDA) 10 MG tablet TAKE 1 TABLET BY MOUTH TWICE A DAY 180 tablet 2  . metFORMIN (GLUCOPHAGE) 500 MG tablet TAKE 1 TABLET BY MOUTH TWICE A DAY WITH MEALS 180 tablet 1  . OLANZapine (ZYPREXA) 10 MG tablet TAKE 1 TABLET BY MOUTH EVERYDAY AT BEDTIME 90 tablet 0  . diphenoxylate-atropine (LOMOTIL) 2.5-0.025 MG tablet Take 1 tablet by mouth 4 (four) times daily as needed for diarrhea or loose stools. (Patient not taking: Reported on 12/01/2017) 60 tablet 1  . nitroGLYCERIN (NITROSTAT) 0.4 MG SL tablet DISSOLVE ONE TABLET UNDER THE TONGUE  EVERY 5 MINUTES AS NEEDED FOR CHEST PAIN (Patient taking differently: Place 0.4 mg under the tongue every 5 (five) minutes as needed for chest pain. ) 25 tablet 0    Musculoskeletal: Strength & Muscle Tone: within normal limits Gait & Station: unsteady Patient leans: N/A  Psychiatric Specialty Exam: Physical Exam  Nursing note and vitals reviewed. Constitutional: She appears well-developed and well-nourished.  HENT:  Head: Normocephalic and atraumatic.  Neck: Normal range of motion.  Respiratory: Effort normal.  Musculoskeletal: Normal range of motion.  Neurological: She is alert.  Oriented to person only.   Psychiatric: Cognition and memory are impaired. She expresses inappropriate judgment.    Review of Systems  Unable to perform ROS: Mental status change    Blood pressure (!) (P) 160/116, pulse (!) (P) 116, temperature (P) 98.3 F (36.8 C), temperature source (P) Oral, resp. rate (P) 16, height '5\' 3"'$  (1.6 m), weight 77.1 kg, SpO2 (P) 96 %.Body mass index is 30.11 kg/m.  General Appearance: Fairly Groomed, elderly, [place] female, wearing paper hospital scrubs and standing in her room.   Eye Contact:  Fair  Speech:  UTA since patient will not speak to treatment team.  Volume:  Normal  Mood:  Irritable  Affect:  Labile  Thought Process:  Disorganized  Orientation:   Other:  Oriented to self only.  Thought Content:  UTA since patient will not speak to treatment team.  Suicidal Thoughts:  UTA since patient will not speak to treatment team.  Homicidal Thoughts:  UTA since patient will not speak to treatment team.  Memory:  UTA since patient will not speak to treatment team.  Judgement:  Impaired  Insight:  Lacking  Psychomotor Activity:  Decreased  Concentration:  Concentration: UTA since patient will not speak to treatment team. and Attention Span: UTA since patient will not speak to treatment team.  Recall:  UTA since patient will not speak to treatment team.  Fund of Knowledge:  UTA since patient will not speak to treatment team.  Language:  UTA since patient will not speak to treatment team.  Akathisia:  NA  Handed:  Right  AIMS (if indicated):   N/A  Assets:  Housing Social Support  ADL's:  Impaired  Cognition:  Impaired due to psychiatric condition.   Sleep:   N/A   Assessment:  BRODY KUMP is a 83 y.o. female who was admitted with agitation/psychosis. She has been physically aggressive, paranoid and threatening to harm self. She warrants inpatient psychiatric hospitalization for stabilization and treatment.   Treatment Plan Summary: Daily contact with patient to assess and evaluate symptoms and progress in treatment and Medication management  -Continue Cymbalta 60 mg daily for depression. -Continue Memantine 10 mg BID for dementia.  -Continue Zyprexa 10 mg qhs for psychosis.   Disposition: Recommend psychiatric Inpatient admission when medically cleared.  Faythe Dingwall, DO 12/03/2017 12:39 PM

## 2017-12-03 NOTE — ED Notes (Signed)
Patient at door, hitting and yelling. Tech at bedside helping patient get back to bed. Patient still yelling, screaming, and kicking.

## 2017-12-04 ENCOUNTER — Emergency Department (HOSPITAL_COMMUNITY): Payer: Medicare Other

## 2017-12-04 ENCOUNTER — Other Ambulatory Visit: Payer: Self-pay

## 2017-12-04 DIAGNOSIS — Z046 Encounter for general psychiatric examination, requested by authority: Secondary | ICD-10-CM | POA: Diagnosis not present

## 2017-12-04 DIAGNOSIS — F333 Major depressive disorder, recurrent, severe with psychotic symptoms: Secondary | ICD-10-CM | POA: Diagnosis not present

## 2017-12-04 DIAGNOSIS — Z008 Encounter for other general examination: Secondary | ICD-10-CM | POA: Diagnosis not present

## 2017-12-04 DIAGNOSIS — F22 Delusional disorders: Secondary | ICD-10-CM | POA: Diagnosis not present

## 2017-12-04 DIAGNOSIS — F0391 Unspecified dementia with behavioral disturbance: Secondary | ICD-10-CM | POA: Diagnosis not present

## 2017-12-04 LAB — CBG MONITORING, ED: Glucose-Capillary: 148 mg/dL — ABNORMAL HIGH (ref 70–99)

## 2017-12-04 MED ORDER — DIPHENHYDRAMINE HCL 50 MG/ML IJ SOLN
25.0000 mg | Freq: Once | INTRAMUSCULAR | Status: AC
  Administered 2017-12-04: 25 mg via INTRAMUSCULAR
  Filled 2017-12-04: qty 1

## 2017-12-04 MED ORDER — HALOPERIDOL LACTATE 5 MG/ML IJ SOLN
5.0000 mg | Freq: Once | INTRAMUSCULAR | Status: AC
  Administered 2017-12-04: 5 mg via INTRAMUSCULAR
  Filled 2017-12-04: qty 1

## 2017-12-04 MED ORDER — OLANZAPINE 2.5 MG PO TABS
2.5000 mg | ORAL_TABLET | Freq: Every morning | ORAL | Status: DC
  Administered 2017-12-05: 2.5 mg via ORAL
  Filled 2017-12-04: qty 1

## 2017-12-04 MED ORDER — OLANZAPINE 10 MG IM SOLR
5.0000 mg | Freq: Once | INTRAMUSCULAR | Status: AC
  Administered 2017-12-04: 5 mg via INTRAMUSCULAR
  Filled 2017-12-04: qty 10

## 2017-12-04 NOTE — ED Notes (Addendum)
Patient walking out in hallway. Aggressive and hitting at staff when trying to direct back to bed. Security called.

## 2017-12-04 NOTE — ED Notes (Signed)
Daughter is at bedside with pt.

## 2017-12-04 NOTE — ED Notes (Signed)
Pt is sitting in the chair resting.  Appears comfortable and in NAD.  Sitter remains at bedside

## 2017-12-04 NOTE — ED Notes (Signed)
Pt continues to pace in the hallway with sitter.  Refuses to go in her room.  Sits at the desk with sitter

## 2017-12-04 NOTE — ED Notes (Signed)
Attempted to give pt shower. We got half way undressed and she put her shirt back on and walked out of bathroom and would not go back in and would not go back in her room. I called her daughter to try and translate, daughter stated she cant understand what all she is saying either. Pt seemed calmer after talking to daughter.

## 2017-12-04 NOTE — BH Assessment (Signed)
[facility] Assessment Progress Note  Per Buford Dresser, DO, this pt requires psychiatric hospitalization at this time.  Pt presents under IVC initiated by pt's daughter, and upheld by EDP Dorie Rank, MD.  The following facilities have been contacted to seek placement for this pt, with results as noted:  Beds available, information sent, decision pending:  [facility]   Declined:  [facility] (due to pt acuity)   At capacity:  [facility], [place] Coordinator [phone]
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
## 2017-12-04 NOTE — ED Notes (Signed)
Pt continues to pace the hallways, would not go in her room.  Sitter with pt

## 2017-12-04 NOTE — ED Notes (Signed)
Pt continues to pace the hallways with sitter.  Meds administered as ordered.

## 2017-12-04 NOTE — ED Notes (Signed)
Pt is sitting in the chair eating dinner with NT assist
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
## 2017-12-05 DIAGNOSIS — F22 Delusional disorders: Secondary | ICD-10-CM | POA: Diagnosis not present

## 2017-12-05 DIAGNOSIS — F0281 Dementia in other diseases classified elsewhere with behavioral disturbance: Secondary | ICD-10-CM

## 2017-12-05 DIAGNOSIS — F333 Major depressive disorder, recurrent, severe with psychotic symptoms: Secondary | ICD-10-CM | POA: Diagnosis not present

## 2017-12-05 DIAGNOSIS — G309 Alzheimer's disease, unspecified: Secondary | ICD-10-CM

## 2017-12-05 NOTE — ED Notes (Signed)
Patient restless, agitated and keeps attempting to leave the room. Patient redirectable and we have returned patient to room. Breakfast tray provided however patient is not eating. Sitter remains at bedside.

## 2017-12-05 NOTE — ED Notes (Signed)
Pt is sitting in the chair at the desk with staff.  Continues to refuse to go to her room.

## 2017-12-05 NOTE — Consult Note (Addendum)
[facility] Face-to-Face Psychiatry Consult   Reason for Consult:  Agitation/psychosis  Referring Physician:  EDP Patient Identification: Alice Wong MRN:  [id number] Principal Diagnosis: Dementia with behavioral disturbance ([place]) Diagnosis:  Principal Problem:   Dementia with behavioral disturbance ([place])   Total Time spent with patient: 30 minutes  Subjective:   Alice Wong is a 83 y.o. female patient admitted with agitation/psychosis.  HPI:   Ms. Zarr was seen today. She was in the hall in the TCU, agitated, aggressive and attempting to leave. She was unable to be verbally redirected or guided back to bed. She was yelling and flailing her arms. She was placed back in bed by 3 staff members and given IM Zyprexa for agitation.   Patient is [place] speaking only and has been unwilling to participate in interview with an interpreter. Her daughter, [name] provided a detailed history. She reports that she has been restless and has left the house multiple times. She has been paranoid and believes that someone is in her house. She is poorly compliant with medications. Her daughter crushes them in applesauce. Her daughter visited her today and she sated her mother is still not making sense, even to her. She has threatened to harm herself and frequently requires redirection to return to her room.  She appears to prefer sitting in a chair by the nurse's station but then will get up and try to wander away.  She was diagnosed with dementia 8 years ago. She lives with her daughter. She has an aide that helps her Monday through Friday. She does not use alcohol or illicit drugs. She was doing well for a year after she was started on Zyprexa but it was changed to a different medication and she subsequently decompensated. She is currently back on Zyprexa 10 mg qhs and today Zyprexa 2.5 mg was added for mornings as she seems to decompensate in the morning hours. Pt is in need of a gero psychiatric inpatient  admission for crisis stabilization and medication management.  Past Psychiatric History: Dementia, unspecified psychosis and mood disorder.   Risk to Self: Suicidal Ideation: No-Not Currently/Within Last 6 Months Suicidal Intent: No-Not Currently/Within Last 6 Months Is patient at risk for suicide?: Yes Suicidal Plan?: No-Not Currently/Within Last 6 Months Access to Means: Yes Specify Access to Suicidal Means: pt leaves home & walks into street What has been your use of drugs/alcohol within the last 12 months?: none How many times?: 2(walked into street greater than 20 times) Triggers for Past Attempts: Unknown Intentional Self Injurious Behavior: None Risk to Others: Homicidal Ideation: (UTA) Thoughts of Harm to Others: (UTA) Current Homicidal Intent: (UTA) Current Homicidal Plan: (UTA) Access to Homicidal Means: No(dtr keeps kitchen knives hidden; gun in safe) History of harm to others?: Yes Assessment of Violence: (hits out & throws things at dtr) Does patient have access to weapons?: No(kitchen knives hidden from pt; gun in safe) Criminal Charges Pending?: No Does patient have a court date: No Prior Inpatient Therapy: Prior Inpatient Therapy: No Prior Outpatient Therapy: Prior Outpatient Therapy: Yes Prior Therapy Dates: ongoing Prior Therapy Facilty/Provider(s): Dr. Adele Schilder Reason for Treatment: Depression Does patient have an ACCT team?: No Does patient have Intensive In-House Services?  : No Does patient have [facility] services? : No Does patient have P4CC services?: No  Past Medical History:  Past Medical History:  Diagnosis Date  . Anxiety   . Cholelithiasis   . CVA (cerebral vascular accident) ([place])   . Depression   . Diverticulosis   .  GERD (gastroesophageal reflux disease)   . Gout 2009  . H. pylori infection 2011  . Hyperlipidemia   . Hypertension   . Memory loss   . MR (mitral regurgitation)   . Osteoporosis   . S/P cardiac cath 12/20/13 12/20/2013  .  Thrombocytopenia ([place])   . Thyroid cyst   . Vaso-vagal reaction   . Vitamin D deficiency     Past Surgical History:  Procedure Laterality Date  . CESAREAN SECTION    . CHOLECYSTECTOMY  2010  . LASIK    . LEFT HEART CATHETERIZATION WITH CORONARY ANGIOGRAM N/A 12/20/2013   Procedure: LEFT HEART CATHETERIZATION WITH CORONARY ANGIOGRAM;  Surgeon: Burnell Blanks, MD;  Location: [facility] CATH LAB;  Service: Cardiovascular;  Laterality: N/A;   Family History:  Family History  Problem Relation Age of Onset  . Lung disease Father        Deceased  . Hypertension Other   . Stomach cancer Brother   . Ulcers Son   . Colon cancer Neg Hx    Family Psychiatric  History: None per chart review.  Social History:  Social History   Substance and Sexual Activity  Alcohol Use No  . Alcohol/week: 0.0 standard drinks     Social History   Substance and Sexual Activity  Drug Use No  . Types: Methylphenidate    Social History   Socioeconomic History  . Marital status: Widowed    Spouse name: Not on file  . Number of children: 3  . Years of education: Not on file  . Highest education level: Not on file  Occupational History  . Occupation: RETIRED  Social Needs  . Financial resource strain: Not on file  . Food insecurity:    Worry: Not on file    Inability: Not on file  . Transportation needs:    Medical: Not on file    Non-medical: Not on file  Tobacco Use  . Smoking status: Never Smoker  . Smokeless tobacco: Never Used  Substance and Sexual Activity  . Alcohol use: No    Alcohol/week: 0.0 standard drinks  . Drug use: No    Types: Methylphenidate  . Sexual activity: Never  Lifestyle  . Physical activity:    Days per week: Not on file    Minutes per session: Not on file  . Stress: Not on file  Relationships  . Social connections:    Talks on phone: Not on file    Gets together: Not on file    Attends religious service: Not on file    Active member of club or organization:  Not on file    Attends meetings of clubs or organizations: Not on file    Relationship status: Not on file  Other Topics Concern  . Not on file  Social History Narrative   Family History Hypertension   No caffeine    Lives with husband in a 2 story home.  Retired.    Moved from [place] in 2002   Additional Social History: N/A    Allergies:   Allergies  Allergen Reactions  . Aricept [Donepezil Hcl]     ?dry tongue  . Coreg [Carvedilol]     ?problems  . Morphine Sulfate Other (See Comments)    REACTION: decreased blood pressure    Labs:  Results for orders placed or performed during the hospital encounter of 12/01/17 (from the past 48 hour(s))  CBG monitoring, ED     Status: Abnormal   Collection Time: 12/04/17  9:10 PM  Result Value Ref Range   Glucose-Capillary 148 (H) 70 - 99 mg/dL    Current Facility-Administered Medications  Medication Dose Route Frequency Provider Last Rate Last Dose  . amLODipine (NORVASC) tablet 5 mg  5 mg Oral Daily Dorie Rank, MD   5 mg at 12/05/17 1015  . aspirin EC tablet 81 mg  81 mg Oral Daily Dorie Rank, MD   81 mg at 12/05/17 1016  . cholecalciferol (VITAMIN D) tablet 2,000 Units  2,000 Units Oral Daily Dorie Rank, MD   2,000 Units at 12/05/17 1016  . DULoxetine (CYMBALTA) DR capsule 60 mg  60 mg Oral Daily Dorie Rank, MD   60 mg at 12/05/17 1015  . memantine (NAMENDA) tablet 10 mg  10 mg Oral BID Dorie Rank, MD   10 mg at 12/05/17 1016  . metFORMIN (GLUCOPHAGE) tablet 500 mg  500 mg Oral BID WC Dorie Rank, MD   500 mg at 12/05/17 0935  . OLANZapine (ZYPREXA) tablet 10 mg  10 mg Oral QHS Dorie Rank, MD   10 mg at 12/04/17 2118  . OLANZapine (ZYPREXA) tablet 2.5 mg  2.5 mg Oral q morning - 10a Ethelene Hal, NP   2.5 mg at 12/05/17 1016  . traZODone (DESYREL) tablet 100 mg  100 mg Oral QHS Valarie Merino, MD   100 mg at 12/04/17 2118   Current Outpatient Medications  Medication Sig Dispense Refill  . amLODipine (NORVASC) 5 MG  tablet TAKE 1 TABLET BY MOUTH EVERY DAY 90 tablet 3  . aspirin EC 81 MG EC tablet Take 1 tablet (81 mg total) by mouth daily.    . Cholecalciferol (D3-50) 1.25 MG (50000 UT) capsule Take 1 capsule (50,000 Units total) by mouth every 30 (thirty) days. 3 capsule 3  . DULoxetine (CYMBALTA) 60 MG capsule Take 1 capsule (60 mg total) by mouth daily. 90 capsule 0  . memantine (NAMENDA) 10 MG tablet TAKE 1 TABLET BY MOUTH TWICE A DAY 180 tablet 2  . metFORMIN (GLUCOPHAGE) 500 MG tablet TAKE 1 TABLET BY MOUTH TWICE A DAY WITH MEALS 180 tablet 1  . OLANZapine (ZYPREXA) 10 MG tablet TAKE 1 TABLET BY MOUTH EVERYDAY AT BEDTIME 90 tablet 0  . diphenoxylate-atropine (LOMOTIL) 2.5-0.025 MG tablet Take 1 tablet by mouth 4 (four) times daily as needed for diarrhea or loose stools. (Patient not taking: Reported on 12/01/2017) 60 tablet 1  . nitroGLYCERIN (NITROSTAT) 0.4 MG SL tablet DISSOLVE ONE TABLET UNDER THE TONGUE EVERY 5 MINUTES AS NEEDED FOR CHEST PAIN (Patient taking differently: Place 0.4 mg under the tongue every 5 (five) minutes as needed for chest pain. ) 25 tablet 0    Musculoskeletal: Strength & Muscle Tone: within normal limits Gait & Station: unsteady Patient leans: N/A  Psychiatric Specialty Exam: Physical Exam  Nursing note and vitals reviewed. Constitutional: She appears well-developed and well-nourished.  HENT:  Head: Normocephalic and atraumatic.  Neck: Normal range of motion.  Respiratory: Effort normal.  Musculoskeletal: Normal range of motion.  Neurological: She is alert.  Oriented to person only.   Psychiatric: Her affect is labile. Her speech is tangential. She is agitated. Thought content is paranoid and delusional. Cognition and memory are impaired. She expresses inappropriate judgment.    Review of Systems  Unable to perform ROS: Mental status change    Blood pressure (!) 106/44, pulse 72, temperature 98.5 F (36.9 C), temperature source Oral, resp. rate 16, height 5\' 3"   (1.6 m), weight 77.1  kg, SpO2 94 %.Body mass index is 30.11 kg/m.  General Appearance: Fairly Groomed, elderly, [place] female, wearing paper hospital scrubs and standing in her room.   Eye Contact:  Fair  Speech:  UTA since patient will not speak to treatment team.  Volume:  Normal  Mood:  Irritable  Affect:  Labile  Thought Process:  Disorganized  Orientation:  Other:  Oriented to self only.  Thought Content:  UTA since patient will not speak to treatment team.  Suicidal Thoughts:  UTA since patient will not speak to treatment team.  Homicidal Thoughts:  UTA since patient will not speak to treatment team.  Memory:  UTA since patient will not speak to treatment team.  Judgement:  Impaired  Insight:  Lacking  Psychomotor Activity:  Decreased  Concentration:  Concentration: UTA since patient will not speak to treatment team. and Attention Span: UTA since patient will not speak to treatment team.  Recall:  UTA since patient will not speak to treatment team.  Fund of Knowledge:  UTA since patient will not speak to treatment team.  Language:  UTA since patient will not speak to treatment team.  Akathisia:  NA  Handed:  Right  AIMS (if indicated):   N/A  Assets:  Housing Social Support  ADL's:  Impaired  Cognition:  Impaired due to psychiatric condition.   Sleep:   N/A   Assessment:  Alice Wong is a 83 y.o. female who was admitted with agitation/psychosis. She continues to be physically aggressive, paranoid and threatening to harm self. She continues to warrant inpatient psychiatric hospitalization for stabilization and treatment.   Treatment Plan Summary: Daily contact with patient to assess and evaluate symptoms and progress in treatment and Medication management  -Continue Cymbalta 60 mg daily for depression. -Continue Memantine 10 mg BID for dementia.  -Continue Zyprexa 10 mg qhs for psychosis.  -Continue Zyprexa 2.5 mg QAM for mood stabilization.    Disposition: Recommend  psychiatric Inpatient admission when medically cleared. TTS to seek placement in a gero psychiatric inpatient facility.   Ethelene Hal, NP 12/05/2017 12:29 PM    Patient seen face-to-face for psychiatric evaluation, chart reviewed and case discussed with the physician extender and developed treatment plan. Reviewed the information documented and agree with the treatment plan.  Buford Dresser, DO 12/05/17 4:52 PM

## 2017-12-05 NOTE — ED Notes (Signed)
Patient up, attempting to bite and pinch staff. Patient not directable at this time. Security as well as [employer] are at bedside. Patient crying and trying to leave out of room.

## 2017-12-05 NOTE — BH Assessment (Signed)
[facility] Assessment Progress Note  Per Buford Dresser, DO, this pt continues to require psychiatric hospitalization at this time.  The following facilities have been contacted to seek placement for this pt, with results as noted:  Beds available, information sent on 12/04/2017, decision pending:  [facility]   Declined:  [facility] (due to pt acuity)   At capacity:  [facility], [place] Coordinator [phone]

## 2017-12-05 NOTE — ED Notes (Signed)
Pt is laying in bed, sitter at bedside.

## 2017-12-06 DIAGNOSIS — F333 Major depressive disorder, recurrent, severe with psychotic symptoms: Secondary | ICD-10-CM | POA: Diagnosis not present

## 2017-12-06 DIAGNOSIS — F22 Delusional disorders: Secondary | ICD-10-CM | POA: Diagnosis not present

## 2017-12-06 DIAGNOSIS — G309 Alzheimer's disease, unspecified: Secondary | ICD-10-CM | POA: Diagnosis not present

## 2017-12-06 DIAGNOSIS — F0281 Dementia in other diseases classified elsewhere with behavioral disturbance: Secondary | ICD-10-CM | POA: Diagnosis not present

## 2017-12-06 MED ORDER — HALOPERIDOL LACTATE 5 MG/ML IJ SOLN
5.0000 mg | Freq: Once | INTRAMUSCULAR | Status: AC
  Administered 2017-12-06: 5 mg via INTRAMUSCULAR
  Filled 2017-12-06: qty 1

## 2017-12-06 MED ORDER — OLANZAPINE 5 MG PO TABS: 5.0000 mg | ORAL_TABLET | Freq: Every morning | ORAL | Status: DC

## 2017-12-06 MED ORDER — LORAZEPAM 2 MG/ML IJ SOLN
1.0000 mg | Freq: Once | INTRAMUSCULAR | Status: AC | PRN
  Administered 2017-12-06: 1 mg via INTRAMUSCULAR
  Filled 2017-12-06: qty 1

## 2017-12-06 MED ORDER — OLANZAPINE 2.5 MG PO TABS
2.5000 mg | ORAL_TABLET | Freq: Every morning | ORAL | Status: DC
  Administered 2017-12-06 – 2017-12-07 (×2): 2.5 mg via ORAL
  Filled 2017-12-06 (×2): qty 1

## 2017-12-06 NOTE — ED Notes (Signed)
Patient suddenly did not want to go back to her room and when sitter and nurse attempted to direct her to stay in room she became extremely agitated and was fighting to get out.  Security stepped in and assisted with getting patient back to her bed.  Nurse practitioner notified.  Haldol 5mg  ordered and was given IM.  Patient had to be placed in restraints until medication takes effect for her own safety and the safety of others.

## 2017-12-06 NOTE — ED Notes (Signed)
Patient needed to be fed by sitter.  When her breakfast was presented, she put her fork in her orange juice.  Patient did not seem to be mentally able to feed herself.  Patient continues to be very confused acting.  At the moment she is sitting calmly on the side of her bed.

## 2017-12-06 NOTE — ED Notes (Signed)
Patient feeding herself some at lunch time.  Calm and cooperative.

## 2017-12-06 NOTE — ED Notes (Signed)
Patient in shower with sitter assisting.

## 2017-12-06 NOTE — ED Notes (Signed)
Patient continues to struggle to get out of bed.  Security at bedside.  Sitter taking patient for a walk around the unit.  Patient sitting up in chair eating crackers and drinking juice now.

## 2017-12-06 NOTE — ED Notes (Signed)
Patient remains asleep.  Will continue to monitor.  Medication held due to patient sleeping.

## 2017-12-06 NOTE — ED Notes (Signed)
Patient up walking around unit with sitter.  Offered coloring and a magazine as possible activities.

## 2017-12-06 NOTE — ED Notes (Signed)
Restraints released.  Patient lying in bed calmly alert but confused.

## 2017-12-06 NOTE — Progress Notes (Signed)
CSW followed up on the referrals with the following results.  Decision pending:  [name] (still reviewing) [facility] (still reviewing) [facility] (still reviewing)   Declined: [facility] (due to pt acuity)   At capacity: [facility]. Guerry Bruin, MSW, [place] Work/Disposition Phone: [phone] Fax: [phone]

## 2017-12-06 NOTE — Progress Notes (Addendum)
Patient ID: ALEKSIA FREIMAN, female   DOB: [birth year], 83 y.o.   MRN: [id number]  Pt placed in 4 point restraints due to her inability to be redirected back to her room. Pt is agitated. NAD noted. Pt is currently out of restraints and resting in her bed.   Ethelene Hal, NP-C 12-06-2017        1451   Patient's chart reviewed and case discussed with the physician extender and developed treatment plan. Reviewed the information documented and agree with the treatment plan.  Buford Dresser, DO 12/07/17 12:51 PM

## 2017-12-06 NOTE — Consult Note (Addendum)
[facility] Face-to-Face Psychiatry Consult   Reason for Consult:  Agitation/psychosis  Referring Physician:  EDP Patient Identification: Alice Wong MRN:  [id number] Principal Diagnosis: Dementia with behavioral disturbance ([place]) Diagnosis:  Principal Problem:   Dementia with behavioral disturbance (Alice Wong)   Total Time spent with patient: 30 minutes  Subjective:   Alice Wong is a 83 y.o. female patient admitted with agitation/psychosis.  HPI:   Pt was seen and chart reviewed with treatment team and Dr Mariea Clonts. Patient is [place] speaking only.  Today during evaluation: patient was standing in the doorway of her room but still is unable to participate in the assessment due to the language barrier and she will not speak to the interpretor. She has been living with her daughter but her behavior has become unmanageable and she is trying to escape from the house. She is also noncompliant with her medications. She has been started on Zyprexa 2.5 mg in the AM and 10 mg at bedtime for mood stabilization in the ED and has settled down to some extent but is still highly agitated at times and difficult to redirect. Pt continues to meet criteria for an inpatient geriatric psychiatric admission for crisis stabilization.   Past Psychiatric History: Dementia, unspecified psychosis and mood disorder.   Risk to Self: Suicidal Ideation: No-Not Currently/Within Last 6 Months Suicidal Intent: No-Not Currently/Within Last 6 Months Is patient at risk for suicide?: Yes Suicidal Plan?: No-Not Currently/Within Last 6 Months Access to Means: Yes Specify Access to Suicidal Means: pt leaves home & walks into street What has been your use of drugs/alcohol within the last 12 months?: none How many times?: 2(walked into street greater than 20 times) Triggers for Past Attempts: Unknown Intentional Self Injurious Behavior: None Risk to Others: Homicidal Ideation: (UTA) Thoughts of Harm to Others: (UTA) Current  Homicidal Intent: (UTA) Current Homicidal Plan: (UTA) Access to Homicidal Means: No(dtr keeps kitchen knives hidden; gun in safe) History of harm to others?: Yes Assessment of Violence: (hits out & throws things at dtr) Does patient have access to weapons?: No(kitchen knives hidden from pt; gun in safe) Criminal Charges Pending?: No Does patient have a court date: No Prior Inpatient Therapy: Prior Inpatient Therapy: No Prior Outpatient Therapy: Prior Outpatient Therapy: Yes Prior Therapy Dates: ongoing Prior Therapy Facilty/Provider(s): Dr. Adele Schilder Reason for Treatment: Depression Does patient have an ACCT team?: No Does patient have Intensive In-House Services?  : No Does patient have [facility] services? : No Does patient have P4CC services?: No  Past Medical History:  Past Medical History:  Diagnosis Date  . Anxiety   . Cholelithiasis   . CVA (cerebral vascular accident) ([place])   . Depression   . Diverticulosis   . GERD (gastroesophageal reflux disease)   . Gout 2009  . H. pylori infection 2011  . Hyperlipidemia   . Hypertension   . Memory loss   . MR (mitral regurgitation)   . Osteoporosis   . S/P cardiac cath 12/20/13 12/20/2013  . Thrombocytopenia ([place])   . Thyroid cyst   . Vaso-vagal reaction   . Vitamin D deficiency     Past Surgical History:  Procedure Laterality Date  . CESAREAN SECTION    . CHOLECYSTECTOMY  2010  . LASIK    . LEFT HEART CATHETERIZATION WITH CORONARY ANGIOGRAM N/A 12/20/2013   Procedure: LEFT HEART CATHETERIZATION WITH CORONARY ANGIOGRAM;  Surgeon: Burnell Blanks, MD;  Location: [facility] CATH LAB;  Service: Cardiovascular;  Laterality: N/A;   Family History:  Family  History  Problem Relation Age of Onset  . Lung disease Father        Deceased  . Hypertension Other   . Stomach cancer Brother   . Ulcers Son   . Colon cancer Neg Hx    Family Psychiatric  History: None per chart review.  Social History:  Social History   Substance and  Sexual Activity  Alcohol Use No  . Alcohol/week: 0.0 standard drinks     Social History   Substance and Sexual Activity  Drug Use No  . Types: Methylphenidate    Social History   Socioeconomic History  . Marital status: Widowed    Spouse name: Not on file  . Number of children: 3  . Years of education: Not on file  . Highest education level: Not on file  Occupational History  . Occupation: RETIRED  Social Needs  . Financial resource strain: Not on file  . Food insecurity:    Worry: Not on file    Inability: Not on file  . Transportation needs:    Medical: Not on file    Non-medical: Not on file  Tobacco Use  . Smoking status: Never Smoker  . Smokeless tobacco: Never Used  Substance and Sexual Activity  . Alcohol use: No    Alcohol/week: 0.0 standard drinks  . Drug use: No    Types: Methylphenidate  . Sexual activity: Never  Lifestyle  . Physical activity:    Days per week: Not on file    Minutes per session: Not on file  . Stress: Not on file  Relationships  . Social connections:    Talks on phone: Not on file    Gets together: Not on file    Attends religious service: Not on file    Active member of club or organization: Not on file    Attends meetings of clubs or organizations: Not on file    Relationship status: Not on file  Other Topics Concern  . Not on file  Social History Narrative   Family History Hypertension   No caffeine    Lives with husband in a 2 story home.  Retired.    Moved from [place] in 2002   Additional Social History: N/A    Allergies:   Allergies  Allergen Reactions  . Aricept [Donepezil Hcl]     ?dry tongue  . Coreg [Carvedilol]     ?problems  . Morphine Sulfate Other (See Comments)    REACTION: decreased blood pressure    Labs:  Results for orders placed or performed during the hospital encounter of 12/01/17 (from the past 48 hour(s))  CBG monitoring, ED     Status: Abnormal   Collection Time: 12/04/17  9:10 PM   Result Value Ref Range   Glucose-Capillary 148 (H) 70 - 99 mg/dL    Current Facility-Administered Medications  Medication Dose Route Frequency Provider Last Rate Last Dose  . amLODipine (NORVASC) tablet 5 mg  5 mg Oral Daily Dorie Rank, MD   5 mg at 12/06/17 0944  . aspirin EC tablet 81 mg  81 mg Oral Daily Dorie Rank, MD   81 mg at 12/06/17 9798  . cholecalciferol (VITAMIN D) tablet 2,000 Units  2,000 Units Oral Daily Dorie Rank, MD   2,000 Units at 12/06/17 0945  . DULoxetine (CYMBALTA) DR capsule 60 mg  60 mg Oral Daily Dorie Rank, MD   60 mg at 12/06/17 0943  . memantine (NAMENDA) tablet 10 mg  10 mg Oral  BID Dorie Rank, MD   10 mg at 12/06/17 0946  . metFORMIN (GLUCOPHAGE) tablet 500 mg  500 mg Oral BID WC Dorie Rank, MD   500 mg at 12/06/17 0944  . OLANZapine (ZYPREXA) tablet 10 mg  10 mg Oral QHS Dorie Rank, MD   10 mg at 12/05/17 2157  . OLANZapine (ZYPREXA) tablet 2.5 mg  2.5 mg Oral q morning - 10a Faythe Dingwall, DO   2.5 mg at 12/06/17 5400  . traZODone (DESYREL) tablet 100 mg  100 mg Oral QHS Valarie Merino, MD   100 mg at 12/05/17 2158   Current Outpatient Medications  Medication Sig Dispense Refill  . amLODipine (NORVASC) 5 MG tablet TAKE 1 TABLET BY MOUTH EVERY DAY 90 tablet 3  . aspirin EC 81 MG EC tablet Take 1 tablet (81 mg total) by mouth daily.    . Cholecalciferol (D3-50) 1.25 MG (50000 UT) capsule Take 1 capsule (50,000 Units total) by mouth every 30 (thirty) days. 3 capsule 3  . DULoxetine (CYMBALTA) 60 MG capsule Take 1 capsule (60 mg total) by mouth daily. 90 capsule 0  . memantine (NAMENDA) 10 MG tablet TAKE 1 TABLET BY MOUTH TWICE A DAY 180 tablet 2  . metFORMIN (GLUCOPHAGE) 500 MG tablet TAKE 1 TABLET BY MOUTH TWICE A DAY WITH MEALS 180 tablet 1  . OLANZapine (ZYPREXA) 10 MG tablet TAKE 1 TABLET BY MOUTH EVERYDAY AT BEDTIME 90 tablet 0  . diphenoxylate-atropine (LOMOTIL) 2.5-0.025 MG tablet Take 1 tablet by mouth 4 (four) times daily as needed for  diarrhea or loose stools. (Patient not taking: Reported on 12/01/2017) 60 tablet 1  . nitroGLYCERIN (NITROSTAT) 0.4 MG SL tablet DISSOLVE ONE TABLET UNDER THE TONGUE EVERY 5 MINUTES AS NEEDED FOR CHEST PAIN (Patient taking differently: Place 0.4 mg under the tongue every 5 (five) minutes as needed for chest pain. ) 25 tablet 0    Musculoskeletal: Strength & Muscle Tone: within normal limits Gait & Station: unsteady Patient leans: N/A  Psychiatric Specialty Exam: Physical Exam  Nursing note and vitals reviewed. Constitutional: She appears well-developed and well-nourished.  HENT:  Head: Normocephalic and atraumatic.  Neck: Normal range of motion.  Respiratory: Effort normal.  Musculoskeletal: Normal range of motion.  Neurological: She is alert.  Oriented to person only.   Psychiatric: Her affect is labile. Her speech is tangential. She is agitated. Thought content is paranoid and delusional. Cognition and memory are impaired. She expresses inappropriate judgment.    Review of Systems  Unable to perform ROS: Mental status change    Blood pressure 122/60, pulse 66, temperature 97.9 F (36.6 C), temperature source Oral, resp. rate 18, height 5\' 3"  (1.6 m), weight 77.1 kg, SpO2 97 %.Body mass index is 30.11 kg/m.  General Appearance: Fairly Groomed, elderly, [place] female, wearing paper hospital scrubs and standing in her room.   Eye Contact:  Fair  Speech:  UTA since patient will not speak to treatment team.  Volume:  Normal  Mood:  Irritable  Affect:  Labile  Thought Process:  Disorganized  Orientation:  Other:  Oriented to self only.  Thought Content:  UTA since patient will not speak to treatment team.  Suicidal Thoughts:  UTA since patient will not speak to treatment team.  Homicidal Thoughts:  UTA since patient will not speak to treatment team.  Memory:  UTA since patient will not speak to treatment team.  Judgement:  Impaired  Insight:  Lacking  Psychomotor Activity:   Decreased  Concentration:  Concentration: UTA since patient will not speak to treatment team. and Attention Span: UTA since patient will not speak to treatment team.  Recall:  UTA since patient will not speak to treatment team.  Fund of Knowledge:  UTA since patient will not speak to treatment team.  Language:  UTA since patient will not speak to treatment team.  Akathisia:  NA  Handed:  Right  AIMS (if indicated):   N/A  Assets:  Housing Social Support  ADL's:  Impaired  Cognition:  Impaired due to psychiatric condition.   Sleep:   N/A   Assessment:  Alice Wong is a 83 y.o. female who was admitted with agitation/psychosis. She continues to be physically aggressive, paranoid and threatening to harm self. She continues to warrant inpatient psychiatric hospitalization for stabilization and treatment.   Treatment Plan Summary: Daily contact with patient to assess and evaluate symptoms and progress in treatment and Medication management  -Continue Cymbalta 60 mg daily for depression. -Continue Memantine 10 mg BID for dementia.  -Continue Zyprexa 10 mg qhs for psychosis.  -Continue Zyprexa 2.5 mg QAM for mood stabilization.    Disposition: Recommend psychiatric Inpatient admission when medically cleared. TTS to seek placement in a gero psychiatric inpatient facility.   Ethelene Hal, NP 12/06/2017 11:44 AM   Patient seen face-to-face for psychiatric evaluation, chart reviewed and case discussed with the physician extender and developed treatment plan. Reviewed the information documented and agree with the treatment plan.  Buford Dresser, DO 12/06/17 12:26 PM

## 2017-12-07 DIAGNOSIS — F333 Major depressive disorder, recurrent, severe with psychotic symptoms: Secondary | ICD-10-CM | POA: Diagnosis not present

## 2017-12-07 MED ORDER — LORAZEPAM 2 MG/ML IJ SOLN
1.0000 mg | Freq: Once | INTRAMUSCULAR | Status: AC | PRN
  Administered 2017-12-07: 1 mg via INTRAMUSCULAR
  Filled 2017-12-07: qty 1

## 2017-12-07 MED ORDER — OLANZAPINE 5 MG PO TABS
5.0000 mg | ORAL_TABLET | Freq: Every morning | ORAL | Status: DC
  Administered 2017-12-08 – 2017-12-09 (×2): 5 mg via ORAL
  Filled 2017-12-07 (×2): qty 1

## 2017-12-07 MED ORDER — STERILE WATER FOR INJECTION IJ SOLN
INTRAMUSCULAR | Status: AC
  Administered 2017-12-07: 12:00:00
  Filled 2017-12-07: qty 10

## 2017-12-07 MED ORDER — OLANZAPINE 10 MG IM SOLR
5.0000 mg | Freq: Once | INTRAMUSCULAR | Status: AC
  Administered 2017-12-07: 5 mg via INTRAMUSCULAR
  Filled 2017-12-07: qty 10

## 2017-12-07 NOTE — ED Notes (Signed)
This nurse in pt room. Walle ipad russian interpretor utilized. This nurse explained [place] assessment d/t pt not forwarding comprehendsible information at this time. Safety sitter present. Pt complaint with  medication regimen. Will continue to monitor.

## 2017-12-07 NOTE — ED Notes (Signed)
Pt very agitated trying to push sitter in hallway. Attempted to redirect and deescalate pt but was unsuccessful. Pt was escorted back to room with myself and security.

## 2017-12-07 NOTE — ED Notes (Signed)
Pt remains agitated, anxious, yelling, screaming, behavior not redirectable. Margarita Grizzle NP notified.awaiting orders.

## 2017-12-07 NOTE — [facility] Counselor (Signed)
TTS Re-Assessment. TTS re-assessed patient with assistance of interpreter. Patient was smiling and pleasant, however thoughts continue to be disorganized. Patient stated, "I have been in this treatment facility for a long time. I cannot lie down or sit up comfortably. My leg exploded." Per Dr. Mariea Clonts and Jinny Blossom, NP patient continues to meet in patient criteria.

## 2017-12-07 NOTE — ED Notes (Signed)
Pt becoming increasingly agitated, anxious, tearful, This nurse notified Dr Mariea Clonts of pt behavior.

## 2017-12-07 NOTE — Progress Notes (Signed)
Patient meets criteria for inpatient treatment. No appropriate or available beds at [facility]. CSW refaxed referrals to the following facilities for review:  [facility], [facility], [facility], [facility], [facility], [facility], [facility], [facility], [facility], and [facility].  TTS will continue to seek bed placement.   Chalmers Guest. Guerry Bruin, MSW, [place] Work/Disposition Phone: [phone] Fax: [phone]

## 2017-12-08 DIAGNOSIS — F333 Major depressive disorder, recurrent, severe with psychotic symptoms: Secondary | ICD-10-CM | POA: Diagnosis not present

## 2017-12-08 DIAGNOSIS — F0391 Unspecified dementia with behavioral disturbance: Secondary | ICD-10-CM | POA: Diagnosis not present

## 2017-12-08 LAB — CBG MONITORING, ED: Glucose-Capillary: 145 mg/dL — ABNORMAL HIGH (ref 70–99)

## 2017-12-08 MED ORDER — OLANZAPINE 5 MG PO TABS: 5.0000 mg | ORAL_TABLET | Freq: Every morning | ORAL | 0 refills | Status: DC

## 2017-12-08 MED ORDER — TRAZODONE HCL 100 MG PO TABS: 100.0000 mg | ORAL_TABLET | Freq: Every day | ORAL | 0 refills | Status: DC

## 2017-12-08 MED ORDER — OLANZAPINE 10 MG PO TABS: 10.0000 mg | ORAL_TABLET | Freq: Every day | ORAL | 0 refills | Status: DC

## 2017-12-08 NOTE — Progress Notes (Signed)
Pt's daughter, [name], got pt a bed at [facility]. Pt's daughter will be to ED tomorrow at 10 am.   Wendelyn Breslow, [facility]  [phone]

## 2017-12-08 NOTE — ED Notes (Signed)
Eating lunch w/ assist

## 2017-12-08 NOTE — Progress Notes (Signed)
CSW aware patient has been psychiatrically and medically cleared for discharge. CSW reached out to patient's daughter [name] [phone] regarding patient disposition. Patient's daughter expressed concern with patient being discharged. CSW explained that patient was evaluated by the psychiatry team who state patient is stable for discharge. Patient's daughter not able to come pick up patient until 4:30pm. Patient would like to meet with CSW face-to-face to discuss ALF/Memory Care options. CSW explained that the 2nd shift CSW would be covering remotely from [facility] but could speak with patient over the phone. CSW to leave handoff for 2nd shift CSW.  Ollen Barges, [place] Work Department  [employer]  [phone]

## 2017-12-08 NOTE — ED Notes (Signed)
Dr Dwyane Dee and Theodoro Clock DNP into see

## 2017-12-08 NOTE — ED Notes (Signed)
Dr Leone Brand DNP into talk with pt via [facility] translator, pt laying on the bed alert, but would not respond to any questions.

## 2017-12-08 NOTE — ED Notes (Signed)
Up in hall talking to sitters in ?[place]

## 2017-12-08 NOTE — ED Notes (Signed)
Alice Wong Pt's daughter called to verify FL2 has been filled out, [place] CSW contacted and will contact daughter.

## 2017-12-08 NOTE — ED Notes (Signed)
Sitting quielty in chair in room, nad

## 2017-12-08 NOTE — ED Notes (Signed)
Patient hs to have a lot of redirection at beginning of shift. Patient eventually went to sleep and has slept most of the night. Respirations equal and unlabored, skin warm and dry. NAD.

## 2017-12-08 NOTE — Consult Note (Addendum)
[facility] Psych ED Discharge  12/08/2017 9:47 AM Alice Wong  MRN:  [id number] Principal Problem: Dementia with behavioral disturbance [facility]) Discharge Diagnoses: Principal Problem:   Dementia with behavioral disturbance [facility])  Subjective: 83 yo female who was brought to the ED for behavior issues, wandering out of the house.  History of dementia with behavioral disturbances, Zyprexa was started and adjusted.  Appears to be at her baseline, no suicidal/homicidal ideations, hallucinations, and substance abuse.  Stable for discharge.  Total Time spent with patient: 20 minutes  Past Psychiatric History: dementia, depression  Past Medical History:  Past Medical History:  Diagnosis Date  . Anxiety   . Cholelithiasis   . CVA (cerebral vascular accident) ([place])   . Depression   . Diverticulosis   . GERD (gastroesophageal reflux disease)   . Gout 2009  . H. pylori infection 2011  . Hyperlipidemia   . Hypertension   . Memory loss   . MR (mitral regurgitation)   . Osteoporosis   . S/P cardiac cath 12/20/13 12/20/2013  . Thrombocytopenia ([place])   . Thyroid cyst   . Vaso-vagal reaction   . Vitamin D deficiency    Past Surgical History:  Procedure Laterality Date  . CESAREAN SECTION    . CHOLECYSTECTOMY  2010  . LASIK    . LEFT HEART CATHETERIZATION WITH CORONARY ANGIOGRAM N/A 12/20/2013   Procedure: LEFT HEART CATHETERIZATION WITH CORONARY ANGIOGRAM;  Surgeon: Burnell Blanks, MD;  Location: [facility] CATH LAB;  Service: Cardiovascular;  Laterality: N/A;   Family History:  Family History  Problem Relation Age of Onset  . Lung disease Father        Deceased  . Hypertension Other   . Stomach cancer Brother   . Ulcers Son   . Colon cancer Neg Hx    Family Psychiatric  History: none Social History:  Social History   Substance and Sexual Activity  Alcohol Use No  . Alcohol/week: 0.0 standard drinks    Social History   Substance and Sexual Activity  Drug Use No  . Types:  Methylphenidate   Social History   Socioeconomic History  . Marital status: Widowed    Spouse name: Not on file  . Number of children: 3  . Years of education: Not on file  . Highest education level: Not on file  Occupational History  . Occupation: RETIRED  Social Needs  . Financial resource strain: Not on file  . Food insecurity:    Worry: Not on file    Inability: Not on file  . Transportation needs:    Medical: Not on file    Non-medical: Not on file  Tobacco Use  . Smoking status: Never Smoker  . Smokeless tobacco: Never Used  Substance and Sexual Activity  . Alcohol use: No    Alcohol/week: 0.0 standard drinks  . Drug use: No    Types: Methylphenidate  . Sexual activity: Never  Lifestyle  . Physical activity:    Days per week: Not on file    Minutes per session: Not on file  . Stress: Not on file  Relationships  . Social connections:    Talks on phone: Not on file    Gets together: Not on file    Attends religious service: Not on file    Active member of club or organization: Not on file    Attends meetings of clubs or organizations: Not on file    Relationship status: Not on file  Other Topics Concern  .  Not on file  Social History Narrative   Family History Hypertension   No caffeine    Lives with husband in a 2 story home.  Retired.    Moved from [place] in 2002    Has this patient used any form of tobacco in the last 30 days? (Cigarettes, Smokeless Tobacco, Cigars, and/or Pipes) NOne  Current Medications: Current Facility-Administered Medications  Medication Dose Route Frequency Provider Last Rate Last Dose  . amLODipine (NORVASC) tablet 5 mg  5 mg Oral Daily Dorie Rank, MD   5 mg at 12/07/17 0950  . aspirin EC tablet 81 mg  81 mg Oral Daily Dorie Rank, MD   81 mg at 12/07/17 0950  . cholecalciferol (VITAMIN D) tablet 2,000 Units  2,000 Units Oral Daily Dorie Rank, MD   2,000 Units at 12/07/17 0950  . DULoxetine (CYMBALTA) DR capsule 60 mg  60 mg Oral  Daily Dorie Rank, MD   60 mg at 12/07/17 0950  . memantine (NAMENDA) tablet 10 mg  10 mg Oral BID Dorie Rank, MD   10 mg at 12/07/17 2135  . metFORMIN (GLUCOPHAGE) tablet 500 mg  500 mg Oral BID WC Dorie Rank, MD   500 mg at 12/07/17 1817  . OLANZapine (ZYPREXA) tablet 10 mg  10 mg Oral QHS Dorie Rank, MD   10 mg at 12/07/17 2136  . OLANZapine (ZYPREXA) tablet 5 mg  5 mg Oral q morning - 10a Faythe Dingwall, DO      . traZODone (DESYREL) tablet 100 mg  100 mg Oral QHS Valarie Merino, MD   100 mg at 12/07/17 2136   Current Outpatient Medications  Medication Sig Dispense Refill  . amLODipine (NORVASC) 5 MG tablet TAKE 1 TABLET BY MOUTH EVERY DAY 90 tablet 3  . aspirin EC 81 MG EC tablet Take 1 tablet (81 mg total) by mouth daily.    . Cholecalciferol (D3-50) 1.25 MG (50000 UT) capsule Take 1 capsule (50,000 Units total) by mouth every 30 (thirty) days. 3 capsule 3  . DULoxetine (CYMBALTA) 60 MG capsule Take 1 capsule (60 mg total) by mouth daily. 90 capsule 0  . memantine (NAMENDA) 10 MG tablet TAKE 1 TABLET BY MOUTH TWICE A DAY 180 tablet 2  . metFORMIN (GLUCOPHAGE) 500 MG tablet TAKE 1 TABLET BY MOUTH TWICE A DAY WITH MEALS 180 tablet 1  . OLANZapine (ZYPREXA) 10 MG tablet TAKE 1 TABLET BY MOUTH EVERYDAY AT BEDTIME 90 tablet 0  . diphenoxylate-atropine (LOMOTIL) 2.5-0.025 MG tablet Take 1 tablet by mouth 4 (four) times daily as needed for diarrhea or loose stools. (Patient not taking: Reported on 12/01/2017) 60 tablet 1  . nitroGLYCERIN (NITROSTAT) 0.4 MG SL tablet DISSOLVE ONE TABLET UNDER THE TONGUE EVERY 5 MINUTES AS NEEDED FOR CHEST PAIN (Patient taking differently: Place 0.4 mg under the tongue every 5 (five) minutes as needed for chest pain. ) 25 tablet 0   PTA Medications:  (Not in a hospital admission)  Musculoskeletal: Strength & Muscle Tone: within normal limits Gait & Station: normal Patient leans: N/A  Psychiatric Specialty Exam: Physical Exam  Nursing note and vitals  reviewed. Constitutional: She appears well-developed and well-nourished.  HENT:  Head: Normocephalic.  Neck: Normal range of motion.  Respiratory: Effort normal.  Musculoskeletal: Normal range of motion.  Neurological: She is alert.  Psychiatric: Her speech is normal and behavior is normal. Thought content normal. Her mood appears anxious. Her affect is blunt. Cognition and memory are impaired. She expresses  impulsivity.    Review of Systems  Psychiatric/Behavioral: Positive for memory loss. The patient is nervous/anxious.   All other systems reviewed and are negative.   Blood pressure (!) 158/70, pulse 88, temperature 98.2 F (36.8 C), temperature source Oral, resp. rate 18, height 5\' 3"  (1.6 m), weight 77.1 kg, SpO2 98 %.Body mass index is 30.11 kg/m.  General Appearance: Casual  Eye Contact:  Good  Speech:  Garbled and Normal Rate  Volume:  Normal  Mood:  Anxious, mild  Affect:  Blunt  Thought Process:  Coherent and Descriptions of Associations: Intact  Orientation:  Other:  alert  Thought Content:  Logical  Suicidal Thoughts:  No  Homicidal Thoughts:  No  Memory:  Immediate;   Fair Recent;   Poor Remote;   Fair  Judgement:  Impaired  Insight:  Lacking  Psychomotor Activity:  Normal  Concentration:  Concentration: Fair and Attention Span: Fair  Recall:  Poor  Fund of Knowledge:  Fair  Language:  Fair  Akathisia:  No  Handed:  Right  AIMS (if indicated):     Assets:  Housing Leisure Time Resilience Social Support  ADL's:  Impaired  Cognition:  Impaired,  Moderate  Sleep:       Demographic Factors:  Caucasian  Loss Factors: NA  Historical Factors: Impulsivity  Risk Reduction Factors:   Living with another person, especially a relative, Positive social support and Positive therapeutic relationship  Continued Clinical Symptoms:  Anxiety, mild  Cognitive Features That Contribute To Risk:  Loss of executive function    Suicide Risk:  None   Plan Of  Care/Follow-up recommendations:  Dementia with behavioral disturbances: -Continue Zyprexa 5 mg in the am and 10 mg in the pm -Continue Namenda 10 mg BID  -Continue Cymbalta 60 mg daily for depression  Activity:  as tolerated Diet:  heart healthy diet  Disposition: discharge home Waylan Boga, NP 12/08/2017, 9:47 AM

## 2017-12-08 NOTE — ED Notes (Signed)
Up to the bathroom w/ assist

## 2017-12-08 NOTE — ED Notes (Signed)
Pt medicated and assisted with eating breakfast.

## 2017-12-08 NOTE — BH Assessment (Signed)
[place] Assessment Progress Note  Per Hampton Abbot, MD, this pt does not meet criteria for IVC, and she does not require psychiatric hospitalization at this time.  Pt is psychiatrically cleared.  Pt presents under IVC initiated by pt's daughter, but it expires today at 13:33.  Dr Dwyane Dee has signed a Notice of Commitment Change releasing pt from IVC as of the present.  Ollen Barges, LCSW agrees to facilitate pt's return to the community.  Pt's nurse, Narda Rutherford, has been notified.  Jalene Mullet, [place] Triage Specialist [phone]

## 2017-12-08 NOTE — ED Notes (Signed)
[place] DNP into see

## 2017-12-08 NOTE — ED Notes (Signed)
Up walking in the hall with the sitter, difficult to redirect at times, PO fluids offered.

## 2017-12-08 NOTE — Progress Notes (Addendum)
[facility] CSW spoke with pt's daughter, [name], at [phone]. [name] expressed concerns about pt being cleared. Pt's daughter wants to speak with someone about pt's clearance. CSW explained that psych team has found pt to be psychiatrically cleared. Pt's daughter requesting to speak with someone part of the psych team in regards to decision for psychiatric clearance.   4:35 PM CSW spoke with pt's daughter, [name]. [name] stated she is willing to come pick pt up tomorrow at 10 am, but continues to request to speak with pt's physician completing clearance evaluation. CSW explained that pt has been cleared due to observations and interactions with pt. CSW explained that pt continues to be psychiatrically cleared and ready for discharge now. [name] continued to state that she would not be able to pick pt up until tomorrow.   Pt's daughter requested assistance placing pt in ALF/ Memory Care. CSW explained that pt cannot be placed in an ALF or Memory Care from the ED due to pt no longer meeting the acuity level to stay in the ED. Pt's daughter gave CSW permission to reach out to [place] with [facility], who provides assists placement in ALF/ Memory Care from the community. [name] expressed appreciation for this help and willingness to work with [facility].   CSW spoke with Thayer Headings. Explained pt's situation, pt being cleared, and daughter's concerns. Thayer Headings will work with family to provide additional support in the community.  CSW will continue to follow.   Wendelyn Breslow, [facility]  [phone]

## 2017-12-09 ENCOUNTER — Ambulatory Visit (INDEPENDENT_AMBULATORY_CARE_PROVIDER_SITE_OTHER): Payer: Medicare Other | Admitting: Internal Medicine

## 2017-12-09 ENCOUNTER — Encounter: Payer: Self-pay | Admitting: Internal Medicine

## 2017-12-09 DIAGNOSIS — I10 Essential (primary) hypertension: Secondary | ICD-10-CM | POA: Diagnosis not present

## 2017-12-09 DIAGNOSIS — F333 Major depressive disorder, recurrent, severe with psychotic symptoms: Secondary | ICD-10-CM | POA: Diagnosis not present

## 2017-12-09 DIAGNOSIS — F0281 Dementia in other diseases classified elsewhere with behavioral disturbance: Secondary | ICD-10-CM

## 2017-12-09 DIAGNOSIS — G301 Alzheimer's disease with late onset: Secondary | ICD-10-CM

## 2017-12-09 DIAGNOSIS — R451 Restlessness and agitation: Secondary | ICD-10-CM | POA: Insufficient documentation

## 2017-12-09 DIAGNOSIS — I63419 Cerebral infarction due to embolism of unspecified middle cerebral artery: Secondary | ICD-10-CM

## 2017-12-09 DIAGNOSIS — F22 Delusional disorders: Secondary | ICD-10-CM | POA: Diagnosis not present

## 2017-12-09 MED ORDER — LORAZEPAM 2 MG PO TABS: 2.0000 mg | ORAL_TABLET | Freq: Four times a day (QID) | ORAL | 1 refills | Status: DC | PRN

## 2017-12-09 NOTE — Assessment & Plan Note (Signed)
Lorazepam prn NHP

## 2017-12-09 NOTE — Discharge Instructions (Signed)
Continue medications as previously prescribed.  Follow-up with your primary doctor in the next week.

## 2017-12-09 NOTE — ED Notes (Addendum)
Pt resting in bed with even unlabored respirations. No distress noted. Will continue to monitor.

## 2017-12-09 NOTE — ED Notes (Signed)
Pt resting in bed with even unlabored respirations. No distress noted. Will continue to monitor.

## 2017-12-09 NOTE — Progress Notes (Signed)
Subjective:  Patient ID: Alice Wong, female    DOB: 1934-12-15  Age: 83 y.o. MRN: [id number]  CC: No chief complaint on file.   HPI Alice Wong presents for post-hosp f/u for psychotic depression. She was d/c'd earlier today. She needs FL2 form for NHP C/o agitation on current meds, more at night  Outpatient Medications Prior to Visit  Medication Sig Dispense Refill  . amLODipine (NORVASC) 5 MG tablet TAKE 1 TABLET BY MOUTH EVERY DAY 90 tablet 3  . aspirin EC 81 MG EC tablet Take 1 tablet (81 mg total) by mouth daily.    . Cholecalciferol (D3-50) 1.25 MG (50000 UT) capsule Take 1 capsule (50,000 Units total) by mouth every 30 (thirty) days. 3 capsule 3  . DULoxetine (CYMBALTA) 60 MG capsule Take 1 capsule (60 mg total) by mouth daily. 90 capsule 0  . memantine (NAMENDA) 10 MG tablet TAKE 1 TABLET BY MOUTH TWICE A DAY 180 tablet 2  . metFORMIN (GLUCOPHAGE) 500 MG tablet TAKE 1 TABLET BY MOUTH TWICE A DAY WITH MEALS 180 tablet 1  . nitroGLYCERIN (NITROSTAT) 0.4 MG SL tablet DISSOLVE ONE TABLET UNDER THE TONGUE EVERY 5 MINUTES AS NEEDED FOR CHEST PAIN (Patient taking differently: Place 0.4 mg under the tongue every 5 (five) minutes as needed for chest pain. ) 25 tablet 0  . OLANZapine (ZYPREXA) 10 MG tablet Take 1 tablet (10 mg total) by mouth at bedtime. 30 tablet 0  . OLANZapine (ZYPREXA) 5 MG tablet Take 1 tablet (5 mg total) by mouth every morning. 30 tablet 0  . traZODone (DESYREL) 100 MG tablet Take 1 tablet (100 mg total) by mouth at bedtime. 30 tablet 0   No facility-administered medications prior to visit.     ROS: Review of Systems  Constitutional: Negative for activity change, appetite change, chills, fatigue and unexpected weight change.  HENT: Negative for congestion, mouth sores and sinus pressure.   Eyes: Negative for visual disturbance.  Respiratory: Negative for cough and chest tightness.   Gastrointestinal: Negative for abdominal pain and nausea.    Genitourinary: Negative for difficulty urinating, frequency and vaginal pain.  Musculoskeletal: Negative for back pain and gait problem.  Skin: Negative for pallor and rash.  Neurological: Positive for headaches. Negative for dizziness, tremors, weakness and numbness.  Psychiatric/Behavioral: Positive for agitation, behavioral problems, confusion, decreased concentration, dysphoric mood and hallucinations. Negative for self-injury, sleep disturbance and suicidal ideas. The patient is nervous/anxious and is hyperactive.     Objective:  BP (!) 152/90 (BP Location: Right Arm, Patient Position: Sitting, Cuff Size: Normal)   Pulse 97   Temp 98.6 F (37 C) (Oral)   SpO2 97%   BP Readings from Last 3 Encounters:  12/09/17 (!) 152/90  12/08/17 139/87  08/15/17 118/72    Wt Readings from Last 3 Encounters:  12/01/17 170 lb (77.1 kg)  08/15/17 147 lb (66.7 kg)  03/14/17 153 lb (69.4 kg)    Physical Exam  Constitutional: She appears well-developed. No distress.  HENT:  Head: Normocephalic.  Right Ear: External ear normal.  Left Ear: External ear normal.  Nose: Nose normal.  Mouth/Throat: Oropharynx is clear and moist.  Eyes: Pupils are equal, round, and reactive to light. Conjunctivae are normal. Right eye exhibits no discharge. Left eye exhibits no discharge.  Neck: Normal range of motion. Neck supple. No JVD present. No tracheal deviation present. No thyromegaly present.  Cardiovascular: Normal rate, regular rhythm and normal heart sounds.  Pulmonary/Chest: No stridor. No  respiratory distress. She has no wheezes.  Abdominal: Soft. Bowel sounds are normal. She exhibits no distension and no mass. There is no tenderness. There is no rebound and no guarding.  Musculoskeletal: She exhibits no edema or tenderness.  Lymphadenopathy:    She has no cervical adenopathy.  Neurological: She displays normal reflexes. No cranial nerve deficit. She exhibits normal muscle tone. Coordination  normal.  Skin: No rash noted. No erythema.  Aphasic at times She was agitated and combative in the waiting area, then settled down In a w/c - it had a calming effect on her  Confused  Lab Results  Component Value Date   WBC 6.8 12/01/2017   HGB 13.3 12/01/2017   HCT 39.8 12/01/2017   PLT 252 12/01/2017   GLUCOSE 180 (H) 12/01/2017   CHOL 166 08/23/2011   TRIG 115.0 08/23/2011   HDL 36.90 (L) 08/23/2011   LDLCALC 106 (H) 08/23/2011   ALT 17 12/01/2017   AST 19 12/01/2017   NA 142 12/01/2017   K 3.7 12/01/2017   CL 105 12/01/2017   CREATININE 0.95 12/01/2017   BUN 28 (H) 12/01/2017   CO2 28 12/01/2017   TSH 1.82 08/15/2017   INR 1.05 12/17/2013   HGBA1C 5.8 08/15/2017    No results found.  Assessment & Plan:   There are no diagnoses linked to this encounter.   No orders of the defined types were placed in this encounter.    Follow-up: No follow-ups on file.  Walker Kehr, MD

## 2017-12-09 NOTE — Progress Notes (Addendum)
CSW received handoff from 2nd shift CSW. Per notes, patient's daughter has found patient a bed at [facility]. Per notes, patient's daughter will be here to get her at 10 this morning. CSW to update patient's EDP and RN.   9:32am- CSW spoke with daughter, [name], who reported patient was not safe to return home and asked to meet with CSW in person. Per [name], she would like patient to go to [facility] as that is close to her house. CSW explained that the ED would not hold patient for placement. CSW to meet with [name] and patient at 11:00am.  11:04am- CSW spoke with [name] who asked that FL2 be completed by the hospital. CSW explained that the [facility] would need to be completed by her PCP as he knows patient best. CSW reiterated that patient could not be held in the emergency department to wait for placement. [name] expressed understanding and stated patient had a PCP appointment at 2:40pm. [name] agreeable to take patient home and is working to get patient long-term Medicaid and get patient into [facility]. No further CSW needs at this time. Please reconsult if needs arise.  Ollen Barges, [place] Work Department  [employer]  [phone]

## 2017-12-09 NOTE — ED Notes (Signed)
Pt discharged safely with daughter.  Pt was escorted out by wheelchair.  All belongings were returned to pt.  Pt was in no distress.

## 2017-12-09 NOTE — Assessment & Plan Note (Signed)
On ASA ?11/19 - she has a new aphasia. Consider Plavix

## 2017-12-09 NOTE — NC FL2 (Addendum)
[place] LEVEL OF CARE SCREENING TOOL     IDENTIFICATION  Patient Name: Alice Wong Birthdate: [birth year] Sex: female Admission Date (Current Location): 12/01/2017  [facility] and [place] Number:  Herbalist and Address:  [facility],  [place] [address] [place]      Provider Number: [id number]  Attending Physician Name and Address:  Default, Provider, MD  Relative Name and Phone Number:       Current Level of Care: Hospital Recommended Level of Care: [place] Prior Approval Number:    Date Approved/Denied:   PASRR Number:    Discharge Plan: Home    Current Diagnoses: Patient Active Problem List   Diagnosis Date Noted  . Paranoia ([place])   . Incontinence of bowel 08/15/2017  . Chest wall contusion 02/21/2017  . Diarrhea 09/22/2015  . Redness of eye, right 02/15/2015  . Noncompliance with medication regimen 11/15/2014  . Dementia with behavioral disturbance ([place]) 07/28/2014  . Cerebral infarction ([place]) 07/21/2014  . Sternum pain 04/21/2014  . Costochondritis 04/13/2014  . Simple chronic bronchitis ([place]) 04/13/2014  . Cough 03/22/2014  . S/P cardiac cath 12/20/13, non obsteructive disease 12/20/2013  . Abnormal nuclear stress test, false positive per cath   . Vasovagal syncope 12/17/2013  . Midsternal chest pain 12/17/2013  . Neoplasm of uncertain behavior of skin 10/27/2013  . Uncontrolled type 2 diabetes with renal manifestation ([place]) 11/04/2012  . Pain of right thumb 12/20/2011  . Psychosomatic disorder 08/23/2011  . Tinnitus 06/04/2011  . Headache, post-traumatic, chronic 05/01/2011  . LBP (low back pain) 01/30/2011  . Insomnia 11/19/2010  . Actinic keratoses 05/23/2010  . Dizziness 04/09/2010  . Bruising 04/09/2010  . Thrombocytopenia ([place]) 01/02/2010  . HEMORRHOIDS 12/12/2009  . HELICOBACTER PYLORI INFECTION 03/17/2009  . PARESTHESIA 03/17/2009  . Chest pain, unspecified 08/01/2008  .  Vitamin D deficiency 03/03/2008  . GAIT IMBALANCE 10/22/2007  . Gout, unspecified 05/07/2007  . FOOT PAIN 05/07/2007  . Dyslipidemia 02/05/2007  . Anxiety state 02/05/2007  . Depression with anxiety 02/05/2007  . Essential hypertension 02/05/2007  . GERD 02/05/2007  . OSTEOPOROSIS 02/05/2007  . Memory loss 02/05/2007  . History of cardiovascular disorder 02/05/2007    Orientation RESPIRATION BLADDER Height & Weight     Self  Normal Continent Weight: 170 lb (77.1 kg) Height:  5\' 3"  (160 cm)  BEHAVIORAL SYMPTOMS/MOOD NEUROLOGICAL BOWEL NUTRITION STATUS      Continent Diet(Normal)  AMBULATORY STATUS COMMUNICATION OF NEEDS Skin   Independent Verbally Normal                       Personal Care Assistance Level of Assistance  Bathing, Feeding, Dressing Bathing Assistance: Limited assistance Feeding assistance: Independent Dressing Assistance: Independent     Functional Limitations Info             SPECIAL CARE FACTORS FREQUENCY                       Contractures      Additional Factors Info  Code Status Code Status Info: Prior             Current Medications (12/09/2017):  This is the current hospital active medication list Current Facility-Administered Medications  Medication Dose Route Frequency Provider Last Rate Last Dose  . amLODipine (NORVASC) tablet 5 mg  5 mg Oral Daily Dorie Rank, MD   5 mg at 12/09/17 1005  . aspirin EC  tablet 81 mg  81 mg Oral Daily Dorie Rank, MD   81 mg at 12/09/17 1005  . cholecalciferol (VITAMIN D) tablet 2,000 Units  2,000 Units Oral Daily Dorie Rank, MD   2,000 Units at 12/09/17 1006  . DULoxetine (CYMBALTA) DR capsule 60 mg  60 mg Oral Daily Dorie Rank, MD   60 mg at 12/09/17 1006  . memantine (NAMENDA) tablet 10 mg  10 mg Oral BID Dorie Rank, MD   10 mg at 12/09/17 1005  . metFORMIN (GLUCOPHAGE) tablet 500 mg  500 mg Oral BID WC Dorie Rank, MD   500 mg at 12/09/17 0901  . OLANZapine (ZYPREXA) tablet 10 mg  10 mg  Oral QHS Dorie Rank, MD   10 mg at 12/08/17 2120  . OLANZapine (ZYPREXA) tablet 5 mg  5 mg Oral q morning - 10a Faythe Dingwall, DO   5 mg at 12/09/17 1006  . traZODone (DESYREL) tablet 100 mg  100 mg Oral QHS Valarie Merino, MD   100 mg at 12/08/17 2120   Current Outpatient Medications  Medication Sig Dispense Refill  . amLODipine (NORVASC) 5 MG tablet TAKE 1 TABLET BY MOUTH EVERY DAY 90 tablet 3  . aspirin EC 81 MG EC tablet Take 1 tablet (81 mg total) by mouth daily.    . Cholecalciferol (D3-50) 1.25 MG (50000 UT) capsule Take 1 capsule (50,000 Units total) by mouth every 30 (thirty) days. 3 capsule 3  . DULoxetine (CYMBALTA) 60 MG capsule Take 1 capsule (60 mg total) by mouth daily. 90 capsule 0  . memantine (NAMENDA) 10 MG tablet TAKE 1 TABLET BY MOUTH TWICE A DAY 180 tablet 2  . metFORMIN (GLUCOPHAGE) 500 MG tablet TAKE 1 TABLET BY MOUTH TWICE A DAY WITH MEALS 180 tablet 1  . nitroGLYCERIN (NITROSTAT) 0.4 MG SL tablet DISSOLVE ONE TABLET UNDER THE TONGUE EVERY 5 MINUTES AS NEEDED FOR CHEST PAIN (Patient taking differently: Place 0.4 mg under the tongue every 5 (five) minutes as needed for chest pain. ) 25 tablet 0  . OLANZapine (ZYPREXA) 10 MG tablet Take 1 tablet (10 mg total) by mouth at bedtime. 30 tablet 0  . OLANZapine (ZYPREXA) 5 MG tablet Take 1 tablet (5 mg total) by mouth every morning. 30 tablet 0  . traZODone (DESYREL) 100 MG tablet Take 1 tablet (100 mg total) by mouth at bedtime. 30 tablet 0     Discharge Medications: Please see discharge summary for a list of discharge medications.  Relevant Imaging Results:  Relevant Lab Results:   Additional Information SSN: [id number]  Ollen Barges, LCSWA

## 2017-12-09 NOTE — ED Notes (Signed)
Pt. Urinated in the corner of the room. NT and RN cleaned floor

## 2017-12-09 NOTE — Assessment & Plan Note (Signed)
BP Readings from Last 3 Encounters:  12/09/17 (!) 152/90  12/08/17 139/87  08/15/17 118/72

## 2017-12-09 NOTE — Assessment & Plan Note (Addendum)
FL2 filled out for SNF Chart reviewed Lorazepam prn

## 2017-12-09 NOTE — Assessment & Plan Note (Signed)
FL2 filled out for SNF Chart reviewed Lorazepam prn Zyprexa Trazodone

## 2017-12-19 ENCOUNTER — Ambulatory Visit: Payer: Medicare Other | Admitting: Neurology

## 2017-12-19 ENCOUNTER — Telehealth: Payer: Self-pay | Admitting: Neurology

## 2017-12-19 NOTE — Telephone Encounter (Signed)
This patient did not show for a new patient appointment today. 

## 2017-12-22 ENCOUNTER — Encounter: Payer: Self-pay | Admitting: Neurology

## 2017-12-29 ENCOUNTER — Telehealth: Payer: Self-pay | Admitting: Internal Medicine

## 2017-12-29 DIAGNOSIS — Z111 Encounter for screening for respiratory tuberculosis: Secondary | ICD-10-CM

## 2017-12-29 NOTE — Telephone Encounter (Signed)
Okay to order blood work?

## 2017-12-29 NOTE — Telephone Encounter (Signed)
Copied from [place] [id number]. Topic: General - Inquiry >> Dec 29, 2017 11:48 AM Margot Ables wrote: Reason for CRM: pt is needing a TB Quantiferon Gold test to be done to get her placed in a facility. They are hoping to get her in this week but need test to be completed before admission. Please call to advise.

## 2017-12-30 ENCOUNTER — Other Ambulatory Visit: Payer: Medicare Other

## 2017-12-30 DIAGNOSIS — Z111 Encounter for screening for respiratory tuberculosis: Secondary | ICD-10-CM | POA: Diagnosis not present

## 2017-12-30 DIAGNOSIS — Z022 Encounter for examination for admission to residential institution: Secondary | ICD-10-CM | POA: Diagnosis not present

## 2017-12-30 NOTE — Telephone Encounter (Signed)
Ok Thx 

## 2017-12-30 NOTE — Telephone Encounter (Signed)
Lab ordered, daughter notified

## 2018-01-01 DIAGNOSIS — Z23 Encounter for immunization: Secondary | ICD-10-CM | POA: Diagnosis not present

## 2018-01-01 LAB — QUANTIFERON-TB GOLD PLUS
Mitogen-NIL: 8.38 IU/mL
NIL: 0.04 IU/mL
QuantiFERON-TB Gold Plus: NEGATIVE
TB1-NIL: 0.15 IU/mL
TB2-NIL: 0.16 IU/mL

## 2018-01-05 DIAGNOSIS — F419 Anxiety disorder, unspecified: Secondary | ICD-10-CM | POA: Diagnosis not present

## 2018-01-05 DIAGNOSIS — F0391 Unspecified dementia with behavioral disturbance: Secondary | ICD-10-CM | POA: Diagnosis not present

## 2018-01-05 DIAGNOSIS — E118 Type 2 diabetes mellitus with unspecified complications: Secondary | ICD-10-CM | POA: Diagnosis not present

## 2018-01-05 DIAGNOSIS — I639 Cerebral infarction, unspecified: Secondary | ICD-10-CM | POA: Diagnosis not present

## 2018-01-05 DIAGNOSIS — F22 Delusional disorders: Secondary | ICD-10-CM | POA: Diagnosis not present

## 2018-01-12 DIAGNOSIS — S0990XA Unspecified injury of head, initial encounter: Secondary | ICD-10-CM | POA: Diagnosis not present

## 2018-01-12 DIAGNOSIS — W19XXXA Unspecified fall, initial encounter: Secondary | ICD-10-CM | POA: Diagnosis not present

## 2018-01-12 DIAGNOSIS — R404 Transient alteration of awareness: Secondary | ICD-10-CM | POA: Diagnosis not present

## 2018-01-13 ENCOUNTER — Encounter (HOSPITAL_COMMUNITY): Payer: Self-pay

## 2018-01-13 ENCOUNTER — Other Ambulatory Visit: Payer: Self-pay

## 2018-01-13 ENCOUNTER — Emergency Department (HOSPITAL_COMMUNITY)
Admission: EM | Admit: 2018-01-13 | Discharge: 2018-01-13 | Disposition: A | Discharge status: Home / Self Care | Payer: Medicare Other | Attending: Emergency Medicine | Admitting: Emergency Medicine

## 2018-01-13 DIAGNOSIS — Z79899 Other long term (current) drug therapy: Secondary | ICD-10-CM | POA: Diagnosis not present

## 2018-01-13 DIAGNOSIS — M2550 Pain in unspecified joint: Secondary | ICD-10-CM | POA: Diagnosis not present

## 2018-01-13 DIAGNOSIS — Z043 Encounter for examination and observation following other accident: Secondary | ICD-10-CM | POA: Insufficient documentation

## 2018-01-13 DIAGNOSIS — Z7401 Bed confinement status: Secondary | ICD-10-CM | POA: Diagnosis not present

## 2018-01-13 DIAGNOSIS — W06XXXA Fall from bed, initial encounter: Secondary | ICD-10-CM | POA: Diagnosis not present

## 2018-01-13 DIAGNOSIS — F0391 Unspecified dementia with behavioral disturbance: Secondary | ICD-10-CM | POA: Diagnosis not present

## 2018-01-13 DIAGNOSIS — Z7982 Long term (current) use of aspirin: Secondary | ICD-10-CM | POA: Insufficient documentation

## 2018-01-13 DIAGNOSIS — I10 Essential (primary) hypertension: Secondary | ICD-10-CM | POA: Diagnosis not present

## 2018-01-13 DIAGNOSIS — F0390 Unspecified dementia without behavioral disturbance: Secondary | ICD-10-CM | POA: Insufficient documentation

## 2018-01-13 DIAGNOSIS — F0281 Dementia in other diseases classified elsewhere with behavioral disturbance: Secondary | ICD-10-CM | POA: Diagnosis not present

## 2018-01-13 DIAGNOSIS — Z9181 History of falling: Secondary | ICD-10-CM | POA: Diagnosis not present

## 2018-01-13 DIAGNOSIS — F419 Anxiety disorder, unspecified: Secondary | ICD-10-CM | POA: Diagnosis not present

## 2018-01-13 DIAGNOSIS — F22 Delusional disorders: Secondary | ICD-10-CM | POA: Diagnosis not present

## 2018-01-13 DIAGNOSIS — Z7984 Long term (current) use of oral hypoglycemic drugs: Secondary | ICD-10-CM | POA: Diagnosis not present

## 2018-01-13 DIAGNOSIS — F331 Major depressive disorder, recurrent, moderate: Secondary | ICD-10-CM | POA: Diagnosis not present

## 2018-01-13 DIAGNOSIS — W19XXXA Unspecified fall, initial encounter: Secondary | ICD-10-CM | POA: Diagnosis not present

## 2018-01-13 DIAGNOSIS — Z8673 Personal history of transient ischemic attack (TIA), and cerebral infarction without residual deficits: Secondary | ICD-10-CM | POA: Diagnosis not present

## 2018-01-13 DIAGNOSIS — E119 Type 2 diabetes mellitus without complications: Secondary | ICD-10-CM | POA: Diagnosis not present

## 2018-01-13 NOTE — ED Notes (Signed)
Report to April at [facility]

## 2018-01-13 NOTE — ED Notes (Signed)
Patient is quiet and comfortable and in no distress

## 2018-01-13 NOTE — ED Notes (Signed)
PTAR notified of need for transport back to [facility]

## 2018-01-13 NOTE — ED Notes (Signed)
PTAR here to transport patient back to [facility]

## 2018-01-13 NOTE — ED Triage Notes (Signed)
Patient arrives by [facility] from [facility] after a witnessed fall. Staff states patient rolled out of the bed-staff witnessed fall. Hit head on wall-no bruising or any trauma-patient speaks only Russian-no moaning or crying out with palpation to skull.

## 2018-01-13 NOTE — ED Provider Notes (Signed)
[facility] DEPT Provider Note: Georgena Spurling, MD, FACEP  CSN: [id number] MRN: [id number] ARRIVAL: 01/13/18 at [place]: WHALD/WHALD   CHIEF COMPLAINT  Fall  Level 5 caveat: Dementia HISTORY OF PRESENT ILLNESS  01/13/18 12:46 AM Alice STYRON is a 83 y.o. female who had a witnessed fall from bed, rolling out against the wall.  She reportedly hit her head against the wall.  There was no loss of consciousness and she has not been vomiting.  EMS reports no obvious signs of trauma.  Dr. Roel Cluck assisted with [place] interpretation; the patient is unwilling or unable to provide a history due to her dementia.   Past Medical History:  Diagnosis Date  . Anxiety   . Cholelithiasis   . CVA (cerebral vascular accident) ([place])   . Depression   . Diverticulosis   . GERD (gastroesophageal reflux disease)   . Gout 2009  . H. pylori infection 2011  . Hyperlipidemia   . Hypertension   . Memory loss   . MR (mitral regurgitation)   . Osteoporosis   . S/P cardiac cath 12/20/13 12/20/2013  . Thrombocytopenia ([place])   . Thyroid cyst   . Vaso-vagal reaction   . Vitamin D deficiency     Past Surgical History:  Procedure Laterality Date  . CESAREAN SECTION    . CHOLECYSTECTOMY  2010  . LASIK    . LEFT HEART CATHETERIZATION WITH CORONARY ANGIOGRAM N/A 12/20/2013   Procedure: LEFT HEART CATHETERIZATION WITH CORONARY ANGIOGRAM;  Surgeon: Burnell Blanks, MD;  Location: [facility] CATH LAB;  Service: Cardiovascular;  Laterality: N/A;    Family History  Problem Relation Age of Onset  . Lung disease Father        Deceased  . Hypertension Other   . Stomach cancer Brother   . Ulcers Son   . Colon cancer Neg Hx     Social History   Tobacco Use  . Smoking status: Never Smoker  . Smokeless tobacco: Never Used  Substance Use Topics  . Alcohol use: No    Alcohol/week: 0.0 standard drinks  . Drug use: No    Types: Methylphenidate    Prior to Admission medications   Medication Sig  Start Date End Date Taking? Authorizing Provider  amLODipine (NORVASC) 5 MG tablet TAKE 1 TABLET BY MOUTH EVERY DAY 09/16/17   Plotnikov, Evie Lacks, MD  aspirin EC 81 MG EC tablet Take 1 tablet (81 mg total) by mouth daily. 12/20/13   Isaiah Serge, NP  Cholecalciferol (D3-50) 1.25 MG (50000 UT) capsule Take 1 capsule (50,000 Units total) by mouth every 30 (thirty) days. 11/25/17   Plotnikov, Evie Lacks, MD  DULoxetine (CYMBALTA) 60 MG capsule Take 1 capsule (60 mg total) by mouth daily. 11/06/17 11/06/18  Arfeen, Arlyce Harman, MD  LORazepam (ATIVAN) 2 MG tablet Take 1 tablet (2 mg total) by mouth every 6 (six) hours as needed for anxiety, sedation or sleep (agitation). 12/09/17   Plotnikov, Evie Lacks, MD  memantine (NAMENDA) 10 MG tablet TAKE 1 TABLET BY MOUTH TWICE A DAY 10/03/17   Plotnikov, Evie Lacks, MD  metFORMIN (GLUCOPHAGE) 500 MG tablet TAKE 1 TABLET BY MOUTH TWICE A DAY WITH MEALS 10/01/17   Plotnikov, Evie Lacks, MD  nitroGLYCERIN (NITROSTAT) 0.4 MG SL tablet DISSOLVE ONE TABLET UNDER THE TONGUE EVERY 5 MINUTES AS NEEDED FOR CHEST PAIN Patient taking differently: Place 0.4 mg under the tongue every 5 (five) minutes as needed for chest pain.  08/23/16   Plotnikov, Tyrone Apple  V, MD  OLANZapine (ZYPREXA) 10 MG tablet Take 1 tablet (10 mg total) by mouth at bedtime. 12/08/17   Patrecia Pour, NP  OLANZapine (ZYPREXA) 5 MG tablet Take 1 tablet (5 mg total) by mouth every morning. 12/08/17   Patrecia Pour, NP  traZODone (DESYREL) 100 MG tablet Take 1 tablet (100 mg total) by mouth at bedtime. 12/08/17   Patrecia Pour, NP    Allergies Aricept Reather Littler hcl]; Coreg [carvedilol]; and Morphine sulfate   REVIEW OF SYSTEMS     PHYSICAL EXAMINATION  Initial Vital Signs Blood pressure (!) 156/83, pulse 73, temperature 98.1 F (36.7 C), temperature source Oral, resp. rate 16, SpO2 96 %.  Examination General: Well-developed, well-nourished female in no acute distress; appearance consistent with age of  record HENT: normocephalic; no hematoma seen or palpated; TMs obscured by cerumen bilaterally Eyes: pupils equal, round and reactive to light; extraocular muscles grossly intact Neck: supple; nontender Heart: regular rate and rhythm Lungs: clear to auscultation bilaterally Abdomen: soft; nondistended; nontender; bowel sounds present Extremities: No deformity; no pain on passive range of motion; pulses normal; trace edema of lower legs Neurologic: Awake, alert; motor function intact in all extremities and symmetric; no facial droop Skin: Warm and dry    RESULTS  Summary of this visit's results, reviewed by myself:   EKG Interpretation  Date/Time:    Ventricular Rate:    PR Interval:    QRS Duration:   QT Interval:    QTC Calculation:   R Axis:     Text Interpretation:        Laboratory Studies: No results found for this or any previous visit (from the past 24 hour(s)). Imaging Studies: No results found.  ED COURSE and MDM  Nursing notes and initial vitals signs, including pulse oximetry, reviewed.  Vitals:   01/13/18 0044  BP: (!) 156/83  Pulse: 73  Resp: 16  Temp: 98.1 F (36.7 C)  TempSrc: Oral  SpO2: 96%   No evidence of significant injury on exam.  I do not believe any radiographic studies are indicated given the mild mechanism of injury.  PROCEDURES    ED DIAGNOSES     ICD-10-CM   1. Fall from bed, initial encounter W06.Encarnacion Chu, MD 01/13/18 [phone]

## 2018-01-16 DIAGNOSIS — Z9181 History of falling: Secondary | ICD-10-CM | POA: Diagnosis not present

## 2018-01-16 DIAGNOSIS — E119 Type 2 diabetes mellitus without complications: Secondary | ICD-10-CM | POA: Diagnosis not present

## 2018-01-16 DIAGNOSIS — F0391 Unspecified dementia with behavioral disturbance: Secondary | ICD-10-CM | POA: Diagnosis not present

## 2018-01-16 DIAGNOSIS — F22 Delusional disorders: Secondary | ICD-10-CM | POA: Diagnosis not present

## 2018-01-16 DIAGNOSIS — F419 Anxiety disorder, unspecified: Secondary | ICD-10-CM | POA: Diagnosis not present

## 2018-01-16 DIAGNOSIS — Z8673 Personal history of transient ischemic attack (TIA), and cerebral infarction without residual deficits: Secondary | ICD-10-CM | POA: Diagnosis not present

## 2018-01-19 DIAGNOSIS — Z9181 History of falling: Secondary | ICD-10-CM | POA: Diagnosis not present

## 2018-01-19 DIAGNOSIS — F22 Delusional disorders: Secondary | ICD-10-CM | POA: Diagnosis not present

## 2018-01-19 DIAGNOSIS — E119 Type 2 diabetes mellitus without complications: Secondary | ICD-10-CM | POA: Diagnosis not present

## 2018-01-19 DIAGNOSIS — F419 Anxiety disorder, unspecified: Secondary | ICD-10-CM | POA: Diagnosis not present

## 2018-01-19 DIAGNOSIS — I639 Cerebral infarction, unspecified: Secondary | ICD-10-CM | POA: Diagnosis not present

## 2018-01-19 DIAGNOSIS — Z8673 Personal history of transient ischemic attack (TIA), and cerebral infarction without residual deficits: Secondary | ICD-10-CM | POA: Diagnosis not present

## 2018-01-19 DIAGNOSIS — F0391 Unspecified dementia with behavioral disturbance: Secondary | ICD-10-CM | POA: Diagnosis not present

## 2018-01-19 DIAGNOSIS — R296 Repeated falls: Secondary | ICD-10-CM | POA: Diagnosis not present

## 2018-01-21 DIAGNOSIS — F22 Delusional disorders: Secondary | ICD-10-CM | POA: Diagnosis not present

## 2018-01-21 DIAGNOSIS — Z8673 Personal history of transient ischemic attack (TIA), and cerebral infarction without residual deficits: Secondary | ICD-10-CM | POA: Diagnosis not present

## 2018-01-21 DIAGNOSIS — F419 Anxiety disorder, unspecified: Secondary | ICD-10-CM | POA: Diagnosis not present

## 2018-01-21 DIAGNOSIS — E119 Type 2 diabetes mellitus without complications: Secondary | ICD-10-CM | POA: Diagnosis not present

## 2018-01-21 DIAGNOSIS — F0391 Unspecified dementia with behavioral disturbance: Secondary | ICD-10-CM | POA: Diagnosis not present

## 2018-01-21 DIAGNOSIS — Z9181 History of falling: Secondary | ICD-10-CM | POA: Diagnosis not present

## 2018-01-26 DIAGNOSIS — Z9181 History of falling: Secondary | ICD-10-CM | POA: Diagnosis not present

## 2018-01-26 DIAGNOSIS — F22 Delusional disorders: Secondary | ICD-10-CM | POA: Diagnosis not present

## 2018-01-26 DIAGNOSIS — Z8673 Personal history of transient ischemic attack (TIA), and cerebral infarction without residual deficits: Secondary | ICD-10-CM | POA: Diagnosis not present

## 2018-01-26 DIAGNOSIS — E119 Type 2 diabetes mellitus without complications: Secondary | ICD-10-CM | POA: Diagnosis not present

## 2018-01-26 DIAGNOSIS — F419 Anxiety disorder, unspecified: Secondary | ICD-10-CM | POA: Diagnosis not present

## 2018-01-26 DIAGNOSIS — F0391 Unspecified dementia with behavioral disturbance: Secondary | ICD-10-CM | POA: Diagnosis not present

## 2018-01-27 ENCOUNTER — Ambulatory Visit: Payer: Medicare Other | Admitting: Sports Medicine

## 2018-01-29 DIAGNOSIS — Z961 Presence of intraocular lens: Secondary | ICD-10-CM | POA: Diagnosis not present

## 2018-01-29 DIAGNOSIS — E119 Type 2 diabetes mellitus without complications: Secondary | ICD-10-CM | POA: Diagnosis not present

## 2018-02-03 DIAGNOSIS — F419 Anxiety disorder, unspecified: Secondary | ICD-10-CM | POA: Diagnosis not present

## 2018-02-03 DIAGNOSIS — Z9181 History of falling: Secondary | ICD-10-CM | POA: Diagnosis not present

## 2018-02-03 DIAGNOSIS — F22 Delusional disorders: Secondary | ICD-10-CM | POA: Diagnosis not present

## 2018-02-03 DIAGNOSIS — E119 Type 2 diabetes mellitus without complications: Secondary | ICD-10-CM | POA: Diagnosis not present

## 2018-02-03 DIAGNOSIS — Z8673 Personal history of transient ischemic attack (TIA), and cerebral infarction without residual deficits: Secondary | ICD-10-CM | POA: Diagnosis not present

## 2018-02-03 DIAGNOSIS — F0391 Unspecified dementia with behavioral disturbance: Secondary | ICD-10-CM | POA: Diagnosis not present

## 2018-02-05 ENCOUNTER — Ambulatory Visit (HOSPITAL_COMMUNITY): Payer: Medicare Other | Admitting: Psychiatry

## 2018-02-09 DIAGNOSIS — F22 Delusional disorders: Secondary | ICD-10-CM | POA: Diagnosis not present

## 2018-02-09 DIAGNOSIS — Z9181 History of falling: Secondary | ICD-10-CM | POA: Diagnosis not present

## 2018-02-09 DIAGNOSIS — F0391 Unspecified dementia with behavioral disturbance: Secondary | ICD-10-CM | POA: Diagnosis not present

## 2018-02-09 DIAGNOSIS — E119 Type 2 diabetes mellitus without complications: Secondary | ICD-10-CM | POA: Diagnosis not present

## 2018-02-09 DIAGNOSIS — Z8673 Personal history of transient ischemic attack (TIA), and cerebral infarction without residual deficits: Secondary | ICD-10-CM | POA: Diagnosis not present

## 2018-02-09 DIAGNOSIS — F419 Anxiety disorder, unspecified: Secondary | ICD-10-CM | POA: Diagnosis not present

## 2018-02-14 ENCOUNTER — Encounter (HOSPITAL_COMMUNITY): Payer: Self-pay | Admitting: Psychiatry

## 2018-02-14 ENCOUNTER — Ambulatory Visit (INDEPENDENT_AMBULATORY_CARE_PROVIDER_SITE_OTHER): Payer: Medicare Other | Admitting: Psychiatry

## 2018-02-14 VITALS — BP 132/78 | Ht 62.0 in | Wt 155.0 lb

## 2018-02-14 DIAGNOSIS — F0630 Mood disorder due to known physiological condition, unspecified: Secondary | ICD-10-CM

## 2018-02-14 DIAGNOSIS — F29 Unspecified psychosis not due to a substance or known physiological condition: Secondary | ICD-10-CM

## 2018-02-14 DIAGNOSIS — F0391 Unspecified dementia with behavioral disturbance: Secondary | ICD-10-CM | POA: Diagnosis not present

## 2018-02-14 MED ORDER — OLANZAPINE 10 MG PO TABS: 10.0000 mg | ORAL_TABLET | Freq: Every day | ORAL | 0 refills | Status: DC

## 2018-02-14 MED ORDER — DULOXETINE HCL 60 MG PO CPEP: 60.0000 mg | ORAL_CAPSULE | Freq: Every day | ORAL | 0 refills | Status: DC

## 2018-02-14 MED ORDER — TRAZODONE HCL 100 MG PO TABS: 100.0000 mg | ORAL_TABLET | Freq: Every day | ORAL | 0 refills | Status: DC

## 2018-02-14 NOTE — Progress Notes (Signed)
BH MD/PA/NP OP Progress Note  02/14/2018 12:55 PM WAVE CALZADA  MRN:  [id number]  Chief Complaint: I do not know why I am here.  HPI: Patient came for her follow-up appointment.  She speaks [place] and her daughter translate for her.  Patient was seen in the emergency room few months ago due to agitation, having a psychotic episode.  She was walking on the main road and waving people.  Cars were swerved to avoid hitting her.  Please brought to the emergency room and she get more upset and wanted to kill and slash her throat.  Patient was seen by psychiatry consultation liaison and her dose was increased.  She is not taking Zyprexa 15 mg at bedtime.  She was discharged but she was again seen in December when she fell from the bed.  Her daughter was concerned about her safety as she cannot watch 24/7.  Few weeks ago patient moved to assisted living facility and since then her agitation, anger and psychosis is somewhat better.  She is giving the medication on time and there has been no complain about running away and aggression.  She is actually hugging people.  I noticed her memory is declined significantly in past few months.  She even did not recognize me.  She is on trazodone 100 mg at bedtime, Cymbalta 60 mg daily and Zyprexa 50 mg at bedtime.  Patient did not provide much information and most of the information was obtained through electronic medical record and her daughter.  She appears some time laughing and giggling.  Most of the time she was quite and difficult to engage in conversation.  As per daughter she is eating and sleeping well.  She is tolerating medication and she has no tremors or shakes.    Visit Diagnosis:    ICD-10-CM   1. Psychosis, unspecified psychosis type ([place]) F29 OLANZapine (ZYPREXA) 10 MG tablet    traZODone (DESYREL) 100 MG tablet  2. Mood disorder in conditions classified elsewhere F06.30 traZODone (DESYREL) 100 MG tablet    DULoxetine (CYMBALTA) 60 MG capsule  3.  Dementia with behavioral disturbance, unspecified dementia type ([facility]) F03.91 OLANZapine (ZYPREXA) 10 MG tablet    Past Psychiatric History: Reviewed. Took Prozac from PCP for depression and anxiety symptoms. Do not recall any inpatient psychiatric treatment or any suicidal attempt. H/O irritability, anger and mood swings. We tried Depakote 500 mg and Latuda with poor response. Good response with Zyprexa.  Seen in the emergency room for psych consult due to agitation and Zyprexa dose increase.  Past Medical History:  Past Medical History:  Diagnosis Date  . Anxiety   . Cholelithiasis   . CVA (cerebral vascular accident) ([place])   . Depression   . Diverticulosis   . GERD (gastroesophageal reflux disease)   . Gout 2009  . H. pylori infection 2011  . Hyperlipidemia   . Hypertension   . Memory loss   . MR (mitral regurgitation)   . Osteoporosis   . S/P cardiac cath 12/20/13 12/20/2013  . Thrombocytopenia ([place])   . Thyroid cyst   . Vaso-vagal reaction   . Vitamin D deficiency     Past Surgical History:  Procedure Laterality Date  . CESAREAN SECTION    . CHOLECYSTECTOMY  2010  . LASIK    . LEFT HEART CATHETERIZATION WITH CORONARY ANGIOGRAM N/A 12/20/2013   Procedure: LEFT HEART CATHETERIZATION WITH CORONARY ANGIOGRAM;  Surgeon: Burnell Blanks, MD;  Location: [facility] CATH LAB;  Service: Cardiovascular;  Laterality: N/A;    Family Psychiatric History: Reviewed.  Family History:  Family History  Problem Relation Age of Onset  . Lung disease Father        Deceased  . Hypertension Other   . Stomach cancer Brother   . Ulcers Son   . Colon cancer Neg Hx     Social History:  Social History   Socioeconomic History  . Marital status: Widowed    Spouse name: Not on file  . Number of children: 3  . Years of education: Not on file  . Highest education level: Not on file  Occupational History  . Occupation: RETIRED  Social Needs  . Financial resource strain: Not on file  .  Food insecurity:    Worry: Not on file    Inability: Not on file  . Transportation needs:    Medical: Not on file    Non-medical: Not on file  Tobacco Use  . Smoking status: Never Smoker  . Smokeless tobacco: Never Used  Substance and Sexual Activity  . Alcohol use: No    Alcohol/week: 0.0 standard drinks  . Drug use: No    Types: Methylphenidate  . Sexual activity: Never  Lifestyle  . Physical activity:    Days per week: Not on file    Minutes per session: Not on file  . Stress: Not on file  Relationships  . Social connections:    Talks on phone: Not on file    Gets together: Not on file    Attends religious service: Not on file    Active member of club or organization: Not on file    Attends meetings of clubs or organizations: Not on file    Relationship status: Not on file  Other Topics Concern  . Not on file  Social History Narrative   Family History Hypertension   No caffeine    Lives with husband in a 2 story home.  Retired.    Moved from [place] in 2002    Allergies:  Allergies  Allergen Reactions  . Aricept [Donepezil Hcl]     ?dry tongue  . Coreg [Carvedilol]     ?problems  . Morphine Sulfate Other (See Comments)    REACTION: decreased blood pressure    Metabolic Disorder Labs: Recent Results (from the past 2160 hour(s))  Comprehensive metabolic panel     Status: Abnormal   Collection Time: 12/01/17  4:01 PM  Result Value Ref Range   Sodium 142 135 - 145 mmol/L   Potassium 3.7 3.5 - 5.1 mmol/L   Chloride 105 98 - 111 mmol/L   CO2 28 22 - 32 mmol/L   Glucose, Bld 180 (H) 70 - 99 mg/dL   BUN 28 (H) 8 - 23 mg/dL   Creatinine, Ser 0.95 0.44 - 1.00 mg/dL   Calcium 9.2 8.9 - 10.3 mg/dL   Total Protein 7.0 6.5 - 8.1 g/dL   Albumin 4.3 3.5 - 5.0 g/dL   AST 19 15 - 41 U/L   ALT 17 0 - 44 U/L   Alkaline Phosphatase 69 38 - 126 U/L   Total Bilirubin 0.7 0.3 - 1.2 mg/dL   GFR calc non Af Amer 55 (L) >60 mL/min   GFR calc Af Amer >60 >60 mL/min     Comment: (NOTE) The eGFR has been calculated using the CKD EPI equation. This calculation has not been validated in all clinical situations. eGFR's persistently <60 mL/min signify possible Chronic Kidney Disease.    Anion  gap 9 5 - 15    Comment: Performed at [facility], [place] [address], [place], [place] [address]  Ethanol     Status: None   Collection Time: 12/01/17  4:01 PM  Result Value Ref Range   Alcohol, Ethyl (B) <10 <10 mg/dL    Comment: (NOTE) Lowest detectable limit for serum alcohol is 10 mg/dL. For medical purposes only. Performed at [facility], [place] [address] [place], [place] [address]   Salicylate level     Status: None   Collection Time: 12/01/17  4:01 PM  Result Value Ref Range   Salicylate Lvl <4.2 2.8 - 30.0 mg/dL    Comment: Performed at [facility], [place] [address], [place], [place] [address]  Acetaminophen level     Status: Abnormal   Collection Time: 12/01/17  4:01 PM  Result Value Ref Range   Acetaminophen (Tylenol), Serum <10 (L) 10 - 30 ug/mL    Comment: (NOTE) Therapeutic concentrations vary significantly. A range of 10-30 ug/mL  may be an effective concentration for many patients. However, some  are best treated at concentrations outside of this range. Acetaminophen concentrations >150 ug/mL at 4 hours after ingestion  and >50 ug/mL at 12 hours after ingestion are often associated with  toxic reactions. Performed at [facility], [place] [address] [place], [place] [address]   cbc     Status: None   Collection Time: 12/01/17  4:01 PM  Result Value Ref Range   WBC 6.8 4.0 - 10.5 K/uL   RBC 4.24 3.87 - 5.11 MIL/uL   Hemoglobin 13.3 12.0 - 15.0 g/dL   HCT 39.8 36.0 - 46.0 %   MCV 93.9 80.0 - 100.0 fL   MCH 31.4 26.0 - 34.0 pg   MCHC 33.4 30.0 - 36.0 g/dL   RDW 12.2 11.5 - 15.5 %   Platelets 252 150 - 400 K/uL   nRBC 0.0 0.0 - 0.2 %    Comment: Performed at [facility], [place] [address] [place], [place] [address]  Rapid urine drug screen (hospital performed)     Status: Abnormal   Collection Time: 12/01/17  4:51 PM  Result Value Ref Range   Opiates NONE DETECTED NONE DETECTED   Cocaine NONE DETECTED NONE DETECTED   Benzodiazepines POSITIVE (A) NONE DETECTED   Amphetamines NONE DETECTED NONE DETECTED   Tetrahydrocannabinol NONE DETECTED NONE DETECTED   Barbiturates NONE DETECTED NONE DETECTED    Comment: (NOTE) DRUG SCREEN FOR MEDICAL PURPOSES ONLY.  IF CONFIRMATION IS NEEDED FOR ANY PURPOSE, NOTIFY LAB WITHIN 5 DAYS. LOWEST DETECTABLE LIMITS FOR URINE DRUG SCREEN Drug Class                     Cutoff (ng/mL) Amphetamine and metabolites    1000 Barbiturate and metabolites    200 Benzodiazepine                 211 Tricyclics and metabolites     300 Opiates and metabolites        300 Cocaine and metabolites        300 THC                            50 Performed at [facility], [place] [address], [place],  [address]   Urinalysis, Routine w reflex microscopic     Status: Abnormal   Collection  Time: 12/01/17  4:51 PM  Result Value Ref Range   Color, Urine YELLOW YELLOW   APPearance CLEAR CLEAR   Specific Gravity, Urine 1.015 1.005 - 1.030   pH 7.0 5.0 - 8.0   Glucose, UA NEGATIVE NEGATIVE mg/dL   Hgb urine dipstick NEGATIVE NEGATIVE   Bilirubin Urine NEGATIVE NEGATIVE   Ketones, ur 5 (A) NEGATIVE mg/dL   Protein, ur NEGATIVE NEGATIVE mg/dL   Nitrite NEGATIVE NEGATIVE   Leukocytes, UA TRACE (A) NEGATIVE   RBC / HPF 0-5 0 - 5 RBC/hpf   WBC, UA 0-5 0 - 5 WBC/hpf   Bacteria, UA NONE SEEN NONE SEEN    Comment: Performed at [place] [address], [place], [place] [address]  CBG monitoring, ED     Status: Abnormal   Collection Time: 12/04/17  9:10 PM  Result Value Ref Range   Glucose-Capillary 148 (H) 70 - 99 mg/dL  CBG monitoring, ED     Status: Abnormal   Collection Time:  12/08/17  8:45 AM  Result Value Ref Range   Glucose-Capillary 145 (H) 70 - 99 mg/dL   Comment 1 Notify RN   QuantiFERON-TB Gold Plus     Status: None   Collection Time: 12/30/17 12:18 PM  Result Value Ref Range   QuantiFERON-TB Gold Plus NEGATIVE NEGATIVE    Comment: Negative test result. M. tuberculosis complex  infection unlikely.    NIL 0.04 IU/mL   Mitogen-NIL 8.38 IU/mL   TB1-NIL 0.15 IU/mL   TB2-NIL 0.16 IU/mL    Comment: . The Nil tube value reflects the background interferon gamma immune response of the patient's blood sample. This value has been subtracted from the patient's displayed TB and Mitogen results. . Lower than expected results with the Mitogen tube prevent false-negative Quantiferon readings by detecting a patient with a potential immune suppressive condition and/or suboptimal pre-analytical specimen handling. . The TB1 Antigen tube is coated with the M. tuberculosis-specific antigens designed to elicit responses from TB antigen primed CD4+ helper T-lymphocytes. . The TB2 Antigen tube is coated with the M. tuberculosis-specific antigens designed to elicit responses from TB antigen primed CD4+ helper and CD8+ cytotoxic T-lymphocytes. . For additional information, please refer to https://education.questdiagnostics.com/faq/FAQ204 (This link is being provided for informational/ educational purposes only.) .    Lab Results  Component Value Date   HGBA1C 5.8 08/15/2017   No results found for: PROLACTIN Lab Results  Component Value Date   CHOL 166 08/23/2011   TRIG 115.0 08/23/2011   HDL 36.90 (L) 08/23/2011   CHOLHDL 4 08/23/2011   VLDL 23.0 08/23/2011   LDLCALC 106 (H) 08/23/2011   LDLCALC 109 (H) 01/02/2010   Lab Results  Component Value Date   TSH 1.82 08/15/2017   TSH 4.18 03/14/2017    Therapeutic Level Labs: No results found for: LITHIUM No results found for: VALPROATE No components found for:  CBMZ  Current  Medications: Current Outpatient Medications  Medication Sig Dispense Refill  . amLODipine (NORVASC) 5 MG tablet TAKE 1 TABLET BY MOUTH EVERY DAY 90 tablet 3  . aspirin EC 81 MG EC tablet Take 1 tablet (81 mg total) by mouth daily.    . Cholecalciferol (D3-50) 1.25 MG (50000 UT) capsule Take 1 capsule (50,000 Units total) by mouth every 30 (thirty) days. 3 capsule 3  . DULoxetine (CYMBALTA) 60 MG capsule Take 1 capsule (60 mg total) by mouth daily. 90 capsule 0  . LORazepam (ATIVAN) 2 MG tablet Take 1 tablet (2  mg total) by mouth every 6 (six) hours as needed for anxiety, sedation or sleep (agitation). 80 tablet 1  . memantine (NAMENDA) 10 MG tablet TAKE 1 TABLET BY MOUTH TWICE A DAY 180 tablet 2  . metFORMIN (GLUCOPHAGE) 500 MG tablet TAKE 1 TABLET BY MOUTH TWICE A DAY WITH MEALS 180 tablet 1  . nitroGLYCERIN (NITROSTAT) 0.4 MG SL tablet DISSOLVE ONE TABLET UNDER THE TONGUE EVERY 5 MINUTES AS NEEDED FOR CHEST PAIN (Patient taking differently: Place 0.4 mg under the tongue every 5 (five) minutes as needed for chest pain. ) 25 tablet 0  . OLANZapine (ZYPREXA) 10 MG tablet Take 1 tablet (10 mg total) by mouth at bedtime. 90 tablet 0  . traZODone (DESYREL) 100 MG tablet Take 1 tablet (100 mg total) by mouth at bedtime. 90 tablet 0   No current facility-administered medications for this visit.      Musculoskeletal: Strength & Muscle Tone: within normal limits Gait & Station: normal, Walks slow Patient leans: N/A  Psychiatric Specialty Exam: Review of Systems  Constitutional: Positive for weight loss.  Skin: Negative.   Psychiatric/Behavioral: Positive for memory loss.    Blood pressure 132/78, height '5\' 2"'$  (1.575 m), weight 155 lb (70.3 kg).Body mass index is 28.35 kg/m.  General Appearance: Fairly Groomed and Superficially cooperative.  Had a language barrier.  As patient speaks only [place].  Eye Contact:  Fair  Speech:  Slow  Volume:  Decreased  Mood:  Euthymic  Affect:  Constricted  and Flat  Thought Process:  Disorganized  Orientation:  Other:  Alert and oriented x1.  Thought Content: Confused due to memory impairment.  No paranoia.   Suicidal Thoughts:  No  Homicidal Thoughts:  No  Memory:  Immediate;   Poor Recent;   Poor Remote;   Poor  Judgement:  Impaired  Insight:  Lacking  Psychomotor Activity:  Decreased  Concentration:  Concentration: Poor and Attention Span: Poor  Recall:  Poor  Fund of Knowledge: Poor  Language: Speak 1 or 2 word.  Language barrier.  Akathisia:  No  Handed:  Right  AIMS (if indicated): not done  Assets:  Housing  ADL's:  Impaired  Cognition: Impaired,  Moderate  Sleep:  Good   Screenings: PHQ2-9     Office Visit from 02/21/2017 in [place] Office Visit from 05/19/2015 in [place] Visit from 08/23/2014 in Primary Care at [facility] Total Score  2  6  0  PHQ-9 Total Score  12  11  -       Assessment and Plan: Beautiful is a 84 year old [place] female with history of aggression, paranoia, anxiety, leaving her place and making her life and someone else life in danger now in assisted living facility.  As per daughter she is doing better and there has been no recent agitation or running away.  Daughter told that patient sometime hugs staff member but no behavior problem.  She is concerned about higher dose of Zyprexa as some time she sleeps all day.  I review records from emergency room, psychiatry consultation liaison, blood work and current medication.  I recommended to try previous dose of Zyprexa 10 mg at bedtime as she is sleeping too much during the day and now she is in a structural environment and getting the medication on time.  Daughter agree with the plan.  I will continue Cymbalta 60 mg daily, and lower the Zyprexa 10 mg at bedtime.  I  will see her again in 2 months if patient continues to have symptoms of agitation and aggression and violence then we can try  increasing the dose of Zyprexa.  We discussed medication side effects and benefits.  Treatment plan discussed with the patient daughter who agreed. Time spent 30 minutes.  More than 50% of the time spent in psychoeducation, counseling and coordination of care.  Discuss safety plan that anytime having active suicidal thoughts or homicidal thoughts then patient need to call 911 or go to the local emergency room.     Kathlee Nations, MD 02/14/2018, 12:55 PM

## 2018-02-16 DIAGNOSIS — F0391 Unspecified dementia with behavioral disturbance: Secondary | ICD-10-CM | POA: Diagnosis not present

## 2018-02-16 DIAGNOSIS — F419 Anxiety disorder, unspecified: Secondary | ICD-10-CM | POA: Diagnosis not present

## 2018-02-16 DIAGNOSIS — I639 Cerebral infarction, unspecified: Secondary | ICD-10-CM | POA: Diagnosis not present

## 2018-02-16 DIAGNOSIS — R159 Full incontinence of feces: Secondary | ICD-10-CM | POA: Diagnosis not present

## 2018-02-17 DIAGNOSIS — Z8673 Personal history of transient ischemic attack (TIA), and cerebral infarction without residual deficits: Secondary | ICD-10-CM | POA: Diagnosis not present

## 2018-02-17 DIAGNOSIS — F0391 Unspecified dementia with behavioral disturbance: Secondary | ICD-10-CM | POA: Diagnosis not present

## 2018-02-17 DIAGNOSIS — Z9181 History of falling: Secondary | ICD-10-CM | POA: Diagnosis not present

## 2018-02-17 DIAGNOSIS — F419 Anxiety disorder, unspecified: Secondary | ICD-10-CM | POA: Diagnosis not present

## 2018-02-17 DIAGNOSIS — F22 Delusional disorders: Secondary | ICD-10-CM | POA: Diagnosis not present

## 2018-02-17 DIAGNOSIS — E119 Type 2 diabetes mellitus without complications: Secondary | ICD-10-CM | POA: Diagnosis not present

## 2018-02-20 DIAGNOSIS — F0391 Unspecified dementia with behavioral disturbance: Secondary | ICD-10-CM | POA: Diagnosis not present

## 2018-02-20 DIAGNOSIS — F22 Delusional disorders: Secondary | ICD-10-CM | POA: Diagnosis not present

## 2018-02-20 DIAGNOSIS — E119 Type 2 diabetes mellitus without complications: Secondary | ICD-10-CM | POA: Diagnosis not present

## 2018-02-20 DIAGNOSIS — Z9181 History of falling: Secondary | ICD-10-CM | POA: Diagnosis not present

## 2018-02-20 DIAGNOSIS — Z8673 Personal history of transient ischemic attack (TIA), and cerebral infarction without residual deficits: Secondary | ICD-10-CM | POA: Diagnosis not present

## 2018-02-20 DIAGNOSIS — F419 Anxiety disorder, unspecified: Secondary | ICD-10-CM | POA: Diagnosis not present

## 2018-02-24 DIAGNOSIS — Z9181 History of falling: Secondary | ICD-10-CM | POA: Diagnosis not present

## 2018-02-24 DIAGNOSIS — E119 Type 2 diabetes mellitus without complications: Secondary | ICD-10-CM | POA: Diagnosis not present

## 2018-02-24 DIAGNOSIS — F22 Delusional disorders: Secondary | ICD-10-CM | POA: Diagnosis not present

## 2018-02-24 DIAGNOSIS — Z8673 Personal history of transient ischemic attack (TIA), and cerebral infarction without residual deficits: Secondary | ICD-10-CM | POA: Diagnosis not present

## 2018-02-24 DIAGNOSIS — F419 Anxiety disorder, unspecified: Secondary | ICD-10-CM | POA: Diagnosis not present

## 2018-02-24 DIAGNOSIS — F0391 Unspecified dementia with behavioral disturbance: Secondary | ICD-10-CM | POA: Diagnosis not present

## 2018-02-26 DIAGNOSIS — E119 Type 2 diabetes mellitus without complications: Secondary | ICD-10-CM | POA: Diagnosis not present

## 2018-02-26 DIAGNOSIS — F419 Anxiety disorder, unspecified: Secondary | ICD-10-CM | POA: Diagnosis not present

## 2018-02-26 DIAGNOSIS — Z9181 History of falling: Secondary | ICD-10-CM | POA: Diagnosis not present

## 2018-02-26 DIAGNOSIS — F22 Delusional disorders: Secondary | ICD-10-CM | POA: Diagnosis not present

## 2018-02-26 DIAGNOSIS — F0391 Unspecified dementia with behavioral disturbance: Secondary | ICD-10-CM | POA: Diagnosis not present

## 2018-02-26 DIAGNOSIS — Z8673 Personal history of transient ischemic attack (TIA), and cerebral infarction without residual deficits: Secondary | ICD-10-CM | POA: Diagnosis not present

## 2018-03-02 DIAGNOSIS — Z87440 Personal history of urinary (tract) infections: Secondary | ICD-10-CM | POA: Diagnosis not present

## 2018-03-02 DIAGNOSIS — Z2009 Contact with and (suspected) exposure to other intestinal infectious diseases: Secondary | ICD-10-CM | POA: Diagnosis not present

## 2018-03-02 DIAGNOSIS — R4181 Age-related cognitive decline: Secondary | ICD-10-CM | POA: Diagnosis not present

## 2018-03-02 DIAGNOSIS — I639 Cerebral infarction, unspecified: Secondary | ICD-10-CM | POA: Diagnosis not present

## 2018-03-02 DIAGNOSIS — F419 Anxiety disorder, unspecified: Secondary | ICD-10-CM | POA: Diagnosis not present

## 2018-03-03 DIAGNOSIS — F0391 Unspecified dementia with behavioral disturbance: Secondary | ICD-10-CM | POA: Diagnosis not present

## 2018-03-03 DIAGNOSIS — Z9181 History of falling: Secondary | ICD-10-CM | POA: Diagnosis not present

## 2018-03-03 DIAGNOSIS — Z8673 Personal history of transient ischemic attack (TIA), and cerebral infarction without residual deficits: Secondary | ICD-10-CM | POA: Diagnosis not present

## 2018-03-03 DIAGNOSIS — F22 Delusional disorders: Secondary | ICD-10-CM | POA: Diagnosis not present

## 2018-03-03 DIAGNOSIS — F419 Anxiety disorder, unspecified: Secondary | ICD-10-CM | POA: Diagnosis not present

## 2018-03-03 DIAGNOSIS — E119 Type 2 diabetes mellitus without complications: Secondary | ICD-10-CM | POA: Diagnosis not present

## 2018-03-04 DIAGNOSIS — R319 Hematuria, unspecified: Secondary | ICD-10-CM | POA: Diagnosis not present

## 2018-03-04 DIAGNOSIS — N390 Urinary tract infection, site not specified: Secondary | ICD-10-CM | POA: Diagnosis not present

## 2018-03-04 DIAGNOSIS — R4182 Altered mental status, unspecified: Secondary | ICD-10-CM | POA: Diagnosis not present

## 2018-03-05 DIAGNOSIS — E119 Type 2 diabetes mellitus without complications: Secondary | ICD-10-CM | POA: Diagnosis not present

## 2018-03-05 DIAGNOSIS — F419 Anxiety disorder, unspecified: Secondary | ICD-10-CM | POA: Diagnosis not present

## 2018-03-05 DIAGNOSIS — F0391 Unspecified dementia with behavioral disturbance: Secondary | ICD-10-CM | POA: Diagnosis not present

## 2018-03-05 DIAGNOSIS — F22 Delusional disorders: Secondary | ICD-10-CM | POA: Diagnosis not present

## 2018-03-05 DIAGNOSIS — Z9181 History of falling: Secondary | ICD-10-CM | POA: Diagnosis not present

## 2018-03-05 DIAGNOSIS — Z8673 Personal history of transient ischemic attack (TIA), and cerebral infarction without residual deficits: Secondary | ICD-10-CM | POA: Diagnosis not present

## 2018-03-09 DIAGNOSIS — M542 Cervicalgia: Secondary | ICD-10-CM | POA: Diagnosis not present

## 2018-03-09 DIAGNOSIS — F419 Anxiety disorder, unspecified: Secondary | ICD-10-CM | POA: Diagnosis not present

## 2018-03-09 DIAGNOSIS — I639 Cerebral infarction, unspecified: Secondary | ICD-10-CM | POA: Diagnosis not present

## 2018-03-09 DIAGNOSIS — F0391 Unspecified dementia with behavioral disturbance: Secondary | ICD-10-CM | POA: Diagnosis not present

## 2018-03-10 DIAGNOSIS — F419 Anxiety disorder, unspecified: Secondary | ICD-10-CM | POA: Diagnosis not present

## 2018-03-10 DIAGNOSIS — F331 Major depressive disorder, recurrent, moderate: Secondary | ICD-10-CM | POA: Diagnosis not present

## 2018-03-10 DIAGNOSIS — F0281 Dementia in other diseases classified elsewhere with behavioral disturbance: Secondary | ICD-10-CM | POA: Diagnosis not present

## 2018-03-11 ENCOUNTER — Emergency Department (HOSPITAL_COMMUNITY)
Admission: EM | Admit: 2018-03-11 | Discharge: 2018-03-11 | Disposition: A | Discharge status: Home / Self Care | Payer: Medicare Other | Attending: Emergency Medicine | Admitting: Emergency Medicine

## 2018-03-11 ENCOUNTER — Emergency Department (HOSPITAL_COMMUNITY): Payer: Medicare Other

## 2018-03-11 ENCOUNTER — Other Ambulatory Visit: Payer: Self-pay

## 2018-03-11 DIAGNOSIS — Z79899 Other long term (current) drug therapy: Secondary | ICD-10-CM | POA: Insufficient documentation

## 2018-03-11 DIAGNOSIS — F0390 Unspecified dementia without behavioral disturbance: Secondary | ICD-10-CM | POA: Insufficient documentation

## 2018-03-11 DIAGNOSIS — Z7982 Long term (current) use of aspirin: Secondary | ICD-10-CM | POA: Insufficient documentation

## 2018-03-11 DIAGNOSIS — S0990XA Unspecified injury of head, initial encounter: Secondary | ICD-10-CM | POA: Diagnosis not present

## 2018-03-11 DIAGNOSIS — F329 Major depressive disorder, single episode, unspecified: Secondary | ICD-10-CM | POA: Diagnosis not present

## 2018-03-11 DIAGNOSIS — Z7984 Long term (current) use of oral hypoglycemic drugs: Secondary | ICD-10-CM | POA: Diagnosis not present

## 2018-03-11 DIAGNOSIS — Z8673 Personal history of transient ischemic attack (TIA), and cerebral infarction without residual deficits: Secondary | ICD-10-CM | POA: Diagnosis not present

## 2018-03-11 DIAGNOSIS — Z9049 Acquired absence of other specified parts of digestive tract: Secondary | ICD-10-CM | POA: Insufficient documentation

## 2018-03-11 DIAGNOSIS — Y92129 Unspecified place in nursing home as the place of occurrence of the external cause: Secondary | ICD-10-CM | POA: Insufficient documentation

## 2018-03-11 DIAGNOSIS — W19XXXA Unspecified fall, initial encounter: Secondary | ICD-10-CM | POA: Diagnosis not present

## 2018-03-11 DIAGNOSIS — E119 Type 2 diabetes mellitus without complications: Secondary | ICD-10-CM | POA: Insufficient documentation

## 2018-03-11 DIAGNOSIS — I491 Atrial premature depolarization: Secondary | ICD-10-CM | POA: Diagnosis not present

## 2018-03-11 DIAGNOSIS — W07XXXA Fall from chair, initial encounter: Secondary | ICD-10-CM | POA: Insufficient documentation

## 2018-03-11 DIAGNOSIS — S0083XA Contusion of other part of head, initial encounter: Secondary | ICD-10-CM | POA: Insufficient documentation

## 2018-03-11 DIAGNOSIS — Y998 Other external cause status: Secondary | ICD-10-CM | POA: Diagnosis not present

## 2018-03-11 DIAGNOSIS — Y9389 Activity, other specified: Secondary | ICD-10-CM | POA: Diagnosis not present

## 2018-03-11 DIAGNOSIS — S199XXA Unspecified injury of neck, initial encounter: Secondary | ICD-10-CM | POA: Diagnosis not present

## 2018-03-11 DIAGNOSIS — F419 Anxiety disorder, unspecified: Secondary | ICD-10-CM | POA: Diagnosis not present

## 2018-03-11 DIAGNOSIS — I10 Essential (primary) hypertension: Secondary | ICD-10-CM | POA: Diagnosis not present

## 2018-03-11 DIAGNOSIS — I959 Hypotension, unspecified: Secondary | ICD-10-CM | POA: Diagnosis not present

## 2018-03-11 DIAGNOSIS — R404 Transient alteration of awareness: Secondary | ICD-10-CM | POA: Diagnosis not present

## 2018-03-11 LAB — CBG MONITORING, ED: Glucose-Capillary: 112 mg/dL — ABNORMAL HIGH (ref 70–99)

## 2018-03-11 NOTE — ED Notes (Signed)
Bed: VK12 Expected date:  Expected time:  Means of arrival:  Comments: EMS- 84yo F, fell out of wheelchair/head abrasion/knee tenderness

## 2018-03-11 NOTE — ED Notes (Signed)
Pt incontinent of urine at this time. Pt was changed and purewick in place. Pt non-verbal at this time speaking [place].

## 2018-03-11 NOTE — ED Notes (Signed)
[facility] called for transport.  

## 2018-03-11 NOTE — ED Provider Notes (Signed)
[facility] DEPT Provider Note   CSN: [id number] Arrival date & time: 03/11/18  0623    History   Chief Complaint Chief Complaint  Patient presents with  . Fall    HPI Alice Wong is a 84 y.o. female.     Patient is from nursing home.  She fell out of the chair and hit her head.  No loss of consciousness.  Patient only speaks [place] and is demented  The history is provided by the nursing home. No language interpreter was used.  Fall  This is a new problem. The current episode started 3 to 5 hours ago. The problem occurs rarely. The problem has been resolved. Pertinent negatives include no chest pain. Nothing aggravates the symptoms. Nothing relieves the symptoms. She has tried nothing for the symptoms. The treatment provided no relief.    Past Medical History:  Diagnosis Date  . Anxiety   . Cholelithiasis   . CVA (cerebral vascular accident) ([place])   . Depression   . Diverticulosis   . GERD (gastroesophageal reflux disease)   . Gout 2009  . H. pylori infection 2011  . Hyperlipidemia   . Hypertension   . Memory loss   . MR (mitral regurgitation)   . Osteoporosis   . S/P cardiac cath 12/20/13 12/20/2013  . Thrombocytopenia ([place])   . Thyroid cyst   . Vaso-vagal reaction   . Vitamin D deficiency     Patient Active Problem List   Diagnosis Date Noted  . Agitation 12/09/2017  . Paranoia ([place])   . Incontinence of bowel 08/15/2017  . Chest wall contusion 02/21/2017  . Diarrhea 09/22/2015  . Redness of eye, right 02/15/2015  . Noncompliance with medication regimen 11/15/2014  . Dementia with behavioral disturbance ([place]) 07/28/2014  . Cerebral infarction ([place]) 07/21/2014  . Sternum pain 04/21/2014  . Costochondritis 04/13/2014  . Simple chronic bronchitis ([place]) 04/13/2014  . Cough 03/22/2014  . S/P cardiac cath 12/20/13, non obsteructive disease 12/20/2013  . Abnormal nuclear stress test, false positive per cath   . Vasovagal  syncope 12/17/2013  . Midsternal chest pain 12/17/2013  . Neoplasm of uncertain behavior of skin 10/27/2013  . Uncontrolled type 2 diabetes with renal manifestation ([place]) 11/04/2012  . Pain of right thumb 12/20/2011  . Psychosomatic disorder 08/23/2011  . Tinnitus 06/04/2011  . Headache, post-traumatic, chronic 05/01/2011  . LBP (low back pain) 01/30/2011  . Insomnia 11/19/2010  . Actinic keratoses 05/23/2010  . Dizziness 04/09/2010  . Bruising 04/09/2010  . Thrombocytopenia ([place]) 01/02/2010  . HEMORRHOIDS 12/12/2009  . HELICOBACTER PYLORI INFECTION 03/17/2009  . PARESTHESIA 03/17/2009  . Chest pain, unspecified 08/01/2008  . Vitamin D deficiency 03/03/2008  . GAIT IMBALANCE 10/22/2007  . Gout, unspecified 05/07/2007  . FOOT PAIN 05/07/2007  . Dyslipidemia 02/05/2007  . Anxiety state 02/05/2007  . Depression with anxiety 02/05/2007  . Essential hypertension 02/05/2007  . GERD 02/05/2007  . OSTEOPOROSIS 02/05/2007  . Memory loss 02/05/2007  . History of cardiovascular disorder 02/05/2007    Past Surgical History:  Procedure Laterality Date  . CESAREAN SECTION    . CHOLECYSTECTOMY  2010  . LASIK    . LEFT HEART CATHETERIZATION WITH CORONARY ANGIOGRAM N/A 12/20/2013   Procedure: LEFT HEART CATHETERIZATION WITH CORONARY ANGIOGRAM;  Surgeon: Burnell Blanks, MD;  Location: [facility] CATH LAB;  Service: Cardiovascular;  Laterality: N/A;     OB History   No obstetric history on file.      Home Medications  Prior to Admission medications   Medication Sig Start Date End Date Taking? Authorizing Provider  amLODipine (NORVASC) 5 MG tablet TAKE 1 TABLET BY MOUTH EVERY DAY 09/16/17   Plotnikov, Evie Lacks, MD  aspirin EC 81 MG EC tablet Take 1 tablet (81 mg total) by mouth daily. 12/20/13   Isaiah Serge, NP  Cholecalciferol (D3-50) 1.25 MG (50000 UT) capsule Take 1 capsule (50,000 Units total) by mouth every 30 (thirty) days. 11/25/17   Plotnikov, Evie Lacks, MD  DULoxetine  (CYMBALTA) 60 MG capsule Take 1 capsule (60 mg total) by mouth daily. 02/14/18 02/14/19  Arfeen, Arlyce Harman, MD  LORazepam (ATIVAN) 2 MG tablet Take 1 tablet (2 mg total) by mouth every 6 (six) hours as needed for anxiety, sedation or sleep (agitation). 12/09/17   Plotnikov, Evie Lacks, MD  memantine (NAMENDA) 10 MG tablet TAKE 1 TABLET BY MOUTH TWICE A DAY 10/03/17   Plotnikov, Evie Lacks, MD  metFORMIN (GLUCOPHAGE) 500 MG tablet TAKE 1 TABLET BY MOUTH TWICE A DAY WITH MEALS 10/01/17   Plotnikov, Evie Lacks, MD  nitroGLYCERIN (NITROSTAT) 0.4 MG SL tablet DISSOLVE ONE TABLET UNDER THE TONGUE EVERY 5 MINUTES AS NEEDED FOR CHEST PAIN Patient taking differently: Place 0.4 mg under the tongue every 5 (five) minutes as needed for chest pain.  08/23/16   Plotnikov, Evie Lacks, MD  OLANZapine (ZYPREXA) 10 MG tablet Take 1 tablet (10 mg total) by mouth at bedtime. 02/14/18   Arfeen, Arlyce Harman, MD  traZODone (DESYREL) 100 MG tablet Take 1 tablet (100 mg total) by mouth at bedtime. 02/14/18   Arfeen, Arlyce Harman, MD    Family History Family History  Problem Relation Age of Onset  . Lung disease Father        Deceased  . Hypertension Other   . Stomach cancer Brother   . Ulcers Son   . Colon cancer Neg Hx     Social History Social History   Tobacco Use  . Smoking status: Never Smoker  . Smokeless tobacco: Never Used  Substance Use Topics  . Alcohol use: No    Alcohol/week: 0.0 standard drinks  . Drug use: No    Types: Methylphenidate     Allergies   Aricept [donepezil hcl]; Coreg [carvedilol]; and Morphine sulfate   Review of Systems Review of Systems  Unable to perform ROS: Dementia  Cardiovascular: Negative for chest pain.     Physical Exam Updated Vital Signs BP (!) 150/67 (BP Location: Left Arm)   Pulse 73   Temp 97.8 F (36.6 C) (Oral)   Resp 16   Ht 5\' 2"  (1.575 m)   Wt 63.5 kg   SpO2 98%   BMI 25.61 kg/m   Physical Exam Vitals signs and nursing note reviewed.  Constitutional:       Appearance: She is well-developed.  HENT:     Head: Normocephalic.     Comments: Mild bruising to forehead    Nose: Nose normal.  Eyes:     General: No scleral icterus.    Conjunctiva/sclera: Conjunctivae normal.  Neck:     Musculoskeletal: Neck supple.     Thyroid: No thyromegaly.  Cardiovascular:     Rate and Rhythm: Normal rate and regular rhythm.     Heart sounds: No murmur. No friction rub. No gallop.   Pulmonary:     Breath sounds: No stridor. No wheezing or rales.  Chest:     Chest wall: No tenderness.  Abdominal:     General:  There is no distension.     Tenderness: There is no abdominal tenderness. There is no rebound.  Musculoskeletal: Normal range of motion.  Lymphadenopathy:     Cervical: No cervical adenopathy.  Skin:    Findings: No erythema or rash.  Neurological:     Motor: No abnormal muscle tone.     Coordination: Coordination normal.     Comments: Patient alert but does not speak [place]  Psychiatric:        Behavior: Behavior normal.      ED Treatments / Results  Labs (all labs ordered are listed, but only abnormal results are displayed) Labs Reviewed  CBG MONITORING, ED - Abnormal; Notable for the following components:      Result Value   Glucose-Capillary 112 (*)    All other components within normal limits    EKG None  Radiology Ct Head Wo Contrast  Result Date: 03/11/2018 CLINICAL DATA:  Fall from chair. EXAM: CT HEAD WITHOUT CONTRAST CT CERVICAL SPINE WITHOUT CONTRAST TECHNIQUE: Multidetector CT imaging of the head and cervical spine was performed following the standard protocol without intravenous contrast. Multiplanar CT image reconstructions of the cervical spine were also generated. COMPARISON:  None. FINDINGS: CT HEAD FINDINGS Brain: Moderate atrophy and white matter disease is stable. No acute intracranial abnormality is present. The ventricles are of proportionate to the degree of atrophy. No significant extraaxial fluid collection is  present. The brainstem and cerebellum are within normal limits. Vascular: Atherosclerotic calcifications are present within the cavernous internal carotid arteries. There is no hyperdense vessel. Skull: Hyperostosis frontalis internus is noted. Calvarium is otherwise normal. No significant extracranial soft tissue injury is evident. Sinuses/Orbits: Chronic left maxillary sinus obstruction is present. The paranasal sinuses and mastoid air cells are otherwise clear. Bilateral lens replacements are noted. Globes and orbits are otherwise unremarkable. CT CERVICAL SPINE FINDINGS Alignment: AP alignment is anatomic Skull base and vertebrae: Craniocervical junction is normal. No acute or healing fractures are present. No focal lytic or blastic lesions are present. Soft tissues and spinal canal: A 1.4 cm nodule is present at the inferior aspect of the left lobe of the thyroid. No other thyroid lesions are present. There is no significant adenopathy. Visualized salivary glands are normal. Disc levels: Chronic endplate facet degenerative changes and uncovertebral spurring is most evident at C5-6. Facet disease is greatest on the right at C2-3 and on the left at C4-5. Upper chest: Lung apices are clear. No focal nodule, mass, or airspace disease is present. IMPRESSION: 1. No acute trauma to the head or cervical spine. 2. Stable atrophy and white matter disease. 3. 1.4 cm left thyroid nodule.  No further follow-up CT recommended. Electronically Signed   By: San Morelle M.D.   On: 03/11/2018 10:16   Ct Cervical Spine Wo Contrast  Result Date: 03/11/2018 CLINICAL DATA:  Fall from chair. EXAM: CT HEAD WITHOUT CONTRAST CT CERVICAL SPINE WITHOUT CONTRAST TECHNIQUE: Multidetector CT imaging of the head and cervical spine was performed following the standard protocol without intravenous contrast. Multiplanar CT image reconstructions of the cervical spine were also generated. COMPARISON:  None. FINDINGS: CT HEAD FINDINGS  Brain: Moderate atrophy and white matter disease is stable. No acute intracranial abnormality is present. The ventricles are of proportionate to the degree of atrophy. No significant extraaxial fluid collection is present. The brainstem and cerebellum are within normal limits. Vascular: Atherosclerotic calcifications are present within the cavernous internal carotid arteries. There is no hyperdense vessel. Skull: Hyperostosis frontalis internus is noted.  Calvarium is otherwise normal. No significant extracranial soft tissue injury is evident. Sinuses/Orbits: Chronic left maxillary sinus obstruction is present. The paranasal sinuses and mastoid air cells are otherwise clear. Bilateral lens replacements are noted. Globes and orbits are otherwise unremarkable. CT CERVICAL SPINE FINDINGS Alignment: AP alignment is anatomic Skull base and vertebrae: Craniocervical junction is normal. No acute or healing fractures are present. No focal lytic or blastic lesions are present. Soft tissues and spinal canal: A 1.4 cm nodule is present at the inferior aspect of the left lobe of the thyroid. No other thyroid lesions are present. There is no significant adenopathy. Visualized salivary glands are normal. Disc levels: Chronic endplate facet degenerative changes and uncovertebral spurring is most evident at C5-6. Facet disease is greatest on the right at C2-3 and on the left at C4-5. Upper chest: Lung apices are clear. No focal nodule, mass, or airspace disease is present. IMPRESSION: 1. No acute trauma to the head or cervical spine. 2. Stable atrophy and white matter disease. 3. 1.4 cm left thyroid nodule.  No further follow-up CT recommended. Electronically Signed   By: San Morelle M.D.   On: 03/11/2018 10:16    Procedures Procedures (including critical care time)  Medications Ordered in ED Medications - No data to display   Initial Impression / Assessment and Plan / ED Course  I have reviewed the triage vital  signs and the nursing notes.  Pertinent labs & imaging results that were available during my care of the patient were reviewed by me and considered in my medical decision making (see chart for details).    CT scan head and cervical spine negative.  Patient with contusion to forehead.  She will be discharged home  Final Clinical Impressions(s) / ED Diagnoses   Final diagnoses:  Fall, initial encounter    ED Discharge Orders    None       Milton Ferguson, MD 03/11/18 1048

## 2018-03-11 NOTE — ED Triage Notes (Signed)
Pt coming from nursing facility complaining of a fall out of a chair. Pt was able to assist herself back up and into chair. Facility sent out for precautions. There is a mild abrasion to L side of face and L knee. EMS denies blood thinners, c-collar in place.

## 2018-03-11 NOTE — ED Notes (Signed)
Pt transported to CT at this time.

## 2018-03-11 NOTE — ED Notes (Signed)
[place], MD aware that pt helped herself out of bed and removed her c-collar on her own. Appears as though pt urinated in the toilet and then was found standing trying to put her clothing back on. Pt assisted back to bed and placed back in gown awaiting CT scan.

## 2018-03-11 NOTE — Discharge Instructions (Addendum)
Follow-up with your doctor if any problems.  Tylenol for pain

## 2018-03-16 DIAGNOSIS — I639 Cerebral infarction, unspecified: Secondary | ICD-10-CM | POA: Diagnosis not present

## 2018-03-16 DIAGNOSIS — F0391 Unspecified dementia with behavioral disturbance: Secondary | ICD-10-CM | POA: Diagnosis not present

## 2018-03-16 DIAGNOSIS — F419 Anxiety disorder, unspecified: Secondary | ICD-10-CM | POA: Diagnosis not present

## 2018-03-16 DIAGNOSIS — Z760 Encounter for issue of repeat prescription: Secondary | ICD-10-CM | POA: Diagnosis not present

## 2018-03-16 DIAGNOSIS — M542 Cervicalgia: Secondary | ICD-10-CM | POA: Diagnosis not present

## 2018-03-19 DIAGNOSIS — E119 Type 2 diabetes mellitus without complications: Secondary | ICD-10-CM | POA: Diagnosis not present

## 2018-03-19 DIAGNOSIS — E785 Hyperlipidemia, unspecified: Secondary | ICD-10-CM | POA: Diagnosis not present

## 2018-03-19 DIAGNOSIS — Z5181 Encounter for therapeutic drug level monitoring: Secondary | ICD-10-CM | POA: Diagnosis not present

## 2018-03-26 ENCOUNTER — Encounter (HOSPITAL_COMMUNITY): Payer: Self-pay | Admitting: Emergency Medicine

## 2018-03-26 ENCOUNTER — Emergency Department (HOSPITAL_COMMUNITY)
Admission: EM | Admit: 2018-03-26 | Discharge: 2018-03-26 | Disposition: A | Discharge status: Skilled Nursing Facility | Payer: Medicare Other | Attending: Emergency Medicine | Admitting: Emergency Medicine

## 2018-03-26 ENCOUNTER — Emergency Department (HOSPITAL_COMMUNITY): Payer: Medicare Other

## 2018-03-26 DIAGNOSIS — E119 Type 2 diabetes mellitus without complications: Secondary | ICD-10-CM | POA: Diagnosis not present

## 2018-03-26 DIAGNOSIS — I10 Essential (primary) hypertension: Secondary | ICD-10-CM | POA: Insufficient documentation

## 2018-03-26 DIAGNOSIS — Y939 Activity, unspecified: Secondary | ICD-10-CM | POA: Diagnosis not present

## 2018-03-26 DIAGNOSIS — Z7984 Long term (current) use of oral hypoglycemic drugs: Secondary | ICD-10-CM | POA: Diagnosis not present

## 2018-03-26 DIAGNOSIS — F0390 Unspecified dementia without behavioral disturbance: Secondary | ICD-10-CM | POA: Insufficient documentation

## 2018-03-26 DIAGNOSIS — W19XXXA Unspecified fall, initial encounter: Secondary | ICD-10-CM | POA: Diagnosis not present

## 2018-03-26 DIAGNOSIS — S0011XA Contusion of right eyelid and periocular area, initial encounter: Secondary | ICD-10-CM | POA: Diagnosis not present

## 2018-03-26 DIAGNOSIS — R52 Pain, unspecified: Secondary | ICD-10-CM | POA: Diagnosis not present

## 2018-03-26 DIAGNOSIS — S0512XA Contusion of eyeball and orbital tissues, left eye, initial encounter: Secondary | ICD-10-CM | POA: Diagnosis not present

## 2018-03-26 DIAGNOSIS — R5381 Other malaise: Secondary | ICD-10-CM | POA: Diagnosis not present

## 2018-03-26 DIAGNOSIS — S199XXA Unspecified injury of neck, initial encounter: Secondary | ICD-10-CM | POA: Diagnosis not present

## 2018-03-26 DIAGNOSIS — Z79899 Other long term (current) drug therapy: Secondary | ICD-10-CM | POA: Insufficient documentation

## 2018-03-26 DIAGNOSIS — Y92128 Other place in nursing home as the place of occurrence of the external cause: Secondary | ICD-10-CM | POA: Insufficient documentation

## 2018-03-26 DIAGNOSIS — S0990XA Unspecified injury of head, initial encounter: Secondary | ICD-10-CM | POA: Diagnosis not present

## 2018-03-26 DIAGNOSIS — S0993XA Unspecified injury of face, initial encounter: Secondary | ICD-10-CM | POA: Diagnosis not present

## 2018-03-26 DIAGNOSIS — Z7982 Long term (current) use of aspirin: Secondary | ICD-10-CM | POA: Diagnosis not present

## 2018-03-26 DIAGNOSIS — Y999 Unspecified external cause status: Secondary | ICD-10-CM | POA: Insufficient documentation

## 2018-03-26 HISTORY — DX: Unspecified dementia, unspecified severity, without behavioral disturbance, psychotic disturbance, mood disturbance, and anxiety: F03.90

## 2018-03-26 LAB — CBG MONITORING, ED: Glucose-Capillary: 117 mg/dL — ABNORMAL HIGH (ref 70–99)

## 2018-03-26 NOTE — ED Provider Notes (Signed)
[facility] DEPT Provider Note   CSN: [id number] Arrival date & time: 03/26/18  0253    History   Chief Complaint Chief Complaint  Patient presents with  . Fall    LEVEL 5 CAVEAT 2/2 DEMENTIA  HPI Alice Wong is a 84 y.o. female.     84 year old female with history of CVA, dementia, hypertension, depression presents to the emergency department from [place] where she had an unwitnessed fall.  She was seen walking in the hallway with a left orbital contusion.  She is not on chronic anticoagulation.  History limited due to dementia.   Fall     Past Medical History:  Diagnosis Date  . Anxiety   . Cholelithiasis   . CVA (cerebral vascular accident) ([place])   . Dementia ([place])   . Depression   . Diverticulosis   . GERD (gastroesophageal reflux disease)   . Gout 2009  . H. pylori infection 2011  . Hyperlipidemia   . Hypertension   . Memory loss   . MR (mitral regurgitation)   . Osteoporosis   . S/P cardiac cath 12/20/13 12/20/2013  . Thrombocytopenia ([place])   . Thyroid cyst   . Vaso-vagal reaction   . Vitamin D deficiency     Patient Active Problem List   Diagnosis Date Noted  . Agitation 12/09/2017  . Paranoia ([place])   . Incontinence of bowel 08/15/2017  . Chest wall contusion 02/21/2017  . Diarrhea 09/22/2015  . Redness of eye, right 02/15/2015  . Noncompliance with medication regimen 11/15/2014  . Dementia with behavioral disturbance ([place]) 07/28/2014  . Cerebral infarction ([place]) 07/21/2014  . Sternum pain 04/21/2014  . Costochondritis 04/13/2014  . Simple chronic bronchitis ([place]) 04/13/2014  . Cough 03/22/2014  . S/P cardiac cath 12/20/13, non obsteructive disease 12/20/2013  . Abnormal nuclear stress test, false positive per cath   . Vasovagal syncope 12/17/2013  . Midsternal chest pain 12/17/2013  . Neoplasm of uncertain behavior of skin 10/27/2013  . Uncontrolled type 2 diabetes with renal manifestation ([place])  11/04/2012  . Pain of right thumb 12/20/2011  . Psychosomatic disorder 08/23/2011  . Tinnitus 06/04/2011  . Headache, post-traumatic, chronic 05/01/2011  . LBP (low back pain) 01/30/2011  . Insomnia 11/19/2010  . Actinic keratoses 05/23/2010  . Dizziness 04/09/2010  . Bruising 04/09/2010  . Thrombocytopenia ([place]) 01/02/2010  . HEMORRHOIDS 12/12/2009  . HELICOBACTER PYLORI INFECTION 03/17/2009  . PARESTHESIA 03/17/2009  . Chest pain, unspecified 08/01/2008  . Vitamin D deficiency 03/03/2008  . GAIT IMBALANCE 10/22/2007  . Gout, unspecified 05/07/2007  . FOOT PAIN 05/07/2007  . Dyslipidemia 02/05/2007  . Anxiety state 02/05/2007  . Depression with anxiety 02/05/2007  . Essential hypertension 02/05/2007  . GERD 02/05/2007  . OSTEOPOROSIS 02/05/2007  . Memory loss 02/05/2007  . History of cardiovascular disorder 02/05/2007    Past Surgical History:  Procedure Laterality Date  . CESAREAN SECTION    . CHOLECYSTECTOMY  2010  . LASIK    . LEFT HEART CATHETERIZATION WITH CORONARY ANGIOGRAM N/A 12/20/2013   Procedure: LEFT HEART CATHETERIZATION WITH CORONARY ANGIOGRAM;  Surgeon: Burnell Blanks, MD;  Location: [facility] CATH LAB;  Service: Cardiovascular;  Laterality: N/A;     OB History   No obstetric history on file.      Home Medications    Prior to Admission medications   Medication Sig Start Date End Date Taking? Authorizing Provider  acetaminophen (TYLENOL) 500 MG tablet Take 500 mg by mouth every 4 (four)  hours as needed for mild pain.   Yes [provider]  amLODipine (NORVASC) 5 MG tablet TAKE 1 TABLET BY MOUTH EVERY DAY 09/16/17  Yes Plotnikov, Evie Lacks, MD  aspirin EC 81 MG EC tablet Take 1 tablet (81 mg total) by mouth daily. 12/20/13  Yes Isaiah Serge, NP  Cholecalciferol (D3-50) 1.25 MG (50000 UT) capsule Take 1 capsule (50,000 Units total) by mouth every 30 (thirty) days. 11/25/17  Yes Plotnikov, Evie Lacks, MD  DULoxetine (CYMBALTA) 60 MG capsule Take  1 capsule (60 mg total) by mouth daily. 02/14/18 02/14/19 Yes Arfeen, Arlyce Harman, MD  guaifenesin (ROBITUSSIN) 100 MG/5ML syrup Take 200 mg by mouth 4 (four) times daily as needed for cough.   Yes [provider]  LORazepam (ATIVAN) 2 MG tablet Take 1 tablet (2 mg total) by mouth every 6 (six) hours as needed for anxiety, sedation or sleep (agitation). 12/09/17  Yes Plotnikov, Evie Lacks, MD  memantine (NAMENDA) 10 MG tablet TAKE 1 TABLET BY MOUTH TWICE A DAY 10/03/17  Yes Plotnikov, Evie Lacks, MD  metFORMIN (GLUCOPHAGE) 500 MG tablet TAKE 1 TABLET BY MOUTH TWICE A DAY WITH MEALS 10/01/17  Yes Plotnikov, Evie Lacks, MD  nitroGLYCERIN (NITROSTAT) 0.4 MG SL tablet DISSOLVE ONE TABLET UNDER THE TONGUE EVERY 5 MINUTES AS NEEDED FOR CHEST PAIN Patient taking differently: Place 0.4 mg under the tongue every 5 (five) minutes as needed for chest pain.  08/23/16  Yes Plotnikov, Evie Lacks, MD  OLANZapine (ZYPREXA) 10 MG tablet Take 1 tablet (10 mg total) by mouth at bedtime. 02/14/18  Yes Arfeen, Arlyce Harman, MD  PARoxetine (PAXIL) 10 MG tablet Take 10 mg by mouth daily.   Yes [provider]  traZODone (DESYREL) 100 MG tablet Take 1 tablet (100 mg total) by mouth at bedtime. 02/14/18  Yes Arfeen, Arlyce Harman, MD    Family History Family History  Problem Relation Age of Onset  . Lung disease Father        Deceased  . Hypertension Other   . Stomach cancer Brother   . Ulcers Son   . Colon cancer Neg Hx     Social History Social History   Tobacco Use  . Smoking status: Never Smoker  . Smokeless tobacco: Never Used  Substance Use Topics  . Alcohol use: No    Alcohol/week: 0.0 standard drinks  . Drug use: No    Types: Methylphenidate     Allergies   Aricept [donepezil hcl]; Coreg [carvedilol]; and Morphine sulfate   Review of Systems Review of Systems  Unable to perform ROS: Dementia     Physical Exam Updated Vital Signs SpO2 97%   Physical Exam Vitals signs and nursing note reviewed.   Constitutional:      General: She is not in acute distress.    Appearance: She is well-developed. She is not diaphoretic.     Comments: Patient resting, in NAD  HENT:     Head: Normocephalic and atraumatic.     Right Ear: External ear normal.     Left Ear: External ear normal.  Eyes:     General: No scleral icterus.    Comments: Left orbital contusion with ecchymosis. Edema to the upper lid; difficulty visualizing conjunctiva as a result.   Neck:     Musculoskeletal: Normal range of motion.  Cardiovascular:     Rate and Rhythm: Normal rate and regular rhythm.     Pulses: Normal pulses.  Pulmonary:     Effort: Pulmonary  effort is normal. No respiratory distress.     Comments: Respirations even and unlabored Musculoskeletal: Normal range of motion.  Skin:    General: Skin is warm and dry.     Coloration: Skin is not pale.     Findings: No erythema or rash.     Comments: No visible injuries to trunk or extremities.  Neurological:     Mental Status: She is alert.     Comments: Mumbling [place]. Moving all extremities spontaneously.  Psychiatric:        Behavior: Behavior normal.      ED Treatments / Results  Labs (all labs ordered are listed, but only abnormal results are displayed) Labs Reviewed - No data to display  EKG None  Radiology No results found.  Procedures Procedures (including critical care time)  Medications Ordered in ED Medications - No data to display   5:40 AM Negative CTs of the head and C-spine. Periorbital contusion/hematoma noted without associated fracture.  5:48 AM Assessed by MD. Stable for discharge.   Initial Impression / Assessment and Plan / ED Course  I have reviewed the triage vital signs and the nursing notes.  Pertinent labs & imaging results that were available during my care of the patient were reviewed by me and considered in my medical decision making (see chart for details).        84 year old female presents  following an unwitnessed fall at her nursing facility.  She had CTs of her head, face, neck performed which are reassuring.  Noted to have periorbital contusion/hematoma to her left eye.  Otherwise well-appearing without other signs of external injury.  Moving all extremities.  Plan for additional outpatient follow-up as needed.  Discharged to her nursing facility in stable condition via [facility].   Final Clinical Impressions(s) / ED Diagnoses   Final diagnoses:  Fall, initial encounter  Orbital contusion, left, initial encounter    ED Discharge Orders    None       Antonietta Breach, PA-C 03/26/18 0550    Duffy Bruce, MD 03/27/18 [phone]

## 2018-03-26 NOTE — ED Notes (Signed)
[facility] called for transport, [facility] notified of patient's return.

## 2018-03-26 NOTE — Discharge Instructions (Addendum)
Continue Tylenol for management of pain.  Follow-up with your primary doctor as needed.

## 2018-03-30 DIAGNOSIS — Z760 Encounter for issue of repeat prescription: Secondary | ICD-10-CM | POA: Diagnosis not present

## 2018-03-30 DIAGNOSIS — I639 Cerebral infarction, unspecified: Secondary | ICD-10-CM | POA: Diagnosis not present

## 2018-03-30 DIAGNOSIS — F419 Anxiety disorder, unspecified: Secondary | ICD-10-CM | POA: Diagnosis not present

## 2018-03-30 DIAGNOSIS — R296 Repeated falls: Secondary | ICD-10-CM | POA: Diagnosis not present

## 2018-03-30 DIAGNOSIS — F0391 Unspecified dementia with behavioral disturbance: Secondary | ICD-10-CM | POA: Diagnosis not present

## 2018-04-14 DIAGNOSIS — R419 Unspecified symptoms and signs involving cognitive functions and awareness: Secondary | ICD-10-CM | POA: Diagnosis not present

## 2018-04-14 DIAGNOSIS — F331 Major depressive disorder, recurrent, moderate: Secondary | ICD-10-CM | POA: Diagnosis not present

## 2018-04-14 DIAGNOSIS — F419 Anxiety disorder, unspecified: Secondary | ICD-10-CM | POA: Diagnosis not present

## 2018-04-15 DIAGNOSIS — Z79899 Other long term (current) drug therapy: Secondary | ICD-10-CM | POA: Diagnosis not present

## 2018-04-15 DIAGNOSIS — E559 Vitamin D deficiency, unspecified: Secondary | ICD-10-CM | POA: Diagnosis not present

## 2018-04-15 DIAGNOSIS — D649 Anemia, unspecified: Secondary | ICD-10-CM | POA: Diagnosis not present

## 2018-04-15 DIAGNOSIS — E039 Hypothyroidism, unspecified: Secondary | ICD-10-CM | POA: Diagnosis not present

## 2018-04-15 DIAGNOSIS — E119 Type 2 diabetes mellitus without complications: Secondary | ICD-10-CM | POA: Diagnosis not present

## 2018-04-16 DIAGNOSIS — R293 Abnormal posture: Secondary | ICD-10-CM | POA: Diagnosis not present

## 2018-04-16 DIAGNOSIS — M6281 Muscle weakness (generalized): Secondary | ICD-10-CM | POA: Diagnosis not present

## 2018-04-16 DIAGNOSIS — R278 Other lack of coordination: Secondary | ICD-10-CM | POA: Diagnosis not present

## 2018-04-17 DIAGNOSIS — M6281 Muscle weakness (generalized): Secondary | ICD-10-CM | POA: Diagnosis not present

## 2018-04-17 DIAGNOSIS — R2681 Unsteadiness on feet: Secondary | ICD-10-CM | POA: Diagnosis not present

## 2018-04-20 DIAGNOSIS — R2681 Unsteadiness on feet: Secondary | ICD-10-CM | POA: Diagnosis not present

## 2018-04-20 DIAGNOSIS — R293 Abnormal posture: Secondary | ICD-10-CM | POA: Diagnosis not present

## 2018-04-20 DIAGNOSIS — M6281 Muscle weakness (generalized): Secondary | ICD-10-CM | POA: Diagnosis not present

## 2018-04-20 DIAGNOSIS — R278 Other lack of coordination: Secondary | ICD-10-CM | POA: Diagnosis not present

## 2018-04-22 DIAGNOSIS — M6281 Muscle weakness (generalized): Secondary | ICD-10-CM | POA: Diagnosis not present

## 2018-04-22 DIAGNOSIS — R293 Abnormal posture: Secondary | ICD-10-CM | POA: Diagnosis not present

## 2018-04-22 DIAGNOSIS — R2681 Unsteadiness on feet: Secondary | ICD-10-CM | POA: Diagnosis not present

## 2018-04-22 DIAGNOSIS — R278 Other lack of coordination: Secondary | ICD-10-CM | POA: Diagnosis not present

## 2018-04-24 DIAGNOSIS — R2681 Unsteadiness on feet: Secondary | ICD-10-CM | POA: Diagnosis not present

## 2018-04-24 DIAGNOSIS — R293 Abnormal posture: Secondary | ICD-10-CM | POA: Diagnosis not present

## 2018-04-24 DIAGNOSIS — R278 Other lack of coordination: Secondary | ICD-10-CM | POA: Diagnosis not present

## 2018-04-24 DIAGNOSIS — M6281 Muscle weakness (generalized): Secondary | ICD-10-CM | POA: Diagnosis not present

## 2018-04-27 DIAGNOSIS — R2681 Unsteadiness on feet: Secondary | ICD-10-CM | POA: Diagnosis not present

## 2018-04-27 DIAGNOSIS — R293 Abnormal posture: Secondary | ICD-10-CM | POA: Diagnosis not present

## 2018-04-27 DIAGNOSIS — R278 Other lack of coordination: Secondary | ICD-10-CM | POA: Diagnosis not present

## 2018-04-27 DIAGNOSIS — M6281 Muscle weakness (generalized): Secondary | ICD-10-CM | POA: Diagnosis not present

## 2018-04-29 DIAGNOSIS — R293 Abnormal posture: Secondary | ICD-10-CM | POA: Diagnosis not present

## 2018-04-29 DIAGNOSIS — M6281 Muscle weakness (generalized): Secondary | ICD-10-CM | POA: Diagnosis not present

## 2018-04-29 DIAGNOSIS — R2681 Unsteadiness on feet: Secondary | ICD-10-CM | POA: Diagnosis not present

## 2018-04-29 DIAGNOSIS — R278 Other lack of coordination: Secondary | ICD-10-CM | POA: Diagnosis not present

## 2018-05-01 DIAGNOSIS — R293 Abnormal posture: Secondary | ICD-10-CM | POA: Diagnosis not present

## 2018-05-01 DIAGNOSIS — M6281 Muscle weakness (generalized): Secondary | ICD-10-CM | POA: Diagnosis not present

## 2018-05-01 DIAGNOSIS — R278 Other lack of coordination: Secondary | ICD-10-CM | POA: Diagnosis not present

## 2018-05-01 DIAGNOSIS — R2681 Unsteadiness on feet: Secondary | ICD-10-CM | POA: Diagnosis not present

## 2018-05-04 DIAGNOSIS — R293 Abnormal posture: Secondary | ICD-10-CM | POA: Diagnosis not present

## 2018-05-04 DIAGNOSIS — R2681 Unsteadiness on feet: Secondary | ICD-10-CM | POA: Diagnosis not present

## 2018-05-04 DIAGNOSIS — R278 Other lack of coordination: Secondary | ICD-10-CM | POA: Diagnosis not present

## 2018-05-04 DIAGNOSIS — M6281 Muscle weakness (generalized): Secondary | ICD-10-CM | POA: Diagnosis not present

## 2018-05-06 DIAGNOSIS — R278 Other lack of coordination: Secondary | ICD-10-CM | POA: Diagnosis not present

## 2018-05-06 DIAGNOSIS — M6281 Muscle weakness (generalized): Secondary | ICD-10-CM | POA: Diagnosis not present

## 2018-05-06 DIAGNOSIS — R293 Abnormal posture: Secondary | ICD-10-CM | POA: Diagnosis not present

## 2018-05-07 DIAGNOSIS — R278 Other lack of coordination: Secondary | ICD-10-CM | POA: Diagnosis not present

## 2018-05-07 DIAGNOSIS — R293 Abnormal posture: Secondary | ICD-10-CM | POA: Diagnosis not present

## 2018-05-07 DIAGNOSIS — R2681 Unsteadiness on feet: Secondary | ICD-10-CM | POA: Diagnosis not present

## 2018-05-07 DIAGNOSIS — M6281 Muscle weakness (generalized): Secondary | ICD-10-CM | POA: Diagnosis not present

## 2018-05-08 DIAGNOSIS — M6281 Muscle weakness (generalized): Secondary | ICD-10-CM | POA: Diagnosis not present

## 2018-05-08 DIAGNOSIS — R2681 Unsteadiness on feet: Secondary | ICD-10-CM | POA: Diagnosis not present

## 2018-05-11 DIAGNOSIS — R293 Abnormal posture: Secondary | ICD-10-CM | POA: Diagnosis not present

## 2018-05-11 DIAGNOSIS — R2681 Unsteadiness on feet: Secondary | ICD-10-CM | POA: Diagnosis not present

## 2018-05-11 DIAGNOSIS — R278 Other lack of coordination: Secondary | ICD-10-CM | POA: Diagnosis not present

## 2018-05-11 DIAGNOSIS — M6281 Muscle weakness (generalized): Secondary | ICD-10-CM | POA: Diagnosis not present

## 2018-05-12 DIAGNOSIS — F331 Major depressive disorder, recurrent, moderate: Secondary | ICD-10-CM | POA: Diagnosis not present

## 2018-05-12 DIAGNOSIS — R419 Unspecified symptoms and signs involving cognitive functions and awareness: Secondary | ICD-10-CM | POA: Diagnosis not present

## 2018-05-12 DIAGNOSIS — F419 Anxiety disorder, unspecified: Secondary | ICD-10-CM | POA: Diagnosis not present

## 2018-05-13 DIAGNOSIS — R278 Other lack of coordination: Secondary | ICD-10-CM | POA: Diagnosis not present

## 2018-05-13 DIAGNOSIS — M6281 Muscle weakness (generalized): Secondary | ICD-10-CM | POA: Diagnosis not present

## 2018-05-13 DIAGNOSIS — R2681 Unsteadiness on feet: Secondary | ICD-10-CM | POA: Diagnosis not present

## 2018-05-13 DIAGNOSIS — R293 Abnormal posture: Secondary | ICD-10-CM | POA: Diagnosis not present

## 2018-05-14 DIAGNOSIS — M6281 Muscle weakness (generalized): Secondary | ICD-10-CM | POA: Diagnosis not present

## 2018-05-14 DIAGNOSIS — R293 Abnormal posture: Secondary | ICD-10-CM | POA: Diagnosis not present

## 2018-05-14 DIAGNOSIS — R278 Other lack of coordination: Secondary | ICD-10-CM | POA: Diagnosis not present

## 2018-05-15 DIAGNOSIS — M6281 Muscle weakness (generalized): Secondary | ICD-10-CM | POA: Diagnosis not present

## 2018-05-15 DIAGNOSIS — R2681 Unsteadiness on feet: Secondary | ICD-10-CM | POA: Diagnosis not present

## 2018-05-18 ENCOUNTER — Ambulatory Visit (HOSPITAL_COMMUNITY): Payer: Medicare Other | Admitting: Psychiatry

## 2018-05-18 DIAGNOSIS — M21612 Bunion of left foot: Secondary | ICD-10-CM | POA: Diagnosis not present

## 2018-05-18 DIAGNOSIS — M21611 Bunion of right foot: Secondary | ICD-10-CM | POA: Diagnosis not present

## 2018-05-18 DIAGNOSIS — R293 Abnormal posture: Secondary | ICD-10-CM | POA: Diagnosis not present

## 2018-05-18 DIAGNOSIS — M6281 Muscle weakness (generalized): Secondary | ICD-10-CM | POA: Diagnosis not present

## 2018-05-18 DIAGNOSIS — R2681 Unsteadiness on feet: Secondary | ICD-10-CM | POA: Diagnosis not present

## 2018-05-18 DIAGNOSIS — F0391 Unspecified dementia with behavioral disturbance: Secondary | ICD-10-CM | POA: Diagnosis not present

## 2018-05-18 DIAGNOSIS — R278 Other lack of coordination: Secondary | ICD-10-CM | POA: Diagnosis not present

## 2018-05-18 DIAGNOSIS — R296 Repeated falls: Secondary | ICD-10-CM | POA: Diagnosis not present

## 2018-05-18 DIAGNOSIS — M2041 Other hammer toe(s) (acquired), right foot: Secondary | ICD-10-CM | POA: Diagnosis not present

## 2018-05-18 DIAGNOSIS — F419 Anxiety disorder, unspecified: Secondary | ICD-10-CM | POA: Diagnosis not present

## 2018-05-20 DIAGNOSIS — R2681 Unsteadiness on feet: Secondary | ICD-10-CM | POA: Diagnosis not present

## 2018-05-20 DIAGNOSIS — R293 Abnormal posture: Secondary | ICD-10-CM | POA: Diagnosis not present

## 2018-05-20 DIAGNOSIS — M6281 Muscle weakness (generalized): Secondary | ICD-10-CM | POA: Diagnosis not present

## 2018-05-20 DIAGNOSIS — R278 Other lack of coordination: Secondary | ICD-10-CM | POA: Diagnosis not present

## 2018-05-21 DIAGNOSIS — R2681 Unsteadiness on feet: Secondary | ICD-10-CM | POA: Diagnosis not present

## 2018-05-21 DIAGNOSIS — R278 Other lack of coordination: Secondary | ICD-10-CM | POA: Diagnosis not present

## 2018-05-21 DIAGNOSIS — R293 Abnormal posture: Secondary | ICD-10-CM | POA: Diagnosis not present

## 2018-05-21 DIAGNOSIS — M6281 Muscle weakness (generalized): Secondary | ICD-10-CM | POA: Diagnosis not present

## 2018-05-25 DIAGNOSIS — R293 Abnormal posture: Secondary | ICD-10-CM | POA: Diagnosis not present

## 2018-05-25 DIAGNOSIS — R278 Other lack of coordination: Secondary | ICD-10-CM | POA: Diagnosis not present

## 2018-05-25 DIAGNOSIS — R2681 Unsteadiness on feet: Secondary | ICD-10-CM | POA: Diagnosis not present

## 2018-05-25 DIAGNOSIS — M6281 Muscle weakness (generalized): Secondary | ICD-10-CM | POA: Diagnosis not present

## 2018-05-27 DIAGNOSIS — M6281 Muscle weakness (generalized): Secondary | ICD-10-CM | POA: Diagnosis not present

## 2018-05-27 DIAGNOSIS — R2681 Unsteadiness on feet: Secondary | ICD-10-CM | POA: Diagnosis not present

## 2018-05-27 DIAGNOSIS — R293 Abnormal posture: Secondary | ICD-10-CM | POA: Diagnosis not present

## 2018-05-27 DIAGNOSIS — R278 Other lack of coordination: Secondary | ICD-10-CM | POA: Diagnosis not present

## 2018-05-28 DIAGNOSIS — R278 Other lack of coordination: Secondary | ICD-10-CM | POA: Diagnosis not present

## 2018-05-28 DIAGNOSIS — M6281 Muscle weakness (generalized): Secondary | ICD-10-CM | POA: Diagnosis not present

## 2018-05-28 DIAGNOSIS — R293 Abnormal posture: Secondary | ICD-10-CM | POA: Diagnosis not present

## 2018-05-28 DIAGNOSIS — R2681 Unsteadiness on feet: Secondary | ICD-10-CM | POA: Diagnosis not present

## 2018-06-01 DIAGNOSIS — R278 Other lack of coordination: Secondary | ICD-10-CM | POA: Diagnosis not present

## 2018-06-01 DIAGNOSIS — R2681 Unsteadiness on feet: Secondary | ICD-10-CM | POA: Diagnosis not present

## 2018-06-01 DIAGNOSIS — M6281 Muscle weakness (generalized): Secondary | ICD-10-CM | POA: Diagnosis not present

## 2018-06-01 DIAGNOSIS — R293 Abnormal posture: Secondary | ICD-10-CM | POA: Diagnosis not present

## 2018-06-03 DIAGNOSIS — M6281 Muscle weakness (generalized): Secondary | ICD-10-CM | POA: Diagnosis not present

## 2018-06-03 DIAGNOSIS — R2681 Unsteadiness on feet: Secondary | ICD-10-CM | POA: Diagnosis not present

## 2018-06-03 DIAGNOSIS — R293 Abnormal posture: Secondary | ICD-10-CM | POA: Diagnosis not present

## 2018-06-03 DIAGNOSIS — R278 Other lack of coordination: Secondary | ICD-10-CM | POA: Diagnosis not present

## 2018-06-04 DIAGNOSIS — R293 Abnormal posture: Secondary | ICD-10-CM | POA: Diagnosis not present

## 2018-06-04 DIAGNOSIS — R278 Other lack of coordination: Secondary | ICD-10-CM | POA: Diagnosis not present

## 2018-06-04 DIAGNOSIS — M6281 Muscle weakness (generalized): Secondary | ICD-10-CM | POA: Diagnosis not present

## 2018-06-04 DIAGNOSIS — R2681 Unsteadiness on feet: Secondary | ICD-10-CM | POA: Diagnosis not present

## 2018-06-08 DIAGNOSIS — R2681 Unsteadiness on feet: Secondary | ICD-10-CM | POA: Diagnosis not present

## 2018-06-08 DIAGNOSIS — R278 Other lack of coordination: Secondary | ICD-10-CM | POA: Diagnosis not present

## 2018-06-08 DIAGNOSIS — M6281 Muscle weakness (generalized): Secondary | ICD-10-CM | POA: Diagnosis not present

## 2018-06-08 DIAGNOSIS — R293 Abnormal posture: Secondary | ICD-10-CM | POA: Diagnosis not present

## 2018-06-09 DIAGNOSIS — R419 Unspecified symptoms and signs involving cognitive functions and awareness: Secondary | ICD-10-CM | POA: Diagnosis not present

## 2018-06-09 DIAGNOSIS — F331 Major depressive disorder, recurrent, moderate: Secondary | ICD-10-CM | POA: Diagnosis not present

## 2018-06-09 DIAGNOSIS — F419 Anxiety disorder, unspecified: Secondary | ICD-10-CM | POA: Diagnosis not present

## 2018-06-10 DIAGNOSIS — M6281 Muscle weakness (generalized): Secondary | ICD-10-CM | POA: Diagnosis not present

## 2018-06-10 DIAGNOSIS — R293 Abnormal posture: Secondary | ICD-10-CM | POA: Diagnosis not present

## 2018-06-10 DIAGNOSIS — R278 Other lack of coordination: Secondary | ICD-10-CM | POA: Diagnosis not present

## 2018-06-10 DIAGNOSIS — R2681 Unsteadiness on feet: Secondary | ICD-10-CM | POA: Diagnosis not present

## 2018-06-11 DIAGNOSIS — R278 Other lack of coordination: Secondary | ICD-10-CM | POA: Diagnosis not present

## 2018-06-11 DIAGNOSIS — M6281 Muscle weakness (generalized): Secondary | ICD-10-CM | POA: Diagnosis not present

## 2018-06-11 DIAGNOSIS — R293 Abnormal posture: Secondary | ICD-10-CM | POA: Diagnosis not present

## 2018-06-11 DIAGNOSIS — R2681 Unsteadiness on feet: Secondary | ICD-10-CM | POA: Diagnosis not present

## 2018-06-15 DIAGNOSIS — M6281 Muscle weakness (generalized): Secondary | ICD-10-CM | POA: Diagnosis not present

## 2018-06-15 DIAGNOSIS — R293 Abnormal posture: Secondary | ICD-10-CM | POA: Diagnosis not present

## 2018-06-15 DIAGNOSIS — R278 Other lack of coordination: Secondary | ICD-10-CM | POA: Diagnosis not present

## 2018-06-17 DIAGNOSIS — R293 Abnormal posture: Secondary | ICD-10-CM | POA: Diagnosis not present

## 2018-06-17 DIAGNOSIS — M6281 Muscle weakness (generalized): Secondary | ICD-10-CM | POA: Diagnosis not present

## 2018-06-17 DIAGNOSIS — R278 Other lack of coordination: Secondary | ICD-10-CM | POA: Diagnosis not present

## 2018-06-18 DIAGNOSIS — R278 Other lack of coordination: Secondary | ICD-10-CM | POA: Diagnosis not present

## 2018-06-18 DIAGNOSIS — M6281 Muscle weakness (generalized): Secondary | ICD-10-CM | POA: Diagnosis not present

## 2018-06-18 DIAGNOSIS — R293 Abnormal posture: Secondary | ICD-10-CM | POA: Diagnosis not present

## 2018-06-22 DIAGNOSIS — R278 Other lack of coordination: Secondary | ICD-10-CM | POA: Diagnosis not present

## 2018-06-22 DIAGNOSIS — M6281 Muscle weakness (generalized): Secondary | ICD-10-CM | POA: Diagnosis not present

## 2018-06-22 DIAGNOSIS — R293 Abnormal posture: Secondary | ICD-10-CM | POA: Diagnosis not present

## 2018-06-24 DIAGNOSIS — M6281 Muscle weakness (generalized): Secondary | ICD-10-CM | POA: Diagnosis not present

## 2018-06-24 DIAGNOSIS — R278 Other lack of coordination: Secondary | ICD-10-CM | POA: Diagnosis not present

## 2018-06-24 DIAGNOSIS — R293 Abnormal posture: Secondary | ICD-10-CM | POA: Diagnosis not present

## 2018-06-25 DIAGNOSIS — R278 Other lack of coordination: Secondary | ICD-10-CM | POA: Diagnosis not present

## 2018-06-25 DIAGNOSIS — R293 Abnormal posture: Secondary | ICD-10-CM | POA: Diagnosis not present

## 2018-06-25 DIAGNOSIS — M6281 Muscle weakness (generalized): Secondary | ICD-10-CM | POA: Diagnosis not present

## 2018-06-29 DIAGNOSIS — M6281 Muscle weakness (generalized): Secondary | ICD-10-CM | POA: Diagnosis not present

## 2018-06-29 DIAGNOSIS — R293 Abnormal posture: Secondary | ICD-10-CM | POA: Diagnosis not present

## 2018-06-29 DIAGNOSIS — R278 Other lack of coordination: Secondary | ICD-10-CM | POA: Diagnosis not present

## 2018-07-01 DIAGNOSIS — R278 Other lack of coordination: Secondary | ICD-10-CM | POA: Diagnosis not present

## 2018-07-01 DIAGNOSIS — M6281 Muscle weakness (generalized): Secondary | ICD-10-CM | POA: Diagnosis not present

## 2018-07-01 DIAGNOSIS — R293 Abnormal posture: Secondary | ICD-10-CM | POA: Diagnosis not present

## 2018-07-02 ENCOUNTER — Ambulatory Visit (HOSPITAL_COMMUNITY): Payer: Medicare Other | Admitting: Psychiatry

## 2018-07-02 DIAGNOSIS — R293 Abnormal posture: Secondary | ICD-10-CM | POA: Diagnosis not present

## 2018-07-02 DIAGNOSIS — R278 Other lack of coordination: Secondary | ICD-10-CM | POA: Diagnosis not present

## 2018-07-02 DIAGNOSIS — M6281 Muscle weakness (generalized): Secondary | ICD-10-CM | POA: Diagnosis not present

## 2018-07-07 DIAGNOSIS — F331 Major depressive disorder, recurrent, moderate: Secondary | ICD-10-CM | POA: Diagnosis not present

## 2018-07-07 DIAGNOSIS — F419 Anxiety disorder, unspecified: Secondary | ICD-10-CM | POA: Diagnosis not present

## 2018-07-07 DIAGNOSIS — R419 Unspecified symptoms and signs involving cognitive functions and awareness: Secondary | ICD-10-CM | POA: Diagnosis not present

## 2018-07-16 DIAGNOSIS — Z20828 Contact with and (suspected) exposure to other viral communicable diseases: Secondary | ICD-10-CM | POA: Diagnosis not present

## 2018-08-11 DIAGNOSIS — F331 Major depressive disorder, recurrent, moderate: Secondary | ICD-10-CM | POA: Diagnosis not present

## 2018-08-11 DIAGNOSIS — R4189 Other symptoms and signs involving cognitive functions and awareness: Secondary | ICD-10-CM | POA: Diagnosis not present

## 2018-08-11 DIAGNOSIS — F419 Anxiety disorder, unspecified: Secondary | ICD-10-CM | POA: Diagnosis not present

## 2018-08-14 DIAGNOSIS — R05 Cough: Secondary | ICD-10-CM | POA: Diagnosis not present

## 2018-08-17 ENCOUNTER — Other Ambulatory Visit: Payer: Self-pay

## 2018-08-31 DIAGNOSIS — F0391 Unspecified dementia with behavioral disturbance: Secondary | ICD-10-CM | POA: Diagnosis not present

## 2018-08-31 DIAGNOSIS — F419 Anxiety disorder, unspecified: Secondary | ICD-10-CM | POA: Diagnosis not present

## 2018-09-08 DIAGNOSIS — F331 Major depressive disorder, recurrent, moderate: Secondary | ICD-10-CM | POA: Diagnosis not present

## 2018-09-08 DIAGNOSIS — R419 Unspecified symptoms and signs involving cognitive functions and awareness: Secondary | ICD-10-CM | POA: Diagnosis not present

## 2018-09-08 DIAGNOSIS — F419 Anxiety disorder, unspecified: Secondary | ICD-10-CM | POA: Diagnosis not present

## 2018-10-01 DIAGNOSIS — M79675 Pain in left toe(s): Secondary | ICD-10-CM | POA: Diagnosis not present

## 2018-10-01 DIAGNOSIS — M79674 Pain in right toe(s): Secondary | ICD-10-CM | POA: Diagnosis not present

## 2018-10-01 DIAGNOSIS — B351 Tinea unguium: Secondary | ICD-10-CM | POA: Diagnosis not present

## 2018-10-07 DIAGNOSIS — M25552 Pain in left hip: Secondary | ICD-10-CM | POA: Diagnosis not present

## 2018-10-07 DIAGNOSIS — M79652 Pain in left thigh: Secondary | ICD-10-CM | POA: Diagnosis not present

## 2018-10-10 DIAGNOSIS — Z23 Encounter for immunization: Secondary | ICD-10-CM | POA: Diagnosis not present

## 2018-10-13 DIAGNOSIS — F331 Major depressive disorder, recurrent, moderate: Secondary | ICD-10-CM | POA: Diagnosis not present

## 2018-10-13 DIAGNOSIS — F419 Anxiety disorder, unspecified: Secondary | ICD-10-CM | POA: Diagnosis not present

## 2018-10-13 DIAGNOSIS — R419 Unspecified symptoms and signs involving cognitive functions and awareness: Secondary | ICD-10-CM | POA: Diagnosis not present

## 2018-10-16 ENCOUNTER — Encounter (HOSPITAL_COMMUNITY): Payer: Self-pay

## 2018-10-16 ENCOUNTER — Other Ambulatory Visit: Payer: Self-pay

## 2018-10-16 ENCOUNTER — Emergency Department (HOSPITAL_COMMUNITY): Payer: Medicare Other

## 2018-10-16 ENCOUNTER — Emergency Department (HOSPITAL_COMMUNITY)
Admission: EM | Admit: 2018-10-16 | Discharge: 2018-10-16 | Disposition: A | Discharge status: Home / Self Care | Payer: Medicare Other | Attending: Emergency Medicine | Admitting: Emergency Medicine

## 2018-10-16 DIAGNOSIS — Z79899 Other long term (current) drug therapy: Secondary | ICD-10-CM | POA: Diagnosis not present

## 2018-10-16 DIAGNOSIS — M2012 Hallux valgus (acquired), left foot: Secondary | ICD-10-CM | POA: Diagnosis not present

## 2018-10-16 DIAGNOSIS — Z7984 Long term (current) use of oral hypoglycemic drugs: Secondary | ICD-10-CM | POA: Diagnosis not present

## 2018-10-16 DIAGNOSIS — Z8673 Personal history of transient ischemic attack (TIA), and cerebral infarction without residual deficits: Secondary | ICD-10-CM | POA: Diagnosis not present

## 2018-10-16 DIAGNOSIS — M79605 Pain in left leg: Secondary | ICD-10-CM | POA: Insufficient documentation

## 2018-10-16 DIAGNOSIS — I10 Essential (primary) hypertension: Secondary | ICD-10-CM | POA: Diagnosis not present

## 2018-10-16 DIAGNOSIS — R456 Violent behavior: Secondary | ICD-10-CM | POA: Diagnosis not present

## 2018-10-16 DIAGNOSIS — Z743 Need for continuous supervision: Secondary | ICD-10-CM | POA: Diagnosis not present

## 2018-10-16 DIAGNOSIS — F0390 Unspecified dementia without behavioral disturbance: Secondary | ICD-10-CM | POA: Insufficient documentation

## 2018-10-16 DIAGNOSIS — M19072 Primary osteoarthritis, left ankle and foot: Secondary | ICD-10-CM | POA: Diagnosis not present

## 2018-10-16 DIAGNOSIS — Z7982 Long term (current) use of aspirin: Secondary | ICD-10-CM | POA: Diagnosis not present

## 2018-10-16 DIAGNOSIS — R279 Unspecified lack of coordination: Secondary | ICD-10-CM | POA: Diagnosis not present

## 2018-10-16 DIAGNOSIS — R0902 Hypoxemia: Secondary | ICD-10-CM | POA: Diagnosis not present

## 2018-10-16 DIAGNOSIS — R404 Transient alteration of awareness: Secondary | ICD-10-CM | POA: Diagnosis not present

## 2018-10-16 NOTE — Discharge Instructions (Signed)
Return for any problem.  Follow-up with your regular care provider as instructed.  X-rays performed today of your entire left leg did not reveal any acute fracture or other significant abnormality.  If your symptoms worsen or fail to improve please feel free to return to the ED for further evaluation.

## 2018-10-16 NOTE — ED Notes (Signed)
This Probation officer called and spoke with patient's daughter, [name]. Updated patient family on xray results and patient discharge. Patient daughter pleased and denies further needs or concerns at this time.

## 2018-10-16 NOTE — ED Notes (Signed)
This writer rounded on Alice Wong and found Alice Wong standing, naked, peeing on the floor. This RN cleaned Alice Wong, performed peri care, redressed Alice Wong, changed Alice Wong linens, and helped Alice Wong ambulate back to bed. Dr Francia Greaves walked in as Alice Wong was ambulating with this RN and noted Alice Wong did not show any signs of pain while ambulating. Alice Wong to be discharged. VSS. Alice Wong provided with [place] sandwich and applesauce for dinner.

## 2018-10-16 NOTE — ED Notes (Signed)
Pt ambulatory, walking around the unit with assistance from the writer of this note.

## 2018-10-16 NOTE — ED Provider Notes (Signed)
[facility] DEPT Provider Note   CSN: [id number] Arrival date & time: 10/16/18  1509     History   Chief Complaint Chief Complaint  Patient presents with  . Leg Pain    HPI Alice Wong is a 84 y.o. female.     84 year old female with prior medical history as detailed below presents for evaluation of reported left leg pain.  Patient with longstanding history of dementia.  Per her daughter, the patient is essentially nonverbal.  She has minimal response to both [place] and [place] questioning.  Per the patient's daughter, the patient has had a slight limp and appears to be favoring the left leg for the last 2 or 3 days.  No reported fall was discovered.  Patient is a resident at [facility].  She is transported here today for evaluation of apparent left leg pain.  At the time of my exam the patient appears to be comfortable.  All history is obtained from the patient's daughter via phone conversation [name] Andresscu [phone]).  She confirms that the patient has had a slight limp for the last 2 to 3 days and appears to be favoring her left leg.  She does not have any reported fall or other traumatic injury.  Her mental status is at baseline.  Level 5 caveat secondary to patient's profound dementia.  The history is provided by the patient, medical records and a relative. The history is limited by the condition of the patient.  Leg Pain Location:  Leg Time since incident:  3 days Leg location:  L leg Pain details:    Quality:  Unable to specify   Timing:  Unable to specify Foreign body present:  Unable to specify   Past Medical History:  Diagnosis Date  . Anxiety   . Cholelithiasis   . CVA (cerebral vascular accident) ([place])   . Dementia ([place])   . Depression   . Diverticulosis   . GERD (gastroesophageal reflux disease)   . Gout 2009  . H. pylori infection 2011  . Hyperlipidemia   . Hypertension   . Memory loss   . MR (mitral  regurgitation)   . Osteoporosis   . S/P cardiac cath 12/20/13 12/20/2013  . Thrombocytopenia ([place])   . Thyroid cyst   . Vaso-vagal reaction   . Vitamin D deficiency     Patient Active Problem List   Diagnosis Date Noted  . Agitation 12/09/2017  . Paranoia ([place])   . Incontinence of bowel 08/15/2017  . Chest wall contusion 02/21/2017  . Diarrhea 09/22/2015  . Redness of eye, right 02/15/2015  . Noncompliance with medication regimen 11/15/2014  . Dementia with behavioral disturbance ([place]) 07/28/2014  . Cerebral infarction ([place]) 07/21/2014  . Sternum pain 04/21/2014  . Costochondritis 04/13/2014  . Simple chronic bronchitis ([place]) 04/13/2014  . Cough 03/22/2014  . S/P cardiac cath 12/20/13, non obsteructive disease 12/20/2013  . Abnormal nuclear stress test, false positive per cath   . Vasovagal syncope 12/17/2013  . Midsternal chest pain 12/17/2013  . Neoplasm of uncertain behavior of skin 10/27/2013  . Uncontrolled type 2 diabetes with renal manifestation ([place]) 11/04/2012  . Pain of right thumb 12/20/2011  . Psychosomatic disorder 08/23/2011  . Tinnitus 06/04/2011  . Headache, post-traumatic, chronic 05/01/2011  . LBP (low back pain) 01/30/2011  . Insomnia 11/19/2010  . Actinic keratoses 05/23/2010  . Dizziness 04/09/2010  . Bruising 04/09/2010  . Thrombocytopenia ([place]) 01/02/2010  . HEMORRHOIDS 12/12/2009  . HELICOBACTER  PYLORI INFECTION 03/17/2009  . PARESTHESIA 03/17/2009  . Chest pain, unspecified 08/01/2008  . Vitamin D deficiency 03/03/2008  . GAIT IMBALANCE 10/22/2007  . Gout, unspecified 05/07/2007  . FOOT PAIN 05/07/2007  . Dyslipidemia 02/05/2007  . Anxiety state 02/05/2007  . Depression with anxiety 02/05/2007  . Essential hypertension 02/05/2007  . GERD 02/05/2007  . OSTEOPOROSIS 02/05/2007  . Memory loss 02/05/2007  . History of cardiovascular disorder 02/05/2007    Past Surgical History:  Procedure Laterality Date  . CESAREAN SECTION    .  CHOLECYSTECTOMY  2010  . LASIK    . LEFT HEART CATHETERIZATION WITH CORONARY ANGIOGRAM N/A 12/20/2013   Procedure: LEFT HEART CATHETERIZATION WITH CORONARY ANGIOGRAM;  Surgeon: Burnell Blanks, MD;  Location: [facility] CATH LAB;  Service: Cardiovascular;  Laterality: N/A;     OB History   No obstetric history on file.      Home Medications    Prior to Admission medications   Medication Sig Start Date End Date Taking? Authorizing Provider  acetaminophen (TYLENOL) 500 MG tablet Take 500 mg by mouth every 4 (four) hours as needed for mild pain.    [provider]  amLODipine (NORVASC) 5 MG tablet TAKE 1 TABLET BY MOUTH EVERY DAY 09/16/17   Plotnikov, Evie Lacks, MD  aspirin EC 81 MG EC tablet Take 1 tablet (81 mg total) by mouth daily. 12/20/13   Isaiah Serge, NP  Cholecalciferol (D3-50) 1.25 MG (50000 UT) capsule Take 1 capsule (50,000 Units total) by mouth every 30 (thirty) days. 11/25/17   Plotnikov, Evie Lacks, MD  DULoxetine (CYMBALTA) 60 MG capsule Take 1 capsule (60 mg total) by mouth daily. 02/14/18 02/14/19  Arfeen, Arlyce Harman, MD  guaifenesin (ROBITUSSIN) 100 MG/5ML syrup Take 200 mg by mouth 4 (four) times daily as needed for cough.    [provider]  LORazepam (ATIVAN) 2 MG tablet Take 1 tablet (2 mg total) by mouth every 6 (six) hours as needed for anxiety, sedation or sleep (agitation). 12/09/17   Plotnikov, Evie Lacks, MD  memantine (NAMENDA) 10 MG tablet TAKE 1 TABLET BY MOUTH TWICE A DAY 10/03/17   Plotnikov, Evie Lacks, MD  metFORMIN (GLUCOPHAGE) 500 MG tablet TAKE 1 TABLET BY MOUTH TWICE A DAY WITH MEALS 10/01/17   Plotnikov, Evie Lacks, MD  nitroGLYCERIN (NITROSTAT) 0.4 MG SL tablet DISSOLVE ONE TABLET UNDER THE TONGUE EVERY 5 MINUTES AS NEEDED FOR CHEST PAIN Patient taking differently: Place 0.4 mg under the tongue every 5 (five) minutes as needed for chest pain.  08/23/16   Plotnikov, Evie Lacks, MD  OLANZapine (ZYPREXA) 10 MG tablet Take 1 tablet (10 mg total) by  mouth at bedtime. 02/14/18   Arfeen, Arlyce Harman, MD  PARoxetine (PAXIL) 10 MG tablet Take 10 mg by mouth daily.    [provider]  traZODone (DESYREL) 100 MG tablet Take 1 tablet (100 mg total) by mouth at bedtime. 02/14/18   Arfeen, Arlyce Harman, MD    Family History Family History  Problem Relation Age of Onset  . Lung disease Father        Deceased  . Hypertension Other   . Stomach cancer Brother   . Ulcers Son   . Colon cancer Neg Hx     Social History Social History   Tobacco Use  . Smoking status: Never Smoker  . Smokeless tobacco: Never Used  Substance Use Topics  . Alcohol use: No    Alcohol/week: 0.0 standard drinks  . Drug use: No  Types: Methylphenidate     Allergies   Aricept [donepezil hcl], Coreg [carvedilol], and Morphine sulfate   Review of Systems Review of Systems  Unable to perform ROS: Dementia     Physical Exam Updated Vital Signs BP (!) 182/87 (BP Location: Left Arm)   Pulse 71   Temp 97.8 F (36.6 C) (Oral)   Resp 13   SpO2 97%   Physical Exam Vitals signs and nursing note reviewed.  Constitutional:      General: She is not in acute distress.    Appearance: Normal appearance. She is well-developed.  HENT:     Head: Normocephalic and atraumatic.  Eyes:     Conjunctiva/sclera: Conjunctivae normal.     Pupils: Pupils are equal, round, and reactive to light.  Neck:     Musculoskeletal: Normal range of motion and neck supple.  Cardiovascular:     Rate and Rhythm: Normal rate and regular rhythm.     Heart sounds: Normal heart sounds.  Pulmonary:     Effort: Pulmonary effort is normal. No respiratory distress.     Breath sounds: Normal breath sounds.  Abdominal:     General: There is no distension.     Palpations: Abdomen is soft.     Tenderness: There is no abdominal tenderness.  Musculoskeletal: Normal range of motion.        General: No deformity.  Skin:    General: Skin is warm and dry.  Neurological:     General: No focal  deficit present.     Mental Status: She is alert. Mental status is at baseline.     Comments: Alert.  Does not answer questioning.  Moves all 4 extremities.        ED Treatments / Results  Labs (all labs ordered are listed, but only abnormal results are displayed) Labs Reviewed - No data to display  EKG None  Radiology Dg Tibia/fibula Left  Result Date: 10/16/2018 CLINICAL DATA:  LEFT leg pain EXAM: LEFT TIBIA AND FIBULA - 2 VIEW COMPARISON:  LEFT knee radiographs 11/21/2012 FINDINGS: Osseous demineralization. Mild joint space narrowing LEFT knee. Ankle joint space preserved. No acute fracture, dislocation, or bone destruction. IMPRESSION: Osseous demineralization with minimal degenerative changes LEFT knee. No acute abnormalities. Electronically Signed   By: Lavonia Dana M.D.   On: 10/16/2018 17:26   Dg Foot 2 Views Left  Result Date: 10/16/2018 CLINICAL DATA:  Left leg pain EXAM: LEFT FOOT - 2 VIEW COMPARISON:  None FINDINGS: Osseous demineralization. Tiny plantar calcaneal spur. Hallux valgus with bunion deformity and lateral subluxation of proximal phalanx great toe. Joint space narrowing at first MTP joint. Probably old healed fracture at proximal phalanx great toe. No additional fracture, dislocation, or bone destruction. IMPRESSION: Hallux valgus with degenerative changes first MTP joint, lateral subluxation of proximal phalanx great toe and bunion deformity. Probably old fracture of proximal phalanx great toe. No acute abnormalities. Electronically Signed   By: Lavonia Dana M.D.   On: 10/16/2018 17:29   Dg Hip Unilat W Or Wo Pelvis 2-3 Views Left  Result Date: 10/16/2018 CLINICAL DATA:  LEFT leg pain EXAM: DG HIP (WITH OR WITHOUT PELVIS) 2-3V LEFT COMPARISON:  None FINDINGS: Osseous demineralization. Hip joint spaces fairly well preserved. SI joints preserved. No acute fracture, dislocation, or bone destruction. IMPRESSION: Osseous demineralization. No acute abnormalities.  Electronically Signed   By: Lavonia Dana M.D.   On: 10/16/2018 17:18   Dg Femur Min 2 Views Left  Result Date: 10/16/2018 CLINICAL DATA:  LEFT leg pain, no injury EXAM: LEFT FEMUR 2 VIEWS COMPARISON:  None FINDINGS: Osseous demineralization. Hip joint space preserved. Minimal narrowing of LEFT knee. No acute fracture, dislocation, or bone destruction. IMPRESSION: No acute osseous abnormalities. Electronically Signed   By: Lavonia Dana M.D.   On: 10/16/2018 17:27    Procedures Procedures (including critical care time)  Medications Ordered in ED Medications - No data to display   Initial Impression / Assessment and Plan / ED Course  I have reviewed the triage vital signs and the nursing notes.  Pertinent labs & imaging results that were available during my care of the patient were reviewed by me and considered in my medical decision making (see chart for details).        MDM  Screen complete  Alice Wong was evaluated in Emergency Department on 10/16/2018 for the symptoms described in the history of present illness. She was evaluated in the context of the global COVID-19 pandemic, which necessitated consideration that the patient might be at risk for infection with the SARS-CoV-2 virus that causes COVID-19. Institutional protocols and algorithms that pertain to the evaluation of patients at risk for COVID-19 are in a state of rapid change based on information released by regulatory bodies including the CDC and federal and state organizations. These policies and algorithms were followed during the patient's care in the ED.  Patient reportedly has had minimal antalgic gait with apparent favoring of her left lower extremity.  The patient is able to ambulate without significant difficulty in the ED.  Plain films obtained of the entire left leg did not reveal significant acute pathology.  Patient appears to be appropriate for discharge and return to her facility.  Patient's family is  aware of plan of care.  Importance of close follow-up is stressed to the patient's daughter.   Final Clinical Impressions(s) / ED Diagnoses   Final diagnoses:  Left leg pain    ED Discharge Orders    None       Valarie Merino, MD 10/16/18 [id number]

## 2018-10-16 NOTE — ED Triage Notes (Signed)
Per EMS-complaining of left leg pain, no falls, injury-decreased mobility due to pain-tried to get film of leg but patient got combative-dementia

## 2018-10-16 NOTE — ED Notes (Signed)
[facility] called for patient transport back to [facility]. [place] called for report- no answer.

## 2018-10-26 DIAGNOSIS — F0391 Unspecified dementia with behavioral disturbance: Secondary | ICD-10-CM | POA: Diagnosis not present

## 2018-10-26 DIAGNOSIS — F419 Anxiety disorder, unspecified: Secondary | ICD-10-CM | POA: Diagnosis not present

## 2018-11-10 DIAGNOSIS — R419 Unspecified symptoms and signs involving cognitive functions and awareness: Secondary | ICD-10-CM | POA: Diagnosis not present

## 2018-11-10 DIAGNOSIS — F419 Anxiety disorder, unspecified: Secondary | ICD-10-CM | POA: Diagnosis not present

## 2018-11-10 DIAGNOSIS — F331 Major depressive disorder, recurrent, moderate: Secondary | ICD-10-CM | POA: Diagnosis not present

## 2018-11-16 DIAGNOSIS — F0391 Unspecified dementia with behavioral disturbance: Secondary | ICD-10-CM | POA: Diagnosis not present

## 2018-11-16 DIAGNOSIS — M6281 Muscle weakness (generalized): Secondary | ICD-10-CM | POA: Diagnosis not present

## 2018-11-20 DIAGNOSIS — Z20828 Contact with and (suspected) exposure to other viral communicable diseases: Secondary | ICD-10-CM | POA: Diagnosis not present

## 2018-12-02 DIAGNOSIS — Z20828 Contact with and (suspected) exposure to other viral communicable diseases: Secondary | ICD-10-CM | POA: Diagnosis not present

## 2018-12-04 DIAGNOSIS — R293 Abnormal posture: Secondary | ICD-10-CM | POA: Diagnosis not present

## 2018-12-04 DIAGNOSIS — M6281 Muscle weakness (generalized): Secondary | ICD-10-CM | POA: Diagnosis not present

## 2018-12-04 DIAGNOSIS — R278 Other lack of coordination: Secondary | ICD-10-CM | POA: Diagnosis not present

## 2018-12-07 DIAGNOSIS — R2689 Other abnormalities of gait and mobility: Secondary | ICD-10-CM | POA: Diagnosis not present

## 2018-12-07 DIAGNOSIS — R278 Other lack of coordination: Secondary | ICD-10-CM | POA: Diagnosis not present

## 2018-12-07 DIAGNOSIS — M6281 Muscle weakness (generalized): Secondary | ICD-10-CM | POA: Diagnosis not present

## 2018-12-07 DIAGNOSIS — R2681 Unsteadiness on feet: Secondary | ICD-10-CM | POA: Diagnosis not present

## 2018-12-07 DIAGNOSIS — R293 Abnormal posture: Secondary | ICD-10-CM | POA: Diagnosis not present

## 2018-12-08 DIAGNOSIS — M6281 Muscle weakness (generalized): Secondary | ICD-10-CM | POA: Diagnosis not present

## 2018-12-08 DIAGNOSIS — R278 Other lack of coordination: Secondary | ICD-10-CM | POA: Diagnosis not present

## 2018-12-08 DIAGNOSIS — R2681 Unsteadiness on feet: Secondary | ICD-10-CM | POA: Diagnosis not present

## 2018-12-08 DIAGNOSIS — R293 Abnormal posture: Secondary | ICD-10-CM | POA: Diagnosis not present

## 2018-12-08 DIAGNOSIS — R2689 Other abnormalities of gait and mobility: Secondary | ICD-10-CM | POA: Diagnosis not present

## 2018-12-09 DIAGNOSIS — R278 Other lack of coordination: Secondary | ICD-10-CM | POA: Diagnosis not present

## 2018-12-09 DIAGNOSIS — R293 Abnormal posture: Secondary | ICD-10-CM | POA: Diagnosis not present

## 2018-12-09 DIAGNOSIS — M6281 Muscle weakness (generalized): Secondary | ICD-10-CM | POA: Diagnosis not present

## 2018-12-10 DIAGNOSIS — R2689 Other abnormalities of gait and mobility: Secondary | ICD-10-CM | POA: Diagnosis not present

## 2018-12-10 DIAGNOSIS — R2681 Unsteadiness on feet: Secondary | ICD-10-CM | POA: Diagnosis not present

## 2018-12-14 DIAGNOSIS — M6281 Muscle weakness (generalized): Secondary | ICD-10-CM | POA: Diagnosis not present

## 2018-12-14 DIAGNOSIS — R419 Unspecified symptoms and signs involving cognitive functions and awareness: Secondary | ICD-10-CM | POA: Diagnosis not present

## 2018-12-14 DIAGNOSIS — R2681 Unsteadiness on feet: Secondary | ICD-10-CM | POA: Diagnosis not present

## 2018-12-14 DIAGNOSIS — F331 Major depressive disorder, recurrent, moderate: Secondary | ICD-10-CM | POA: Diagnosis not present

## 2018-12-14 DIAGNOSIS — R2689 Other abnormalities of gait and mobility: Secondary | ICD-10-CM | POA: Diagnosis not present

## 2018-12-14 DIAGNOSIS — R278 Other lack of coordination: Secondary | ICD-10-CM | POA: Diagnosis not present

## 2018-12-14 DIAGNOSIS — F419 Anxiety disorder, unspecified: Secondary | ICD-10-CM | POA: Diagnosis not present

## 2018-12-14 DIAGNOSIS — R293 Abnormal posture: Secondary | ICD-10-CM | POA: Diagnosis not present

## 2018-12-16 DIAGNOSIS — R2689 Other abnormalities of gait and mobility: Secondary | ICD-10-CM | POA: Diagnosis not present

## 2018-12-16 DIAGNOSIS — R2681 Unsteadiness on feet: Secondary | ICD-10-CM | POA: Diagnosis not present

## 2018-12-18 DIAGNOSIS — R2681 Unsteadiness on feet: Secondary | ICD-10-CM | POA: Diagnosis not present

## 2018-12-18 DIAGNOSIS — R2689 Other abnormalities of gait and mobility: Secondary | ICD-10-CM | POA: Diagnosis not present

## 2018-12-21 DIAGNOSIS — Z20828 Contact with and (suspected) exposure to other viral communicable diseases: Secondary | ICD-10-CM | POA: Diagnosis not present

## 2018-12-21 DIAGNOSIS — R2681 Unsteadiness on feet: Secondary | ICD-10-CM | POA: Diagnosis not present

## 2018-12-21 DIAGNOSIS — R2689 Other abnormalities of gait and mobility: Secondary | ICD-10-CM | POA: Diagnosis not present

## 2018-12-23 DIAGNOSIS — R2681 Unsteadiness on feet: Secondary | ICD-10-CM | POA: Diagnosis not present

## 2018-12-23 DIAGNOSIS — R2689 Other abnormalities of gait and mobility: Secondary | ICD-10-CM | POA: Diagnosis not present

## 2018-12-25 DIAGNOSIS — R2689 Other abnormalities of gait and mobility: Secondary | ICD-10-CM | POA: Diagnosis not present

## 2018-12-25 DIAGNOSIS — R2681 Unsteadiness on feet: Secondary | ICD-10-CM | POA: Diagnosis not present

## 2018-12-28 DIAGNOSIS — R2689 Other abnormalities of gait and mobility: Secondary | ICD-10-CM | POA: Diagnosis not present

## 2018-12-28 DIAGNOSIS — R2681 Unsteadiness on feet: Secondary | ICD-10-CM | POA: Diagnosis not present

## 2018-12-30 DIAGNOSIS — R2681 Unsteadiness on feet: Secondary | ICD-10-CM | POA: Diagnosis not present

## 2018-12-30 DIAGNOSIS — R2689 Other abnormalities of gait and mobility: Secondary | ICD-10-CM | POA: Diagnosis not present

## 2019-01-01 DIAGNOSIS — R2681 Unsteadiness on feet: Secondary | ICD-10-CM | POA: Diagnosis not present

## 2019-01-01 DIAGNOSIS — R2689 Other abnormalities of gait and mobility: Secondary | ICD-10-CM | POA: Diagnosis not present

## 2019-01-04 DIAGNOSIS — R2689 Other abnormalities of gait and mobility: Secondary | ICD-10-CM | POA: Diagnosis not present

## 2019-01-04 DIAGNOSIS — R2681 Unsteadiness on feet: Secondary | ICD-10-CM | POA: Diagnosis not present

## 2019-01-11 DIAGNOSIS — R2689 Other abnormalities of gait and mobility: Secondary | ICD-10-CM | POA: Diagnosis not present

## 2019-01-11 DIAGNOSIS — R2681 Unsteadiness on feet: Secondary | ICD-10-CM | POA: Diagnosis not present

## 2019-01-13 DIAGNOSIS — R2689 Other abnormalities of gait and mobility: Secondary | ICD-10-CM | POA: Diagnosis not present

## 2019-01-13 DIAGNOSIS — R2681 Unsteadiness on feet: Secondary | ICD-10-CM | POA: Diagnosis not present

## 2019-01-15 DIAGNOSIS — R278 Other lack of coordination: Secondary | ICD-10-CM | POA: Diagnosis not present

## 2019-01-15 DIAGNOSIS — R293 Abnormal posture: Secondary | ICD-10-CM | POA: Diagnosis not present

## 2019-01-15 DIAGNOSIS — M6281 Muscle weakness (generalized): Secondary | ICD-10-CM | POA: Diagnosis not present

## 2019-01-18 DIAGNOSIS — R2681 Unsteadiness on feet: Secondary | ICD-10-CM | POA: Diagnosis not present

## 2019-01-18 DIAGNOSIS — R2689 Other abnormalities of gait and mobility: Secondary | ICD-10-CM | POA: Diagnosis not present

## 2019-01-18 DIAGNOSIS — R278 Other lack of coordination: Secondary | ICD-10-CM | POA: Diagnosis not present

## 2019-01-18 DIAGNOSIS — R293 Abnormal posture: Secondary | ICD-10-CM | POA: Diagnosis not present

## 2019-01-18 DIAGNOSIS — M6281 Muscle weakness (generalized): Secondary | ICD-10-CM | POA: Diagnosis not present

## 2019-01-20 DIAGNOSIS — R2689 Other abnormalities of gait and mobility: Secondary | ICD-10-CM | POA: Diagnosis not present

## 2019-01-20 DIAGNOSIS — R293 Abnormal posture: Secondary | ICD-10-CM | POA: Diagnosis not present

## 2019-01-20 DIAGNOSIS — M6281 Muscle weakness (generalized): Secondary | ICD-10-CM | POA: Diagnosis not present

## 2019-01-20 DIAGNOSIS — R278 Other lack of coordination: Secondary | ICD-10-CM | POA: Diagnosis not present

## 2019-01-20 DIAGNOSIS — R2681 Unsteadiness on feet: Secondary | ICD-10-CM | POA: Diagnosis not present

## 2019-01-22 DIAGNOSIS — M6281 Muscle weakness (generalized): Secondary | ICD-10-CM | POA: Diagnosis not present

## 2019-01-22 DIAGNOSIS — R293 Abnormal posture: Secondary | ICD-10-CM | POA: Diagnosis not present

## 2019-01-22 DIAGNOSIS — R278 Other lack of coordination: Secondary | ICD-10-CM | POA: Diagnosis not present

## 2019-01-25 DIAGNOSIS — R278 Other lack of coordination: Secondary | ICD-10-CM | POA: Diagnosis not present

## 2019-01-25 DIAGNOSIS — R293 Abnormal posture: Secondary | ICD-10-CM | POA: Diagnosis not present

## 2019-01-25 DIAGNOSIS — R2681 Unsteadiness on feet: Secondary | ICD-10-CM | POA: Diagnosis not present

## 2019-01-25 DIAGNOSIS — R2689 Other abnormalities of gait and mobility: Secondary | ICD-10-CM | POA: Diagnosis not present

## 2019-01-25 DIAGNOSIS — M6281 Muscle weakness (generalized): Secondary | ICD-10-CM | POA: Diagnosis not present

## 2019-01-26 DIAGNOSIS — M6281 Muscle weakness (generalized): Secondary | ICD-10-CM | POA: Diagnosis not present

## 2019-01-26 DIAGNOSIS — R278 Other lack of coordination: Secondary | ICD-10-CM | POA: Diagnosis not present

## 2019-01-26 DIAGNOSIS — R293 Abnormal posture: Secondary | ICD-10-CM | POA: Diagnosis not present

## 2019-01-29 DIAGNOSIS — M6281 Muscle weakness (generalized): Secondary | ICD-10-CM | POA: Diagnosis not present

## 2019-01-29 DIAGNOSIS — R293 Abnormal posture: Secondary | ICD-10-CM | POA: Diagnosis not present

## 2019-01-29 DIAGNOSIS — Z23 Encounter for immunization: Secondary | ICD-10-CM | POA: Diagnosis not present

## 2019-01-29 DIAGNOSIS — R278 Other lack of coordination: Secondary | ICD-10-CM | POA: Diagnosis not present

## 2019-02-01 DIAGNOSIS — M6281 Muscle weakness (generalized): Secondary | ICD-10-CM | POA: Diagnosis not present

## 2019-02-01 DIAGNOSIS — R293 Abnormal posture: Secondary | ICD-10-CM | POA: Diagnosis not present

## 2019-02-01 DIAGNOSIS — R278 Other lack of coordination: Secondary | ICD-10-CM | POA: Diagnosis not present

## 2019-02-09 DIAGNOSIS — F331 Major depressive disorder, recurrent, moderate: Secondary | ICD-10-CM | POA: Diagnosis not present

## 2019-02-09 DIAGNOSIS — F419 Anxiety disorder, unspecified: Secondary | ICD-10-CM | POA: Diagnosis not present

## 2019-02-09 DIAGNOSIS — R419 Unspecified symptoms and signs involving cognitive functions and awareness: Secondary | ICD-10-CM | POA: Diagnosis not present

## 2019-02-17 DIAGNOSIS — M79674 Pain in right toe(s): Secondary | ICD-10-CM | POA: Diagnosis not present

## 2019-02-17 DIAGNOSIS — B351 Tinea unguium: Secondary | ICD-10-CM | POA: Diagnosis not present

## 2019-02-17 DIAGNOSIS — M79675 Pain in left toe(s): Secondary | ICD-10-CM | POA: Diagnosis not present

## 2019-02-26 DIAGNOSIS — Z23 Encounter for immunization: Secondary | ICD-10-CM | POA: Diagnosis not present

## 2019-04-13 DIAGNOSIS — R419 Unspecified symptoms and signs involving cognitive functions and awareness: Secondary | ICD-10-CM | POA: Diagnosis not present

## 2019-04-13 DIAGNOSIS — F419 Anxiety disorder, unspecified: Secondary | ICD-10-CM | POA: Diagnosis not present

## 2019-04-13 DIAGNOSIS — F331 Major depressive disorder, recurrent, moderate: Secondary | ICD-10-CM | POA: Diagnosis not present

## 2019-05-11 DIAGNOSIS — F331 Major depressive disorder, recurrent, moderate: Secondary | ICD-10-CM | POA: Diagnosis not present

## 2019-05-11 DIAGNOSIS — R419 Unspecified symptoms and signs involving cognitive functions and awareness: Secondary | ICD-10-CM | POA: Diagnosis not present

## 2019-05-11 DIAGNOSIS — F419 Anxiety disorder, unspecified: Secondary | ICD-10-CM | POA: Diagnosis not present

## 2019-05-31 DIAGNOSIS — Z0000 Encounter for general adult medical examination without abnormal findings: Secondary | ICD-10-CM | POA: Diagnosis not present

## 2019-06-02 DIAGNOSIS — E569 Vitamin deficiency, unspecified: Secondary | ICD-10-CM | POA: Diagnosis not present

## 2019-06-02 DIAGNOSIS — D649 Anemia, unspecified: Secondary | ICD-10-CM | POA: Diagnosis not present

## 2019-06-02 DIAGNOSIS — E559 Vitamin D deficiency, unspecified: Secondary | ICD-10-CM | POA: Diagnosis not present

## 2019-06-02 DIAGNOSIS — E039 Hypothyroidism, unspecified: Secondary | ICD-10-CM | POA: Diagnosis not present

## 2019-06-02 DIAGNOSIS — E119 Type 2 diabetes mellitus without complications: Secondary | ICD-10-CM | POA: Diagnosis not present

## 2019-06-07 DIAGNOSIS — B351 Tinea unguium: Secondary | ICD-10-CM | POA: Diagnosis not present

## 2019-06-07 DIAGNOSIS — M79675 Pain in left toe(s): Secondary | ICD-10-CM | POA: Diagnosis not present

## 2019-06-07 DIAGNOSIS — M79674 Pain in right toe(s): Secondary | ICD-10-CM | POA: Diagnosis not present

## 2019-06-08 DIAGNOSIS — F419 Anxiety disorder, unspecified: Secondary | ICD-10-CM | POA: Diagnosis not present

## 2019-06-08 DIAGNOSIS — R419 Unspecified symptoms and signs involving cognitive functions and awareness: Secondary | ICD-10-CM | POA: Diagnosis not present

## 2019-06-08 DIAGNOSIS — F331 Major depressive disorder, recurrent, moderate: Secondary | ICD-10-CM | POA: Diagnosis not present

## 2019-08-10 DIAGNOSIS — R419 Unspecified symptoms and signs involving cognitive functions and awareness: Secondary | ICD-10-CM | POA: Diagnosis not present

## 2019-08-10 DIAGNOSIS — F419 Anxiety disorder, unspecified: Secondary | ICD-10-CM | POA: Diagnosis not present

## 2019-08-10 DIAGNOSIS — F331 Major depressive disorder, recurrent, moderate: Secondary | ICD-10-CM | POA: Diagnosis not present

## 2019-08-16 DIAGNOSIS — F0390 Unspecified dementia without behavioral disturbance: Secondary | ICD-10-CM | POA: Diagnosis not present

## 2019-08-16 DIAGNOSIS — M21619 Bunion of unspecified foot: Secondary | ICD-10-CM | POA: Diagnosis not present

## 2019-08-16 DIAGNOSIS — L84 Corns and callosities: Secondary | ICD-10-CM | POA: Diagnosis not present

## 2019-08-16 DIAGNOSIS — G301 Alzheimer's disease with late onset: Secondary | ICD-10-CM | POA: Diagnosis not present

## 2019-08-17 DIAGNOSIS — B351 Tinea unguium: Secondary | ICD-10-CM | POA: Diagnosis not present

## 2019-08-17 DIAGNOSIS — M79674 Pain in right toe(s): Secondary | ICD-10-CM | POA: Diagnosis not present

## 2019-08-17 DIAGNOSIS — M79675 Pain in left toe(s): Secondary | ICD-10-CM | POA: Diagnosis not present

## 2019-08-30 DIAGNOSIS — G4700 Insomnia, unspecified: Secondary | ICD-10-CM | POA: Diagnosis not present

## 2019-08-30 DIAGNOSIS — F459 Somatoform disorder, unspecified: Secondary | ICD-10-CM | POA: Diagnosis not present

## 2019-08-30 DIAGNOSIS — M810 Age-related osteoporosis without current pathological fracture: Secondary | ICD-10-CM | POA: Diagnosis not present

## 2019-08-30 DIAGNOSIS — G44329 Chronic post-traumatic headache, not intractable: Secondary | ICD-10-CM | POA: Diagnosis not present

## 2019-08-30 DIAGNOSIS — F411 Generalized anxiety disorder: Secondary | ICD-10-CM | POA: Diagnosis not present

## 2019-08-30 DIAGNOSIS — K219 Gastro-esophageal reflux disease without esophagitis: Secondary | ICD-10-CM | POA: Diagnosis not present

## 2019-08-30 DIAGNOSIS — M109 Gout, unspecified: Secondary | ICD-10-CM | POA: Diagnosis not present

## 2019-08-30 DIAGNOSIS — I051 Rheumatic mitral insufficiency: Secondary | ICD-10-CM | POA: Diagnosis not present

## 2019-08-30 DIAGNOSIS — D696 Thrombocytopenia, unspecified: Secondary | ICD-10-CM | POA: Diagnosis not present

## 2019-08-30 DIAGNOSIS — K5790 Diverticulosis of intestine, part unspecified, without perforation or abscess without bleeding: Secondary | ICD-10-CM | POA: Diagnosis not present

## 2019-08-30 DIAGNOSIS — M940 Chondrocostal junction syndrome [Tietze]: Secondary | ICD-10-CM | POA: Diagnosis not present

## 2019-08-30 DIAGNOSIS — K649 Unspecified hemorrhoids: Secondary | ICD-10-CM | POA: Diagnosis not present

## 2019-08-30 DIAGNOSIS — J410 Simple chronic bronchitis: Secondary | ICD-10-CM | POA: Diagnosis not present

## 2019-08-30 DIAGNOSIS — E785 Hyperlipidemia, unspecified: Secondary | ICD-10-CM | POA: Diagnosis not present

## 2019-08-30 DIAGNOSIS — F418 Other specified anxiety disorders: Secondary | ICD-10-CM | POA: Diagnosis not present

## 2019-08-30 DIAGNOSIS — E118 Type 2 diabetes mellitus with unspecified complications: Secondary | ICD-10-CM | POA: Diagnosis not present

## 2019-08-30 DIAGNOSIS — I10 Essential (primary) hypertension: Secondary | ICD-10-CM | POA: Diagnosis not present

## 2019-08-30 DIAGNOSIS — I2510 Atherosclerotic heart disease of native coronary artery without angina pectoris: Secondary | ICD-10-CM | POA: Diagnosis not present

## 2019-08-30 DIAGNOSIS — F22 Delusional disorders: Secondary | ICD-10-CM | POA: Diagnosis not present

## 2019-08-30 DIAGNOSIS — H9319 Tinnitus, unspecified ear: Secondary | ICD-10-CM | POA: Diagnosis not present

## 2019-08-30 DIAGNOSIS — K8020 Calculus of gallbladder without cholecystitis without obstruction: Secondary | ICD-10-CM | POA: Diagnosis not present

## 2019-08-30 DIAGNOSIS — Z7982 Long term (current) use of aspirin: Secondary | ICD-10-CM | POA: Diagnosis not present

## 2019-08-30 DIAGNOSIS — E119 Type 2 diabetes mellitus without complications: Secondary | ICD-10-CM | POA: Diagnosis not present

## 2019-08-30 DIAGNOSIS — L89892 Pressure ulcer of other site, stage 2: Secondary | ICD-10-CM | POA: Diagnosis not present

## 2019-08-30 DIAGNOSIS — F0391 Unspecified dementia with behavioral disturbance: Secondary | ICD-10-CM | POA: Diagnosis not present

## 2019-08-30 DIAGNOSIS — E041 Nontoxic single thyroid nodule: Secondary | ICD-10-CM | POA: Diagnosis not present

## 2019-09-03 DIAGNOSIS — L89892 Pressure ulcer of other site, stage 2: Secondary | ICD-10-CM | POA: Diagnosis not present

## 2019-09-03 DIAGNOSIS — I051 Rheumatic mitral insufficiency: Secondary | ICD-10-CM | POA: Diagnosis not present

## 2019-09-03 DIAGNOSIS — I2510 Atherosclerotic heart disease of native coronary artery without angina pectoris: Secondary | ICD-10-CM | POA: Diagnosis not present

## 2019-09-03 DIAGNOSIS — I10 Essential (primary) hypertension: Secondary | ICD-10-CM | POA: Diagnosis not present

## 2019-09-03 DIAGNOSIS — F0391 Unspecified dementia with behavioral disturbance: Secondary | ICD-10-CM | POA: Diagnosis not present

## 2019-09-03 DIAGNOSIS — E119 Type 2 diabetes mellitus without complications: Secondary | ICD-10-CM | POA: Diagnosis not present

## 2019-09-06 DIAGNOSIS — I2510 Atherosclerotic heart disease of native coronary artery without angina pectoris: Secondary | ICD-10-CM | POA: Diagnosis not present

## 2019-09-06 DIAGNOSIS — I10 Essential (primary) hypertension: Secondary | ICD-10-CM | POA: Diagnosis not present

## 2019-09-06 DIAGNOSIS — I051 Rheumatic mitral insufficiency: Secondary | ICD-10-CM | POA: Diagnosis not present

## 2019-09-06 DIAGNOSIS — L89892 Pressure ulcer of other site, stage 2: Secondary | ICD-10-CM | POA: Diagnosis not present

## 2019-09-06 DIAGNOSIS — F0391 Unspecified dementia with behavioral disturbance: Secondary | ICD-10-CM | POA: Diagnosis not present

## 2019-09-06 DIAGNOSIS — E119 Type 2 diabetes mellitus without complications: Secondary | ICD-10-CM | POA: Diagnosis not present

## 2019-09-09 DIAGNOSIS — F0391 Unspecified dementia with behavioral disturbance: Secondary | ICD-10-CM | POA: Diagnosis not present

## 2019-09-09 DIAGNOSIS — I2510 Atherosclerotic heart disease of native coronary artery without angina pectoris: Secondary | ICD-10-CM | POA: Diagnosis not present

## 2019-09-09 DIAGNOSIS — I10 Essential (primary) hypertension: Secondary | ICD-10-CM | POA: Diagnosis not present

## 2019-09-09 DIAGNOSIS — I051 Rheumatic mitral insufficiency: Secondary | ICD-10-CM | POA: Diagnosis not present

## 2019-09-09 DIAGNOSIS — E119 Type 2 diabetes mellitus without complications: Secondary | ICD-10-CM | POA: Diagnosis not present

## 2019-09-09 DIAGNOSIS — L89892 Pressure ulcer of other site, stage 2: Secondary | ICD-10-CM | POA: Diagnosis not present

## 2019-09-13 DIAGNOSIS — E119 Type 2 diabetes mellitus without complications: Secondary | ICD-10-CM | POA: Diagnosis not present

## 2019-09-13 DIAGNOSIS — I2510 Atherosclerotic heart disease of native coronary artery without angina pectoris: Secondary | ICD-10-CM | POA: Diagnosis not present

## 2019-09-13 DIAGNOSIS — L89892 Pressure ulcer of other site, stage 2: Secondary | ICD-10-CM | POA: Diagnosis not present

## 2019-09-13 DIAGNOSIS — I10 Essential (primary) hypertension: Secondary | ICD-10-CM | POA: Diagnosis not present

## 2019-09-13 DIAGNOSIS — I051 Rheumatic mitral insufficiency: Secondary | ICD-10-CM | POA: Diagnosis not present

## 2019-09-13 DIAGNOSIS — F0391 Unspecified dementia with behavioral disturbance: Secondary | ICD-10-CM | POA: Diagnosis not present

## 2019-09-14 DIAGNOSIS — F331 Major depressive disorder, recurrent, moderate: Secondary | ICD-10-CM | POA: Diagnosis not present

## 2019-09-14 DIAGNOSIS — F419 Anxiety disorder, unspecified: Secondary | ICD-10-CM | POA: Diagnosis not present

## 2019-09-14 DIAGNOSIS — R419 Unspecified symptoms and signs involving cognitive functions and awareness: Secondary | ICD-10-CM | POA: Diagnosis not present

## 2019-09-16 DIAGNOSIS — L89892 Pressure ulcer of other site, stage 2: Secondary | ICD-10-CM | POA: Diagnosis not present

## 2019-09-16 DIAGNOSIS — I2510 Atherosclerotic heart disease of native coronary artery without angina pectoris: Secondary | ICD-10-CM | POA: Diagnosis not present

## 2019-09-16 DIAGNOSIS — F0391 Unspecified dementia with behavioral disturbance: Secondary | ICD-10-CM | POA: Diagnosis not present

## 2019-09-16 DIAGNOSIS — E119 Type 2 diabetes mellitus without complications: Secondary | ICD-10-CM | POA: Diagnosis not present

## 2019-09-16 DIAGNOSIS — I10 Essential (primary) hypertension: Secondary | ICD-10-CM | POA: Diagnosis not present

## 2019-09-16 DIAGNOSIS — I051 Rheumatic mitral insufficiency: Secondary | ICD-10-CM | POA: Diagnosis not present

## 2019-09-20 DIAGNOSIS — E119 Type 2 diabetes mellitus without complications: Secondary | ICD-10-CM | POA: Diagnosis not present

## 2019-09-20 DIAGNOSIS — I10 Essential (primary) hypertension: Secondary | ICD-10-CM | POA: Diagnosis not present

## 2019-09-20 DIAGNOSIS — I051 Rheumatic mitral insufficiency: Secondary | ICD-10-CM | POA: Diagnosis not present

## 2019-09-20 DIAGNOSIS — L89892 Pressure ulcer of other site, stage 2: Secondary | ICD-10-CM | POA: Diagnosis not present

## 2019-09-20 DIAGNOSIS — F0391 Unspecified dementia with behavioral disturbance: Secondary | ICD-10-CM | POA: Diagnosis not present

## 2019-09-20 DIAGNOSIS — I2510 Atherosclerotic heart disease of native coronary artery without angina pectoris: Secondary | ICD-10-CM | POA: Diagnosis not present

## 2019-09-23 DIAGNOSIS — L89892 Pressure ulcer of other site, stage 2: Secondary | ICD-10-CM | POA: Diagnosis not present

## 2019-09-23 DIAGNOSIS — E119 Type 2 diabetes mellitus without complications: Secondary | ICD-10-CM | POA: Diagnosis not present

## 2019-09-23 DIAGNOSIS — I2510 Atherosclerotic heart disease of native coronary artery without angina pectoris: Secondary | ICD-10-CM | POA: Diagnosis not present

## 2019-09-23 DIAGNOSIS — I051 Rheumatic mitral insufficiency: Secondary | ICD-10-CM | POA: Diagnosis not present

## 2019-09-23 DIAGNOSIS — I10 Essential (primary) hypertension: Secondary | ICD-10-CM | POA: Diagnosis not present

## 2019-09-23 DIAGNOSIS — F0391 Unspecified dementia with behavioral disturbance: Secondary | ICD-10-CM | POA: Diagnosis not present

## 2019-09-27 DIAGNOSIS — I051 Rheumatic mitral insufficiency: Secondary | ICD-10-CM | POA: Diagnosis not present

## 2019-09-27 DIAGNOSIS — L89892 Pressure ulcer of other site, stage 2: Secondary | ICD-10-CM | POA: Diagnosis not present

## 2019-09-27 DIAGNOSIS — I10 Essential (primary) hypertension: Secondary | ICD-10-CM | POA: Diagnosis not present

## 2019-09-27 DIAGNOSIS — F0391 Unspecified dementia with behavioral disturbance: Secondary | ICD-10-CM | POA: Diagnosis not present

## 2019-09-27 DIAGNOSIS — E119 Type 2 diabetes mellitus without complications: Secondary | ICD-10-CM | POA: Diagnosis not present

## 2019-09-27 DIAGNOSIS — I2510 Atherosclerotic heart disease of native coronary artery without angina pectoris: Secondary | ICD-10-CM | POA: Diagnosis not present

## 2019-09-29 DIAGNOSIS — M109 Gout, unspecified: Secondary | ICD-10-CM | POA: Diagnosis not present

## 2019-09-29 DIAGNOSIS — L89892 Pressure ulcer of other site, stage 2: Secondary | ICD-10-CM | POA: Diagnosis not present

## 2019-09-29 DIAGNOSIS — F418 Other specified anxiety disorders: Secondary | ICD-10-CM | POA: Diagnosis not present

## 2019-09-29 DIAGNOSIS — I051 Rheumatic mitral insufficiency: Secondary | ICD-10-CM | POA: Diagnosis not present

## 2019-09-29 DIAGNOSIS — J410 Simple chronic bronchitis: Secondary | ICD-10-CM | POA: Diagnosis not present

## 2019-09-29 DIAGNOSIS — G4700 Insomnia, unspecified: Secondary | ICD-10-CM | POA: Diagnosis not present

## 2019-09-29 DIAGNOSIS — F459 Somatoform disorder, unspecified: Secondary | ICD-10-CM | POA: Diagnosis not present

## 2019-09-29 DIAGNOSIS — I2510 Atherosclerotic heart disease of native coronary artery without angina pectoris: Secondary | ICD-10-CM | POA: Diagnosis not present

## 2019-09-29 DIAGNOSIS — E785 Hyperlipidemia, unspecified: Secondary | ICD-10-CM | POA: Diagnosis not present

## 2019-09-29 DIAGNOSIS — E119 Type 2 diabetes mellitus without complications: Secondary | ICD-10-CM | POA: Diagnosis not present

## 2019-09-29 DIAGNOSIS — Z7982 Long term (current) use of aspirin: Secondary | ICD-10-CM | POA: Diagnosis not present

## 2019-09-29 DIAGNOSIS — E041 Nontoxic single thyroid nodule: Secondary | ICD-10-CM | POA: Diagnosis not present

## 2019-09-29 DIAGNOSIS — M810 Age-related osteoporosis without current pathological fracture: Secondary | ICD-10-CM | POA: Diagnosis not present

## 2019-09-29 DIAGNOSIS — F0391 Unspecified dementia with behavioral disturbance: Secondary | ICD-10-CM | POA: Diagnosis not present

## 2019-09-29 DIAGNOSIS — K219 Gastro-esophageal reflux disease without esophagitis: Secondary | ICD-10-CM | POA: Diagnosis not present

## 2019-09-29 DIAGNOSIS — M940 Chondrocostal junction syndrome [Tietze]: Secondary | ICD-10-CM | POA: Diagnosis not present

## 2019-09-29 DIAGNOSIS — I10 Essential (primary) hypertension: Secondary | ICD-10-CM | POA: Diagnosis not present

## 2019-09-29 DIAGNOSIS — K649 Unspecified hemorrhoids: Secondary | ICD-10-CM | POA: Diagnosis not present

## 2019-09-29 DIAGNOSIS — K5790 Diverticulosis of intestine, part unspecified, without perforation or abscess without bleeding: Secondary | ICD-10-CM | POA: Diagnosis not present

## 2019-09-29 DIAGNOSIS — F411 Generalized anxiety disorder: Secondary | ICD-10-CM | POA: Diagnosis not present

## 2019-09-29 DIAGNOSIS — D696 Thrombocytopenia, unspecified: Secondary | ICD-10-CM | POA: Diagnosis not present

## 2019-09-29 DIAGNOSIS — F22 Delusional disorders: Secondary | ICD-10-CM | POA: Diagnosis not present

## 2019-09-29 DIAGNOSIS — K8020 Calculus of gallbladder without cholecystitis without obstruction: Secondary | ICD-10-CM | POA: Diagnosis not present

## 2019-09-29 DIAGNOSIS — G44329 Chronic post-traumatic headache, not intractable: Secondary | ICD-10-CM | POA: Diagnosis not present

## 2019-09-29 DIAGNOSIS — H9319 Tinnitus, unspecified ear: Secondary | ICD-10-CM | POA: Diagnosis not present

## 2019-09-30 DIAGNOSIS — F0391 Unspecified dementia with behavioral disturbance: Secondary | ICD-10-CM | POA: Diagnosis not present

## 2019-09-30 DIAGNOSIS — L89892 Pressure ulcer of other site, stage 2: Secondary | ICD-10-CM | POA: Diagnosis not present

## 2019-09-30 DIAGNOSIS — I051 Rheumatic mitral insufficiency: Secondary | ICD-10-CM | POA: Diagnosis not present

## 2019-09-30 DIAGNOSIS — I10 Essential (primary) hypertension: Secondary | ICD-10-CM | POA: Diagnosis not present

## 2019-09-30 DIAGNOSIS — E119 Type 2 diabetes mellitus without complications: Secondary | ICD-10-CM | POA: Diagnosis not present

## 2019-09-30 DIAGNOSIS — I2510 Atherosclerotic heart disease of native coronary artery without angina pectoris: Secondary | ICD-10-CM | POA: Diagnosis not present

## 2019-10-04 DIAGNOSIS — E119 Type 2 diabetes mellitus without complications: Secondary | ICD-10-CM | POA: Diagnosis not present

## 2019-10-04 DIAGNOSIS — I051 Rheumatic mitral insufficiency: Secondary | ICD-10-CM | POA: Diagnosis not present

## 2019-10-04 DIAGNOSIS — I10 Essential (primary) hypertension: Secondary | ICD-10-CM | POA: Diagnosis not present

## 2019-10-04 DIAGNOSIS — L89892 Pressure ulcer of other site, stage 2: Secondary | ICD-10-CM | POA: Diagnosis not present

## 2019-10-04 DIAGNOSIS — I2510 Atherosclerotic heart disease of native coronary artery without angina pectoris: Secondary | ICD-10-CM | POA: Diagnosis not present

## 2019-10-04 DIAGNOSIS — F0391 Unspecified dementia with behavioral disturbance: Secondary | ICD-10-CM | POA: Diagnosis not present

## 2019-10-07 DIAGNOSIS — I2510 Atherosclerotic heart disease of native coronary artery without angina pectoris: Secondary | ICD-10-CM | POA: Diagnosis not present

## 2019-10-07 DIAGNOSIS — L89892 Pressure ulcer of other site, stage 2: Secondary | ICD-10-CM | POA: Diagnosis not present

## 2019-10-07 DIAGNOSIS — I10 Essential (primary) hypertension: Secondary | ICD-10-CM | POA: Diagnosis not present

## 2019-10-07 DIAGNOSIS — F0391 Unspecified dementia with behavioral disturbance: Secondary | ICD-10-CM | POA: Diagnosis not present

## 2019-10-07 DIAGNOSIS — E119 Type 2 diabetes mellitus without complications: Secondary | ICD-10-CM | POA: Diagnosis not present

## 2019-10-07 DIAGNOSIS — I051 Rheumatic mitral insufficiency: Secondary | ICD-10-CM | POA: Diagnosis not present

## 2019-10-07 DIAGNOSIS — Z23 Encounter for immunization: Secondary | ICD-10-CM | POA: Diagnosis not present

## 2019-10-11 DIAGNOSIS — I2510 Atherosclerotic heart disease of native coronary artery without angina pectoris: Secondary | ICD-10-CM | POA: Diagnosis not present

## 2019-10-11 DIAGNOSIS — F0391 Unspecified dementia with behavioral disturbance: Secondary | ICD-10-CM | POA: Diagnosis not present

## 2019-10-11 DIAGNOSIS — L89892 Pressure ulcer of other site, stage 2: Secondary | ICD-10-CM | POA: Diagnosis not present

## 2019-10-11 DIAGNOSIS — I10 Essential (primary) hypertension: Secondary | ICD-10-CM | POA: Diagnosis not present

## 2019-10-11 DIAGNOSIS — E119 Type 2 diabetes mellitus without complications: Secondary | ICD-10-CM | POA: Diagnosis not present

## 2019-10-11 DIAGNOSIS — I051 Rheumatic mitral insufficiency: Secondary | ICD-10-CM | POA: Diagnosis not present

## 2019-10-12 DIAGNOSIS — R419 Unspecified symptoms and signs involving cognitive functions and awareness: Secondary | ICD-10-CM | POA: Diagnosis not present

## 2019-10-12 DIAGNOSIS — F331 Major depressive disorder, recurrent, moderate: Secondary | ICD-10-CM | POA: Diagnosis not present

## 2019-10-12 DIAGNOSIS — F419 Anxiety disorder, unspecified: Secondary | ICD-10-CM | POA: Diagnosis not present

## 2019-10-14 DIAGNOSIS — I2510 Atherosclerotic heart disease of native coronary artery without angina pectoris: Secondary | ICD-10-CM | POA: Diagnosis not present

## 2019-10-14 DIAGNOSIS — F0391 Unspecified dementia with behavioral disturbance: Secondary | ICD-10-CM | POA: Diagnosis not present

## 2019-10-14 DIAGNOSIS — E119 Type 2 diabetes mellitus without complications: Secondary | ICD-10-CM | POA: Diagnosis not present

## 2019-10-14 DIAGNOSIS — I051 Rheumatic mitral insufficiency: Secondary | ICD-10-CM | POA: Diagnosis not present

## 2019-10-14 DIAGNOSIS — L89892 Pressure ulcer of other site, stage 2: Secondary | ICD-10-CM | POA: Diagnosis not present

## 2019-10-14 DIAGNOSIS — I10 Essential (primary) hypertension: Secondary | ICD-10-CM | POA: Diagnosis not present

## 2019-10-17 DIAGNOSIS — L89892 Pressure ulcer of other site, stage 2: Secondary | ICD-10-CM | POA: Diagnosis not present

## 2019-10-17 DIAGNOSIS — I10 Essential (primary) hypertension: Secondary | ICD-10-CM | POA: Diagnosis not present

## 2019-10-17 DIAGNOSIS — E119 Type 2 diabetes mellitus without complications: Secondary | ICD-10-CM | POA: Diagnosis not present

## 2019-10-17 DIAGNOSIS — I2510 Atherosclerotic heart disease of native coronary artery without angina pectoris: Secondary | ICD-10-CM | POA: Diagnosis not present

## 2019-10-17 DIAGNOSIS — F0391 Unspecified dementia with behavioral disturbance: Secondary | ICD-10-CM | POA: Diagnosis not present

## 2019-10-17 DIAGNOSIS — I051 Rheumatic mitral insufficiency: Secondary | ICD-10-CM | POA: Diagnosis not present

## 2019-10-19 DIAGNOSIS — Z20828 Contact with and (suspected) exposure to other viral communicable diseases: Secondary | ICD-10-CM | POA: Diagnosis not present

## 2019-10-28 DIAGNOSIS — Z20828 Contact with and (suspected) exposure to other viral communicable diseases: Secondary | ICD-10-CM | POA: Diagnosis not present

## 2019-11-09 DIAGNOSIS — R419 Unspecified symptoms and signs involving cognitive functions and awareness: Secondary | ICD-10-CM | POA: Diagnosis not present

## 2019-11-09 DIAGNOSIS — F331 Major depressive disorder, recurrent, moderate: Secondary | ICD-10-CM | POA: Diagnosis not present

## 2019-11-09 DIAGNOSIS — F419 Anxiety disorder, unspecified: Secondary | ICD-10-CM | POA: Diagnosis not present

## 2019-12-14 DIAGNOSIS — R419 Unspecified symptoms and signs involving cognitive functions and awareness: Secondary | ICD-10-CM | POA: Diagnosis not present

## 2019-12-14 DIAGNOSIS — F419 Anxiety disorder, unspecified: Secondary | ICD-10-CM | POA: Diagnosis not present

## 2019-12-14 DIAGNOSIS — F331 Major depressive disorder, recurrent, moderate: Secondary | ICD-10-CM | POA: Diagnosis not present

## 2020-01-03 DIAGNOSIS — E118 Type 2 diabetes mellitus with unspecified complications: Secondary | ICD-10-CM | POA: Diagnosis not present

## 2020-01-03 DIAGNOSIS — F0391 Unspecified dementia with behavioral disturbance: Secondary | ICD-10-CM | POA: Diagnosis not present

## 2020-01-03 DIAGNOSIS — R159 Full incontinence of feces: Secondary | ICD-10-CM | POA: Diagnosis not present

## 2020-01-03 DIAGNOSIS — Z23 Encounter for immunization: Secondary | ICD-10-CM | POA: Diagnosis not present

## 2020-01-03 DIAGNOSIS — I10 Essential (primary) hypertension: Secondary | ICD-10-CM | POA: Diagnosis not present

## 2020-01-03 DIAGNOSIS — R3981 Functional urinary incontinence: Secondary | ICD-10-CM | POA: Diagnosis not present

## 2020-01-11 DIAGNOSIS — R419 Unspecified symptoms and signs involving cognitive functions and awareness: Secondary | ICD-10-CM | POA: Diagnosis not present

## 2020-01-11 DIAGNOSIS — F419 Anxiety disorder, unspecified: Secondary | ICD-10-CM | POA: Diagnosis not present

## 2020-01-11 DIAGNOSIS — F331 Major depressive disorder, recurrent, moderate: Secondary | ICD-10-CM | POA: Diagnosis not present

## 2020-01-25 ENCOUNTER — Encounter (HOSPITAL_COMMUNITY): Payer: Self-pay | Admitting: Emergency Medicine

## 2020-01-25 ENCOUNTER — Emergency Department (HOSPITAL_COMMUNITY)
Admission: EM | Admit: 2020-01-25 | Discharge: 2020-01-26 | Disposition: A | Discharge status: Home / Self Care | Payer: Medicare Other | Attending: Emergency Medicine | Admitting: Emergency Medicine

## 2020-01-25 ENCOUNTER — Emergency Department (HOSPITAL_COMMUNITY): Payer: Medicare Other

## 2020-01-25 DIAGNOSIS — Z7982 Long term (current) use of aspirin: Secondary | ICD-10-CM | POA: Diagnosis not present

## 2020-01-25 DIAGNOSIS — Z79899 Other long term (current) drug therapy: Secondary | ICD-10-CM | POA: Diagnosis not present

## 2020-01-25 DIAGNOSIS — F0391 Unspecified dementia with behavioral disturbance: Secondary | ICD-10-CM | POA: Insufficient documentation

## 2020-01-25 DIAGNOSIS — Z7984 Long term (current) use of oral hypoglycemic drugs: Secondary | ICD-10-CM | POA: Insufficient documentation

## 2020-01-25 DIAGNOSIS — W19XXXA Unspecified fall, initial encounter: Secondary | ICD-10-CM | POA: Insufficient documentation

## 2020-01-25 DIAGNOSIS — S0121XA Laceration without foreign body of nose, initial encounter: Secondary | ICD-10-CM | POA: Insufficient documentation

## 2020-01-25 DIAGNOSIS — Z955 Presence of coronary angioplasty implant and graft: Secondary | ICD-10-CM | POA: Insufficient documentation

## 2020-01-25 DIAGNOSIS — I10 Essential (primary) hypertension: Secondary | ICD-10-CM | POA: Insufficient documentation

## 2020-01-25 DIAGNOSIS — Y92019 Unspecified place in single-family (private) house as the place of occurrence of the external cause: Secondary | ICD-10-CM | POA: Insufficient documentation

## 2020-01-25 DIAGNOSIS — S0993XA Unspecified injury of face, initial encounter: Secondary | ICD-10-CM | POA: Diagnosis present

## 2020-01-25 DIAGNOSIS — Z23 Encounter for immunization: Secondary | ICD-10-CM | POA: Insufficient documentation

## 2020-01-25 DIAGNOSIS — E119 Type 2 diabetes mellitus without complications: Secondary | ICD-10-CM | POA: Insufficient documentation

## 2020-01-25 DIAGNOSIS — S0181XA Laceration without foreign body of other part of head, initial encounter: Secondary | ICD-10-CM

## 2020-01-25 MED ORDER — TETANUS-DIPHTH-ACELL PERTUSSIS 5-2.5-18.5 LF-MCG/0.5 IM SUSY
0.5000 mL | PREFILLED_SYRINGE | Freq: Once | INTRAMUSCULAR | Status: AC
  Administered 2020-01-25: 0.5 mL via INTRAMUSCULAR
  Filled 2020-01-25: qty 0.5

## 2020-01-25 MED ORDER — LIDOCAINE HCL (PF) 1 % IJ SOLN
5.0000 mL | Freq: Once | INTRAMUSCULAR | Status: DC
  Filled 2020-01-25: qty 30

## 2020-01-25 NOTE — Discharge Instructions (Addendum)
You were seen here after a fall.  Imaging all looks reassuring.  I have placed 2 sutures in your nose.  Please abstain from getting the wound wet for the first 24 hours.  After that would like you to run cool clean water over it and change  dressings twice a day.  You may take over-the-counter pain medications like ibuprofen and Tylenol every 6 hours as needed.  the sutures will need to be removed in  5 days, you may come back to the emergency room, urgent care, or your PCP.  Come back to the emergency department if you develop chest pain, shortness of breath, severe abdominal pain, uncontrolled nausea, vomiting, diarrhea.

## 2020-01-25 NOTE — ED Notes (Signed)
Phone report given to [place], at [facility].

## 2020-01-25 NOTE — ED Provider Notes (Signed)
[facility] DEPT Provider Note   CSN: [id number] Arrival date & time: 01/25/20  1556     History Chief Complaint  Patient presents with  . Fall    Alice Wong is a 86 y.o. female.  HPI HPI we will deferred due to level 5 caveat dementia.    Patient with significant medical history of advanced dementia, hypertension presents to the emergency department after having a mechanical fall.  HPI was collected from caregiver and patient's daughter.  Spoke with caregiver from nursing facility who states that patient had an unwitnessed fall, but patient walked down to the front desk and point to her face where they noted she had a laceration and sent her to the emergency department to be evaluated.  She is known to have falls as this is common for her.  Caregiver  states patient has been acting normal, no recent change in her medications, she is not on blood thinners has no other complaints at this time.    Spoke with patient's daughter who informed that patient has advanced dementia and is nonverbal.  She endorses that patient has been acting her normal self, denies  recent medical changes, is not on abticoags  Past Medical History:  Diagnosis Date  . Anxiety   . Cholelithiasis   . CVA (cerebral vascular accident) ([place])   . Dementia ([place])   . Depression   . Diverticulosis   . GERD (gastroesophageal reflux disease)   . Gout 2009  . H. pylori infection 2011  . Hyperlipidemia   . Hypertension   . Memory loss   . MR (mitral regurgitation)   . Osteoporosis   . S/P cardiac cath 12/20/13 12/20/2013  . Thrombocytopenia ([place])   . Thyroid cyst   . Vaso-vagal reaction   . Vitamin D deficiency     Patient Active Problem List   Diagnosis Date Noted  . Agitation 12/09/2017  . Paranoia ([place])   . Incontinence of bowel 08/15/2017  . Chest wall contusion 02/21/2017  . Diarrhea 09/22/2015  . Redness of eye, right 02/15/2015  . Noncompliance with medication  regimen 11/15/2014  . Dementia with behavioral disturbance ([place]) 07/28/2014  . Cerebral infarction ([place]) 07/21/2014  . Sternum pain 04/21/2014  . Costochondritis 04/13/2014  . Simple chronic bronchitis ([place]) 04/13/2014  . Cough 03/22/2014  . S/P cardiac cath 12/20/13, non obsteructive disease 12/20/2013  . Abnormal nuclear stress test, false positive per cath   . Vasovagal syncope 12/17/2013  . Midsternal chest pain 12/17/2013  . Neoplasm of uncertain behavior of skin 10/27/2013  . Uncontrolled type 2 diabetes with renal manifestation ([place]) 11/04/2012  . Pain of right thumb 12/20/2011  . Psychosomatic disorder 08/23/2011  . Tinnitus 06/04/2011  . Headache, post-traumatic, chronic 05/01/2011  . LBP (low back pain) 01/30/2011  . Insomnia 11/19/2010  . Actinic keratoses 05/23/2010  . Dizziness 04/09/2010  . Bruising 04/09/2010  . Thrombocytopenia ([place]) 01/02/2010  . HEMORRHOIDS 12/12/2009  . HELICOBACTER PYLORI INFECTION 03/17/2009  . PARESTHESIA 03/17/2009  . Chest pain, unspecified 08/01/2008  . Vitamin D deficiency 03/03/2008  . GAIT IMBALANCE 10/22/2007  . Gout, unspecified 05/07/2007  . FOOT PAIN 05/07/2007  . Dyslipidemia 02/05/2007  . Anxiety state 02/05/2007  . Depression with anxiety 02/05/2007  . Essential hypertension 02/05/2007  . GERD 02/05/2007  . OSTEOPOROSIS 02/05/2007  . Memory loss 02/05/2007  . History of cardiovascular disorder 02/05/2007    Past Surgical History:  Procedure Laterality Date  . CESAREAN SECTION    .  CHOLECYSTECTOMY  2010  . LASIK    . LEFT HEART CATHETERIZATION WITH CORONARY ANGIOGRAM N/A 12/20/2013   Procedure: LEFT HEART CATHETERIZATION WITH CORONARY ANGIOGRAM;  Surgeon: Burnell Blanks, MD;  Location: [facility] CATH LAB;  Service: Cardiovascular;  Laterality: N/A;     OB History   No obstetric history on file.     Family History  Problem Relation Age of Onset  . Lung disease Father        Deceased  . Hypertension Other   .  Stomach cancer Brother   . Ulcers Son   . Colon cancer Neg Hx     Social History   Tobacco Use  . Smoking status: Never Smoker  . Smokeless tobacco: Never Used  Vaping Use  . Vaping Use: Never used  Substance Use Topics  . Alcohol use: No    Alcohol/week: 0.0 standard drinks  . Drug use: No    Types: Methylphenidate    Home Medications Prior to Admission medications   Medication Sig Start Date End Date Taking? Authorizing Provider  acetaminophen (TYLENOL) 500 MG tablet Take 500 mg by mouth every 4 (four) hours as needed for mild pain.    [provider]  amLODipine (NORVASC) 5 MG tablet TAKE 1 TABLET BY MOUTH EVERY DAY 09/16/17   Plotnikov, Evie Lacks, MD  aspirin EC 81 MG EC tablet Take 1 tablet (81 mg total) by mouth daily. 12/20/13   Isaiah Serge, NP  Cholecalciferol (D3-50) 1.25 MG (50000 UT) capsule Take 1 capsule (50,000 Units total) by mouth every 30 (thirty) days. 11/25/17   Plotnikov, Evie Lacks, MD  DULoxetine (CYMBALTA) 60 MG capsule Take 1 capsule (60 mg total) by mouth daily. 02/14/18 02/14/19  Arfeen, Arlyce Harman, MD  guaifenesin (ROBITUSSIN) 100 MG/5ML syrup Take 200 mg by mouth 4 (four) times daily as needed for cough.    [provider]  haloperidol (HALDOL) 0.5 MG tablet Take 0.5 mg by mouth 3 (three) times daily. 09/03/18   [provider]  LORazepam (ATIVAN) 2 MG tablet Take 1 tablet (2 mg total) by mouth every 6 (six) hours as needed for anxiety, sedation or sleep (agitation). 12/09/17   Plotnikov, Evie Lacks, MD  memantine (NAMENDA) 10 MG tablet TAKE 1 TABLET BY MOUTH TWICE A DAY 10/03/17   Plotnikov, Evie Lacks, MD  metFORMIN (GLUCOPHAGE) 500 MG tablet TAKE 1 TABLET BY MOUTH TWICE A DAY WITH MEALS 10/01/17   Plotnikov, Evie Lacks, MD  nitroGLYCERIN (NITROSTAT) 0.4 MG SL tablet DISSOLVE ONE TABLET UNDER THE TONGUE EVERY 5 MINUTES AS NEEDED FOR CHEST PAIN Patient taking differently: Place 0.4 mg under the tongue every 5 (five) minutes as needed for  chest pain.  08/23/16   Plotnikov, Evie Lacks, MD  OLANZapine (ZYPREXA) 10 MG tablet Take 1 tablet (10 mg total) by mouth at bedtime. 02/14/18   Arfeen, Arlyce Harman, MD  traMADol (ULTRAM) 50 MG tablet Take 50 mg by mouth 2 (two) times daily. 10/15/18   [provider]  traZODone (DESYREL) 100 MG tablet Take 1 tablet (100 mg total) by mouth at bedtime. 02/14/18   Kathlee Nations, MD    Allergies    Aricept Reather Littler hcl], Coreg [carvedilol], and Morphine sulfate  Review of Systems   Review of Systems  Unable to perform ROS: Dementia    Physical Exam Updated Vital Signs BP (!) 183/85 (BP Location: Left Arm)   Pulse 80   Temp 98.6 F (37 C) (Oral)   Resp 16  SpO2 100%   Physical Exam Vitals and nursing note reviewed.  Constitutional:      General: She is not in acute distress.    Appearance: She is not ill-appearing.     Comments: Patient appears to be in a Deconditioned state  HENT:     Head: Normocephalic and atraumatic.     Comments: Patient has a noted laceration on the bridge of her nose, measuring approximately 3 cm in length, approximately 1 cm in depth.  Hemodynamically stable, no surrounding erythema noted.    Nose: No congestion.     Comments: No nasal congestion or nasal bleeding noted, nasal paasage appear to be patent at this time. Eyes:     Conjunctiva/sclera: Conjunctivae normal.  Cardiovascular:     Rate and Rhythm: Normal rate and regular rhythm.     Pulses: Normal pulses.     Heart sounds: No murmur heard. No friction rub. No gallop.   Pulmonary:     Effort: No respiratory distress.     Breath sounds: No wheezing, rhonchi or rales.  Abdominal:     Palpations: Abdomen is soft.     Tenderness: There is no abdominal tenderness.  Musculoskeletal:     Comments: Patient spine was palpated nontender to palpation, no deformities or step-off noted.  Patient is moving all 4 extremities out difficulty.  Skin:    General: Skin is warm and dry.     Findings: No  rash.     Comments: Limited skin exam was performed there is no noted ecchymosis, abrasions, lacerations or other gross abnormalities  noted on patient's upper lower extremities, abdomen or back  Neurological:     Mental Status: She is alert. Mental status is at baseline.  Psychiatric:        Mood and Affect: Mood normal.     ED Results / Procedures / Treatments   Labs (all labs ordered are listed, but only abnormal results are displayed) Labs Reviewed - No data to display  EKG None  Radiology CT Head Wo Contrast  Result Date: 01/25/2020 CLINICAL DATA:  Unwitnessed fall. EXAM: CT HEAD WITHOUT CONTRAST CT MAXILLOFACIAL WITHOUT CONTRAST CT CERVICAL SPINE WITHOUT CONTRAST TECHNIQUE: Multidetector CT imaging of the head, cervical spine, and maxillofacial structures were performed using the standard protocol without intravenous contrast. Multiplanar CT image reconstructions of the cervical spine and maxillofacial structures were also generated. COMPARISON:  CT head 03/11/2018 FINDINGS: CT HEAD FINDINGS Brain: Generalized atrophy with progression. Patchy white matter hypodensity bilaterally with progression. Negative for acute infarct, hemorrhage, mass. Vascular: Negative for hyperdense vessel Skull: Negative Other: None CT MAXILLOFACIAL FINDINGS Osseous: Negative for facial fracture Orbits: Bilateral cataract extraction.  No orbital mass or edema. Sinuses: Mucosal edema throughout the paranasal sinuses most severe in the left maxillary sinus Soft tissues: Soft tissue swelling over the right maxilla due to contusion. CT CERVICAL SPINE FINDINGS Alignment: Mild retrolisthesis C3-4.  Mild anterolisthesis C5-6. Skull base and vertebrae: Negative for fracture Soft tissues and spinal canal: 13 mm left thyroid nodule. No further imaging necessary. No adenopathy or mass in the neck. Disc levels: Right-sided facet degeneration C2-3 without stenosis. Left-sided facet degeneration C3-4 without stenosis. Ankylosis  of the left C4-5 facet. Degenerative change in the left C5-6 and C6-7 facets. Moderate disc degeneration and spurring at C5-6 without significant stenosis. Upper chest: Lung apices clear bilaterally Other: None IMPRESSION: 1. No acute intracranial abnormality. Progression of atrophy and chronic microvascular ischemia since CT of 03/11/2018 2. Negative for facial fracture 3. Negative  for cervical spine fracture. Electronically Signed   By: Franchot Gallo M.D.   On: 01/25/2020 19:11   CT Cervical Spine Wo Contrast  Result Date: 01/25/2020 CLINICAL DATA:  Unwitnessed fall. EXAM: CT HEAD WITHOUT CONTRAST CT MAXILLOFACIAL WITHOUT CONTRAST CT CERVICAL SPINE WITHOUT CONTRAST TECHNIQUE: Multidetector CT imaging of the head, cervical spine, and maxillofacial structures were performed using the standard protocol without intravenous contrast. Multiplanar CT image reconstructions of the cervical spine and maxillofacial structures were also generated. COMPARISON:  CT head 03/11/2018 FINDINGS: CT HEAD FINDINGS Brain: Generalized atrophy with progression. Patchy white matter hypodensity bilaterally with progression. Negative for acute infarct, hemorrhage, mass. Vascular: Negative for hyperdense vessel Skull: Negative Other: None CT MAXILLOFACIAL FINDINGS Osseous: Negative for facial fracture Orbits: Bilateral cataract extraction.  No orbital mass or edema. Sinuses: Mucosal edema throughout the paranasal sinuses most severe in the left maxillary sinus Soft tissues: Soft tissue swelling over the right maxilla due to contusion. CT CERVICAL SPINE FINDINGS Alignment: Mild retrolisthesis C3-4.  Mild anterolisthesis C5-6. Skull base and vertebrae: Negative for fracture Soft tissues and spinal canal: 13 mm left thyroid nodule. No further imaging necessary. No adenopathy or mass in the neck. Disc levels: Right-sided facet degeneration C2-3 without stenosis. Left-sided facet degeneration C3-4 without stenosis. Ankylosis of the left C4-5  facet. Degenerative change in the left C5-6 and C6-7 facets. Moderate disc degeneration and spurring at C5-6 without significant stenosis. Upper chest: Lung apices clear bilaterally Other: None IMPRESSION: 1. No acute intracranial abnormality. Progression of atrophy and chronic microvascular ischemia since CT of 03/11/2018 2. Negative for facial fracture 3. Negative for cervical spine fracture. Electronically Signed   By: Franchot Gallo M.D.   On: 01/25/2020 19:11   CT Maxillofacial WO CM  Result Date: 01/25/2020 CLINICAL DATA:  Unwitnessed fall. EXAM: CT HEAD WITHOUT CONTRAST CT MAXILLOFACIAL WITHOUT CONTRAST CT CERVICAL SPINE WITHOUT CONTRAST TECHNIQUE: Multidetector CT imaging of the head, cervical spine, and maxillofacial structures were performed using the standard protocol without intravenous contrast. Multiplanar CT image reconstructions of the cervical spine and maxillofacial structures were also generated. COMPARISON:  CT head 03/11/2018 FINDINGS: CT HEAD FINDINGS Brain: Generalized atrophy with progression. Patchy white matter hypodensity bilaterally with progression. Negative for acute infarct, hemorrhage, mass. Vascular: Negative for hyperdense vessel Skull: Negative Other: None CT MAXILLOFACIAL FINDINGS Osseous: Negative for facial fracture Orbits: Bilateral cataract extraction.  No orbital mass or edema. Sinuses: Mucosal edema throughout the paranasal sinuses most severe in the left maxillary sinus Soft tissues: Soft tissue swelling over the right maxilla due to contusion. CT CERVICAL SPINE FINDINGS Alignment: Mild retrolisthesis C3-4.  Mild anterolisthesis C5-6. Skull base and vertebrae: Negative for fracture Soft tissues and spinal canal: 13 mm left thyroid nodule. No further imaging necessary. No adenopathy or mass in the neck. Disc levels: Right-sided facet degeneration C2-3 without stenosis. Left-sided facet degeneration C3-4 without stenosis. Ankylosis of the left C4-5 facet. Degenerative  change in the left C5-6 and C6-7 facets. Moderate disc degeneration and spurring at C5-6 without significant stenosis. Upper chest: Lung apices clear bilaterally Other: None IMPRESSION: 1. No acute intracranial abnormality. Progression of atrophy and chronic microvascular ischemia since CT of 03/11/2018 2. Negative for facial fracture 3. Negative for cervical spine fracture. Electronically Signed   By: Franchot Gallo M.D.   On: 01/25/2020 19:11    Procedures .Marland KitchenLaceration Repair  Date/Time: 01/25/2020 7:58 PM Performed by: Marcello Fennel, PA-C Authorized by: Marcello Fennel, PA-C   Consent:    Consent obtained:  Verbal  Consent given by:  Guardian and healthcare agent   Risks discussed:  Infection, pain, retained foreign body, need for additional repair, poor cosmetic result, tendon damage, vascular damage, poor wound healing and nerve damage   Alternatives discussed:  No treatment Anesthesia:    Anesthesia method:  None Laceration details:    Location:  Face   Face location:  Nose   Length (cm):  4   Depth (mm):  1 Pre-procedure details:    Preparation:  Patient was prepped and draped in usual sterile fashion and imaging obtained to evaluate for foreign bodies Exploration:    Wound exploration: wound explored through full range of motion     Contaminated: no   Treatment:    Area cleansed with:  Betadine and saline   Amount of cleaning:  Standard   Irrigation method:  Syringe   Visualized foreign bodies/material removed: no     Debridement:  None Skin repair:    Repair method:  Sutures   Suture size:  6-0   Suture material:  Prolene   Number of sutures:  2 Approximation:    Approximation:  Close Post-procedure details:    Dressing:  Non-adherent dressing   Procedure completion:  Tolerated well, no immediate complications   (including critical care time)  Medications Ordered in ED Medications  lidocaine (PF) (XYLOCAINE) 1 % injection 5 mL (has no administration  in time range)  Tdap (BOOSTRIX) injection 0.5 mL (0.5 mLs Intramuscular Given 01/25/20 2001)    ED Course  I have reviewed the triage vital signs and the nursing notes.  Pertinent labs & imaging results that were available during my care of the patient were reviewed by me and considered in my medical decision making (see chart for details).    MDM Rules/Calculators/A&P                          Patient presents after a mechanical fall.  She is alert, does not appear in acute distress, vital signs reassuring.  Due to mechanism of injury and trauma to face will order imaging of head and neck.  Will attempt to close wound with sutures.  Will update patient's tetanus shot.  Imaging was negative for acute findings.  Will recommend suturing to decrease infection risk and to assist with the healing process.  Due to patient's cognitive state consent was obtained from patient's daughter.  Local infiltrate was deferred as this would cause patient more harm than benefit.  Patient Tolerated the procedure well. She received 2 sutures.   I have low suspicion for intracranial head bleed as there is no neurodeficits noted on my exam, imaging was negative.  Low suspicion for facial or cranial fracture as imaging does not reveal any acute findings.  Low suspicion for systemic infection as patient is nontoxic-appearing, vital signs reassuring no obvious source infection on my exam.  Suspect patient had a mechanical fall.  Will recommend basic wound care, follow-up with PCP in 5 days time for suture removal.  Vital signs have remained stable, no indication for hospital admission.  Patient discussed with attending and they agreed with assessment and plan.  Patient given at home care as well strict return precautions.  Patient verbalized that they understood agreed to said plan.    Final Clinical Impression(s) / ED Diagnoses Final diagnoses:  Fall, initial encounter  Facial laceration, initial encounter    Rx  / DC Orders ED Discharge Orders    None  Aron Baba 01/25/20 2011    Lajean Saver, MD 01/25/20 2153

## 2020-01-25 NOTE — ED Notes (Signed)
[facility] called for transportation  

## 2020-01-25 NOTE — ED Notes (Signed)
Pt found sitting on side of bed with legs through side railings. Pt was repositioned, bed alarm placed and on, and laying in bed. Pt found again with legs through bed railings appoximately 5 minutes later. Pt repositioned and seizure pads applied to bed to prevent pt from injuring herself.

## 2020-01-25 NOTE — ED Triage Notes (Signed)
Per EMS-coming from [facility] witnessed fall-no LOC-patient ambulated to nursing desk and let me know of incident-no blood thinners-speaks Russian-lack above nose

## 2020-02-09 ENCOUNTER — Encounter (HOSPITAL_COMMUNITY): Payer: Self-pay

## 2020-02-09 ENCOUNTER — Emergency Department (HOSPITAL_COMMUNITY): Payer: Medicare Other

## 2020-02-09 ENCOUNTER — Inpatient Hospital Stay (HOSPITAL_COMMUNITY)
Admission: EM | Admit: 2020-02-09 | Discharge: 2020-02-12 | DRG: 481 | Disposition: A | Discharge status: Skilled Nursing Facility | Payer: Medicare Other | Attending: Internal Medicine | Admitting: Internal Medicine

## 2020-02-09 ENCOUNTER — Other Ambulatory Visit: Payer: Self-pay

## 2020-02-09 DIAGNOSIS — S72009A Fracture of unspecified part of neck of unspecified femur, initial encounter for closed fracture: Secondary | ICD-10-CM | POA: Diagnosis present

## 2020-02-09 DIAGNOSIS — Z7982 Long term (current) use of aspirin: Secondary | ICD-10-CM

## 2020-02-09 DIAGNOSIS — Z8781 Personal history of (healed) traumatic fracture: Secondary | ICD-10-CM

## 2020-02-09 DIAGNOSIS — Z8249 Family history of ischemic heart disease and other diseases of the circulatory system: Secondary | ICD-10-CM | POA: Diagnosis not present

## 2020-02-09 DIAGNOSIS — E785 Hyperlipidemia, unspecified: Secondary | ICD-10-CM | POA: Diagnosis present

## 2020-02-09 DIAGNOSIS — Z9181 History of falling: Secondary | ICD-10-CM

## 2020-02-09 DIAGNOSIS — F419 Anxiety disorder, unspecified: Secondary | ICD-10-CM | POA: Diagnosis present

## 2020-02-09 DIAGNOSIS — S72001A Fracture of unspecified part of neck of right femur, initial encounter for closed fracture: Secondary | ICD-10-CM

## 2020-02-09 DIAGNOSIS — W19XXXA Unspecified fall, initial encounter: Secondary | ICD-10-CM | POA: Diagnosis present

## 2020-02-09 DIAGNOSIS — K219 Gastro-esophageal reflux disease without esophagitis: Secondary | ICD-10-CM | POA: Diagnosis present

## 2020-02-09 DIAGNOSIS — Z79899 Other long term (current) drug therapy: Secondary | ICD-10-CM | POA: Diagnosis not present

## 2020-02-09 DIAGNOSIS — F0630 Mood disorder due to known physiological condition, unspecified: Secondary | ICD-10-CM

## 2020-02-09 DIAGNOSIS — I340 Nonrheumatic mitral (valve) insufficiency: Secondary | ICD-10-CM | POA: Diagnosis present

## 2020-02-09 DIAGNOSIS — M810 Age-related osteoporosis without current pathological fracture: Secondary | ICD-10-CM | POA: Diagnosis present

## 2020-02-09 DIAGNOSIS — F0391 Unspecified dementia with behavioral disturbance: Secondary | ICD-10-CM | POA: Diagnosis present

## 2020-02-09 DIAGNOSIS — S72141A Displaced intertrochanteric fracture of right femur, initial encounter for closed fracture: Secondary | ICD-10-CM | POA: Diagnosis present

## 2020-02-09 DIAGNOSIS — Z885 Allergy status to narcotic agent status: Secondary | ICD-10-CM | POA: Diagnosis not present

## 2020-02-09 DIAGNOSIS — I10 Essential (primary) hypertension: Secondary | ICD-10-CM | POA: Diagnosis present

## 2020-02-09 DIAGNOSIS — Z888 Allergy status to other drugs, medicaments and biological substances status: Secondary | ICD-10-CM | POA: Diagnosis not present

## 2020-02-09 DIAGNOSIS — F32A Depression, unspecified: Secondary | ICD-10-CM | POA: Diagnosis present

## 2020-02-09 DIAGNOSIS — Z20822 Contact with and (suspected) exposure to covid-19: Secondary | ICD-10-CM | POA: Diagnosis present

## 2020-02-09 DIAGNOSIS — Z9889 Other specified postprocedural states: Secondary | ICD-10-CM

## 2020-02-09 DIAGNOSIS — Z8673 Personal history of transient ischemic attack (TIA), and cerebral infarction without residual deficits: Secondary | ICD-10-CM

## 2020-02-09 DIAGNOSIS — Z419 Encounter for procedure for purposes other than remedying health state, unspecified: Secondary | ICD-10-CM

## 2020-02-09 DIAGNOSIS — F29 Unspecified psychosis not due to a substance or known physiological condition: Secondary | ICD-10-CM

## 2020-02-09 LAB — SARS CORONAVIRUS 2 BY RT PCR (HOSPITAL ORDER, PERFORMED IN ~~LOC~~ HOSPITAL LAB): SARS Coronavirus 2: NEGATIVE

## 2020-02-09 LAB — BASIC METABOLIC PANEL
Anion gap: 12 (ref 5–15)
BUN: 21 mg/dL (ref 8–23)
CO2: 27 mmol/L (ref 22–32)
Calcium: 9.3 mg/dL (ref 8.9–10.3)
Chloride: 100 mmol/L (ref 98–111)
Creatinine, Ser: 0.75 mg/dL (ref 0.44–1.00)
GFR, Estimated: >60 mL/min (ref >60)
Glucose, Bld: 168 mg/dL — ABNORMAL HIGH (ref 70–99)
Potassium: 3.6 mmol/L (ref 3.5–5.1)
Sodium: 139 mmol/L (ref 135–145)

## 2020-02-09 LAB — CBC WITH DIFFERENTIAL/PLATELET
Abs Immature Granulocytes: 0.09 10*3/uL — ABNORMAL HIGH (ref 0.00–0.07)
Basophils Absolute: 0 10*3/uL (ref 0.0–0.1)
Basophils Relative: 0 %
Eosinophils Absolute: 0 10*3/uL (ref 0.0–0.5)
Eosinophils Relative: 0 %
HCT: 35.4 % — ABNORMAL LOW (ref 36.0–46.0)
Hemoglobin: 12.5 g/dL (ref 12.0–15.0)
Immature Granulocytes: 1 %
Lymphocytes Relative: 3 %
Lymphs Abs: 0.5 10*3/uL — ABNORMAL LOW (ref 0.7–4.0)
MCH: 32.5 pg (ref 26.0–34.0)
MCHC: 35.3 g/dL (ref 30.0–36.0)
MCV: 91.9 fL (ref 80.0–100.0)
Monocytes Absolute: 0.9 10*3/uL (ref 0.1–1.0)
Monocytes Relative: 6 %
Neutro Abs: 13.3 10*3/uL — ABNORMAL HIGH (ref 1.7–7.7)
Neutrophils Relative %: 90 %
Platelets: 208 10*3/uL (ref 150–400)
RBC: 3.85 MIL/uL — ABNORMAL LOW (ref 3.87–5.11)
RDW: 12.5 % (ref 11.5–15.5)
WBC: 14.7 10*3/uL — ABNORMAL HIGH (ref 4.0–10.5)
nRBC: 0 % (ref 0.0–0.2)

## 2020-02-09 LAB — PROTIME-INR
INR: 1.1 (ref 0.8–1.2)
Prothrombin Time: 13.7 seconds (ref 11.4–15.2)

## 2020-02-09 MED ORDER — ALBUTEROL SULFATE (2.5 MG/3ML) 0.083% IN NEBU: 2.5000 mg | INHALATION_SOLUTION | Freq: Four times a day (QID) | RESPIRATORY_TRACT | Status: DC | PRN

## 2020-02-09 MED ORDER — ACETAMINOPHEN 325 MG PO TABS
650.0000 mg | ORAL_TABLET | Freq: Four times a day (QID) | ORAL | Status: DC | PRN
  Filled 2020-02-09: qty 2

## 2020-02-09 MED ORDER — AMLODIPINE BESYLATE 10 MG PO TABS
10.0000 mg | ORAL_TABLET | Freq: Every day | ORAL | Status: DC
  Administered 2020-02-09 – 2020-02-12 (×3): 10 mg via ORAL
  Filled 2020-02-09: qty 2
  Filled 2020-02-09 (×2): qty 1

## 2020-02-09 MED ORDER — ACETAMINOPHEN 650 MG RE SUPP
650.0000 mg | Freq: Four times a day (QID) | RECTAL | Status: DC | PRN
  Administered 2020-02-09: 650 mg via RECTAL
  Filled 2020-02-09: qty 1

## 2020-02-09 MED ORDER — POLYETHYLENE GLYCOL 3350 17 G PO PACK: 17.0000 g | PACK | Freq: Every day | ORAL | Status: DC | PRN

## 2020-02-09 MED ORDER — MEMANTINE HCL 10 MG PO TABS
10.0000 mg | ORAL_TABLET | Freq: Two times a day (BID) | ORAL | Status: DC
  Administered 2020-02-09 – 2020-02-12 (×5): 10 mg via ORAL
  Filled 2020-02-09: qty 2
  Filled 2020-02-09 (×4): qty 1

## 2020-02-09 MED ORDER — TRAZODONE HCL 100 MG PO TABS
100.0000 mg | ORAL_TABLET | Freq: Every day | ORAL | Status: DC
Start: 2020-02-09 — End: 2020-02-12
  Administered 2020-02-09 – 2020-02-11 (×3): 100 mg via ORAL
  Filled 2020-02-09 (×3): qty 1

## 2020-02-09 MED ORDER — BISACODYL 5 MG PO TBEC: 5.0000 mg | DELAYED_RELEASE_TABLET | Freq: Every day | ORAL | Status: DC | PRN

## 2020-02-09 MED ORDER — TRAMADOL HCL 50 MG PO TABS
50.0000 mg | ORAL_TABLET | Freq: Two times a day (BID) | ORAL | Status: DC
  Administered 2020-02-09 – 2020-02-12 (×5): 50 mg via ORAL
  Filled 2020-02-09 (×5): qty 1

## 2020-02-09 MED ORDER — HYDRALAZINE HCL 20 MG/ML IJ SOLN: 10.0000 mg | Freq: Four times a day (QID) | INTRAMUSCULAR | Status: DC | PRN

## 2020-02-09 MED ORDER — FENTANYL CITRATE (PF) 100 MCG/2ML IJ SOLN
50.0000 ug | Freq: Once | INTRAMUSCULAR | Status: AC
  Administered 2020-02-09: 50 ug via INTRAVENOUS
  Filled 2020-02-09: qty 2

## 2020-02-09 MED ORDER — LORAZEPAM 2 MG/ML IJ SOLN
1.0000 mg | Freq: Once | INTRAMUSCULAR | Status: AC
  Administered 2020-02-09: 1 mg via INTRAVENOUS
  Filled 2020-02-09: qty 1

## 2020-02-09 MED ORDER — ACETAMINOPHEN 500 MG PO TABS: 500.0000 mg | ORAL_TABLET | ORAL | Status: DC | PRN

## 2020-02-09 MED ORDER — FLEET ENEMA 7-19 GM/118ML RE ENEM: 1.0000 | ENEMA | Freq: Once | RECTAL | Status: DC | PRN

## 2020-02-09 MED ORDER — ALBUTEROL SULFATE (2.5 MG/3ML) 0.083% IN NEBU: 2.5000 mg | INHALATION_SOLUTION | Freq: Four times a day (QID) | RESPIRATORY_TRACT | Status: DC

## 2020-02-09 MED ORDER — OLANZAPINE 10 MG PO TABS
10.0000 mg | ORAL_TABLET | Freq: Every day | ORAL | Status: DC
  Administered 2020-02-09 – 2020-02-11 (×3): 10 mg via ORAL
  Filled 2020-02-09 (×4): qty 1

## 2020-02-09 MED ORDER — SENNA 8.6 MG PO TABS
1.0000 | ORAL_TABLET | Freq: Two times a day (BID) | ORAL | Status: DC
  Administered 2020-02-09 – 2020-02-12 (×4): 8.6 mg via ORAL
  Filled 2020-02-09 (×4): qty 1

## 2020-02-09 MED ORDER — HALOPERIDOL LACTATE 5 MG/ML IJ SOLN: 2.0000 mg | Freq: Four times a day (QID) | INTRAMUSCULAR | Status: DC | PRN

## 2020-02-09 MED ORDER — SODIUM CHLORIDE 0.9 % IV SOLN: INTRAVENOUS | Status: DC

## 2020-02-09 MED ORDER — ALUM & MAG HYDROXIDE-SIMETH 200-200-20 MG/5ML PO SUSP: 30.0000 mL | Freq: Every day | ORAL | Status: DC | PRN

## 2020-02-09 MED ORDER — MORPHINE SULFATE (PF) 2 MG/ML IV SOLN: 1.0000 mg | INTRAVENOUS | Status: DC | PRN

## 2020-02-09 MED ORDER — HALOPERIDOL 1 MG PO TABS
1.0000 mg | ORAL_TABLET | Freq: Three times a day (TID) | ORAL | Status: DC
  Administered 2020-02-09 – 2020-02-12 (×6): 1 mg via ORAL
  Filled 2020-02-09 (×10): qty 1

## 2020-02-09 NOTE — ED Triage Notes (Signed)
Pt arrives from [facility] for a bruise to the right hip. Per EMS: facility denies any falls. Pt has hx of falls. Pt has advanced dementia. No pain upon palpation. No blood thinners noted. Pt no injury or trauma reported.

## 2020-02-09 NOTE — H&P (Signed)
[facility] History and Physical  LUCILLIA RAGASA [id number] DOB: 10-14-1934 DOA: 02/09/2020 PCP: Cassandria Anger, MD  Admitted from: [facility] Chief Complaint: Right hip area bruising  History of Present Illness: Alice Wong is a 86 y.o. female with PMH significant for advanced dementia, HTN, HLD, CVA, depression/anxiety, history of falls who lives at [facility]. Patient was sent to the ED today for a bruise to the right hip. Per report, patient had a fall in 1/11. She was brought to ED. CT scan of head, neck and maxillofacial were unremarkable at that time.  Per report, patient was ambulatory at the time and hip x-ray was not done. She was discharged back to nursing facility. Per facility, there is no reported fall afterwords but today patient was noted to have bruise on her right hip. Patient herself is not able to elaborate any history because of dementia. Not on blood thinners.  In the ED, patient was afebrile, blood pressure was elevated to 181/84, breathing room air. Labs unremarkable except WBC count elevated to 14.7 and glucose elevated to 168 Covid PCR pending X-ray hip showed comminuted intertrochanteric right hip fracture with impaction and varus angulation. Chest x-ray did not show any acute airspace disease. CT head and CT cervical spine did not show any acute intracranial abnormality. But showed atrophy and mild chronic small vessel ischemic changes of white matter.  ED physician discussed with orthopedics Dr. Mardelle Matte. Hospitalist consult was called for admission.   At the time of my evaluation, patient was somnolent.  Per RN, patient was restless, aggressive and cursing to staff earlier.  She was given 1 dose of IV Ativan after which she calmed down and slept.  Family not at bedside.  Per RN, few minutes prior, orthopedic PA called and discussed with patient's daughter and tentatively plan for surgery tomorrow.  Review of Systems:  All systems were  reviewed and were negative unless otherwise mentioned in the HPI   Past medical history: Past Medical History:  Diagnosis Date  . Anxiety   . Cholelithiasis   . CVA (cerebral vascular accident) ([place])   . Dementia ([place])   . Depression   . Diverticulosis   . GERD (gastroesophageal reflux disease)   . Gout 2009  . H. pylori infection 2011  . Hyperlipidemia   . Hypertension   . Memory loss   . MR (mitral regurgitation)   . Osteoporosis   . S/P cardiac cath 12/20/13 12/20/2013  . Thrombocytopenia ([place])   . Thyroid cyst   . Vaso-vagal reaction   . Vitamin D deficiency     Past surgical history: Past Surgical History:  Procedure Laterality Date  . CESAREAN SECTION    . CHOLECYSTECTOMY  2010  . LASIK    . LEFT HEART CATHETERIZATION WITH CORONARY ANGIOGRAM N/A 12/20/2013   Procedure: LEFT HEART CATHETERIZATION WITH CORONARY ANGIOGRAM;  Surgeon: Burnell Blanks, MD;  Location: [facility] CATH LAB;  Service: Cardiovascular;  Laterality: N/A;    Social History:  reports that she has never smoked. She has never used smokeless tobacco. She reports that she does not drink alcohol and does not use drugs.  Allergies:  Allergies  Allergen Reactions  . Aricept [Donepezil Hcl]     ?dry tongue  . Coreg [Carvedilol]     ?problems  . Morphine Sulfate Other (See Comments)    REACTION: decreased blood pressure    Family history:  Family History  Problem Relation Age of Onset  . Lung disease Father  Deceased  . Hypertension Other   . Stomach cancer Brother   . Ulcers Son   . Colon cancer Neg Hx      Home Meds: Prior to Admission medications   Medication Sig Start Date End Date Taking? Authorizing Provider  acetaminophen (TYLENOL) 500 MG tablet Take 500 mg by mouth every 4 (four) hours as needed for mild pain or fever.   Yes [provider]  alum & mag hydroxide-simeth (MAALOX/MYLANTA) 200-200-20 MG/5ML suspension Take 30 mLs by mouth daily as needed for indigestion  or heartburn. Not to exceed 4 doses in 24 hours   Yes [provider]  amLODipine (NORVASC) 10 MG tablet Take 10 mg by mouth daily.   Yes [provider]  aspirin EC 81 MG EC tablet Take 1 tablet (81 mg total) by mouth daily. 12/20/13  Yes Isaiah Serge, NP  Cholecalciferol (D3-50) 1.25 MG (50000 UT) capsule Take 1 capsule (50,000 Units total) by mouth every 30 (thirty) days. 11/25/17  Yes Plotnikov, Evie Lacks, MD  DULoxetine (CYMBALTA) 60 MG capsule Take 1 capsule (60 mg total) by mouth daily. 02/14/18 02/14/19 Yes Arfeen, Arlyce Harman, MD  guaifenesin (ROBITUSSIN) 100 MG/5ML syrup Take 200 mg by mouth every 6 (six) hours as needed for cough.   Yes [provider]  haloperidol (HALDOL) 1 MG tablet Take 1 mg by mouth 3 (three) times daily.   Yes [provider]  loperamide (IMODIUM) 2 MG capsule Take 2 mg by mouth daily as needed for diarrhea or loose stools. Do not exceed 8 doses in 24 hours   Yes [provider]  magnesium hydroxide (MILK OF MAGNESIA) 400 MG/5ML suspension Take 30 mLs by mouth at bedtime as needed for mild constipation.   Yes [provider]  memantine (NAMENDA) 10 MG tablet TAKE 1 TABLET BY MOUTH TWICE A DAY Patient taking differently: Take 10 mg by mouth 2 (two) times daily. 10/03/17  Yes Plotnikov, Evie Lacks, MD  neomycin-bacitracin-polymyxin (NEOSPORIN) [id number] ointment Apply 1 application topically daily as needed (skin tear).   Yes [provider]  nitroGLYCERIN (NITROSTAT) 0.4 MG SL tablet DISSOLVE ONE TABLET UNDER THE TONGUE EVERY 5 MINUTES AS NEEDED FOR CHEST PAIN Patient taking differently: Place 0.4 mg under the tongue every 5 (five) minutes as needed for chest pain. 08/23/16  Yes Plotnikov, Evie Lacks, MD  OLANZapine (ZYPREXA) 10 MG tablet Take 1 tablet (10 mg total) by mouth at bedtime. 02/14/18  Yes Arfeen, Arlyce Harman, MD  traMADol (ULTRAM) 50 MG tablet Take 50 mg by mouth 2 (two) times daily. 10/15/18  Yes [provider]  traZODone (DESYREL) 100 MG tablet Take 1 tablet (100 mg total) by mouth at bedtime. 02/14/18  Yes Arfeen, Arlyce Harman, MD  amLODipine (NORVASC) 5 MG tablet TAKE 1 TABLET BY MOUTH EVERY DAY Patient not taking: Reported on 02/09/2020 09/16/17   Plotnikov, Evie Lacks, MD  LORazepam (ATIVAN) 2 MG tablet Take 1 tablet (2 mg total) by mouth every 6 (six) hours as needed for anxiety, sedation or sleep (agitation). Patient not taking: Reported on 02/09/2020 12/09/17   Plotnikov, Evie Lacks, MD  metFORMIN (GLUCOPHAGE) 500 MG tablet TAKE 1 TABLET BY MOUTH TWICE A DAY WITH MEALS Patient not taking: Reported on 02/09/2020 10/01/17   Plotnikov, Evie Lacks, MD    Physical Exam: Vitals:   02/09/20 1421 02/09/20 1434 02/09/20 1615 02/09/20 1645  BP: (!) 181/84   128/73  Pulse: 98   81  Resp: 18  18  20  Temp:  98.8 F (37.1 C)    TempSrc:  Axillary    SpO2: 100%  98% 96%   Wt Readings from Last 3 Encounters:  03/11/18 63.5 kg  12/01/17 77.1 kg  08/15/17 66.7 kg   There is no height or weight on file to calculate BMI.  General exam: Somnolent, elderly female.  Not in distress at the time of my evaluation Skin: No rashes, lesions or ulcers. HEENT: Atraumatic, normocephalic, no obvious bleeding Lungs: Clear to auscultation bilaterally CVS: Regular rate and rhythm, no murmur GI/Abd soft, nontender, nondistended, bowel sound present CNS: Somnolent, tries to open eyes on touch.  Received Ativan earlier.  Not restless or agitated at this time Psychiatry: Somnolent Extremities: No pedal edema, no calf tenderness     Consult Orders  (From admission, onward)         Start     Ordered   02/09/20 1703  Consult to hospitalist  ALL PATIENTS BEING ADMITTED/HAVING PROCEDURES NEED COVID-19 SCREENING  Once       Comments: ALL PATIENTS BEING ADMITTED/HAVING PROCEDURES NEED COVID-19 SCREENING  Provider:  (Not yet assigned)  Question Answer Comment  Place call to: [facility]   Reason for Consult  Admit      02/09/20 1702          Labs on Admission:   CBC: Recent Labs  Lab 02/09/20 1430  WBC 14.7*  NEUTROABS 13.3*  HGB 12.5  HCT 35.4*  MCV 91.9  PLT 123XX123    Basic Metabolic Panel: Recent Labs  Lab 02/09/20 1430  NA 139  K 3.6  CL 100  CO2 27  GLUCOSE 168*  BUN 21  CREATININE 0.75  CALCIUM 9.3    Liver Function Tests: No results for input(s): AST, ALT, ALKPHOS, BILITOT, PROT, ALBUMIN in the last 168 hours. No results for input(s): LIPASE, AMYLASE in the last 168 hours. No results for input(s): AMMONIA in the last 168 hours.  Cardiac Enzymes: No results for input(s): CKTOTAL, CKMB, CKMBINDEX, TROPONINI in the last 168 hours.  BNP (last 3 results) No results for input(s): BNP in the last 8760 hours.  ProBNP (last 3 results) No results for input(s): PROBNP in the last 8760 hours.  CBG: No results for input(s): GLUCAP in the last 168 hours.  Lipase     Component Value Date/Time   LIPASE 25 03/29/2014 0407     Urinalysis    Component Value Date/Time   COLORURINE YELLOW 12/01/2017 1651   APPEARANCEUR CLEAR 12/01/2017 1651   LABSPEC 1.015 12/01/2017 1651   PHURINE 7.0 12/01/2017 1651   GLUCOSEU NEGATIVE 12/01/2017 1651   GLUCOSEU NEGATIVE 08/15/2017 1354   HGBUR NEGATIVE 12/01/2017 1651   BILIRUBINUR NEGATIVE 12/01/2017 1651   KETONESUR 5 (A) 12/01/2017 1651   PROTEINUR NEGATIVE 12/01/2017 1651   UROBILINOGEN 1.0 08/15/2017 1354   NITRITE NEGATIVE 12/01/2017 1651   LEUKOCYTESUR TRACE (A) 12/01/2017 1651     Drugs of Abuse     Component Value Date/Time   LABOPIA NONE DETECTED 12/01/2017 1651   COCAINSCRNUR NONE DETECTED 12/01/2017 1651   LABBENZ POSITIVE (A) 12/01/2017 1651   AMPHETMU NONE DETECTED 12/01/2017 1651   THCU NONE DETECTED 12/01/2017 1651   LABBARB NONE DETECTED 12/01/2017 1651      Radiological Exams on Admission: DG Chest 1 View  Result Date: 02/09/2020 CLINICAL DATA:  Fall EXAM: CHEST  1 VIEW COMPARISON:   12/04/2017, 02/21/2017 FINDINGS: No acute airspace disease or effusion. Stable cardiomediastinal silhouette with borderline cardiomegaly.  Suspected skin fold artifact over the left chest. Asymmetrical opacity at the left lung apex. Ovoid nodularity in the right mid lung. IMPRESSION: 1. No acute airspace disease. 2. Asymmetrical opacity at the left lung apex, probably due to first rib end and bony summation though appears different compared to prior. Additional small nodular opacity in the right mid lung. Recommend chest CT for further evaluation. Electronically Signed   By: Donavan Foil M.D.   On: 02/09/2020 16:44   CT Head Wo Contrast  Result Date: 02/09/2020 CLINICAL DATA:  Facial trauma fall hip fracture EXAM: CT HEAD WITHOUT CONTRAST CT CERVICAL SPINE WITHOUT CONTRAST TECHNIQUE: Multidetector CT imaging of the head and cervical spine was performed following the standard protocol without intravenous contrast. Multiplanar CT image reconstructions of the cervical spine were also generated. COMPARISON:  CT brain and cervical spine 01/25/2020 FINDINGS: CT HEAD FINDINGS Brain: No acute territorial infarction, hemorrhage or intracranial mass. Moderate atrophy. Mild hypodensity in the white matter consistent with chronic small vessel ischemic change. Stable ventricle size Vascular: No hyperdense vessels. Vertebral and carotid vascular calcification Skull: Normal. Negative for fracture or focal lesion. Sinuses/Orbits: Hyperdense secretions in the left maxillary sinus. Mucosal thickening in the sinuses Other: Small hyperdense mass in the subcutaneous soft tissues of the right cheek consistent with hematoma, decreased compared to prior CT CERVICAL SPINE FINDINGS Alignment: Trace retrolisthesis C3 on C4, stable. Facet alignment is maintained. Skull base and vertebrae: No acute fracture. No primary bone lesion or focal pathologic process. Soft tissues and spinal canal: No prevertebral fluid or swelling. No visible canal  hematoma. Disc levels: Multiple level degenerative change, moderate disc space narrowing at C3-C4 and C6-C7 with moderate advanced disease at C5-C6. Facet degenerative changes at multiple levels. Upper chest: Negative. Other: None IMPRESSION: 1. No CT evidence for acute intracranial abnormality. Atrophy and mild chronic small vessel ischemic change of the white matter. 2. Degenerative changes of the cervical spine. No acute osseous abnormality. Electronically Signed   By: Donavan Foil M.D.   On: 02/09/2020 16:54   CT Cervical Spine Wo Contrast  Result Date: 02/09/2020 CLINICAL DATA:  Facial trauma fall hip fracture EXAM: CT HEAD WITHOUT CONTRAST CT CERVICAL SPINE WITHOUT CONTRAST TECHNIQUE: Multidetector CT imaging of the head and cervical spine was performed following the standard protocol without intravenous contrast. Multiplanar CT image reconstructions of the cervical spine were also generated. COMPARISON:  CT brain and cervical spine 01/25/2020 FINDINGS: CT HEAD FINDINGS Brain: No acute territorial infarction, hemorrhage or intracranial mass. Moderate atrophy. Mild hypodensity in the white matter consistent with chronic small vessel ischemic change. Stable ventricle size Vascular: No hyperdense vessels. Vertebral and carotid vascular calcification Skull: Normal. Negative for fracture or focal lesion. Sinuses/Orbits: Hyperdense secretions in the left maxillary sinus. Mucosal thickening in the sinuses Other: Small hyperdense mass in the subcutaneous soft tissues of the right cheek consistent with hematoma, decreased compared to prior CT CERVICAL SPINE FINDINGS Alignment: Trace retrolisthesis C3 on C4, stable. Facet alignment is maintained. Skull base and vertebrae: No acute fracture. No primary bone lesion or focal pathologic process. Soft tissues and spinal canal: No prevertebral fluid or swelling. No visible canal hematoma. Disc levels: Multiple level degenerative change, moderate disc space narrowing at  C3-C4 and C6-C7 with moderate advanced disease at C5-C6. Facet degenerative changes at multiple levels. Upper chest: Negative. Other: None IMPRESSION: 1. No CT evidence for acute intracranial abnormality. Atrophy and mild chronic small vessel ischemic change of the white matter. 2. Degenerative changes of the cervical  spine. No acute osseous abnormality. Electronically Signed   By: Donavan Foil M.D.   On: 02/09/2020 16:54   DG Hip Unilat With Pelvis 2-3 Views Right  Result Date: 02/09/2020 CLINICAL DATA:  Right hip bruising, history of falls EXAM: DG HIP (WITH OR WITHOUT PELVIS) 2-3V RIGHT COMPARISON:  None. FINDINGS: Frontal view of the pelvis as well as frontal and cross-table lateral views of the right hip are obtained. There is a comminuted intertrochanteric right hip fracture with impaction and varus angulation at the fracture site. No dislocation. No additional bony abnormalities. Left hip is unremarkable. IMPRESSION: 1. Comminuted intertrochanteric right hip fracture with impaction and varus angulation. Electronically Signed   By: Randa Ngo M.D.   On: 02/09/2020 15:18     ------------------------------------------------------------------------------------------------------ Assessment/Plan: Principal Problem:   Hip fracture (HCC)  Comminuted intertrochanteric right hip fracture History of falls -Per SNF, no clear incident of fall but patient has history of falls. -Brought in for right hip bruising.  X-ray showed comminuted intertrochanteric right hip fracture -Orthopedics consulted.  Tentative plan for surgery tomorrow. -Patient is not on blood thinners. -Soft diet for now, n.p.o. after midnight. -PT eval postprocedure  Advanced dementia with behavioral symptoms History of depression/anxiety -Home meds include Haldol 1 mg 3 times daily, Namenda 10 mg twice daily, Zyprexa 10 mg daily at bedtime, trazodone 1 mg at bedtime. -Resume all. -May need additional IV Haldol if remains  agitated.  QTc 387 ms  Cardiovascular issues: HTN, HLD, CVA -Home meds include amlodipine 10 mg daily, aspirin 81 mg daily. -Not on statin. -Continue amlodipine.  Aspirin on hold prior to procedure.  -Continue monitor blood pressure.  Mobility: PT evaluation post procedure Code Status:   Code Status: Prior full code for now.  No DNR available in the chart.  Unable to have this discussion with patient at this time DVT prophylaxis:  Lovenox subcu post procedure, SCDs for now. Antimicrobials:  None Fluid: Normal saline at 50 mill per hour  Diet:  Diet Order    None    Soft diet for tonight, n.p.o. after midnight  Consultants: Orthopedics Family Communication:  Not at bedside  Dispo: The patient is from: SNF              Anticipated d/c is to: SNF              Anticipated d/c date is: > 3 days  ------------------------------------------------------------------------------------- Severity of Illness: The appropriate patient status for this patient is INPATIENT. Inpatient status is judged to be reasonable and necessary in order to provide the required intensity of service to ensure the patient's safety. The patient's presenting symptoms, physical exam findings, and initial radiographic and laboratory data in the context of their chronic comorbidities is felt to place them at high risk for further clinical deterioration. Furthermore, it is not anticipated that the patient will be medically stable for discharge from the hospital within 2 midnights of admission. The following factors support the patient status of inpatient.   " The patient's presenting symptoms include right hip bruising. " The worrisome physical exam findings include right hip bruising. " The initial radiographic and laboratory data are worrisome because of right hip fracture. " The chronic co-morbidities include dementia.   * I certify that at the point of admission it is my clinical judgment that the patient will  require inpatient hospital care spanning beyond 2 midnights from the point of admission due to high intensity of service, high risk for further deterioration and high  frequency of surveillance required.*  Signed, Terrilee Croak, MD [facility] 02/09/2020

## 2020-02-09 NOTE — ED Notes (Signed)
Patient transported to X-ray 

## 2020-02-09 NOTE — ED Provider Notes (Signed)
[facility] DEPT Provider Note   CSN: [id number] Arrival date & time: 02/09/20  1357     History Chief Complaint  Patient presents with  . Bleeding/Bruising    Alice Wong is a 86 y.o. female hx of stroke, dementia, HTN, here with bruising to the right hip.  Patient had a fall on 1/11 and had a CT head neck and face that was unremarkable.  Patient went back to the nursing home. There was no reported fall afterwards but patient was noted to have increasing bruising on the right hip area. Patient is demented and unable to give much history at all.   The history is provided by the EMS personnel.  Level V caveat- dementia      Past Medical History:  Diagnosis Date  . Anxiety   . Cholelithiasis   . CVA (cerebral vascular accident) ([place])   . Dementia ([place])   . Depression   . Diverticulosis   . GERD (gastroesophageal reflux disease)   . Gout 2009  . H. pylori infection 2011  . Hyperlipidemia   . Hypertension   . Memory loss   . MR (mitral regurgitation)   . Osteoporosis   . S/P cardiac cath 12/20/13 12/20/2013  . Thrombocytopenia ([place])   . Thyroid cyst   . Vaso-vagal reaction   . Vitamin D deficiency     Patient Active Problem List   Diagnosis Date Noted  . Agitation 12/09/2017  . Paranoia ([place])   . Incontinence of bowel 08/15/2017  . Chest wall contusion 02/21/2017  . Diarrhea 09/22/2015  . Redness of eye, right 02/15/2015  . Noncompliance with medication regimen 11/15/2014  . Dementia with behavioral disturbance ([place]) 07/28/2014  . Cerebral infarction ([place]) 07/21/2014  . Sternum pain 04/21/2014  . Costochondritis 04/13/2014  . Simple chronic bronchitis ([place]) 04/13/2014  . Cough 03/22/2014  . S/P cardiac cath 12/20/13, non obsteructive disease 12/20/2013  . Abnormal nuclear stress test, false positive per cath   . Vasovagal syncope 12/17/2013  . Midsternal chest pain 12/17/2013  . Neoplasm of uncertain behavior of skin 10/27/2013   . Uncontrolled type 2 diabetes with renal manifestation ([place]) 11/04/2012  . Pain of right thumb 12/20/2011  . Psychosomatic disorder 08/23/2011  . Tinnitus 06/04/2011  . Headache, post-traumatic, chronic 05/01/2011  . LBP (low back pain) 01/30/2011  . Insomnia 11/19/2010  . Actinic keratoses 05/23/2010  . Dizziness 04/09/2010  . Bruising 04/09/2010  . Thrombocytopenia ([place]) 01/02/2010  . HEMORRHOIDS 12/12/2009  . HELICOBACTER PYLORI INFECTION 03/17/2009  . PARESTHESIA 03/17/2009  . Chest pain, unspecified 08/01/2008  . Vitamin D deficiency 03/03/2008  . GAIT IMBALANCE 10/22/2007  . Gout, unspecified 05/07/2007  . FOOT PAIN 05/07/2007  . Dyslipidemia 02/05/2007  . Anxiety state 02/05/2007  . Depression with anxiety 02/05/2007  . Essential hypertension 02/05/2007  . GERD 02/05/2007  . OSTEOPOROSIS 02/05/2007  . Memory loss 02/05/2007  . History of cardiovascular disorder 02/05/2007    Past Surgical History:  Procedure Laterality Date  . CESAREAN SECTION    . CHOLECYSTECTOMY  2010  . LASIK    . LEFT HEART CATHETERIZATION WITH CORONARY ANGIOGRAM N/A 12/20/2013   Procedure: LEFT HEART CATHETERIZATION WITH CORONARY ANGIOGRAM;  Surgeon: Burnell Blanks, MD;  Location: [facility] CATH LAB;  Service: Cardiovascular;  Laterality: N/A;     OB History   No obstetric history on file.     Family History  Problem Relation Age of Onset  . Lung disease Father  Deceased  . Hypertension Other   . Stomach cancer Brother   . Ulcers Son   . Colon cancer Neg Hx     Social History   Tobacco Use  . Smoking status: Never Smoker  . Smokeless tobacco: Never Used  Vaping Use  . Vaping Use: Never used  Substance Use Topics  . Alcohol use: No    Alcohol/week: 0.0 standard drinks  . Drug use: No    Types: Methylphenidate    Home Medications Prior to Admission medications   Medication Sig Start Date End Date Taking? Authorizing Provider  acetaminophen (TYLENOL) 500 MG  tablet Take 500 mg by mouth every 4 (four) hours as needed for mild pain or fever.   Yes [provider]  alum & mag hydroxide-simeth (MAALOX/MYLANTA) 200-200-20 MG/5ML suspension Take 30 mLs by mouth daily as needed for indigestion or heartburn. Not to exceed 4 doses in 24 hours   Yes [provider]  amLODipine (NORVASC) 10 MG tablet Take 10 mg by mouth daily.   Yes [provider]  aspirin EC 81 MG EC tablet Take 1 tablet (81 mg total) by mouth daily. 12/20/13  Yes Isaiah Serge, NP  Cholecalciferol (D3-50) 1.25 MG (50000 UT) capsule Take 1 capsule (50,000 Units total) by mouth every 30 (thirty) days. 11/25/17  Yes Plotnikov, Evie Lacks, MD  DULoxetine (CYMBALTA) 60 MG capsule Take 1 capsule (60 mg total) by mouth daily. 02/14/18 02/14/19 Yes Arfeen, Arlyce Harman, MD  guaifenesin (ROBITUSSIN) 100 MG/5ML syrup Take 200 mg by mouth every 6 (six) hours as needed for cough.   Yes [provider]  haloperidol (HALDOL) 1 MG tablet Take 1 mg by mouth 3 (three) times daily.   Yes [provider]  loperamide (IMODIUM) 2 MG capsule Take 2 mg by mouth daily as needed for diarrhea or loose stools. Do not exceed 8 doses in 24 hours   Yes [provider]  magnesium hydroxide (MILK OF MAGNESIA) 400 MG/5ML suspension Take 30 mLs by mouth at bedtime as needed for mild constipation.   Yes [provider]  memantine (NAMENDA) 10 MG tablet TAKE 1 TABLET BY MOUTH TWICE A DAY Patient taking differently: Take 10 mg by mouth 2 (two) times daily. 10/03/17  Yes Plotnikov, Evie Lacks, MD  neomycin-bacitracin-polymyxin (NEOSPORIN) [phone] ointment Apply 1 application topically daily as needed (skin tear).   Yes [provider]  nitroGLYCERIN (NITROSTAT) 0.4 MG SL tablet DISSOLVE ONE TABLET UNDER THE TONGUE EVERY 5 MINUTES AS NEEDED FOR CHEST PAIN Patient taking differently: Place 0.4 mg under the tongue every 5 (five) minutes as needed for chest pain. 08/23/16  Yes  Plotnikov, Evie Lacks, MD  OLANZapine (ZYPREXA) 10 MG tablet Take 1 tablet (10 mg total) by mouth at bedtime. 02/14/18  Yes Arfeen, Arlyce Harman, MD  traMADol (ULTRAM) 50 MG tablet Take 50 mg by mouth 2 (two) times daily. 10/15/18  Yes [provider]  traZODone (DESYREL) 100 MG tablet Take 1 tablet (100 mg total) by mouth at bedtime. 02/14/18  Yes Arfeen, Arlyce Harman, MD  amLODipine (NORVASC) 5 MG tablet TAKE 1 TABLET BY MOUTH EVERY DAY Patient not taking: Reported on 02/09/2020 09/16/17   Plotnikov, Evie Lacks, MD  LORazepam (ATIVAN) 2 MG tablet Take 1 tablet (2 mg total) by mouth every 6 (six) hours as needed for anxiety, sedation or sleep (agitation). Patient not taking: Reported on 02/09/2020 12/09/17   Plotnikov, Evie Lacks, MD  metFORMIN (GLUCOPHAGE) 500 MG tablet TAKE 1  TABLET BY MOUTH TWICE A DAY WITH MEALS Patient not taking: Reported on 02/09/2020 10/01/17   Plotnikov, Evie Lacks, MD    Allergies    Aricept Reather Littler hcl], Coreg [carvedilol], and Morphine sulfate  Review of Systems   Review of Systems  Musculoskeletal:       R hip pain   All other systems reviewed and are negative.   Physical Exam Updated Vital Signs BP (!) 181/84 (BP Location: Right Arm)   Pulse 98   Temp 98.8 F (37.1 C) (Axillary)   Resp 18   SpO2 100%   Physical Exam Vitals and nursing note reviewed.  Constitutional:      Comments: Demented, agitated   HENT:     Head: Normocephalic.     Comments: Bruising on the bridge of nose     Nose: Nose normal.     Mouth/Throat:     Mouth: Mucous membranes are moist.  Eyes:     Extraocular Movements: Extraocular movements intact.     Pupils: Pupils are equal, round, and reactive to light.  Cardiovascular:     Rate and Rhythm: Normal rate and regular rhythm.     Pulses: Normal pulses.     Heart sounds: Normal heart sounds.  Pulmonary:     Effort: Pulmonary effort is normal.     Breath sounds: Normal breath sounds.  Abdominal:     General: Abdomen is flat.      Palpations: Abdomen is soft.  Musculoskeletal:     Cervical back: Normal range of motion and neck supple.     Comments: R hip bruising, R hip shortened and rotated. No spinal tenderness or obvious deformity   Skin:    General: Skin is warm.     Capillary Refill: Capillary refill takes less than 2 seconds.  Neurological:     Comments: Demented, moving all extremities   Psychiatric:     Comments: Agitated, demented      ED Results / Procedures / Treatments   Labs (all labs ordered are listed, but only abnormal results are displayed) Labs Reviewed  CBC WITH DIFFERENTIAL/PLATELET - Abnormal; Notable for the following components:      Result Value   WBC 14.7 (*)    RBC 3.85 (*)    HCT 35.4 (*)    Neutro Abs 13.3 (*)    Lymphs Abs 0.5 (*)    Abs Immature Granulocytes 0.09 (*)    All other components within normal limits  SARS CORONAVIRUS 2 BY RT PCR (HOSPITAL ORDER, [facility])  PROTIME-INR  BASIC METABOLIC PANEL    EKG None  Radiology DG Hip Unilat With Pelvis 2-3 Views Right  Result Date: 02/09/2020 CLINICAL DATA:  Right hip bruising, history of falls EXAM: DG HIP (WITH OR WITHOUT PELVIS) 2-3V RIGHT COMPARISON:  None. FINDINGS: Frontal view of the pelvis as well as frontal and cross-table lateral views of the right hip are obtained. There is a comminuted intertrochanteric right hip fracture with impaction and varus angulation at the fracture site. No dislocation. No additional bony abnormalities. Left hip is unremarkable. IMPRESSION: 1. Comminuted intertrochanteric right hip fracture with impaction and varus angulation. Electronically Signed   By: Randa Ngo M.D.   On: 02/09/2020 15:18    Procedures Procedures   Medications Ordered in ED Medications  LORazepam (ATIVAN) injection 1 mg (has no administration in time range)  fentaNYL (SUBLIMAZE) injection 50 mcg (has no administration in time range)    ED Course  I  have reviewed the triage  vital signs and the nursing notes.  Pertinent labs & imaging results that were available during my care of the patient were reviewed by me and considered in my medical decision making (see chart for details).    MDM Rules/Calculators/A&P                         Alice Wong is a 86 y.o. female here with fall. Patient had a fall on 1/11. Patient is from nursing home and unclear if she had another fall.  Patient does have some bruising in the right hip area.  Concern for possible hip fracture.  Plan to get x-rays and basic blood work   5:28 PM Patient has impacted right hip fracture.  Patient is not on blood thinners.  I talked to Dr. Mardelle Matte from Ortho.  He plans to operate tomorrow.  Hospitalist to admit for hip fracture.  Preop labs are unremarkable and Covid test is pending  Final Clinical Impression(s) / ED Diagnoses Final diagnoses:  None    Rx / DC Orders ED Discharge Orders    None       Drenda Freeze, MD 02/09/20 1729

## 2020-02-09 NOTE — Consult Note (Signed)
Time Called: 5:28pm Time Arrived at the Bedside: 5:37pm  ORTHOPAEDIC CONSULTATION  REQUESTING PHYSICIAN: Terrilee Croak, MD  Chief Complaint: Right hip bruising and pain  HPI: Alice Wong is a 86 y.o. female with advanced dementia and a history of multiple falls who was brought in via EMS from [facility]. She is non-verbal. History was obtained from EMS and the patient's daughter [name] who has POA. Patient had an unwitnessed fall at the SNF on 01/25/20 and was brought to the ED with a facial laceration. She had no bruising or pain in her extremities at the time so no imaging was done of her pelvis or lower legs. Staff at the SNF noticed bruising to the patient's right hip today and thus had her taken back to the ED. She has not had any other falls since 1/11 that the SNF is aware of.    Past Medical History:  Diagnosis Date  . Anxiety   . Cholelithiasis   . CVA (cerebral vascular accident) ([place])   . Dementia ([place])   . Depression   . Diverticulosis   . GERD (gastroesophageal reflux disease)   . Gout 2009  . H. pylori infection 2011  . Hyperlipidemia   . Hypertension   . Memory loss   . MR (mitral regurgitation)   . Osteoporosis   . S/P cardiac cath 12/20/13 12/20/2013  . Thrombocytopenia ([place])   . Thyroid cyst   . Vaso-vagal reaction   . Vitamin D deficiency    Past Surgical History:  Procedure Laterality Date  . CESAREAN SECTION    . CHOLECYSTECTOMY  2010  . LASIK    . LEFT HEART CATHETERIZATION WITH CORONARY ANGIOGRAM N/A 12/20/2013   Procedure: LEFT HEART CATHETERIZATION WITH CORONARY ANGIOGRAM;  Surgeon: Burnell Blanks, MD;  Location: [facility] CATH LAB;  Service: Cardiovascular;  Laterality: N/A;   Social History   Socioeconomic History  . Marital status: Widowed    Spouse name: Not on file  . Number of children: 3  . Years of education: Not on file  . Highest education level: Not on file  Occupational History  . Occupation: RETIRED  Tobacco Use  .  Smoking status: Never Smoker  . Smokeless tobacco: Never Used  Vaping Use  . Vaping Use: Never used  Substance and Sexual Activity  . Alcohol use: No    Alcohol/week: 0.0 standard drinks  . Drug use: No    Types: Methylphenidate  . Sexual activity: Never  Other Topics Concern  . Not on file  Social History Narrative   Family History Hypertension   No caffeine    Lives with husband in a 2 story home.  Retired.    Moved from [place] in 2002   Social Determinants of Health   Financial Resource Strain: Not on file  Food Insecurity: Not on file  Transportation Needs: Not on file  Physical Activity: Not on file  Stress: Not on file  Social Connections: Not on file   Family History  Problem Relation Age of Onset  . Lung disease Father        Deceased  . Hypertension Other   . Stomach cancer Brother   . Ulcers Son   . Colon cancer Neg Hx    Allergies  Allergen Reactions  . Aricept [Donepezil Hcl]     ?dry tongue  . Coreg [Carvedilol]     ?problems  . Morphine Sulfate Other (See Comments)    REACTION: decreased blood pressure  Positive ROS: All other systems have been reviewed and were otherwise negative with the exception of those mentioned in the HPI and as above.  Physical Exam: General: non-verbal, laying on stretcher in hallway, eyes mostly closed, occasional moaning Cardiovascular: RRR, No pedal edema Respiratory: CTAB, No cyanosis GI: No organomegaly, abdomen is soft and non-tender Skin: No lesions in the area of chief complaint. Healing laceration to the nose Neurologic: moves when skin is touched, unable to fully assess sensation d/t patient's state  Psychiatric: currently not very agitated or restless but was earlier and received Ativan   MUSCULOSKELETAL: Right hip shows evidence of bruising. It is TTP near the greater trochanter with the patient winces with pain when palpated there. Right leg is shortened and rotated. No reactions when the other  extremities are palpated along their entire length. No signs of bruising or lacerations to rest of extremities.   Imaging:  DG HIP (WITH OR WITHOUT PELVIS) 2-3V RIGHT  COMPARISON:  None.  FINDINGS: Frontal view of the pelvis as well as frontal and cross-table lateral views of the right hip are obtained. There is a comminuted intertrochanteric right hip fracture with impaction and varus angulation at the fracture site. No dislocation. No additional bony abnormalities. Left hip is unremarkable.  IMPRESSION: 1. Comminuted intertrochanteric right hip fracture with impaction and varus angulation   Assessment: Active Problems:   Hip fracture (HCC)  Right intertrochanteric hip fracture that likely occurred on 01/25/20 but was missed at that ED visit as no imaging was done of her pelvis or extremities since she had no pain at that time.   Patient is normally able to walk on her own but currently is unable to.   Plan: I called the patient's daughter [name] who has POA at [phone] and spoke at length with her about her mother's current condition. I explained the non-operative and operative options for the patient as well as risks and benefits of both options. After hearing all this information, the daughter elected to proceed with surgery. She will be coming to the ED tonight to sign a consent form.   Right hip IM nail tomorrow with Dr. Mardelle Matte at 3:15pm NPO at midnight Hold ASA. Will get Lovenox postop. PT/OT to evaluate postop Control pain and agitation per current regimen   Britt Bottom, PA-C   02/09/2020 5:46 PM

## 2020-02-10 ENCOUNTER — Inpatient Hospital Stay (HOSPITAL_COMMUNITY): Payer: Medicare Other

## 2020-02-10 ENCOUNTER — Encounter (HOSPITAL_COMMUNITY): Payer: Self-pay | Admitting: Internal Medicine

## 2020-02-10 ENCOUNTER — Inpatient Hospital Stay (HOSPITAL_COMMUNITY): Payer: Medicare Other | Admitting: Anesthesiology

## 2020-02-10 ENCOUNTER — Other Ambulatory Visit: Payer: Self-pay

## 2020-02-10 ENCOUNTER — Encounter (HOSPITAL_COMMUNITY)
Admission: EM | Disposition: A | Discharge status: Skilled Nursing Facility | Payer: Self-pay | Source: Home / Self Care | Attending: Internal Medicine

## 2020-02-10 DIAGNOSIS — S72001A Fracture of unspecified part of neck of right femur, initial encounter for closed fracture: Secondary | ICD-10-CM | POA: Diagnosis not present

## 2020-02-10 HISTORY — PX: INTRAMEDULLARY (IM) NAIL INTERTROCHANTERIC: SHX5875

## 2020-02-10 LAB — CBC
HCT: 30.2 % — ABNORMAL LOW (ref 36.0–46.0)
Hemoglobin: 10.4 g/dL — ABNORMAL LOW (ref 12.0–15.0)
MCH: 32.4 pg (ref 26.0–34.0)
MCHC: 34.4 g/dL (ref 30.0–36.0)
MCV: 94.1 fL (ref 80.0–100.0)
Platelets: 158 10*3/uL (ref 150–400)
RBC: 3.21 MIL/uL — ABNORMAL LOW (ref 3.87–5.11)
RDW: 12.5 % (ref 11.5–15.5)
WBC: 9.9 10*3/uL (ref 4.0–10.5)
nRBC: 0 % (ref 0.0–0.2)

## 2020-02-10 LAB — BASIC METABOLIC PANEL
Anion gap: 11 (ref 5–15)
BUN: 27 mg/dL — ABNORMAL HIGH (ref 8–23)
CO2: 24 mmol/L (ref 22–32)
Calcium: 8.8 mg/dL — ABNORMAL LOW (ref 8.9–10.3)
Chloride: 103 mmol/L (ref 98–111)
Creatinine, Ser: 0.95 mg/dL (ref 0.44–1.00)
GFR, Estimated: 59 mL/min — ABNORMAL LOW (ref >60)
Glucose, Bld: 156 mg/dL — ABNORMAL HIGH (ref 70–99)
Potassium: 3.4 mmol/L — ABNORMAL LOW (ref 3.5–5.1)
Sodium: 138 mmol/L (ref 135–145)

## 2020-02-10 LAB — SURGICAL PCR SCREEN
MRSA, PCR: NEGATIVE
Staphylococcus aureus: NEGATIVE

## 2020-02-10 SURGERY — FIXATION, FRACTURE, INTERTROCHANTERIC, WITH INTRAMEDULLARY ROD
Anesthesia: General | Laterality: Right

## 2020-02-10 MED ORDER — ROCURONIUM BROMIDE 10 MG/ML (PF) SYRINGE
PREFILLED_SYRINGE | INTRAVENOUS | Status: DC | PRN
  Administered 2020-02-10: 60 mg via INTRAVENOUS

## 2020-02-10 MED ORDER — PHENYLEPHRINE 40 MCG/ML (10ML) SYRINGE FOR IV PUSH (FOR BLOOD PRESSURE SUPPORT)
PREFILLED_SYRINGE | INTRAVENOUS | Status: DC | PRN
  Administered 2020-02-10: 160 ug via INTRAVENOUS
  Administered 2020-02-10 (×2): 120 ug via INTRAVENOUS

## 2020-02-10 MED ORDER — PHENYLEPHRINE HCL-NACL 10-0.9 MG/250ML-% IV SOLN
INTRAVENOUS | Status: DC | PRN
  Administered 2020-02-10: 20 ug/min via INTRAVENOUS

## 2020-02-10 MED ORDER — ACETAMINOPHEN 500 MG PO TABS
1000.0000 mg | ORAL_TABLET | Freq: Once | ORAL | Status: DC
  Filled 2020-02-10: qty 2

## 2020-02-10 MED ORDER — ONDANSETRON HCL 4 MG/2ML IJ SOLN: 4.0000 mg | Freq: Four times a day (QID) | INTRAMUSCULAR | Status: DC | PRN

## 2020-02-10 MED ORDER — LACTATED RINGERS IV SOLN: INTRAVENOUS | Status: DC

## 2020-02-10 MED ORDER — FENTANYL CITRATE (PF) 100 MCG/2ML IJ SOLN
INTRAMUSCULAR | Status: AC
  Filled 2020-02-10: qty 2

## 2020-02-10 MED ORDER — FENTANYL CITRATE (PF) 100 MCG/2ML IJ SOLN: 25.0000 ug | INTRAMUSCULAR | Status: DC | PRN

## 2020-02-10 MED ORDER — PROPOFOL 10 MG/ML IV BOLUS
INTRAVENOUS | Status: DC | PRN
  Administered 2020-02-10: 80 mg via INTRAVENOUS
  Administered 2020-02-10: 70 mg via INTRAVENOUS

## 2020-02-10 MED ORDER — PROPOFOL 10 MG/ML IV BOLUS
INTRAVENOUS | Status: AC
  Filled 2020-02-10: qty 20

## 2020-02-10 MED ORDER — FERROUS SULFATE 325 (65 FE) MG PO TABS
325.0000 mg | ORAL_TABLET | Freq: Three times a day (TID) | ORAL | Status: DC
  Administered 2020-02-11 – 2020-02-12 (×3): 325 mg via ORAL
  Filled 2020-02-10 (×5): qty 1

## 2020-02-10 MED ORDER — ROCURONIUM BROMIDE 10 MG/ML (PF) SYRINGE
PREFILLED_SYRINGE | INTRAVENOUS | Status: AC
  Filled 2020-02-10: qty 10

## 2020-02-10 MED ORDER — CHLORHEXIDINE GLUCONATE 4 % EX LIQD
60.0000 mL | Freq: Once | CUTANEOUS | Status: AC
  Administered 2020-02-10: 4 via TOPICAL

## 2020-02-10 MED ORDER — CEFAZOLIN SODIUM-DEXTROSE 2-4 GM/100ML-% IV SOLN
2.0000 g | Freq: Four times a day (QID) | INTRAVENOUS | Status: AC
  Administered 2020-02-10 – 2020-02-11 (×2): 2 g via INTRAVENOUS
  Filled 2020-02-10 (×3): qty 100

## 2020-02-10 MED ORDER — ENSURE PRE-SURGERY PO LIQD
296.0000 mL | Freq: Once | ORAL | Status: DC
  Filled 2020-02-10: qty 296

## 2020-02-10 MED ORDER — 0.9 % SODIUM CHLORIDE (POUR BTL) OPTIME
TOPICAL | Status: DC | PRN
  Administered 2020-02-10: 1000 mL

## 2020-02-10 MED ORDER — ACETAMINOPHEN 10 MG/ML IV SOLN
1000.0000 mg | Freq: Once | INTRAVENOUS | Status: AC
  Administered 2020-02-10: 1000 mg via INTRAVENOUS
  Filled 2020-02-10: qty 100

## 2020-02-10 MED ORDER — STERILE WATER FOR IRRIGATION IR SOLN
Status: DC | PRN
  Administered 2020-02-10: 2000 mL

## 2020-02-10 MED ORDER — DEXAMETHASONE SODIUM PHOSPHATE 10 MG/ML IJ SOLN
INTRAMUSCULAR | Status: DC | PRN
  Administered 2020-02-10: 4 mg via INTRAVENOUS

## 2020-02-10 MED ORDER — LIDOCAINE 2% (20 MG/ML) 5 ML SYRINGE
INTRAMUSCULAR | Status: DC | PRN
  Administered 2020-02-10: 30 mg via INTRAVENOUS
  Administered 2020-02-10: 70 mg via INTRAVENOUS

## 2020-02-10 MED ORDER — ONDANSETRON HCL 4 MG/2ML IJ SOLN
INTRAMUSCULAR | Status: AC
  Filled 2020-02-10: qty 2

## 2020-02-10 MED ORDER — ONDANSETRON HCL 4 MG/2ML IJ SOLN
INTRAMUSCULAR | Status: DC | PRN
  Administered 2020-02-10: 4 mg via INTRAVENOUS

## 2020-02-10 MED ORDER — PHENOL 1.4 % MT LIQD: 1.0000 | OROMUCOSAL | Status: DC | PRN

## 2020-02-10 MED ORDER — SUGAMMADEX SODIUM 200 MG/2ML IV SOLN
INTRAVENOUS | Status: DC | PRN
  Administered 2020-02-10: 200 mg via INTRAVENOUS

## 2020-02-10 MED ORDER — ONDANSETRON HCL 4 MG PO TABS: 4.0000 mg | ORAL_TABLET | Freq: Four times a day (QID) | ORAL | Status: DC | PRN

## 2020-02-10 MED ORDER — PHENYLEPHRINE 40 MCG/ML (10ML) SYRINGE FOR IV PUSH (FOR BLOOD PRESSURE SUPPORT)
PREFILLED_SYRINGE | INTRAVENOUS | Status: AC
  Filled 2020-02-10: qty 10

## 2020-02-10 MED ORDER — POVIDONE-IODINE 10 % EX SWAB
2.0000 "application " | Freq: Once | CUTANEOUS | Status: AC
  Administered 2020-02-10: 2 via TOPICAL

## 2020-02-10 MED ORDER — MENTHOL 3 MG MT LOZG: 1.0000 | LOZENGE | OROMUCOSAL | Status: DC | PRN

## 2020-02-10 MED ORDER — ENOXAPARIN SODIUM 30 MG/0.3ML ~~LOC~~ SOLN
30.0000 mg | SUBCUTANEOUS | Status: DC
  Administered 2020-02-11: 30 mg via SUBCUTANEOUS
  Filled 2020-02-10: qty 0.3

## 2020-02-10 MED ORDER — FENTANYL CITRATE (PF) 100 MCG/2ML IJ SOLN
INTRAMUSCULAR | Status: DC | PRN
  Administered 2020-02-10 (×2): 25 ug via INTRAVENOUS
  Administered 2020-02-10: 50 ug via INTRAVENOUS

## 2020-02-10 MED ORDER — DEXAMETHASONE SODIUM PHOSPHATE 10 MG/ML IJ SOLN
INTRAMUSCULAR | Status: AC
  Filled 2020-02-10: qty 1

## 2020-02-10 MED ORDER — CEFAZOLIN SODIUM-DEXTROSE 2-4 GM/100ML-% IV SOLN
2.0000 g | INTRAVENOUS | Status: AC
  Administered 2020-02-10: 2 g via INTRAVENOUS
  Filled 2020-02-10: qty 100

## 2020-02-10 MED ORDER — ONDANSETRON HCL 4 MG/2ML IJ SOLN: 4.0000 mg | Freq: Once | INTRAMUSCULAR | Status: DC | PRN

## 2020-02-10 SURGICAL SUPPLY — 40 items
APL SKNCLS STERI-STRIP NONHPOA (GAUZE/BANDAGES/DRESSINGS) ×1
BENZOIN TINCTURE PRP APPL 2/3 (GAUZE/BANDAGES/DRESSINGS) ×3 IMPLANT
BIT DRILL CANN LG 4.3MM (BIT) IMPLANT
BNDG COHESIVE 6X5 TAN STRL LF (GAUZE/BANDAGES/DRESSINGS) ×7 IMPLANT
CLOSURE STERI-STRIP 1/2X4 (GAUZE/BANDAGES/DRESSINGS) ×1
CLOSURE WOUND 1/2 X4 (GAUZE/BANDAGES/DRESSINGS) ×2
CLSR STERI-STRIP ANTIMIC 1/2X4 (GAUZE/BANDAGES/DRESSINGS) ×2 IMPLANT
COVER PERINEAL POST (MISCELLANEOUS) ×3 IMPLANT
COVER SURGICAL LIGHT HANDLE (MISCELLANEOUS) ×5 IMPLANT
COVER WAND RF STERILE (DRAPES) ×3 IMPLANT
DRAPE STERI IOBAN 125X83 (DRAPES) ×3 IMPLANT
DRILL BIT CANN LG 4.3MM (BIT) ×3
DRSG MEPILEX BORDER 4X4 (GAUZE/BANDAGES/DRESSINGS) ×6 IMPLANT
DRSG MEPILEX BORDER 4X8 (GAUZE/BANDAGES/DRESSINGS) ×4 IMPLANT
DURAPREP 26ML APPLICATOR (WOUND CARE) ×3 IMPLANT
ELECT REM PT RETURN 15FT ADLT (MISCELLANEOUS) ×3 IMPLANT
GAUZE XEROFORM 5X9 LF (GAUZE/BANDAGES/DRESSINGS) ×3 IMPLANT
GLOVE BIO SURGEON STRL SZ7.5 (GLOVE) ×3 IMPLANT
GLOVE INDICATOR 8.0 STRL GRN (GLOVE) ×3 IMPLANT
GLOVE SURG ENC MOIS LTX SZ6.5 (GLOVE) ×6 IMPLANT
GLOVE SURG UNDER POLY LF SZ7 (GLOVE) ×3 IMPLANT
GOWN STRL REUS W/ TWL LRG LVL3 (GOWN DISPOSABLE) ×2 IMPLANT
GOWN STRL REUS W/TWL LRG LVL3 (GOWN DISPOSABLE) ×6
GUIDEPIN 3.2X17.5 THRD DISP (PIN) ×2 IMPLANT
MANIFOLD NEPTUNE II (INSTRUMENTS) ×3 IMPLANT
NAIL HIP FRACT 130D 11X180 (Screw) ×2 IMPLANT
NS IRRIG 1000ML POUR BTL (IV SOLUTION) ×3 IMPLANT
PACK GENERAL/GYN (CUSTOM PROCEDURE TRAY) ×3 IMPLANT
PAD ARMBOARD 7.5X6 YLW CONV (MISCELLANEOUS) ×6 IMPLANT
SCREW BONE CORTICAL 5.0X36 (Screw) ×2 IMPLANT
SCREW LAG HIP NAIL 10.5X95 (Screw) ×2 IMPLANT
STRIP CLOSURE SKIN 1/2X4 (GAUZE/BANDAGES/DRESSINGS) ×2 IMPLANT
SUT VIC AB 0 CT1 27 (SUTURE) ×6
SUT VIC AB 0 CT1 27XBRD ANBCTR (SUTURE) ×1 IMPLANT
SUT VIC AB 0 CT1 27XBRD ANTBC (SUTURE) IMPLANT
SUT VIC AB 3-0 SH 27 (SUTURE) ×6
SUT VIC AB 3-0 SH 27X BRD (SUTURE) ×2 IMPLANT
TOWEL OR 17X26 10 PK STRL BLUE (TOWEL DISPOSABLE) ×3 IMPLANT
TRAY FOLEY MTR SLVR 14FR STAT (SET/KITS/TRAYS/PACK) ×2 IMPLANT
WATER STERILE IRR 1000ML POUR (IV SOLUTION) ×6 IMPLANT

## 2020-02-10 NOTE — Progress Notes (Signed)
PROGRESS NOTE  Alice Wong  DOB: 11-06-1934  PCP: Cassandria Anger, MD DGU:[id number]  DOA: 02/09/2020  LOS: 1 day   Chief Complaint  Patient presents with  . Bleeding/Bruising   Brief narrative: Alice Wong is a 86 y.o. female with PMH significant for advanced dementia, HTN, HLD, CVA, depression/anxiety, history of falls who lives at [facility]. Patient was sent to the ED today for a bruise to the right hip. Per report, patient had a fall in 1/11. She was brought to ED. CT scan of head, neck and maxillofacial were unremarkable at that time.  Per report, patient was ambulatory at the time and hip x-ray was not done. She was discharged back to nursing facility. Per facility, there is no reported fall afterwords but today patient was noted to have bruise on her right hip. Patient herself is not able to elaborate any history because of dementia. Not on blood thinners.  In the ED, patient was afebrile, blood pressure was elevated to 181/84, breathing room air. Labs unremarkable except WBC count elevated to 14.7 and glucose elevated to 168 Covid PCR pending X-ray hip showed comminuted intertrochanteric right hip fracture with impaction and varus angulation. Chest x-ray did not show any acute airspace disease. CT head and CT cervical spine did not show any acute intracranial abnormality. But showed atrophy and mild chronic small vessel ischemic changes of white matter.  ED physician discussed with orthopedics Dr. Mardelle Matte.   Patient was admitted to hospitalist service.  Subjective: Patient was seen and examined this morning.  Elderly Russian-speaking female.  Sleeping.  Opens eyes on verbal command.  Unable to have a formal conversation.  She is disoriented at baseline and speaks [place] only.  No family at bedside.  Assessment/Plan: Comminuted intertrochanteric right hip fracture History of falls -Per SNF, no clear incident of fall but patient has history of  falls. -Brought in for right hip bruising.  X-ray showed comminuted intertrochanteric right hip fracture -Orthopedics consulted.    Noted a tentative plan of surgical fixation today -Remain NPO. -Continue pain management.  DVT prophylaxis with Lovenox post procedure.  Advanced dementia with behavioral symptoms History of depression/anxiety -Home meds include Haldol 1 mg 3 times daily, Namenda 10 mg twice daily, Zyprexa 10 mg daily at bedtime, trazodone 1 mg at bedtime. -Continue all. -May need additional IV Haldol if remains agitated.  QTc 387 ms  Low urine output -Per RN, no urine output in pubic overnight.  260 mL on bladder scanning this morning. -We will increase normal saline 200 mill per hour, insert Foley catheter for monitoring. -Blood pressure stable.  Cardiovascular issues: HTN, HLD, CVA -Home meds include amlodipine 10 mg daily, aspirin 81 mg daily. -Not on statin. -Continue amlodipine.  Aspirin on hold prior to procedure.  -Continue monitor blood pressure.  Mobility: PT eval postprocedure Code Status:   Code Status: Full Code  Nutritional status: Body mass index is 24.03 kg/m.     Diet Order            Diet NPO time specified  Diet effective midnight                 DVT prophylaxis: Sequential compression device to OR Start: 02/10/20 0216 Place and maintain sequential compression device Start: 02/09/20 1920   Antimicrobials:  None Fluid: Normal saline 100/h Consultants: Orthopedics Family Communication:  None at bedside  Status is: Inpatient  Remains inpatient appropriate because: Pending surgical fixation of hip  Dispo: The patient is  from: SNF              Anticipated d/c is to: SNF              Anticipated d/c date is: 3 days              Patient currently is not medically stable to d/c.   Difficult to place patient No       Infusions:  . sodium chloride 100 mL/hr at 02/10/20 1041    Scheduled Meds: . amLODipine  10 mg Oral Daily  .  feeding supplement  296 mL Oral Once  . haloperidol  1 mg Oral TID  . memantine  10 mg Oral BID  . OLANZapine  10 mg Oral QHS  . senna  1 tablet Oral BID  . traMADol  50 mg Oral BID  . traZODone  100 mg Oral QHS    Antimicrobials: Anti-infectives (From admission, onward)   None      PRN meds: acetaminophen **OR** acetaminophen, acetaminophen, albuterol, alum & mag hydroxide-simeth, bisacodyl, haloperidol lactate, hydrALAZINE, morphine injection, polyethylene glycol, sodium phosphate   Objective: Vitals:   02/10/20 0058 02/10/20 0458  BP: (!) 146/68 129/70  Pulse: 76 75  Resp: 18 18  Temp: 98.8 F (37.1 C) 99.3 F (37.4 C)  SpO2: 98% 100%    Intake/Output Summary (Last 24 hours) at 02/10/2020 1145 Last data filed at 02/10/2020 1055 Gross per 24 hour  Intake 414.17 ml  Output 0 ml  Net 414.17 ml   There were no vitals filed for this visit. Weight change:  Body mass index is 24.03 kg/m.   Physical Exam: General exam: Elderly Caucasian female.  Not in physical distress this time Skin: No rashes, lesions or ulcers. HEENT: Atraumatic, normocephalic, no obvious bleeding Lungs: Clear to auscultation bilaterally CVS: Regular rate and rhythm, no murmur GI/Abd soft, nontender, nondistended, bowel sound present CNS: Sleeping, tries to open eyes on verbal command, not restless or agitated Psychiatry: Depressed look Extremities: No pedal edema, no calf tenderness  Data Review: I have personally reviewed the laboratory data and studies available.  Recent Labs  Lab 02/09/20 1430 02/10/20 0328  WBC 14.7* 9.9  NEUTROABS 13.3*  --   HGB 12.5 10.4*  HCT 35.4* 30.2*  MCV 91.9 94.1  PLT 208 158   Recent Labs  Lab 02/09/20 1430 02/10/20 0328  NA 139 138  K 3.6 3.4*  CL 100 103  CO2 27 24  GLUCOSE 168* 156*  BUN 21 27*  CREATININE 0.75 0.95  CALCIUM 9.3 8.8*    F/u labs ordered  Signed, Terrilee Croak, MD [facility] 02/10/2020

## 2020-02-10 NOTE — Transfer of Care (Signed)
Immediate Anesthesia Transfer of Care Note  Patient: Alice Wong  Procedure(s) Performed: INTRAMEDULLARY (IM) NAIL INTERTROCHANTRIC (Right )  Patient Location: PACU  Anesthesia Type:General  Level of Consciousness: drowsy  Airway & Oxygen Therapy: Patient Spontanous Breathing and Patient connected to face mask oxygen  Post-op Assessment: Report given to RN and Post -op Vital signs reviewed and stable  Post vital signs: Reviewed and stable  Last Vitals:  Vitals Value Taken Time  BP 160/60 02/10/20 1708  Temp    Pulse 65 02/10/20 1712  Resp 19 02/10/20 1712  SpO2 95 % 02/10/20 1712  Vitals shown include unvalidated device data.  Last Pain:  Vitals:   02/10/20 0458  TempSrc: Oral  PainSc:          Complications: No complications documented.

## 2020-02-10 NOTE — Discharge Instructions (Signed)
Diet: As you were doing prior to hospitalization   Shower:  May shower but keep the wounds dry, use an occlusive plastic wrap, NO SOAKING IN TUB.  If the bandage gets wet, change with a clean dry gauze.   Dressing:  You may change your dressing 3-5 days after surgery. There are sticky tapes (steri-strips) on your wounds and all the stitches are absorbable.  Leave the steri-strips in place when changing your dressings, they will peel off with time, usually 2-3 weeks.  Activity:  Increase activity slowly as tolerated, but follow the weight bearing instructions below.     Weight Bearing:   Can weight bear as tolerated on right leg  To prevent constipation: you may use a stool softener such as -  Colace (over the counter) 100 mg by mouth twice a day  Drink plenty of fluids (prune juice may be helpful) and high fiber foods Miralax (over the counter) for constipation as needed.    Itching:  If you experience itching with your medications, try taking only a single pain pill, or even half a pain pill at a time.  You may take up to 10 pain pills per day, and you can also use benadryl over the counter for itching or also to help with sleep.   Precautions:  If you experience chest pain or shortness of breath - call 911 immediately for transfer to the hospital emergency department!!  If you develop a fever greater that 101 F, purulent drainage from wound, increased redness or drainage from wound, or calf pain -- Call the office at [phone]                                                Follow- Up Appointment:  Please call our office for an appointment to be seen by Dr. Mardelle Matte in 2 weeks at our [facility] location- [phone]

## 2020-02-10 NOTE — Anesthesia Preprocedure Evaluation (Addendum)
Anesthesia Evaluation  Patient identified by MRN, date of birth, ID band Patient unresponsive  General Assessment Comment:Advanced dementia  Reviewed: Allergy & Precautions, NPO status , Patient's Chart, lab work & pertinent test results  Airway Mallampati: II  TM Distance: >3 FB Neck ROM: Full    Dental  (+) Teeth Intact, Dental Advisory Given   Pulmonary neg pulmonary ROS,    Pulmonary exam normal breath sounds clear to auscultation       Cardiovascular hypertension, Pt. on medications Normal cardiovascular exam+ Valvular Problems/Murmurs MR  Rhythm:Regular Rate:Normal     Neuro/Psych  Headaches, PSYCHIATRIC DISORDERS Anxiety Depression Dementia CVA    GI/Hepatic Neg liver ROS, GERD  ,  Endo/Other  diabetes, Type 2, Oral Hypoglycemic Agents  Renal/GU Renal disease     Musculoskeletal  RIGHT INTERTROCHANTERIC HIP FRACTURE   Abdominal   Peds  Hematology  (+) Blood dyscrasia, anemia ,   Anesthesia Other Findings Day of surgery medications reviewed with the patient.  Reproductive/Obstetrics                           Anesthesia Physical Anesthesia Plan  ASA: IV  Anesthesia Plan: General   Post-op Pain Management:    Induction: Intravenous  PONV Risk Score and Plan: 3 and Dexamethasone, Ondansetron and Treatment may vary due to age or medical condition  Airway Management Planned: Oral ETT  Additional Equipment:   Intra-op Plan:   Post-operative Plan: Extubation in OR  Informed Consent: I have reviewed the patients History and Physical, chart, labs and discussed the procedure including the risks, benefits and alternatives for the proposed anesthesia with the patient or authorized representative who has indicated his/her understanding and acceptance.     Dental advisory given, Consent reviewed with POA and History available from chart only  Plan Discussed with: CRNA  Anesthesia  Plan Comments: (Telephone consent with patient's daughter. All questions answered. )       Anesthesia Quick Evaluation

## 2020-02-10 NOTE — Anesthesia Procedure Notes (Signed)
Procedure Name: Intubation Performed by: Milford Cage, CRNA Pre-anesthesia Checklist: Patient identified, Emergency Drugs available, Suction available and Patient being monitored Patient Re-evaluated:Patient Re-evaluated prior to induction Oxygen Delivery Method: Circle System Utilized Preoxygenation: Pre-oxygenation with 100% oxygen Induction Type: IV induction Ventilation: Mask ventilation without difficulty Laryngoscope Size: Mac and 3 Grade View: Grade II Tube type: Oral Tube size: 7.0 mm Number of attempts: 2 Airway Equipment and Method: Stylet and Oral airway Placement Confirmation: ETT inserted through vocal cords under direct vision,  positive ETCO2 and breath sounds checked- equal and bilateral Secured at: 21 cm Tube secured with: Tape Dental Injury: Teeth and Oropharynx as per pre-operative assessment  Comments: G3 with Mil2. G2 with MaC3 - successful intubation

## 2020-02-10 NOTE — Progress Notes (Signed)
The risks benefits and alternatives were discussed with the patient and her daughter including but not limited to the risks of nonoperative treatment, versus surgical intervention including infection, bleeding, nerve injury, malunion, nonunion, the need for revision surgery, hardware prominence, hardware failure, the need for hardware removal, blood clots, cardiopulmonary complications, morbidity, mortality, among others, and they were willing to proceed.    Plan right hip IM nail.    Johnny Bridge, MD

## 2020-02-10 NOTE — Op Note (Signed)
DATE OF SURGERY:  02/10/2020  TIME: 4:29 PM  PATIENT NAME:  Alice Wong  AGE: 86 y.o.  PRE-OPERATIVE DIAGNOSIS:  RIGHT INTERTROCHANTERIC HIP FRACTURE  POST-OPERATIVE DIAGNOSIS:  SAME  PROCEDURE:  INTRAMEDULLARY (IM) NAIL INTERTROCHANTRIC  SURGEON:  Lenetta Quaker Mette [place]  ASSISTANT:  Aggie Moats, PA-C, present and scrubbed throughout the case, critical for assistance with exposure, retraction, instrumentation, and closure.  OPERATIVE IMPLANTS: Biomet Affixus short 11 femoral nail with a 95 cephalomedullary lag screw for the femoral head, and a size 36 distal interlocking bolt.  UNIQUE ASPECTS OF THE CASE:  The fracture reduced essentially anatomically with traction and slight internal rotation.    ESTIMATED BLOOD LOSS: 81ml.  PREOPERATIVE INDICATIONS:  [name] is a 86 y.o. year old who fell and suffered a hip fracture. She was brought into the ER and then admitted and optimized and then elected for surgical intervention.    The risks benefits and alternatives were discussed with the patient and her family given her severe dementia including but not limited to the risks of nonoperative treatment, versus surgical intervention including infection, bleeding, nerve injury, malunion, nonunion, hardware prominence, hardware failure, need for hardware removal, blood clots, cardiopulmonary complications, morbidity, mortality, among others, and they were willing to proceed.    OPERATIVE PROCEDURE:  The patient was brought to the operating room and placed in the supine position. General anesthesia was administered. She was placed on the fracture table.  Closed reduction was performed under C-arm guidance.  Time out was then performed after sterile prep and drape. She received preoperative antibiotics.  Incision was made proximal to the greater trochanter. A guidewire was placed in the appropriate position. Confirmation was made on AP and lateral views.  The above-named nail was opened. I  opened the proximal femur with a reamer. I then placed the nail by hand down. I did not need to ream the femur.  Once the nail was completely seated, I placed a guidepin into the femoral head into the center center position. I measured the length, and then reamed the lateral cortex and up into the head. I then placed the cephalomedullary screw. Anatomic fixation achieved. Bone quality was mediocre.  I then secured the proximal interlocking bolt, and locked the nail distally using the jig.  I took final C-arm pictures AP and lateral.   Anatomic reconstruction was achieved, and the wounds were irrigated copiously and closed with Vicryl followed by Steri-Strips and sterile gauze for the skin. The patient was awakened and returned to PACU in stable and satisfactory condition. There were no complications and the patient tolerated the procedure well.  She will be weightbearing as tolerated, and will be on Lovenox  for a period of four weeks after discharge.   Marchia Bond, M.D.

## 2020-02-10 NOTE — Progress Notes (Signed)
Patient to peri op from room 1345. She does not make eye contact when RN addresses her. Speaks only russian. Her daughter, [name], has given permission for surgery. No urine output via periwick . Foley to be placed in OR. PO tylenol changed to IV as patient is not able to follow directions to take PO.

## 2020-02-11 ENCOUNTER — Encounter (HOSPITAL_COMMUNITY): Payer: Self-pay | Admitting: Orthopedic Surgery

## 2020-02-11 DIAGNOSIS — S72001A Fracture of unspecified part of neck of right femur, initial encounter for closed fracture: Secondary | ICD-10-CM | POA: Diagnosis not present

## 2020-02-11 LAB — BASIC METABOLIC PANEL
Anion gap: 9 (ref 5–15)
BUN: 31 mg/dL — ABNORMAL HIGH (ref 8–23)
CO2: 25 mmol/L (ref 22–32)
Calcium: 8.2 mg/dL — ABNORMAL LOW (ref 8.9–10.3)
Chloride: 105 mmol/L (ref 98–111)
Creatinine, Ser: 0.82 mg/dL (ref 0.44–1.00)
GFR, Estimated: >60 mL/min (ref >60)
Glucose, Bld: 141 mg/dL — ABNORMAL HIGH (ref 70–99)
Potassium: 3.8 mmol/L (ref 3.5–5.1)
Sodium: 139 mmol/L (ref 135–145)

## 2020-02-11 LAB — CBC
HCT: 24.3 % — ABNORMAL LOW (ref 36.0–46.0)
Hemoglobin: 8.4 g/dL — ABNORMAL LOW (ref 12.0–15.0)
MCH: 32.6 pg (ref 26.0–34.0)
MCHC: 34.6 g/dL (ref 30.0–36.0)
MCV: 94.2 fL (ref 80.0–100.0)
Platelets: 154 10*3/uL (ref 150–400)
RBC: 2.58 MIL/uL — ABNORMAL LOW (ref 3.87–5.11)
RDW: 12.7 % (ref 11.5–15.5)
WBC: 7.7 10*3/uL (ref 4.0–10.5)
nRBC: 0 % (ref 0.0–0.2)

## 2020-02-11 MED ORDER — ENOXAPARIN SODIUM 40 MG/0.4ML ~~LOC~~ SOLN
40.0000 mg | SUBCUTANEOUS | Status: DC
  Administered 2020-02-12: 40 mg via SUBCUTANEOUS
  Filled 2020-02-11: qty 0.4

## 2020-02-11 NOTE — Progress Notes (Addendum)
PASRR Dementia Note.   Patient Details Name: Alice Wong  MRN: [id number] DOB: 1934/04/12     [place] MUST ID# [id number]      To Whom It May Concern:      Please be advised that the above-named patient has a primary diagnosis of dementia which supersedes any psychiatric diagnosis.

## 2020-02-11 NOTE — TOC Initial Note (Signed)
Transition of Care [facility]) - Initial/Assessment Note    Patient Details  Name: Alice Wong MRN: [id number] Date of Birth: 1934/12/30  Transition of Care [facility]) CM/SW Contact:    Lia Hopping, Maurice Phone Number: 02/11/2020, 11:30 AM  Clinical Narrative:  Patient admitted after a fall and hip fracture.              Patient has hx of dementia and is a resident at [facility].  CSW reached out to the daughter [name] to discuss the recommendation for rehab at [facility]. Daughter agreeable to this plan. CSW explain the SNF process and will follow up with bed offers. Daughter reports the patient usually walks independent without support.  FL2 completed.  PASRR manuel review Patient vaccinated.   Expected Discharge Plan: Skilled Nursing Facility Barriers to Discharge: Continued Medical Work up   Patient Goals and CMS Choice   CMS Medicare.gov Compare Post Acute Care list provided to:: Other (Comment Required) (Andreescu,Olga Daughter [phone]) Choice offered to / list presented to : Adult Children  Expected Discharge Plan and Services Expected Discharge Plan: [place] In-house Referral: Clinical Social Work Discharge Planning Services: CM Consult Post Acute Care Choice: [place] Living arrangements for the past 2 months:  (Memory Care)                                      Prior Living Arrangements/Services Living arrangements for the past 2 months:  (Memory Care) Lives with:: Facility Resident Patient language and need for interpreter reviewed:: Yes Do you feel safe going back to the place where you live?: Yes      Need for Family Participation in Patient Care: Yes (Comment) Care giver support system in place?: Yes (comment)   Criminal Activity/Legal Involvement Pertinent to Current Situation/Hospitalization: No - Comment as needed  Activities of Daily Living Home Assistive Devices/Equipment: None ADL Screening  (condition at time of admission) Patient's cognitive ability adequate to safely complete daily activities?: No Is the patient deaf or have difficulty hearing?: No Does the patient have difficulty seeing, even when wearing glasses/contacts?: No Does the patient have difficulty concentrating, remembering, or making decisions?: Yes Patient able to express need for assistance with ADLs?: No Does the patient have difficulty dressing or bathing?: Yes Independently performs ADLs?: No Communication: Needs assistance Is this a change from baseline?: Pre-admission baseline Dressing (OT): Needs assistance Is this a change from baseline?: Pre-admission baseline Grooming: Needs assistance Is this a change from baseline?: Pre-admission baseline Feeding: Needs assistance Is this a change from baseline?: Pre-admission baseline Bathing: Needs assistance Is this a change from baseline?: Pre-admission baseline Toileting: Needs assistance Is this a change from baseline?: Pre-admission baseline In/Out Bed: Needs assistance Is this a change from baseline?: Pre-admission baseline Walks in Home: Needs assistance Is this a change from baseline?: Pre-admission baseline Does the patient have difficulty walking or climbing stairs?: Yes Weakness of Legs: Both Weakness of Arms/Hands: Both  Permission Sought/Granted Permission sought to share information with : Family Chief Financial Officer    Share Information with NAME: Andreescu,Olga     Permission granted to share info w Relationship: Daughter  Permission granted to share info w Contact Information: [phone]  Emotional Assessment Appearance:: Appears stated age Attitude/Demeanor/Rapport: Unable to Assess Affect (typically observed): Unable to Assess Orientation: : Oriented to Self,Oriented to Place,Oriented to  Time,Oriented to Situation Alcohol / Substance Use: Not  Applicable Psych Involvement: No (comment)  Admission  diagnosis:  Hip fracture ([place]) [S72.009A] Closed fracture of right hip, initial encounter [facility]) [S72.001A] Patient Active Problem List   Diagnosis Date Noted  . Hip fracture ([place]) 02/09/2020  . Agitation 12/09/2017  . Paranoia ([place])   . Incontinence of bowel 08/15/2017  . Chest wall contusion 02/21/2017  . Diarrhea 09/22/2015  . Redness of eye, right 02/15/2015  . Noncompliance with medication regimen 11/15/2014  . Dementia with behavioral disturbance ([place]) 07/28/2014  . Cerebral infarction ([place]) 07/21/2014  . Sternum pain 04/21/2014  . Costochondritis 04/13/2014  . Simple chronic bronchitis ([place]) 04/13/2014  . Cough 03/22/2014  . S/P cardiac cath 12/20/13, non obsteructive disease 12/20/2013  . Abnormal nuclear stress test, false positive per cath   . Vasovagal syncope 12/17/2013  . Midsternal chest pain 12/17/2013  . Neoplasm of uncertain behavior of skin 10/27/2013  . Uncontrolled type 2 diabetes with renal manifestation ([place]) 11/04/2012  . Pain of right thumb 12/20/2011  . Psychosomatic disorder 08/23/2011  . Tinnitus 06/04/2011  . Headache, post-traumatic, chronic 05/01/2011  . LBP (low back pain) 01/30/2011  . Insomnia 11/19/2010  . Actinic keratoses 05/23/2010  . Dizziness 04/09/2010  . Bruising 04/09/2010  . Thrombocytopenia ([place]) 01/02/2010  . HEMORRHOIDS 12/12/2009  . HELICOBACTER PYLORI INFECTION 03/17/2009  . PARESTHESIA 03/17/2009  . Chest pain, unspecified 08/01/2008  . Vitamin D deficiency 03/03/2008  . GAIT IMBALANCE 10/22/2007  . Gout, unspecified 05/07/2007  . FOOT PAIN 05/07/2007  . Dyslipidemia 02/05/2007  . Anxiety state 02/05/2007  . Depression with anxiety 02/05/2007  . Essential hypertension 02/05/2007  . GERD 02/05/2007  . OSTEOPOROSIS 02/05/2007  . Memory loss 02/05/2007  . History of cardiovascular disorder 02/05/2007   PCP:  Plotnikov, Evie Lacks, MD Pharmacy:   [id number] - [place], [place] - [address] [place] [address] Phone: [phone] Fax: [phone]     Social Determinants of Health (SDOH) Interventions    Readmission Risk Interventions No flowsheet data found.

## 2020-02-11 NOTE — Progress Notes (Signed)
    Subjective: Patient has advanced dementia and is non-verbal thus not able to contribute any information about how she is feeling. She is resting comfortably in bed. Tolerating diet. Urinating. Has not seen PT/OT yet for mobilization based on notes.   Objective:   VITALS:   Vitals:   02/10/20 2223 02/11/20 0214 02/11/20 0623 02/11/20 0649  BP: 123/73 (!) 122/55 (!) 122/56   Pulse: 70 66 75   Resp: 18 16 17    Temp: 98.1 F (36.7 C) 98.4 F (36.9 C) 98.9 F (37.2 C)   TempSrc: Oral Oral Oral   SpO2: 100% 91% (!) 83% 96%  Height:       CBC Latest Ref Rng & Units 02/11/2020 02/10/2020 02/09/2020  WBC 4.0 - 10.5 K/uL 7.7 9.9 14.7(H)  Hemoglobin 12.0 - 15.0 g/dL 8.4(L) 10.4(L) 12.5  Hematocrit 36.0 - 46.0 % 24.3(L) 30.2(L) 35.4(L)  Platelets 150 - 400 K/uL 154 158 208   BMP Latest Ref Rng & Units 02/10/2020 02/09/2020 12/01/2017  Glucose 70 - 99 mg/dL 156(H) 168(H) 180(H)  BUN 8 - 23 mg/dL 27(H) 21 28(H)  Creatinine 0.44 - 1.00 mg/dL 0.95 0.75 0.95  Sodium 135 - 145 mmol/L 138 139 142  Potassium 3.5 - 5.1 mmol/L 3.4(L) 3.6 3.7  Chloride 98 - 111 mmol/L 103 100 105  CO2 22 - 32 mmol/L 24 27 28   Calcium 8.9 - 10.3 mg/dL 8.8(L) 9.3 9.2   Intake/Output      01/27 0701 01/28 0700 01/28 0701 01/29 0700   I.V. 2148.8    IV Piggyback 500    Total Intake 2648.8    Urine 825    Blood 100    Total Output 925    Net +1723.8            Physical Exam: General: NAD. Laying in bed sleeping comfortably.  Resp: CTAB, No increased wob Cardio: RRR ABD: soft Skin: ecchymosis and healing laceration on face MSK Neurovascularly intact Unable to assess if sensation intact distally as patient did not move when I touched her feet Intact pulses distally Incisions: covered with dressings that appear C/D/I with minimal spots of dried blood underneath them Unable to assess ROM of extremities Patient did not wince or react when her extremities were palpated including the area of her  bandages Ecchymosis still present on Right lateral hip  Assessment: 1 Day Post-Op  S/P Procedure(s) (LRB): INTRAMEDULLARY (IM) NAIL INTERTROCHANTRIC (Right) by Dr. Marchia Bond on 02/10/20  Principal Problem:   Hip fracture ([place])   Plan:  Advance diet Up with therapy Incentive Spirometry Elevate and Apply ice Got a current weight on her and thus increased her Lovenox from 30mg  to 40mg   Weightbearing: WBAT RLE Insicional and dressing care: Reinforce dressings as needed Orthopedic device(s): None Showering: Keep dressing dry VTE prophylaxis: Lovenox 40mg  qd during hospital stay, SCDs, ambulation Pain control: Tylenol and Morphine PRN, Tramadol bid Follow - up plan: In office 2 weeks after discharge Contact information:  Marchia Bond MD, Aggie Moats PA-C  Dispo: Skilled Nursing Facility/Rehab  POA is daughter [name] [phone]    Britt Bottom, PA-C 02/11/2020, 8:25 AM

## 2020-02-11 NOTE — NC FL2 (Addendum)
[place] LEVEL OF CARE SCREENING TOOL     IDENTIFICATION  Patient Name: Alice Wong Birthdate: 02-08-1934 Sex: female Admission Date (Current Location): 02/09/2020  [facility] and [place] Number:  Herbalist and Address:  [facility],  [place] [address], [place]      Provider Number: [id number]  Attending Physician Name and Address:  Terrilee Croak, MD  Relative Name and Phone Number:  Sherrilee Gilles Daughter [phone]    Current Level of Care: Hospital Recommended Level of Care: [place] Prior Approval Number:    Date Approved/Denied:   PASRR Number:    Discharge Plan: SNF    Current Diagnoses: Patient Active Problem List   Diagnosis Date Noted  . Hip fracture ([place]) 02/09/2020  . Agitation 12/09/2017  . Paranoia ([place])   . Incontinence of bowel 08/15/2017  . Chest wall contusion 02/21/2017  . Diarrhea 09/22/2015  . Redness of eye, right 02/15/2015  . Noncompliance with medication regimen 11/15/2014  . Dementia with behavioral disturbance ([place]) 07/28/2014  . Cerebral infarction ([place]) 07/21/2014  . Sternum pain 04/21/2014  . Costochondritis 04/13/2014  . Simple chronic bronchitis ([place]) 04/13/2014  . Cough 03/22/2014  . S/P cardiac cath 12/20/13, non obsteructive disease 12/20/2013  . Abnormal nuclear stress test, false positive per cath   . Vasovagal syncope 12/17/2013  . Midsternal chest pain 12/17/2013  . Neoplasm of uncertain behavior of skin 10/27/2013  . Uncontrolled type 2 diabetes with renal manifestation ([place]) 11/04/2012  . Pain of right thumb 12/20/2011  . Psychosomatic disorder 08/23/2011  . Tinnitus 06/04/2011  . Headache, post-traumatic, chronic 05/01/2011  . LBP (low back pain) 01/30/2011  . Insomnia 11/19/2010  . Actinic keratoses 05/23/2010  . Dizziness 04/09/2010  . Bruising 04/09/2010  . Thrombocytopenia ([place]) 01/02/2010  . HEMORRHOIDS 12/12/2009  . HELICOBACTER PYLORI  INFECTION 03/17/2009  . PARESTHESIA 03/17/2009  . Chest pain, unspecified 08/01/2008  . Vitamin D deficiency 03/03/2008  . GAIT IMBALANCE 10/22/2007  . Gout, unspecified 05/07/2007  . FOOT PAIN 05/07/2007  . Dyslipidemia 02/05/2007  . Anxiety state 02/05/2007  . Depression with anxiety 02/05/2007  . Essential hypertension 02/05/2007  . GERD 02/05/2007  . OSTEOPOROSIS 02/05/2007  . Memory loss 02/05/2007  . History of cardiovascular disorder 02/05/2007    Orientation RESPIRATION BLADDER Height & Weight     Self  Normal Incontinent Weight:   Height:  5\' 4"  (162.6 cm)  BEHAVIORAL SYMPTOMS/MOOD NEUROLOGICAL BOWEL NUTRITION STATUS      Incontinent  (Regular)  AMBULATORY STATUS COMMUNICATION OF NEEDS Skin   Extensive Assist Verbally Surgical wounds (Incision, Pressure injury)                       Personal Care Assistance Level of Assistance  Bathing,Feeding,Dressing Bathing Assistance: Maximum assistance Feeding assistance: Maximum assistance Dressing Assistance: Maximum assistance     Functional Limitations Info  Sight,Hearing,Speech Sight Info: Adequate Hearing Info: Adequate Speech Info: Adequate    SPECIAL CARE FACTORS FREQUENCY  PT (By licensed PT),OT (By licensed OT)     PT Frequency: 5X/WEEK OT Frequency: 5X/WEEK            Contractures Contractures Info: Not present    Additional Factors Info  Psychotropic Code Status Info: Fullcode Allergies Info: Allergies: Aricept (Donepezil Hcl), Coreg (Carvedilol), Morphine Sulfate           Current Medications (02/11/2020):  This is the current hospital active medication list Current Facility-Administered Medications  Medication Dose  Route Frequency Provider Last Rate Last Admin  . 0.9 %  sodium chloride infusion   Intravenous Continuous Ventura Bruns, PA-C 100 mL/hr at 02/10/20 2117 New Bag at 02/10/20 2117  . acetaminophen (TYLENOL) tablet 650 mg  650 mg Oral Q6H PRN Merlene Pulling K, PA-C       Or   . acetaminophen (TYLENOL) suppository 650 mg  650 mg Rectal Q6H PRN Merlene Pulling K, PA-C   650 mg at 02/09/20 2301  . albuterol (PROVENTIL) (2.5 MG/3ML) 0.083% nebulizer solution 2.5 mg  2.5 mg Nebulization Q6H PRN Merlene Pulling K, PA-C      . alum & mag hydroxide-simeth (MAALOX/MYLANTA) 200-200-20 MG/5ML suspension 30 mL  30 mL Oral Daily PRN Merlene Pulling K, PA-C      . amLODipine (NORVASC) tablet 10 mg  10 mg Oral Daily Merlene Pulling K, PA-C   10 mg at 02/11/20 1051  . bisacodyl (DULCOLAX) EC tablet 5 mg  5 mg Oral Daily PRN Merlene Pulling K, PA-C      . enoxaparin (LOVENOX) injection 30 mg  30 mg Subcutaneous Q24H Brown, Blaine K, PA-C   30 mg at 02/11/20 1051  . ferrous sulfate tablet 325 mg  325 mg Oral TID PC Brown, Blaine K, PA-C   325 mg at 02/11/20 1051  . haloperidol (HALDOL) tablet 1 mg  1 mg Oral TID Merlene Pulling K, PA-C   1 mg at 02/11/20 1051  . haloperidol lactate (HALDOL) injection 2 mg  2 mg Intravenous Q6H PRN Merlene Pulling K, PA-C      . hydrALAZINE (APRESOLINE) injection 10 mg  10 mg Intravenous Q6H PRN Merlene Pulling K, PA-C      . memantine (NAMENDA) tablet 10 mg  10 mg Oral BID Merlene Pulling K, PA-C   10 mg at 02/11/20 1051  . menthol-cetylpyridinium (CEPACOL) lozenge 3 mg  1 lozenge Oral PRN Merlene Pulling K, PA-C       Or  . phenol (CHLORASEPTIC) mouth spray 1 spray  1 spray Mouth/Throat PRN Merlene Pulling K, PA-C      . morphine 2 MG/ML injection 1 mg  1 mg Intravenous Q4H PRN Merlene Pulling K, PA-C      . OLANZapine (ZYPREXA) tablet 10 mg  10 mg Oral QHS Merlene Pulling K, PA-C   10 mg at 02/10/20 2257  . ondansetron (ZOFRAN) tablet 4 mg  4 mg Oral Q6H PRN Merlene Pulling K, PA-C       Or  . ondansetron [facility]) injection 4 mg  4 mg Intravenous Q6H PRN Merlene Pulling K, PA-C      . polyethylene glycol (MIRALAX / GLYCOLAX) packet 17 g  17 g Oral Daily PRN Merlene Pulling K, PA-C      . senna (SENOKOT) tablet 8.6 mg  1 tablet Oral BID Merlene Pulling K, PA-C   8.6 mg at 02/11/20  1051  . sodium phosphate (FLEET) 7-19 GM/118ML enema 1 enema  1 enema Rectal Once PRN Merlene Pulling K, PA-C      . traMADol Veatrice Bourbon) tablet 50 mg  50 mg Oral BID Merlene Pulling K, PA-C   50 mg at 02/11/20 1051  . traZODone (DESYREL) tablet 100 mg  100 mg Oral QHS Merlene Pulling K, PA-C   100 mg at 02/10/20 2257     Discharge Medications: Please see discharge summary for a list of discharge medications.  Relevant Imaging Results:  Relevant Lab Results:   Additional Information SSN: [id number]  Lia Hopping,  LCSW

## 2020-02-11 NOTE — Progress Notes (Signed)
PROGRESS NOTE  Alice Wong  DOB: [birth year]  PCP: Cassandria Anger, MD ZOX:[id number]  DOA: 02/09/2020  LOS: 2 days   Chief Complaint  Patient presents with  . Bleeding/Bruising   Brief narrative: Alice Wong is a 86 y.o. female with PMH significant for advanced dementia, HTN, HLD, CVA, depression/anxiety, history of falls who lives at [facility]. Patient was sent to the ED today for a bruise to the right hip. Per report, patient had a fall in 1/11. She was brought to ED. CT scan of head, neck and maxillofacial were unremarkable at that time.  Per report, patient was ambulatory at the time and hip x-ray was not done. She was discharged back to nursing facility. Per facility, there is no reported fall afterwords but today patient was noted to have bruise on her right hip. Patient herself is not able to elaborate any history because of dementia. Not on blood thinners.  In the ED, patient was afebrile, blood pressure was elevated to 181/84, breathing room air. Labs unremarkable except WBC count elevated to 14.7 and glucose elevated to 168 Covid PCR pending X-ray hip showed comminuted intertrochanteric right hip fracture with impaction and varus angulation. Chest x-ray did not show any acute airspace disease. CT head and CT cervical spine did not show any acute intracranial abnormality. But showed atrophy and mild chronic small vessel ischemic changes of white matter.  ED physician discussed with orthopedics Dr. Mardelle Matte.   Patient was admitted to hospitalist service.  Seen by orthopedics Dr. Mardelle Matte for consultation. 1/27, patient underwent intramedullary nailing of right intertrochanteric fracture.  Subjective: Patient was seen and examined this morning.  Sleeping, tries to open eyes on verbal command.  Not in pain. Per RN, she tried a conversation with her earlier with a [place] interpreter.  Patient is disoriented at baseline.  Assessment/Plan: Comminuted  intertrochanteric right hip fracture History of falls -Per SNF, no clear incident of fall but patient has history of falls. -Brought in for right hip bruising.  X-ray showed comminuted intertrochanteric right hip fracture -Seen by orthopedics Dr. Mardelle Matte for consultation. -1/27, patient underwent intramedullary nailing of right intertrochanteric fracture. -Continue pain management.  DVT prophylaxis with Lovenox  Advanced dementia with behavioral symptoms History of depression/anxiety -Home meds include Haldol 1 mg 3 times daily, Namenda 10 mg twice daily, Zyprexa 10 mg daily at bedtime, trazodone 1 mg at bedtime. -Continue all. -May need additional IV Haldol if remains agitated.  QTc 387 ms  Low urine output -Improved urine output after IV fluid was started and monitored with Foley catheter placement. -Reduce IV fluid rate.  Plan to remove Foley catheter tomorrow morning.  Cardiovascular issues: HTN, HLD, CVA -Home meds include amlodipine 10 mg daily, aspirin 81 mg daily. -Not on statin. -Continue amlodipine.  Aspirin to resume at discharge. -Continue monitor blood pressure.  Mobility: PT eval postprocedure Code Status:   Code Status: Full Code  Nutritional status: Body mass index is 24.89 kg/m.     Diet Order            Diet regular Room service appropriate? Yes; Fluid consistency: Thin  Diet effective now                 DVT prophylaxis: enoxaparin (LOVENOX) injection 40 mg Start: 02/12/20 0800 SCDs Start: 02/10/20 1919 Place and maintain sequential compression device Start: 02/09/20 1920   Antimicrobials:  None Fluid: Normal saline 50 mill per hour Consultants: Orthopedics Family Communication:  None at bedside  Status  is: Inpatient  Remains inpatient appropriate because: Pending SNF placement  Dispo: The patient is from: SNF              Anticipated d/c is to: SNF              Anticipated d/c date is: 3 days              Patient currently is not medically  stable to d/c.   Difficult to place patient No   Infusions:  . sodium chloride 100 mL/hr at 02/10/20 2117    Scheduled Meds: . amLODipine  10 mg Oral Daily  . [START ON 02/12/2020] enoxaparin (LOVENOX) injection  40 mg Subcutaneous Q24H  . ferrous sulfate  325 mg Oral TID PC  . haloperidol  1 mg Oral TID  . memantine  10 mg Oral BID  . OLANZapine  10 mg Oral QHS  . senna  1 tablet Oral BID  . traMADol  50 mg Oral BID  . traZODone  100 mg Oral QHS    Antimicrobials: Anti-infectives (From admission, onward)   Start     Dose/Rate Route Frequency Ordered Stop   02/10/20 2200  ceFAZolin (ANCEF) IVPB 2g/100 mL premix        2 g 200 mL/hr over 30 Minutes Intravenous Every 6 hours 02/10/20 1918 02/11/20 0450   02/10/20 1400  ceFAZolin (ANCEF) IVPB 2g/100 mL premix        2 g 200 mL/hr over 30 Minutes Intravenous On call to O.R. 02/10/20 1345 02/10/20 1540      PRN meds: acetaminophen **OR** acetaminophen, albuterol, alum & mag hydroxide-simeth, bisacodyl, haloperidol lactate, hydrALAZINE, menthol-cetylpyridinium **OR** phenol, morphine injection, ondansetron **OR** ondansetron (ZOFRAN) IV, polyethylene glycol, sodium phosphate   Objective: Vitals:   02/11/20 0916 02/11/20 1312  BP: (!) 135/46 104/61  Pulse: 69 75  Resp: 16 17  Temp: (!) 97.5 F (36.4 C) 97.8 F (36.6 C)  SpO2: 93% (!) 89%    Intake/Output Summary (Last 24 hours) at 02/11/2020 1355 Last data filed at 02/11/2020 1100 Gross per 24 hour  Intake 2698.8 ml  Output 1075 ml  Net 1623.8 ml   Filed Weights   02/11/20 1220  Weight: 65.8 kg   Weight change:  Body mass index is 24.89 kg/m.   Physical Exam: General exam: Elderly Caucasian female. Not in physical distress this time Skin: No rashes, lesions or ulcers. HEENT: Atraumatic, normocephalic, no obvious bleeding Lungs: Clear to auscultation bilaterally CVS: Regular rate and rhythm, no murmur GI/Abd soft, nontender, nondistended, positive  present CNS: Sleeping, tries to open eyes on verbal command, not restless or agitated Psychiatry: Depressed look Extremities: No pedal edema, no calf tenderness  Data Review: I have personally reviewed the laboratory data and studies available.  Recent Labs  Lab 02/09/20 1430 02/10/20 0328 02/11/20 0807  WBC 14.7* 9.9 7.7  NEUTROABS 13.3*  --   --   HGB 12.5 10.4* 8.4*  HCT 35.4* 30.2* 24.3*  MCV 91.9 94.1 94.2  PLT 208 158 154   Recent Labs  Lab 02/09/20 1430 02/10/20 0328 02/11/20 0807  NA 139 138 139  K 3.6 3.4* 3.8  CL 100 103 105  CO2 27 24 25   GLUCOSE 168* 156* 141*  BUN 21 27* 31*  CREATININE 0.75 0.95 0.82  CALCIUM 9.3 8.8* 8.2*    F/u labs ordered  Signed, Terrilee Croak, MD [facility] 02/11/2020

## 2020-02-11 NOTE — Evaluation (Signed)
Physical Therapy Evaluation Patient Details Name: Alice Wong MRN: [id number] DOB: February 15, 1934 Today's Date: 02/11/2020   History of Present Illness  Pt admitted from memory unit with R hip fx and now s/p R IM nail.  Pt with hx of dementia. CVA and vaso-vagal reaction  Clinical Impression  Pt admitted as above and presenting with functional mobility limitations 2* decreased R LE strength/ROM, post op pain and dementia related cognitive deficits.  Pt currently requires significant assist of two for performance of all mobility tasks and would benefit from follow up rehab at SNF level to maximize IND and safety.    Follow Up Recommendations SNF    Equipment Recommendations  None recommended by PT    Recommendations for Other Services       Precautions / Restrictions Precautions Precautions: Fall Restrictions Weight Bearing Restrictions: No Other Position/Activity Restrictions: WBAT      Mobility  Bed Mobility Overal bed mobility: Needs Assistance Bed Mobility: Rolling;Sidelying to Sit;Sit to Supine Rolling: Max assist;+2 for physical assistance;+2 for safety/equipment Sidelying to sit: Max assist;+2 for physical assistance;+2 for safety/equipment   Sit to supine: Max assist;+2 for physical assistance;+2 for safety/equipment   General bed mobility comments: Pt incontinent of bowel and assisted RN with rolling for hygiene and linen change and then assisted to bedside sitting    Transfers                 General transfer comment: Pt able to balance in sitting at EOB.  Sit to stand attempted with assist of 2 but min participation from pt and returned to sitting  Ambulation/Gait                Stairs            Wheelchair Mobility    Modified Rankin (Stroke Patients Only)       Balance Overall balance assessment: Needs assistance Sitting-balance support: No upper extremity supported;Feet supported Sitting balance-Leahy Scale: Fair                                        Pertinent Vitals/Pain Pain Assessment: Faces Faces Pain Scale: Hurts even more Pain Location: R hip Pain Descriptors / Indicators: Other (Comment);Moaning;Grimacing Pain Intervention(s): Limited activity within patient's tolerance;Monitored during session;Premedicated before session    [place] expects to be discharged to:: Skilled nursing facility                      Prior Function           Comments: Per chart pt was ambulatory at memory care     Hand Dominance        Extremity/Trunk Assessment   Upper Extremity Assessment Upper Extremity Assessment: Overall WFL for tasks assessed    Lower Extremity Assessment Lower Extremity Assessment: RLE deficits/detail;Difficult to assess due to impaired cognition    Cervical / Trunk Assessment Cervical / Trunk Assessment: Kyphotic  Communication   Communication: Prefers language other than English  Cognition Arousal/Alertness: Lethargic Behavior During Therapy: Anxious;Flat affect Overall Cognitive Status: History of cognitive impairments - at baseline                                 General Comments: From memory care      General Comments      Exercises  Assessment/Plan    PT Assessment Patient needs continued PT services  PT Problem List Decreased strength;Decreased range of motion;Decreased activity tolerance;Decreased balance;Decreased mobility;Decreased cognition;Decreased knowledge of use of DME;Pain       PT Treatment Interventions DME instruction;Gait training;Functional mobility training;Therapeutic activities;Therapeutic exercise;Balance training;Cognitive remediation;Patient/family education    PT Goals (Current goals can be found in the Care Plan section)  Acute Rehab PT Goals Patient Stated Goal: No goals stated PT Goal Formulation: Patient unable to participate in goal setting Time For Goal Achievement:  02/25/20 Potential to Achieve Goals: Fair    Frequency Min 3X/week   Barriers to discharge        Co-evaluation               AM-PAC PT "6 Clicks" Mobility  Outcome Measure Help needed turning from your back to your side while in a flat bed without using bedrails?: A Lot Help needed moving from lying on your back to sitting on the side of a flat bed without using bedrails?: A Lot Help needed moving to and from a bed to a chair (including a wheelchair)?: Total Help needed standing up from a chair using your arms (e.g., wheelchair or bedside chair)?: Total Help needed to walk in hospital room?: Total Help needed climbing 3-5 steps with a railing? : Total 6 Click Score: 8    End of Session Equipment Utilized During Treatment: Gait belt Activity Tolerance: Patient limited by lethargy;Patient limited by pain;Other (comment) (confusion) Patient left: in bed;with call bell/phone within reach;with chair alarm set;with nursing/sitter in room Nurse Communication: Mobility status PT Visit Diagnosis: Difficulty in walking, not elsewhere classified (R26.2);Pain;History of falling (Z91.81);Repeated falls (R29.6) Pain - Right/Left: Right Pain - part of body: Hip    Time: 1035-1055 PT Time Calculation (min) (ACUTE ONLY): 20 min   Charges:   PT Evaluation $PT Eval Moderate Complexity: 1 Mod          [place] Pager [phone] Office [phone]   Leoni Goodness 02/11/2020, 2:41 PM

## 2020-02-11 NOTE — Progress Notes (Addendum)
Transition of Care (TOC) -30 day Note  PASRR     Patient Details  Name: Alice Wong MBT:[id number] Date of Birth: [birth year]     MUST ID:    To Whom it May Concern:   Please be advised that the above patient will require a short-term nursing home stay, anticipated 30 days or less rehabilitation and strengthening. The plan is for return home.

## 2020-02-11 NOTE — TOC Progression Note (Addendum)
Transition of Care [facility]) - Progression Note    Patient Details  Name: Alice Wong MRN: [id number] Date of Birth: Dec 09, 1934  [facility] of [facility] CM/SW [place], LCSW Phone Number: 02/11/2020, 3:14 PM  Clinical Narrative:    CSW provided the only SNF bed offer [facility]. Daughter [name] is agreeable. CSW notified the facility, the SNF will accept the patient tomorrow.  Patient vaccinated.   Covid test ordered.    Expected Discharge Plan: [place] Barriers to Discharge: Continued Medical Work up  Expected Discharge Plan and Services Expected Discharge Plan: [place] In-house Referral: Clinical Social Work Discharge Planning Services: CM Consult Post Acute Care Choice: [place] arrangements for the past 2 months:  (Memory Care)                                       Social Determinants of Health (SDOH) Interventions    Readmission Risk Interventions No flowsheet data found.

## 2020-02-11 NOTE — Care Management Important Message (Signed)
Important Message  Patient Details Verbal consent give from Patient's daughter [name]. Name: CAMIKA MARSICO MRN: [id number] Date of Birth: Sep 10, 1934   Medicare Important Message Given:  Yes     Kerin Salen 02/11/2020, 2:27 PM

## 2020-02-12 DIAGNOSIS — S72001A Fracture of unspecified part of neck of right femur, initial encounter for closed fracture: Secondary | ICD-10-CM | POA: Diagnosis not present

## 2020-02-12 LAB — CBC
HCT: 23.2 % — ABNORMAL LOW (ref 36.0–46.0)
Hemoglobin: 7.8 g/dL — ABNORMAL LOW (ref 12.0–15.0)
MCH: 32.6 pg (ref 26.0–34.0)
MCHC: 33.6 g/dL (ref 30.0–36.0)
MCV: 97.1 fL (ref 80.0–100.0)
Platelets: 151 10*3/uL (ref 150–400)
RBC: 2.39 MIL/uL — ABNORMAL LOW (ref 3.87–5.11)
RDW: 12.7 % (ref 11.5–15.5)
WBC: 6.2 10*3/uL (ref 4.0–10.5)
nRBC: 0 % (ref 0.0–0.2)

## 2020-02-12 LAB — BASIC METABOLIC PANEL
Anion gap: 8 (ref 5–15)
BUN: 33 mg/dL — ABNORMAL HIGH (ref 8–23)
CO2: 24 mmol/L (ref 22–32)
Calcium: 8.2 mg/dL — ABNORMAL LOW (ref 8.9–10.3)
Chloride: 109 mmol/L (ref 98–111)
Creatinine, Ser: 0.87 mg/dL (ref 0.44–1.00)
GFR, Estimated: >60 mL/min (ref >60)
Glucose, Bld: 145 mg/dL — ABNORMAL HIGH (ref 70–99)
Potassium: 4 mmol/L (ref 3.5–5.1)
Sodium: 141 mmol/L (ref 135–145)

## 2020-02-12 LAB — SARS CORONAVIRUS 2 (TAT 6-24 HRS): SARS Coronavirus 2: NEGATIVE

## 2020-02-12 MED ORDER — FERROUS SULFATE 325 (65 FE) MG PO TABS: 325.0000 mg | ORAL_TABLET | Freq: Every day | ORAL | 3 refills | Status: DC

## 2020-02-12 MED ORDER — ENOXAPARIN SODIUM 40 MG/0.4ML ~~LOC~~ SOLN: 40.0000 mg | SUBCUTANEOUS | 0 refills | Status: DC

## 2020-02-12 MED ORDER — TRAMADOL HCL 50 MG PO TABS: 50.0000 mg | ORAL_TABLET | Freq: Two times a day (BID) | ORAL | 0 refills | Status: DC

## 2020-02-12 MED ORDER — ASPIRIN 81 MG PO TBEC: 81.0000 mg | DELAYED_RELEASE_TABLET | Freq: Every day | ORAL | 12 refills | Status: DC

## 2020-02-12 MED ORDER — HALOPERIDOL 1 MG PO TABS: 1.0000 mg | ORAL_TABLET | Freq: Three times a day (TID) | ORAL | 0 refills | Status: DC

## 2020-02-12 MED ORDER — OLANZAPINE 10 MG PO TABS: 10.0000 mg | ORAL_TABLET | Freq: Every day | ORAL | 0 refills | Status: DC

## 2020-02-12 MED ORDER — TRAZODONE HCL 100 MG PO TABS: 100.0000 mg | ORAL_TABLET | Freq: Every day | ORAL | 0 refills | Status: DC

## 2020-02-12 NOTE — Discharge Summary (Signed)
Physician Discharge Summary  Alice Wong VHQ:[id number] DOB: 13-Jun-1934 DOA: 02/09/2020  PCP: Tresa Garter, MD  Admit date: 02/09/2020 Discharge date: 02/12/2020  Admitted From: ALF Discharge disposition: SNF   Code Status: Full Code  Diet Recommendation: Cardiac diet  Discharge Diagnosis:   Principal Problem:   Hip fracture [facility])  Chief Complaint  Patient presents with  . Bleeding/Bruising   Brief narrative: Alice Wong is a 86 y.o. female with PMH significant for advanced dementia, HTN, HLD, CVA, depression/anxiety, history of falls who lives at [facility]. Patient was sent to the ED today for a bruise to the right hip. Per report, patient had a fall in 1/11. She was brought to ED. CT scan of head, neck and maxillofacial were unremarkable at that time.  Per report, patient was ambulatory at the time and hip x-ray was not done. She was discharged back to nursing facility. Per facility, there is no reported fall afterwords but today patient was noted to have bruise on her right hip. Patient herself is not able to elaborate any history because of dementia. Not on blood thinners.  In the ED, patient was afebrile, blood pressure was elevated to 181/84, breathing room air. Labs unremarkable except WBC count elevated to 14.7 and glucose elevated to 168 Covid PCR pending X-ray hip showed comminuted intertrochanteric right hip fracture with impaction and varus angulation. Chest x-ray did not show any acute airspace disease. CT head and CT cervical spine did not show any acute intracranial abnormality. But showed atrophy and mild chronic small vessel ischemic changes of white matter.  ED physician discussed with orthopedics Dr. Dion Saucier.   Patient was admitted to hospitalist service.  Seen by orthopedics Dr. Dion Saucier for consultation. 1/27, patient underwent intramedullary nailing of right intertrochanteric fracture.  Subjective: Patient was seen and examined this  morning.  Elderly female.  Sleeping.  Opens eyes on verbal command.  Not in distress.  Assessment/Plan: Comminuted intertrochanteric right hip fracture History of falls -Per SNF, no clear incident of fall but patient has history of falls. -Brought in for right hip bruising.  X-ray showed comminuted intertrochanteric right hip fracture -Seen by orthopedics Dr. Dion Saucier for consultation. -1/27, patient underwent intramedullary nailing of right intertrochanteric fracture. -Pain medicine and DVT prophylaxis per orthopedics team.  Advanced dementia with behavioral symptoms History of depression/anxiety -Home meds include Haldol 1 mg 3 times daily, Namenda 10 mg twice daily, Zyprexa 10 mg daily at bedtime, trazodone 1 mg at bedtime. -Continue all.  Cardiovascular issues: HTN, HLD, CVA -Home meds include amlodipine 10 mg daily, aspirin 81 mg daily. -Not on statin. -Continue amlodipine.  Keep aspirin on hold while patient is on full dose Lovenox.  Resume aspirin after that.  Stable for discharge to SNF today.  Wound care: Incision (Closed) 02/10/20 Hip Right (Active)  Date First Assessed/Time First Assessed: 02/10/20 1634   Location: Hip  Location Orientation: Right    Assessments 02/10/2020  5:08 PM 02/12/2020  8:30 AM  Dressing Type -- Silicone dressing  Dressing Intact;New drainage Clean;Dry;Intact;Old drainage (marked)  Site / Wound Assessment Dressing in place / Unable to assess Dressing in place / Unable to assess  Drainage Amount Scant Scant  Drainage Description Serosanguineous Sanguineous  Treatment Ice applied --     No Linked orders to display    Discharge Exam:   Vitals:   02/11/20 1220 02/11/20 1312 02/11/20 2118 02/12/20 0510  BP:  104/61 127/63 (!) 147/61  Pulse:  75 (!) 110 75  Resp:  17 14 16   Temp:  97.8 F (36.6 C) 99.5 F (37.5 C) 98.1 F (36.7 C)  TempSrc:   Oral Oral  SpO2:  (!) 89% 91% 95%  Weight: 65.8 kg     Height:        Body mass index is  24.89 kg/m.  General exam: Pleasant elderly female, sleeping, arousable.  Not in pain Skin: No rashes, lesions or ulcers. HEENT: Atraumatic, normocephalic, no obvious bleeding Lungs: Clear to auscultation bilaterally CVS: Regular rate and rhythm, no murmur GI/Abd soft, nontender, nondistended, bowel sound present CNS: Sleeping, arousable, demented at baseline. Psychiatry: Mood appropriate Extremities: No pedal edema, no calf tenderness  Follow ups:   Discharge Instructions    Diet general   Complete by: As directed    Discharge wound care:   Complete by: As directed    Reinforce dressing   Increase activity slowly   Complete by: As directed       Follow-up Information    Marchia Bond, MD. Schedule an appointment as soon as possible for a visit in 2 weeks.   Specialty: Orthopedic Surgery Contact information: [address] [place] [address] [phone]        Plotnikov, Evie Lacks, MD Follow up.   Specialty: Internal Medicine Contact information: [place] [address] [phone]               Recommendations for Outpatient Follow-Up:   1. Follow-up with PCP as an outpatient 2. Follow-up with orthopedics as an outpatient  Discharge Instructions:  Follow with Primary MD Plotnikov, Evie Lacks, MD in 7 days   Get CBC/BMP checked in next visit within 1 week by PCP or SNF MD ( we routinely change or add medications that can affect your baseline labs and fluid status, therefore we recommend that you get the mentioned basic workup next visit with your PCP, your PCP may decide not to get them or add new tests based on their clinical decision)  On your next visit with your PCP, please Get Medicines reviewed and adjusted.  Please request your PCP  to go over all Hospital Tests and Procedure/Radiological results at the follow up, please get all Hospital records sent to your Prim MD by signing hospital release before you go  home.  Activity: As tolerated with Full fall precautions use walker/cane & assistance as needed  For Heart failure patients - Check your Weight same time everyday, if you gain over 2 pounds, or you develop in leg swelling, experience more shortness of breath or chest pain, call your Primary MD immediately. Follow Cardiac Low Salt Diet and 1.5 lit/day fluid restriction.  If you have smoked or chewed Tobacco in the last 2 yrs please stop smoking, stop any regular Alcohol  and or any Recreational drug use.  If you experience worsening of your admission symptoms, develop shortness of breath, life threatening emergency, suicidal or homicidal thoughts you must seek medical attention immediately by calling 911 or calling your MD immediately  if symptoms less severe.  You Must read complete instructions/literature along with all the possible adverse reactions/side effects for all the Medicines you take and that have been prescribed to you. Take any new Medicines after you have completely understood and accpet all the possible adverse reactions/side effects.   Do not drive, operate heavy machinery, perform activities at heights, swimming or participation in water activities or provide baby sitting services if your were admitted for syncope or siezures  until you have seen by Primary MD or a Neurologist and advised to do so again.  Do not drive when taking Pain medications.  Do not take more than prescribed Pain, Sleep and Anxiety Medications  Wear Seat belts while driving.   Please note You were cared for by a hospitalist during your hospital stay. If you have any questions about your discharge medications or the care you received while you were in the hospital after you are discharged, you can call the unit and asked to speak with the hospitalist on call if the hospitalist that took care of you is not available. Once you are discharged, your primary care physician will handle any further medical issues.  Please note that NO REFILLS for any discharge medications will be authorized once you are discharged, as it is imperative that you return to your primary care physician (or establish a relationship with a primary care physician if you do not have one) for your aftercare needs so that they can reassess your need for medications and monitor your lab values.    Allergies as of 02/12/2020      Reactions   Aricept [donepezil Hcl]    ?dry tongue   Coreg [carvedilol]    ?problems   Morphine Sulfate Other (See Comments)   REACTION: decreased blood pressure      Medication List    STOP taking these medications   alum & mag hydroxide-simeth 200-200-20 MG/5ML suspension Commonly known as: MAALOX/MYLANTA   DULoxetine 60 MG capsule Commonly known as: Cymbalta   guaifenesin 100 MG/5ML syrup Commonly known as: ROBITUSSIN   LORazepam 2 MG tablet Commonly known as: ATIVAN   metFORMIN 500 MG tablet Commonly known as: GLUCOPHAGE     TAKE these medications   acetaminophen 500 MG tablet Commonly known as: TYLENOL Take 500 mg by mouth every 4 (four) hours as needed for mild pain or fever.   amLODipine 5 MG tablet Commonly known as: NORVASC TAKE 1 TABLET BY MOUTH EVERY DAY What changed: Another medication with the same name was removed. Continue taking this medication, and follow the directions you see here.   aspirin 81 MG EC tablet Take 1 tablet (81 mg total) by mouth daily. Start taking on: March 15, 2020 What changed: These instructions start on March 15, 2020. If you are unsure what to do until then, ask your doctor or other care provider.   Cholecalciferol 1.25 MG (50000 UT) capsule Commonly known as: D3-50 Take 1 capsule (50,000 Units total) by mouth every 30 (thirty) days.   enoxaparin 40 MG/0.4ML injection Commonly known as: LOVENOX Inject 0.4 mLs (40 mg total) into the skin daily. Start taking on: February 13, 2020   ferrous sulfate 325 (65 FE) MG tablet Take 1 tablet (325 mg  total) by mouth daily with breakfast.   haloperidol 1 MG tablet Commonly known as: HALDOL Take 1 tablet (1 mg total) by mouth 3 (three) times daily for 3 days.   loperamide 2 MG capsule Commonly known as: IMODIUM Take 2 mg by mouth daily as needed for diarrhea or loose stools. Do not exceed 8 doses in 24 hours   magnesium hydroxide 400 MG/5ML suspension Commonly known as: MILK OF MAGNESIA Take 30 mLs by mouth at bedtime as needed for mild constipation.   memantine 10 MG tablet Commonly known as: NAMENDA TAKE 1 TABLET BY MOUTH TWICE A DAY   neomycin-bacitracin-polymyxin [phone] ointment Apply 1 application topically daily as needed (skin tear).   nitroGLYCERIN 0.4 MG  SL tablet Commonly known as: NITROSTAT DISSOLVE ONE TABLET UNDER THE TONGUE EVERY 5 MINUTES AS NEEDED FOR CHEST PAIN What changed: See the new instructions.   OLANZapine 10 MG tablet Commonly known as: ZYPREXA Take 1 tablet (10 mg total) by mouth at bedtime for 5 days.   traMADol 50 MG tablet Commonly known as: ULTRAM Take 1 tablet (50 mg total) by mouth 2 (two) times daily.   traZODone 100 MG tablet Commonly known as: DESYREL Take 1 tablet (100 mg total) by mouth at bedtime for 5 days.            Discharge Care Instructions  (From admission, onward)         Start     Ordered   02/12/20 0000  Discharge wound care:       Comments: Reinforce dressing   02/12/20 1313          Time coordinating discharge: 35 minutes  The results of significant diagnostics from this hospitalization (including imaging, microbiology, ancillary and laboratory) are listed below for reference.    Procedures and Diagnostic Studies:   DG Chest 1 View  Result Date: 02/09/2020 CLINICAL DATA:  Fall EXAM: CHEST  1 VIEW COMPARISON:  12/04/2017, 02/21/2017 FINDINGS: No acute airspace disease or effusion. Stable cardiomediastinal silhouette with borderline cardiomegaly. Suspected skin fold artifact over the left chest.  Asymmetrical opacity at the left lung apex. Ovoid nodularity in the right mid lung. IMPRESSION: 1. No acute airspace disease. 2. Asymmetrical opacity at the left lung apex, probably due to first rib end and bony summation though appears different compared to prior. Additional small nodular opacity in the right mid lung. Recommend chest CT for further evaluation. Electronically Signed   By: Donavan Foil M.D.   On: 02/09/2020 16:44   CT Head Wo Contrast  Result Date: 02/09/2020 CLINICAL DATA:  Facial trauma fall hip fracture EXAM: CT HEAD WITHOUT CONTRAST CT CERVICAL SPINE WITHOUT CONTRAST TECHNIQUE: Multidetector CT imaging of the head and cervical spine was performed following the standard protocol without intravenous contrast. Multiplanar CT image reconstructions of the cervical spine were also generated. COMPARISON:  CT brain and cervical spine 01/25/2020 FINDINGS: CT HEAD FINDINGS Brain: No acute territorial infarction, hemorrhage or intracranial mass. Moderate atrophy. Mild hypodensity in the white matter consistent with chronic small vessel ischemic change. Stable ventricle size Vascular: No hyperdense vessels. Vertebral and carotid vascular calcification Skull: Normal. Negative for fracture or focal lesion. Sinuses/Orbits: Hyperdense secretions in the left maxillary sinus. Mucosal thickening in the sinuses Other: Small hyperdense mass in the subcutaneous soft tissues of the right cheek consistent with hematoma, decreased compared to prior CT CERVICAL SPINE FINDINGS Alignment: Trace retrolisthesis C3 on C4, stable. Facet alignment is maintained. Skull base and vertebrae: No acute fracture. No primary bone lesion or focal pathologic process. Soft tissues and spinal canal: No prevertebral fluid or swelling. No visible canal hematoma. Disc levels: Multiple level degenerative change, moderate disc space narrowing at C3-C4 and C6-C7 with moderate advanced disease at C5-C6. Facet degenerative changes at multiple  levels. Upper chest: Negative. Other: None IMPRESSION: 1. No CT evidence for acute intracranial abnormality. Atrophy and mild chronic small vessel ischemic change of the white matter. 2. Degenerative changes of the cervical spine. No acute osseous abnormality. Electronically Signed   By: Donavan Foil M.D.   On: 02/09/2020 16:54   CT Cervical Spine Wo Contrast  Result Date: 02/09/2020 CLINICAL DATA:  Facial trauma fall hip fracture EXAM: CT HEAD WITHOUT CONTRAST CT CERVICAL  SPINE WITHOUT CONTRAST TECHNIQUE: Multidetector CT imaging of the head and cervical spine was performed following the standard protocol without intravenous contrast. Multiplanar CT image reconstructions of the cervical spine were also generated. COMPARISON:  CT brain and cervical spine 01/25/2020 FINDINGS: CT HEAD FINDINGS Brain: No acute territorial infarction, hemorrhage or intracranial mass. Moderate atrophy. Mild hypodensity in the white matter consistent with chronic small vessel ischemic change. Stable ventricle size Vascular: No hyperdense vessels. Vertebral and carotid vascular calcification Skull: Normal. Negative for fracture or focal lesion. Sinuses/Orbits: Hyperdense secretions in the left maxillary sinus. Mucosal thickening in the sinuses Other: Small hyperdense mass in the subcutaneous soft tissues of the right cheek consistent with hematoma, decreased compared to prior CT CERVICAL SPINE FINDINGS Alignment: Trace retrolisthesis C3 on C4, stable. Facet alignment is maintained. Skull base and vertebrae: No acute fracture. No primary bone lesion or focal pathologic process. Soft tissues and spinal canal: No prevertebral fluid or swelling. No visible canal hematoma. Disc levels: Multiple level degenerative change, moderate disc space narrowing at C3-C4 and C6-C7 with moderate advanced disease at C5-C6. Facet degenerative changes at multiple levels. Upper chest: Negative. Other: None IMPRESSION: 1. No CT evidence for acute  intracranial abnormality. Atrophy and mild chronic small vessel ischemic change of the white matter. 2. Degenerative changes of the cervical spine. No acute osseous abnormality. Electronically Signed   By: Donavan Foil M.D.   On: 02/09/2020 16:54   Pelvis Portable  Result Date: 02/10/2020 CLINICAL DATA:  Postop EXAM: PORTABLE PELVIS 1-2 VIEWS COMPARISON:  02/09/2020 FINDINGS: Interval intramedullary rod and distal screw fixation of right femur for intertrochanteric fracture. Gas in the soft tissues consistent with recent surgery. Possible remote fracture deformity of left superior pubic ramus though partially obscured by stool and bowel gas. IMPRESSION: Interval fixation of right intertrochanteric fracture. Electronically Signed   By: Donavan Foil M.D.   On: 02/10/2020 18:16   DG C-Arm 1-60 Min-No Report  Result Date: 02/10/2020 Fluoroscopy was utilized by the requesting physician.  No radiographic interpretation.   DG Hip Port Unilat With Pelvis 1V Right  Result Date: 02/10/2020 CLINICAL DATA:  Postop EXAM: DG HIP (WITH OR WITHOUT PELVIS) 1V PORT RIGHT COMPARISON:  02/09/2020 FINDINGS: Cross-table lateral view of the right hip shows internal fixation of right intertrochanteric fracture. IMPRESSION: Interval internal fixation of right intertrochanteric fracture. Electronically Signed   By: Donavan Foil M.D.   On: 02/10/2020 18:15   DG HIP OPERATIVE UNILAT W OR W/O PELVIS RIGHT  Result Date: 02/10/2020 CLINICAL DATA:  86 year old female with ORIF of the right femur. EXAM: OPERATIVE right HIP (WITH PELVIS IF PERFORMED) 2 VIEWS TECHNIQUE: Fluoroscopic spot image(s) were submitted for interpretation post-operatively. COMPARISON:  Right hand radiograph dated 02/09/2020. FINDINGS: Six intraoperative fluoroscopic images provided. The total fluoroscopic time is 42 seconds. There has been interval placement of intramedullary rod and cervical screw. IMPRESSION: Intraoperative fluoroscopic images of the  right hip. Electronically Signed   By: Anner Crete M.D.   On: 02/10/2020 17:20   DG Hip Unilat With Pelvis 2-3 Views Right  Result Date: 02/09/2020 CLINICAL DATA:  Right hip bruising, history of falls EXAM: DG HIP (WITH OR WITHOUT PELVIS) 2-3V RIGHT COMPARISON:  None. FINDINGS: Frontal view of the pelvis as well as frontal and cross-table lateral views of the right hip are obtained. There is a comminuted intertrochanteric right hip fracture with impaction and varus angulation at the fracture site. No dislocation. No additional bony abnormalities. Left hip is unremarkable. IMPRESSION: 1. Comminuted intertrochanteric  right hip fracture with impaction and varus angulation. Electronically Signed   By: Randa Ngo M.D.   On: 02/09/2020 15:18     Labs:   Basic Metabolic Panel: Recent Labs  Lab 02/09/20 1430 02/10/20 0328 02/11/20 0807 02/12/20 0326  NA 139 138 139 141  K 3.6 3.4* 3.8 4.0  CL 100 103 105 109  CO2 27 24 25 24   GLUCOSE 168* 156* 141* 145*  BUN 21 27* 31* 33*  CREATININE 0.75 0.95 0.82 0.87  CALCIUM 9.3 8.8* 8.2* 8.2*   GFR Estimated Creatinine Clearance: 45.7 mL/min (by C-G formula based on SCr of 0.87 mg/dL). Liver Function Tests: No results for input(s): AST, ALT, ALKPHOS, BILITOT, PROT, ALBUMIN in the last 168 hours. No results for input(s): LIPASE, AMYLASE in the last 168 hours. No results for input(s): AMMONIA in the last 168 hours. Coagulation profile Recent Labs  Lab 02/09/20 1430  INR 1.1    CBC: Recent Labs  Lab 02/09/20 1430 02/10/20 0328 02/11/20 0807 02/12/20 0326  WBC 14.7* 9.9 7.7 6.2  NEUTROABS 13.3*  --   --   --   HGB 12.5 10.4* 8.4* 7.8*  HCT 35.4* 30.2* 24.3* 23.2*  MCV 91.9 94.1 94.2 97.1  PLT 208 158 154 151   Cardiac Enzymes: No results for input(s): CKTOTAL, CKMB, CKMBINDEX, TROPONINI in the last 168 hours. BNP: Invalid input(s): POCBNP CBG: No results for input(s): GLUCAP in the last 168 hours. D-Dimer No results for  input(s): DDIMER in the last 72 hours. Hgb A1c No results for input(s): HGBA1C in the last 72 hours. Lipid Profile No results for input(s): CHOL, HDL, LDLCALC, TRIG, CHOLHDL, LDLDIRECT in the last 72 hours. Thyroid function studies No results for input(s): TSH, T4TOTAL, T3FREE, THYROIDAB in the last 72 hours.  Invalid input(s): FREET3 Anemia work up No results for input(s): VITAMINB12, FOLATE, FERRITIN, TIBC, IRON, RETICCTPCT in the last 72 hours. Microbiology Recent Results (from the past 240 hour(s))  SARS Coronavirus 2 by RT PCR (hospital order, performed in [facility] hospital lab) Nasopharyngeal Nasopharyngeal Swab     Status: None   Collection Time: 02/09/20  3:26 PM   Specimen: Nasopharyngeal Swab  Result Value Ref Range Status   SARS Coronavirus 2 NEGATIVE NEGATIVE Final    Comment: (NOTE) SARS-CoV-2 target nucleic acids are NOT DETECTED.  The SARS-CoV-2 RNA is generally detectable in upper and lower respiratory specimens during the acute phase of infection. The lowest concentration of SARS-CoV-2 viral copies this assay can detect is 250 copies / mL. A negative result does not preclude SARS-CoV-2 infection and should not be used as the sole basis for treatment or other patient management decisions.  A negative result may occur with improper specimen collection / handling, submission of specimen other than nasopharyngeal swab, presence of viral mutation(s) within the areas targeted by this assay, and inadequate number of viral copies (<250 copies / mL). A negative result must be combined with clinical observations, patient history, and epidemiological information.  Fact Sheet for Patients:   StrictlyIdeas.no  Fact Sheet for Healthcare Providers: BankingDealers.co.za  This test is not yet approved or  cleared by the [place] FDA and has been authorized for detection and/or diagnosis of SARS-CoV-2 by FDA under an  Emergency Use Authorization (EUA).  This EUA will remain in effect (meaning this test can be used) for the duration of the COVID-19 declaration under Section 564(b)(1) of the Act, 21 U.S.C. section 360bbb-3(b)(1), unless the authorization is terminated or revoked sooner.  Performed at [facility], [place] [address] [place], [place] [address]   Surgical pcr screen     Status: None   Collection Time: 02/10/20  5:07 AM   Specimen: Nasal Mucosa; Nasal Swab  Result Value Ref Range Status   MRSA, PCR NEGATIVE NEGATIVE Final   Staphylococcus aureus NEGATIVE NEGATIVE Final    Comment: (NOTE) The Xpert SA Assay (FDA approved for NASAL specimens in patients 45 years of age and older), is one component of a comprehensive surveillance program. It is not intended to diagnose infection nor to guide or monitor treatment. Performed at [facility], [place] [address] [place], [place] [address]   SARS CORONAVIRUS 2 (TAT 6-24 HRS) Nasopharyngeal Nasopharyngeal Swab     Status: None   Collection Time: 02/11/20  4:50 PM   Specimen: Nasopharyngeal Swab  Result Value Ref Range Status   SARS Coronavirus 2 NEGATIVE NEGATIVE Final    Comment: (NOTE) SARS-CoV-2 target nucleic acids are NOT DETECTED.  The SARS-CoV-2 RNA is generally detectable in upper and lower respiratory specimens during the acute phase of infection. Negative results do not preclude SARS-CoV-2 infection, do not rule out co-infections with other pathogens, and should not be used as the sole basis for treatment or other patient management decisions. Negative results must be combined with clinical observations, patient history, and epidemiological information. The expected result is Negative.  Fact Sheet for Patients: SugarRoll.be  Fact Sheet for Healthcare Providers: https://www.woods-mathews.com/  This test is not yet approved or cleared by the [place] FDA and  has been authorized for detection and/or diagnosis of SARS-CoV-2 by FDA under an Emergency Use Authorization (EUA). This EUA will remain  in effect (meaning this test can be used) for the duration of the COVID-19 declaration under Se ction 564(b)(1) of the Act, 21 U.S.C. section 360bbb-3(b)(1), unless the authorization is terminated or revoked sooner.  Performed at [facility], [place] [address], [place], [place] [address]      Signed: Terrilee Croak  [facility] 02/12/2020, 1:13 PM

## 2020-02-12 NOTE — Progress Notes (Signed)
Orthopaedic Progress Note  SUBJECTIVE: Patient has advanced dementia and is non-verbal thus not able to contribute any information about how she is feeling. She is resting comfortably in bed. Tolerating diet. Urinating. Scheduled for d/c later today to [facility].  OBJECTIVE: Vitals:   02/11/20 1220 02/11/20 1312 02/11/20 2118 02/12/20 0510  BP:  104/61 127/63 (!) 147/61  Pulse:  75 (!) 110 75  Resp:  17 14 16   Temp:  97.8 F (36.6 C) 99.5 F (37.5 C) 98.1 F (36.7 C)  TempSrc:   Oral Oral  SpO2:  (!) 89% 91% 95%  Weight: 65.8 kg     Height:       CBC Latest Ref Rng & Units 02/12/2020 02/11/2020 02/10/2020  WBC 4.0 - 10.5 K/uL 6.2 7.7 9.9  Hemoglobin 12.0 - 15.0 g/dL 7.8(L) 8.4(L) 10.4(L)  Hematocrit 36.0 - 46.0 % 23.2(L) 24.3(L) 30.2(L)  Platelets 150 - 400 K/uL 151 154 158   BMP Latest Ref Rng & Units 02/12/2020 02/11/2020 02/10/2020  Glucose 70 - 99 mg/dL 145(H) 141(H) 156(H)  BUN 8 - 23 mg/dL 33(H) 31(H) 27(H)  Creatinine 0.44 - 1.00 mg/dL 0.87 0.82 0.95  Sodium 135 - 145 mmol/L 141 139 138  Potassium 3.5 - 5.1 mmol/L 4.0 3.8 3.4(L)  Chloride 98 - 111 mmol/L 109 105 103  CO2 22 - 32 mmol/L 24 25 24   Calcium 8.9 - 10.3 mg/dL 8.2(L) 8.2(L) 8.8(L)   General: NAD. Laying in bed sleeping comfortably.  Resp: No increased WOB at rest ABD: Soft Skin: Ecchymosis and healing laceration on face RLE: Dressings C/D/I with minimal dried drainage. Ecchymosis lateral hip. No visible reaction or facial wincing with paplation or gentle passive motion of extremity. Unable to get accurate motor or sensory exam due to advanced dementia. +DP pulse   ASSESSMENT: 86 year old female with advanced dementia s/p fal 2 Days Post-Op  S/P Procedure(s) (LRB): INTRAMEDULLARY (IM) NAIL INTERTROCHANTRIC (Right) by Dr. Marchia Bond on 02/10/20   PLAN: Advance diet Up with therapy Incentive Spirometry Elevate and Apply ice Got a current weight on her and thus increased her Lovenox from    Weightbearing: WBAT RLE Insicional and dressing care: Reinforce dressings as needed Orthopedic device(s): None Showering: Keep dressing dry VTE prophylaxis: Lovenox 40mg  qd, SCDs, ambulation Pain control: Tylenol and Morphine PRN, Tramadol BID Follow - up plan: In office with Dr. Anice Paganini 2 weeks after discharge Contact information:  Katha Hamming MD, Patrecia Pace PA-C today (02/12/20) and tomorrow (02/13/20)  Dispo: Skilled Nursing Facility/Rehab. Have signed and placed d/c rx for pain medication and DVT prophylaxis in chart  POA is daughter [name] [phone]    Fonnie Crookshanks A. Carm[facility] [phone] (office) [email or web address]

## 2020-02-12 NOTE — TOC Transition Note (Addendum)
Transition of Care [facility]) - CM/SW Discharge Note   Patient Details  Name: ANZLEIGH SLAVEN MRN: [id number] Date of Birth: 13-Apr-1934  [facility] CM/SW Contact:  Trish Mage, LCSW Phone Number: 02/12/2020, 2:19 PM   Clinical Narrative:  Patient who is stable for discharge will transfer to [facility] today. Negative Covid test confirmed.  Signed 30 day note by MD appreciated, was sent to [facility] MUST.  Family informed of transfer.  PTAR arranged.  Nursing, please call report to [phone]. TOC sign off.    Final next level of care: Skilled Nursing Facility Barriers to Discharge: Barriers Resolved   Patient Goals and CMS Choice   CMS Medicare.gov Compare Post Acute Care list provided to:: Other (Comment Required) (Andreescu,Olga Daughter [phone]) Choice offered to / list presented to : Adult Children  Discharge Placement                       Discharge Plan and Services In-house Referral: Clinical Social Work Discharge Planning Services: CM Consult Post Acute Care Choice: [place]                               Social Determinants of Health (SDOH) Interventions     Readmission Risk Interventions No flowsheet data found.

## 2020-02-12 NOTE — Plan of Care (Signed)
Patient discharged to [facility] care in stable condition by [facility]

## 2020-02-14 NOTE — Anesthesia Postprocedure Evaluation (Signed)
Anesthesia Post Note  Patient: Alice Wong  Procedure(s) Performed: INTRAMEDULLARY (IM) NAIL INTERTROCHANTRIC (Right )     Patient location during evaluation: PACU Anesthesia Type: General Level of consciousness: awake and alert (Neuro at baseline) Pain management: pain level controlled Vital Signs Assessment: post-procedure vital signs reviewed and stable Respiratory status: spontaneous breathing, nonlabored ventilation, respiratory function stable and patient connected to nasal cannula oxygen Cardiovascular status: blood pressure returned to baseline and stable Postop Assessment: no apparent nausea or vomiting Anesthetic complications: no   No complications documented.  Last Vitals:  Vitals:   02/12/20 0510 02/12/20 1522  BP: (!) 147/61 135/64  Pulse: 75 84  Resp: 16 18  Temp: 36.7 C   SpO2: 95% 95%    Last Pain:  Vitals:   02/12/20 0510  TempSrc: Oral  PainSc:                  Catalina Gravel

## 2020-02-15 ENCOUNTER — Emergency Department (HOSPITAL_COMMUNITY): Payer: Medicare Other

## 2020-02-15 ENCOUNTER — Encounter (HOSPITAL_COMMUNITY): Payer: Self-pay

## 2020-02-15 ENCOUNTER — Other Ambulatory Visit: Payer: Self-pay

## 2020-02-15 ENCOUNTER — Emergency Department (HOSPITAL_COMMUNITY)
Admission: EM | Admit: 2020-02-15 | Discharge: 2020-02-15 | Disposition: A | Discharge status: Home / Self Care | Payer: Medicare Other | Attending: Emergency Medicine | Admitting: Emergency Medicine

## 2020-02-15 DIAGNOSIS — S0993XA Unspecified injury of face, initial encounter: Secondary | ICD-10-CM | POA: Diagnosis present

## 2020-02-15 DIAGNOSIS — E1129 Type 2 diabetes mellitus with other diabetic kidney complication: Secondary | ICD-10-CM | POA: Diagnosis not present

## 2020-02-15 DIAGNOSIS — Z7982 Long term (current) use of aspirin: Secondary | ICD-10-CM | POA: Diagnosis not present

## 2020-02-15 DIAGNOSIS — I10 Essential (primary) hypertension: Secondary | ICD-10-CM | POA: Insufficient documentation

## 2020-02-15 DIAGNOSIS — F0390 Unspecified dementia without behavioral disturbance: Secondary | ICD-10-CM | POA: Diagnosis not present

## 2020-02-15 DIAGNOSIS — Z79899 Other long term (current) drug therapy: Secondary | ICD-10-CM | POA: Diagnosis not present

## 2020-02-15 DIAGNOSIS — Z8603 Personal history of neoplasm of uncertain behavior: Secondary | ICD-10-CM | POA: Insufficient documentation

## 2020-02-15 DIAGNOSIS — Z20822 Contact with and (suspected) exposure to covid-19: Secondary | ICD-10-CM | POA: Diagnosis not present

## 2020-02-15 DIAGNOSIS — W19XXXA Unspecified fall, initial encounter: Secondary | ICD-10-CM

## 2020-02-15 DIAGNOSIS — S0083XA Contusion of other part of head, initial encounter: Secondary | ICD-10-CM | POA: Diagnosis not present

## 2020-02-15 DIAGNOSIS — W06XXXA Fall from bed, initial encounter: Secondary | ICD-10-CM | POA: Diagnosis not present

## 2020-02-15 LAB — SARS CORONAVIRUS 2 BY RT PCR (HOSPITAL ORDER, PERFORMED IN ~~LOC~~ HOSPITAL LAB): SARS Coronavirus 2: NEGATIVE

## 2020-02-15 NOTE — ED Notes (Signed)
[facility] called for transport.  

## 2020-02-15 NOTE — ED Provider Notes (Signed)
[facility] DEPT Provider Note   CSN: [id number] Arrival date & time: 02/15/20  1700     History Chief Complaint  Patient presents with  . Fall    Alice Wong is a 86 y.o. female.  Patient presents from [place] with a chief complaint of unwitnessed fall.  Alice Wong a representative from [employer] was contacted.  She reports that the patient is acting her normal self throughout the day and active.  She indicated that she was tired and was assisted to bed.  Shortly after placing the patient in bed all the staff walked by the patient room and noticed her laying on the floor.  She reported a hematoma to her forehead.  Reports that patient's baseline is alert to person due to her dementia.  Patient reports that she is currently on Lovenox and received her dose for today.    Per chart review patient was admitted to hospital 1/26-1/29 for right hip fracture.  Patient was discharged on Lovenox to start taking on 02/13/20.      HPI     Past Medical History:  Diagnosis Date  . Anxiety   . Cholelithiasis   . CVA (cerebral vascular accident) ([place])   . Dementia ([place])   . Depression   . Diverticulosis   . GERD (gastroesophageal reflux disease)   . Gout 2009  . H. pylori infection 2011  . Hyperlipidemia   . Hypertension   . Memory loss   . MR (mitral regurgitation)   . Osteoporosis   . S/P cardiac cath 12/20/13 12/20/2013  . Thrombocytopenia ([place])   . Thyroid cyst   . Vaso-vagal reaction   . Vitamin D deficiency     Patient Active Problem List   Diagnosis Date Noted  . Hip fracture ([place]) 02/09/2020  . Agitation 12/09/2017  . Paranoia ([place])   . Incontinence of bowel 08/15/2017  . Chest wall contusion 02/21/2017  . Diarrhea 09/22/2015  . Redness of eye, right 02/15/2015  . Noncompliance with medication regimen 11/15/2014  . Dementia with behavioral disturbance ([place]) 07/28/2014  . Cerebral infarction ([place])  07/21/2014  . Sternum pain 04/21/2014  . Costochondritis 04/13/2014  . Simple chronic bronchitis ([place]) 04/13/2014  . Cough 03/22/2014  . S/P cardiac cath 12/20/13, non obsteructive disease 12/20/2013  . Abnormal nuclear stress test, false positive per cath   . Vasovagal syncope 12/17/2013  . Midsternal chest pain 12/17/2013  . Neoplasm of uncertain behavior of skin 10/27/2013  . Uncontrolled type 2 diabetes with renal manifestation ([place]) 11/04/2012  . Pain of right thumb 12/20/2011  . Psychosomatic disorder 08/23/2011  . Tinnitus 06/04/2011  . Headache, post-traumatic, chronic 05/01/2011  . LBP (low back pain) 01/30/2011  . Insomnia 11/19/2010  . Actinic keratoses 05/23/2010  . Dizziness 04/09/2010  . Bruising 04/09/2010  . Thrombocytopenia ([place]) 01/02/2010  . HEMORRHOIDS 12/12/2009  . HELICOBACTER PYLORI INFECTION 03/17/2009  . PARESTHESIA 03/17/2009  . Chest pain, unspecified 08/01/2008  . Vitamin D deficiency 03/03/2008  . GAIT IMBALANCE 10/22/2007  . Gout, unspecified 05/07/2007  . FOOT PAIN 05/07/2007  . Dyslipidemia 02/05/2007  . Anxiety state 02/05/2007  . Depression with anxiety 02/05/2007  . Essential hypertension 02/05/2007  . GERD 02/05/2007  . OSTEOPOROSIS 02/05/2007  . Memory loss 02/05/2007  . History of cardiovascular disorder 02/05/2007    Past Surgical History:  Procedure Laterality Date  . CESAREAN SECTION    . CHOLECYSTECTOMY  2010  . INTRAMEDULLARY (IM) NAIL INTERTROCHANTERIC Right 02/10/2020  Procedure: INTRAMEDULLARY (IM) NAIL INTERTROCHANTRIC;  Surgeon: Marchia Bond, MD;  Location: [facility] ORS;  Service: Orthopedics;  Laterality: Right;  . LASIK    . LEFT HEART CATHETERIZATION WITH CORONARY ANGIOGRAM N/A 12/20/2013   Procedure: LEFT HEART CATHETERIZATION WITH CORONARY ANGIOGRAM;  Surgeon: Burnell Blanks, MD;  Location: [facility] CATH LAB;  Service: Cardiovascular;  Laterality: N/A;     OB History   No obstetric history on file.     Family  History  Problem Relation Age of Onset  . Lung disease Father        Deceased  . Hypertension Other   . Stomach cancer Brother   . Ulcers Son   . Colon cancer Neg Hx     Social History   Tobacco Use  . Smoking status: Never Smoker  . Smokeless tobacco: Never Used  Vaping Use  . Vaping Use: Never used  Substance Use Topics  . Alcohol use: No    Alcohol/week: 0.0 standard drinks  . Drug use: No    Types: Methylphenidate    Home Medications Prior to Admission medications   Medication Sig Start Date End Date Taking? Authorizing Provider  acetaminophen (TYLENOL) 500 MG tablet Take 500 mg by mouth every 4 (four) hours as needed for mild pain or fever.    [provider]  amLODipine (NORVASC) 5 MG tablet TAKE 1 TABLET BY MOUTH EVERY DAY Patient not taking: Reported on 02/09/2020 09/16/17   Plotnikov, Evie Lacks, MD  aspirin 81 MG EC tablet Take 1 tablet (81 mg total) by mouth daily. 03/15/20   Terrilee Croak, MD  Cholecalciferol (D3-50) 1.25 MG (50000 UT) capsule Take 1 capsule (50,000 Units total) by mouth every 30 (thirty) days. 11/25/17   Plotnikov, Evie Lacks, MD  enoxaparin (LOVENOX) 40 MG/0.4ML injection Inject 0.4 mLs (40 mg total) into the skin daily. 02/13/20 03/14/20  Delray Alt, PA-C  ferrous sulfate 325 (65 FE) MG tablet Take 1 tablet (325 mg total) by mouth daily with breakfast. 02/12/20   Dahal, Marlowe Aschoff, MD  haloperidol (HALDOL) 1 MG tablet Take 1 tablet (1 mg total) by mouth 3 (three) times daily for 3 days. 02/12/20 02/15/20  Terrilee Croak, MD  loperamide (IMODIUM) 2 MG capsule Take 2 mg by mouth daily as needed for diarrhea or loose stools. Do not exceed 8 doses in 24 hours    [provider]  magnesium hydroxide (MILK OF MAGNESIA) 400 MG/5ML suspension Take 30 mLs by mouth at bedtime as needed for mild constipation.    [provider]  memantine (NAMENDA) 10 MG tablet TAKE 1 TABLET BY MOUTH TWICE A DAY Patient taking differently: Take 10 mg by mouth 2  (two) times daily. 10/03/17   Plotnikov, Evie Lacks, MD  neomycin-bacitracin-polymyxin (NEOSPORIN) [phone] ointment Apply 1 application topically daily as needed (skin tear).    [provider]  nitroGLYCERIN (NITROSTAT) 0.4 MG SL tablet DISSOLVE ONE TABLET UNDER THE TONGUE EVERY 5 MINUTES AS NEEDED FOR CHEST PAIN Patient taking differently: Place 0.4 mg under the tongue every 5 (five) minutes as needed for chest pain. 08/23/16   Plotnikov, Evie Lacks, MD  OLANZapine (ZYPREXA) 10 MG tablet Take 1 tablet (10 mg total) by mouth at bedtime for 5 days. 02/12/20 02/17/20  Terrilee Croak, MD  traMADol (ULTRAM) 50 MG tablet Take 1 tablet (50 mg total) by mouth 2 (two) times daily. 02/12/20   Delray Alt, PA-C  traZODone (DESYREL) 100 MG tablet Take 1 tablet (100 mg total) by mouth  at bedtime for 5 days. 02/12/20 02/17/20  Terrilee Croak, MD    Allergies    Aricept Reather Littler hcl], Coreg [carvedilol], and Morphine sulfate  Review of Systems   Review of Systems  Unable to perform ROS: Dementia    Physical Exam Updated Vital Signs BP (!) 166/70   Pulse 88   Temp 97.9 F (36.6 C)   Resp 16   SpO2 97%   Physical Exam Vitals and nursing note reviewed.  Constitutional:      General: She is not in acute distress.    Appearance: She is not ill-appearing, toxic-appearing or diaphoretic.  HENT:     Head: Normocephalic. Contusion present. No raccoon eyes, Battle's sign, right periorbital erythema or left periorbital erythema.     Jaw: No tenderness.      Comments: Hematoma noted to right forehead  Bruising noted to right cheekbone, no tenderness to facial bones Eyes:     General: Vision grossly intact.     Extraocular Movements: Extraocular movements intact.     Conjunctiva/sclera:     Right eye: No hemorrhage.    Left eye: No hemorrhage.    Pupils: Pupils are equal, round, and reactive to light.  Cardiovascular:     Rate and Rhythm: Normal rate.  Pulmonary:     Effort: Pulmonary  effort is normal.     Breath sounds: Normal breath sounds.  Abdominal:     General: There is no distension.     Palpations: Abdomen is soft.     Tenderness: There is no abdominal tenderness.  Musculoskeletal:     Right shoulder: No swelling, deformity, laceration, tenderness or bony tenderness. Normal range of motion.     Left shoulder: No swelling, deformity, laceration, tenderness or bony tenderness. Normal range of motion.     Right upper arm: Normal.     Left upper arm: Normal.     Right elbow: Normal.     Left elbow: Normal.     Right forearm: Normal.     Left forearm: Normal.     Right wrist: No swelling, deformity, effusion, tenderness or bony tenderness. Normal range of motion.     Left wrist: No swelling, deformity, effusion, tenderness or bony tenderness. Normal range of motion.     Right hand: No swelling, deformity, tenderness or bony tenderness. Normal range of motion.     Left hand: No swelling, deformity, tenderness or bony tenderness. Normal range of motion.     Cervical back: Normal range of motion and neck supple. No deformity, rigidity, torticollis, tenderness, bony tenderness or crepitus. Muscular tenderness present. No spinous process tenderness.     Thoracic back: No deformity, tenderness or bony tenderness.     Lumbar back: No deformity, tenderness or bony tenderness.     Right hip: Tenderness present. No deformity or bony tenderness.     Left hip: No deformity, tenderness or bony tenderness.     Right upper leg: Normal.     Left upper leg: Normal.     Right knee: No swelling, deformity, lacerations or bony tenderness. Normal range of motion. No tenderness.     Left knee: No swelling, deformity, lacerations or bony tenderness. Normal range of motion. No tenderness.     Right lower leg: Normal.     Left lower leg: Normal.     Comments: surgical incisions to lateral right thigh, incisions are intact, dry with minimal amount of dried blood surrounding them  Facial  grimacing noted on passive range of motion  of right lower extremity  Skin:    General: Skin is warm.     Findings: No bruising.  Neurological:     General: No focal deficit present.     Mental Status: She is alert.     GCS: GCS eye subscore is 4. GCS verbal subscore is 5. GCS motor subscore is 6.  Psychiatric:        Behavior: Behavior is cooperative.     ED Results / Procedures / Treatments   Labs (all labs ordered are listed, but only abnormal results are displayed) Labs Reviewed - No data to display  EKG None  Radiology DG Chest 1 View  Result Date: 02/15/2020 CLINICAL DATA:  86 year old female with fall. EXAM: CHEST  1 VIEW COMPARISON:  Chest radiograph dated 02/09/2020. FINDINGS: Faint reticulonodular densities in the left upper lobe have slightly progressed compared to prior radiograph and may represent atypical infection. Clinical correlation is recommended. Trace left pleural effusion suspected. No pneumothorax. Stable cardiac silhouette. Atherosclerotic calcification of the aorta. No acute osseous pathology. IMPRESSION: Slight progression of the left upper lobe reticulonodular densities compared to prior radiograph and may represent atypical infection. Clinical correlation and follow-up recommended. Electronically Signed   By: Anner Crete M.D.   On: 02/15/2020 18:10   CT Head Wo Contrast  Result Date: 02/15/2020 CLINICAL DATA:  Fall from bed, facial trauma EXAM: CT HEAD WITHOUT CONTRAST TECHNIQUE: Contiguous axial images were obtained from the base of the skull through the vertex without intravenous contrast. COMPARISON:  02/09/2020 FINDINGS: Brain: There is no acute intracranial hemorrhage, mass effect, or edema. Gray-white differentiation is preserved. There is no extra-axial fluid collection. Prominence of the ventricles and sulci reflects stable parenchymal volume loss. Patchy and confluent hypoattenuation in the supratentorial white matter likely reflects stable chronic  microvascular ischemic changes. Vascular: There is atherosclerotic calcification at the skull base. Skull: Calvarium is unremarkable. Sinuses/Orbits: Left maxillary sinus dominant mucosal thickening. No significant orbital finding. Other: Right frontal scalp soft tissue swelling. Soft tissue nodule within the subcutaneous tissues superficial to the right maxillary sinus at the site of prior hematoma. Presumably reflects remaining hematoma. IMPRESSION: No evidence of acute intracranial injury. Electronically Signed   By: Macy Mis M.D.   On: 02/15/2020 18:39   DG Hip Unilat W or Wo Pelvis 2-3 Views Right  Result Date: 02/15/2020 CLINICAL DATA:  86 year old female with fall. EXAM: DG HIP (WITH OR WITHOUT PELVIS) 2-3V RIGHT COMPARISON:  Chest radiograph dated 02/10/2020. FINDINGS: Right femoral fixation hardware appears intact. There is no acute fracture or dislocation. The bones are osteopenic. The soft tissues are unremarkable. IMPRESSION: No acute fracture or dislocation. Electronically Signed   By: Anner Crete M.D.   On: 02/15/2020 18:11    Procedures Procedures   Medications Ordered in ED Medications - No data to display  ED Course  I have reviewed the triage vital signs and the nursing notes.  Pertinent labs & imaging results that were available during my care of the patient were reviewed by me and considered in my medical decision making (see chart for details).    MDM Rules/Calculators/A&P                          Alert 86 year old female no acute distress, nontoxic appearing.  Patient has a history of dementia.  Presents from [facility] facility after sustaining a unwitnessed fall.  Representative from SNF reports that patient was acting her normal self  today, vital signs were stable, afebrile.  Patient received her Lovenox injection earlier today.    Patient noted to have hematoma to right forehead.  No facial grimacing to palpation of cervical,  thoracic and lumbar back.  No obvious deformity noted to extremities or chest.  No facial grimacing noted on palpation or passive range of motion to bilateral upper extremities and lower left extremity.  Facial grimacing noted with passive range of motion of right lower extremity.  No deformity or bony tenderness noted to pelvic girdle.    Contrast head and neck CT ordered and pending.  Due to patient's pain with passive range of motion to right lower extremity right hip x-ray obtained.  Chest x-ray pending.  Right hip x-ray showed no acute fracture or dislocation; right femoral neck fixation hardware appears intact.  CT head showed no evidence of acute intracranial injury.  No acute cervical spine fracture.    Chest x-ray shows slight progression of the left upper lobe reticulonoidular disease compared to prior radiograph and may result represent atypical infection.  Due to patient's recent time in the hospital and skilled nursing facility potential for COVID-19 infection; will swab patient.  Will need to self isolate until test results are obtained.    Patient was discussed with and evaluated by Dr. Darl Householder.  Alice Wong was evaluated in Emergency Department on 02/15/2020 for the symptoms described in the history of present illness. She was evaluated in the context of the global COVID-19 pandemic, which necessitated consideration that the patient might be at risk for infection with the SARS-CoV-2 virus that causes COVID-19. Institutional protocols and algorithms that pertain to the evaluation of patients at risk for COVID-19 are in a state of rapid change based on information released by regulatory bodies including the CDC and federal and state organizations. These policies and algorithms were followed during the patient's care in the ED.   Final Clinical Impression(s) / ED Diagnoses Final diagnoses:  Fall, initial encounter  Contusion of face, initial encounter    Rx / DC Orders ED Discharge  Orders    None       Dyann Ruddle 02/15/20 2241    Drenda Freeze, MD 02/15/20 [phone]

## 2020-02-15 NOTE — ED Triage Notes (Signed)
EMS reports from [facility], unwitnessed fall out of bed, visible hematoma righ side forehead, No blood thinners. Pt speaks [place], and has advanced dementia, at baseline per staff.  BP 166/70 HR 88 RR 16 Sp02 97 (2 ltrs 24/7) CBG 217

## 2020-02-15 NOTE — Discharge Instructions (Addendum)
Alice Wong sent to the emergency department after suffering an unwitnessed fall.  X-ray of right hip was unremarkable.  Head and neck CT were unremarkable.  Chest x-ray obtained showed possible atypical infection.  Patient was tested for COVID-19.  Patient COVID-19 test was negative.  Please check oxygen saturation.    Have patient follow-up with her primary care provider as needed.

## 2020-02-15 NOTE — ED Notes (Signed)
Patient attempting to lean off the side rail of bed. After being redirected and positioned into the middle of the bed, patient returns to leaning off the side. Pads placed on bed rails.

## 2020-02-15 NOTE — ED Notes (Signed)
[facility] called and aware of patients return to facility.

## 2020-02-29 ENCOUNTER — Encounter: Payer: Self-pay | Admitting: Gastroenterology

## 2020-03-01 ENCOUNTER — Emergency Department (HOSPITAL_COMMUNITY): Payer: Medicare Other

## 2020-03-01 ENCOUNTER — Inpatient Hospital Stay (HOSPITAL_COMMUNITY)
Admission: EM | Admit: 2020-03-01 | Discharge: 2020-03-08 | DRG: 071 | Disposition: A | Discharge status: Nursing Home (Includes ICF) | Payer: Medicare Other | Source: Skilled Nursing Facility | Attending: Family Medicine | Admitting: Family Medicine

## 2020-03-01 ENCOUNTER — Encounter (HOSPITAL_COMMUNITY): Payer: Self-pay | Admitting: Emergency Medicine

## 2020-03-01 ENCOUNTER — Other Ambulatory Visit: Payer: Self-pay

## 2020-03-01 DIAGNOSIS — A419 Sepsis, unspecified organism: Secondary | ICD-10-CM | POA: Diagnosis present

## 2020-03-01 DIAGNOSIS — R1312 Dysphagia, oropharyngeal phase: Secondary | ICD-10-CM | POA: Diagnosis present

## 2020-03-01 DIAGNOSIS — K219 Gastro-esophageal reflux disease without esophagitis: Secondary | ICD-10-CM | POA: Diagnosis present

## 2020-03-01 DIAGNOSIS — Z9889 Other specified postprocedural states: Secondary | ICD-10-CM | POA: Diagnosis not present

## 2020-03-01 DIAGNOSIS — F0390 Unspecified dementia without behavioral disturbance: Secondary | ICD-10-CM | POA: Diagnosis present

## 2020-03-01 DIAGNOSIS — M109 Gout, unspecified: Secondary | ICD-10-CM | POA: Diagnosis present

## 2020-03-01 DIAGNOSIS — E559 Vitamin D deficiency, unspecified: Secondary | ICD-10-CM | POA: Diagnosis present

## 2020-03-01 DIAGNOSIS — N179 Acute kidney failure, unspecified: Secondary | ICD-10-CM | POA: Diagnosis present

## 2020-03-01 DIAGNOSIS — R54 Age-related physical debility: Secondary | ICD-10-CM | POA: Diagnosis present

## 2020-03-01 DIAGNOSIS — I10 Essential (primary) hypertension: Secondary | ICD-10-CM | POA: Diagnosis present

## 2020-03-01 DIAGNOSIS — G4700 Insomnia, unspecified: Secondary | ICD-10-CM | POA: Diagnosis present

## 2020-03-01 DIAGNOSIS — Z79899 Other long term (current) drug therapy: Secondary | ICD-10-CM

## 2020-03-01 DIAGNOSIS — E785 Hyperlipidemia, unspecified: Secondary | ICD-10-CM | POA: Diagnosis present

## 2020-03-01 DIAGNOSIS — Z7189 Other specified counseling: Secondary | ICD-10-CM | POA: Diagnosis not present

## 2020-03-01 DIAGNOSIS — S72001D Fracture of unspecified part of neck of right femur, subsequent encounter for closed fracture with routine healing: Secondary | ICD-10-CM

## 2020-03-01 DIAGNOSIS — J320 Chronic maxillary sinusitis: Secondary | ICD-10-CM | POA: Diagnosis present

## 2020-03-01 DIAGNOSIS — N390 Urinary tract infection, site not specified: Secondary | ICD-10-CM | POA: Diagnosis present

## 2020-03-01 DIAGNOSIS — R4182 Altered mental status, unspecified: Secondary | ICD-10-CM | POA: Diagnosis not present

## 2020-03-01 DIAGNOSIS — Z1611 Resistance to penicillins: Secondary | ICD-10-CM | POA: Diagnosis present

## 2020-03-01 DIAGNOSIS — Z515 Encounter for palliative care: Secondary | ICD-10-CM | POA: Diagnosis not present

## 2020-03-01 DIAGNOSIS — E871 Hypo-osmolality and hyponatremia: Secondary | ICD-10-CM | POA: Diagnosis present

## 2020-03-01 DIAGNOSIS — R401 Stupor: Secondary | ICD-10-CM

## 2020-03-01 DIAGNOSIS — F419 Anxiety disorder, unspecified: Secondary | ICD-10-CM | POA: Diagnosis present

## 2020-03-01 DIAGNOSIS — M810 Age-related osteoporosis without current pathological fracture: Secondary | ICD-10-CM | POA: Diagnosis present

## 2020-03-01 DIAGNOSIS — Z885 Allergy status to narcotic agent status: Secondary | ICD-10-CM

## 2020-03-01 DIAGNOSIS — F32A Depression, unspecified: Secondary | ICD-10-CM | POA: Diagnosis present

## 2020-03-01 DIAGNOSIS — Z8249 Family history of ischemic heart disease and other diseases of the circulatory system: Secondary | ICD-10-CM

## 2020-03-01 DIAGNOSIS — Z79891 Long term (current) use of opiate analgesic: Secondary | ICD-10-CM

## 2020-03-01 DIAGNOSIS — Z9049 Acquired absence of other specified parts of digestive tract: Secondary | ICD-10-CM

## 2020-03-01 DIAGNOSIS — Z8673 Personal history of transient ischemic attack (TIA), and cerebral infarction without residual deficits: Secondary | ICD-10-CM

## 2020-03-01 DIAGNOSIS — E860 Dehydration: Secondary | ICD-10-CM | POA: Diagnosis present

## 2020-03-01 DIAGNOSIS — G9341 Metabolic encephalopathy: Secondary | ICD-10-CM | POA: Diagnosis not present

## 2020-03-01 DIAGNOSIS — E869 Volume depletion, unspecified: Secondary | ICD-10-CM | POA: Diagnosis not present

## 2020-03-01 DIAGNOSIS — Z66 Do not resuscitate: Secondary | ICD-10-CM | POA: Diagnosis not present

## 2020-03-01 DIAGNOSIS — R5381 Other malaise: Secondary | ICD-10-CM | POA: Diagnosis present

## 2020-03-01 DIAGNOSIS — Z888 Allergy status to other drugs, medicaments and biological substances status: Secondary | ICD-10-CM

## 2020-03-01 DIAGNOSIS — Z20822 Contact with and (suspected) exposure to covid-19: Secondary | ICD-10-CM | POA: Diagnosis present

## 2020-03-01 DIAGNOSIS — R6889 Other general symptoms and signs: Secondary | ICD-10-CM | POA: Diagnosis not present

## 2020-03-01 DIAGNOSIS — Z7982 Long term (current) use of aspirin: Secondary | ICD-10-CM

## 2020-03-01 DIAGNOSIS — B9620 Unspecified Escherichia coli [E. coli] as the cause of diseases classified elsewhere: Secondary | ICD-10-CM | POA: Diagnosis present

## 2020-03-01 DIAGNOSIS — E870 Hyperosmolality and hypernatremia: Secondary | ICD-10-CM | POA: Diagnosis present

## 2020-03-01 DIAGNOSIS — R410 Disorientation, unspecified: Secondary | ICD-10-CM | POA: Diagnosis not present

## 2020-03-01 LAB — COMPREHENSIVE METABOLIC PANEL
ALT: 21 U/L (ref 0–44)
AST: 18 U/L (ref 15–41)
Albumin: 3.8 g/dL (ref 3.5–5.0)
Alkaline Phosphatase: 116 U/L (ref 38–126)
Anion gap: 14 (ref 5–15)
BUN: 74 mg/dL — ABNORMAL HIGH (ref 8–23)
CO2: 22 mmol/L (ref 22–32)
Calcium: 9.5 mg/dL (ref 8.9–10.3)
Chloride: 122 mmol/L — ABNORMAL HIGH (ref 98–111)
Creatinine, Ser: 1.81 mg/dL — ABNORMAL HIGH (ref 0.44–1.00)
GFR, Estimated: 27 mL/min — ABNORMAL LOW (ref >60)
Glucose, Bld: 210 mg/dL — ABNORMAL HIGH (ref 70–99)
Potassium: 3.5 mmol/L (ref 3.5–5.1)
Sodium: 158 mmol/L — ABNORMAL HIGH (ref 135–145)
Total Bilirubin: 1.3 mg/dL — ABNORMAL HIGH (ref 0.3–1.2)
Total Protein: 7.2 g/dL (ref 6.5–8.1)

## 2020-03-01 LAB — URINALYSIS, ROUTINE W REFLEX MICROSCOPIC
Bilirubin Urine: NEGATIVE
Glucose, UA: NEGATIVE mg/dL
Ketones, ur: NEGATIVE mg/dL
Nitrite: NEGATIVE
Protein, ur: NEGATIVE mg/dL
Specific Gravity, Urine: 1.025 (ref 1.005–1.030)
pH: 5 (ref 5.0–8.0)

## 2020-03-01 LAB — CBC WITH DIFFERENTIAL/PLATELET
Abs Immature Granulocytes: 0.02 10*3/uL (ref 0.00–0.07)
Basophils Absolute: 0 10*3/uL (ref 0.0–0.1)
Basophils Relative: 0 %
Eosinophils Absolute: 0 10*3/uL (ref 0.0–0.5)
Eosinophils Relative: 0 %
HCT: 42.9 % (ref 36.0–46.0)
Hemoglobin: 14 g/dL (ref 12.0–15.0)
Immature Granulocytes: 0 %
Lymphocytes Relative: 8 %
Lymphs Abs: 0.6 10*3/uL — ABNORMAL LOW (ref 0.7–4.0)
MCH: 32.6 pg (ref 26.0–34.0)
MCHC: 32.6 g/dL (ref 30.0–36.0)
MCV: 100 fL (ref 80.0–100.0)
Monocytes Absolute: 0.3 10*3/uL (ref 0.1–1.0)
Monocytes Relative: 5 %
Neutro Abs: 6.3 10*3/uL (ref 1.7–7.7)
Neutrophils Relative %: 87 %
Platelets: 433 10*3/uL — ABNORMAL HIGH (ref 150–400)
RBC: 4.29 MIL/uL (ref 3.87–5.11)
RDW: 14.9 % (ref 11.5–15.5)
WBC: 7.2 10*3/uL (ref 4.0–10.5)
nRBC: 0 % (ref 0.0–0.2)

## 2020-03-01 LAB — RESP PANEL BY RT-PCR (FLU A&B, COVID) ARPGX2
Influenza A by PCR: NEGATIVE
Influenza B by PCR: NEGATIVE
SARS Coronavirus 2 by RT PCR: NEGATIVE

## 2020-03-01 LAB — LACTIC ACID, PLASMA: Lactic Acid, Venous: 1.7 mmol/L (ref 0.5–1.9)

## 2020-03-01 MED ORDER — SODIUM CHLORIDE 0.9 % IV BOLUS
1000.0000 mL | Freq: Once | INTRAVENOUS | Status: AC
  Administered 2020-03-01: 1000 mL via INTRAVENOUS

## 2020-03-01 MED ORDER — SODIUM CHLORIDE 0.9 % IV SOLN
1.0000 g | INTRAVENOUS | Status: DC
  Administered 2020-03-01 – 2020-03-05 (×5): 1 g via INTRAVENOUS
  Filled 2020-03-01 (×2): qty 10
  Filled 2020-03-01 (×2): qty 1
  Filled 2020-03-01 (×3): qty 10

## 2020-03-01 MED ORDER — ONDANSETRON HCL 4 MG/2ML IJ SOLN: 4.0000 mg | Freq: Four times a day (QID) | INTRAMUSCULAR | Status: DC | PRN

## 2020-03-01 MED ORDER — POTASSIUM CL IN DEXTROSE 5% 20 MEQ/L IV SOLN
20.0000 meq | INTRAVENOUS | Status: DC
  Administered 2020-03-01 – 2020-03-03 (×4): 20 meq via INTRAVENOUS
  Filled 2020-03-01 (×6): qty 1000

## 2020-03-01 MED ORDER — SODIUM CHLORIDE 0.9 % IV BOLUS
1500.0000 mL | Freq: Once | INTRAVENOUS | Status: AC
  Administered 2020-03-01: 1500 mL via INTRAVENOUS

## 2020-03-01 MED ORDER — ONDANSETRON HCL 4 MG PO TABS: 4.0000 mg | ORAL_TABLET | Freq: Four times a day (QID) | ORAL | Status: DC | PRN

## 2020-03-01 MED ORDER — ENOXAPARIN SODIUM 30 MG/0.3ML ~~LOC~~ SOLN
30.0000 mg | SUBCUTANEOUS | Status: DC
  Administered 2020-03-02 – 2020-03-03 (×2): 30 mg via SUBCUTANEOUS
  Filled 2020-03-01 (×2): qty 0.3

## 2020-03-01 NOTE — ED Triage Notes (Signed)
Pt BIB [facility] from [facility] and rehab. Staff noticed pt was unresponsive for maybe x2 days, they were unsure exactly when she was last seen normal. Hx of dementia pt speaks russian.Temp at facility 101. BP 135/80 RR30 [id number]

## 2020-03-01 NOTE — ED Notes (Signed)
Pt transported to CT ?

## 2020-03-01 NOTE — H&P (Signed)
[facility] History & Physical  Alice Splinter MD  Referring Provider: Dr Adonis Huguenin I Mednick 03/01/2020  Chief Complaint: Confusion HPI: The patient is a 86 y.o. year-old w/ hx of HTN, HL, H [place], gout, depression, dementia/ memory loss, h/o CVA, anxiety who lives at [facility], brought to ED today from the SNF by EMS. Pt was poor responsive for period of time possibly the last 2 days. Speaks [place]. Per the ED notes dtr stated pt is nonverbal at baseline but more awake yesterday than now. She just underwent R hip surgery on 1/27 per ortho Dr Mardelle Matte. Temp at facility was 101 deg, BP 135/ 80, RR 30. In ED labs showed na 158, creat 1.81 (b/l creat 0.8 from Feb 12 2020). WBC 7K and Hb 14 (was 7.8 on 02/12/20, 18d ago). COVID and flu neg today, blood/ urine cx's sent. T 99.4, BP 140/80. CXR clear. CT head shows atrophy and L max sinusitis. Asked to see for admission.    Pt seen in room, daughter Alice Wong at bedside.  Pt not responding, eyes closed, does not follow commands or respond to voice. Per the DTR patient has had memory issues for 4-5 yrs and had to go into a facility in 2019.  She does interact w/ the dtr but does not recognize her I believe.  She was ambulatory prior to the fall that led to the recent R hip surgery, per the dtr.  Pt is DNR per the dtr, as prior.   ROS  denies CP  no joint pain   no HA  no blurry vision  no rash  no diarrhea  no nausea/ vomiting  no dysuria  no difficulty voiding  no change in urine color   Past Medical History  Past Medical History:  Diagnosis Date  . Anxiety   . Cholelithiasis   . CVA (cerebral vascular accident) ([place])   . Dementia ([place])   . Depression   . Diverticulosis   . GERD (gastroesophageal reflux disease)   . Gout 2009  . H. pylori infection 2011  . Hyperlipidemia   . Hypertension   . Memory loss   . MR (mitral regurgitation)   . Osteoporosis   . S/P cardiac cath 12/20/13 12/20/2013  . Thrombocytopenia  ([place])   . Thyroid cyst   . Vaso-vagal reaction   . Vitamin D deficiency    Past Surgical History  Past Surgical History:  Procedure Laterality Date  . CESAREAN SECTION    . CHOLECYSTECTOMY  2010  . INTRAMEDULLARY (IM) NAIL INTERTROCHANTERIC Right 02/10/2020   Procedure: INTRAMEDULLARY (IM) NAIL INTERTROCHANTRIC;  Surgeon: Marchia Bond, MD;  Location: [facility] ORS;  Service: Orthopedics;  Laterality: Right;  . LASIK    . LEFT HEART CATHETERIZATION WITH CORONARY ANGIOGRAM N/A 12/20/2013   Procedure: LEFT HEART CATHETERIZATION WITH CORONARY ANGIOGRAM;  Surgeon: Burnell Blanks, MD;  Location: [facility] CATH LAB;  Service: Cardiovascular;  Laterality: N/A;   Family History  Family History  Problem Relation Age of Onset  . Lung disease Father        Deceased  . Hypertension Other   . Stomach cancer Brother   . Ulcers Son   . Colon cancer Neg Hx    Social History  reports that she has never smoked. She has never used smokeless tobacco. She reports that she does not drink alcohol and does not use drugs. Allergies  Allergies  Allergen Reactions  . Aricept [Donepezil Hcl]     ?dry tongue  .  Coreg [Carvedilol]     ?problems  . Morphine Sulfate Other (See Comments)    REACTION: decreased blood pressure   Home medications Prior to Admission medications   Medication Sig Start Date End Date Taking? Authorizing Provider  acetaminophen (TYLENOL) 500 MG tablet Take 500 mg by mouth every 4 (four) hours as needed for mild pain or fever.    [provider]  amLODipine (NORVASC) 5 MG tablet TAKE 1 TABLET BY MOUTH EVERY DAY Patient not taking: Reported on 02/09/2020 09/16/17   Plotnikov, Evie Lacks, MD  aspirin 81 MG EC tablet Take 1 tablet (81 mg total) by mouth daily. 03/15/20   Terrilee Croak, MD  Cholecalciferol (D3-50) 1.25 MG (50000 UT) capsule Take 1 capsule (50,000 Units total) by mouth every 30 (thirty) days. 11/25/17   Plotnikov, Evie Lacks, MD  enoxaparin (LOVENOX) 40 MG/0.4ML injection  Inject 0.4 mLs (40 mg total) into the skin daily. 02/13/20 03/14/20  Delray Alt, PA-C  ferrous sulfate 325 (65 FE) MG tablet Take 1 tablet (325 mg total) by mouth daily with breakfast. 02/12/20   Dahal, Marlowe Aschoff, MD  haloperidol (HALDOL) 1 MG tablet Take 1 tablet (1 mg total) by mouth 3 (three) times daily for 3 days. 02/12/20 02/15/20  Terrilee Croak, MD  loperamide (IMODIUM) 2 MG capsule Take 2 mg by mouth daily as needed for diarrhea or loose stools. Do not exceed 8 doses in 24 hours    [provider]  magnesium hydroxide (MILK OF MAGNESIA) 400 MG/5ML suspension Take 30 mLs by mouth at bedtime as needed for mild constipation.    [provider]  memantine (NAMENDA) 10 MG tablet TAKE 1 TABLET BY MOUTH TWICE A DAY Patient taking differently: Take 10 mg by mouth 2 (two) times daily. 10/03/17   Plotnikov, Evie Lacks, MD  neomycin-bacitracin-polymyxin (NEOSPORIN) [phone] ointment Apply 1 application topically daily as needed (skin tear).    [provider]  nitroGLYCERIN (NITROSTAT) 0.4 MG SL tablet DISSOLVE ONE TABLET UNDER THE TONGUE EVERY 5 MINUTES AS NEEDED FOR CHEST PAIN Patient taking differently: Place 0.4 mg under the tongue every 5 (five) minutes as needed for chest pain. 08/23/16   Plotnikov, Evie Lacks, MD  OLANZapine (ZYPREXA) 10 MG tablet Take 1 tablet (10 mg total) by mouth at bedtime for 5 days. 02/12/20 02/17/20  Terrilee Croak, MD  traMADol (ULTRAM) 50 MG tablet Take 1 tablet (50 mg total) by mouth 2 (two) times daily. 02/12/20   Delray Alt, PA-C  traZODone (DESYREL) 100 MG tablet Take 1 tablet (100 mg total) by mouth at bedtime for 5 days. 02/12/20 02/17/20  Terrilee Croak, MD       Exam Gen pt rolled up in fetal position on L side, not responding to commands, slow respirations prob some Cheynes-Stokes apnea but not severe No rash, cyanosis or gangrene Sclera anicteric, throat dry No jvd or bruits Chest clear bilat to bases, poor resp efforts, not  cooperating RRR no MRG Abd soft ntnd no mass or ascites +bs GU defer MS no joint effusions or deformity R hip small scars well-healed from recent surgery, no erythema or drainage Ext no leg or UE edema, no wounds or ulcer Neuro is poorly responsive, eyes closed, not responding to commands, nonverbal (also nonverbal at baseline per family)   Home meds:  - norvasc 5/ asa 81 mg  - trazodone 100 hs x 5d/ zyprexa 10 hs x 5d/ haldol 1 tid x 3d  - memantine 10 bid/ ultram 50 bid  -  lovenox 40 qd sq  - prn's/ vitamins/ supplements     Na 158  K 3.5  CO2 22 BUN 74  Creat 1.81   Hb 14  WBC 7K  plt 433   Assessment/ Plan: 1. AMS - somnolent and not responding for 1-2 days, fever 101 deg at SNF and 99.1 here.  Fever/ poor po intake causing dehydration and AMS.  Possibly also post op pain meds. CXR neg. Plan is to admit, IVF's and IV abx , urine cx sent, purewick cath.  Suspect UTI, starting emp IV Rocephin. UA still pending.  2. DNR - per dtr at bedside , as prior 3. Dementia - severe/ nonverbal but alert at baseline. Resume memantine and prn ultram when more alert  4. Debility - per dtr, was walking prior to fall and R hip surgery on 1/27 5. R hip fracture- sp ORIF on 1/27, Dr Mardelle Matte.  Wounds well-healed, no sx's infection. 6. Vol depletion - looks dehydrated on exam, sp 1L in ED. Bolus 1.5 L more, then IVF"s as below.  7. AKI -  Creat 1.8, d/t vol depletion baseline creat 0.8- 1.0 from 2 wks ago. Plan IVF"s.  8. Hypernatremia - IVF d5 W at 125 cc/hr  9. HTN - BP's soft, hold norvasc for now      Alice Splinter  MD 03/01/2020, 12:48 PM

## 2020-03-01 NOTE — ED Notes (Signed)
Attempted to in and out cath, a drop of urine came out. PA informed

## 2020-03-01 NOTE — ED Provider Notes (Signed)
[facility] EMERGENCY DEPARTMENT Provider Note   CSN: [id number] Arrival date & time: 03/01/20  8144     History Chief Complaint  Patient presents with  . Altered Mental Status    Alice Wong is a 86 y.o. female.  HPI  Level 5 caveat secondary to dementia 86 year old female with a history of dementia, CVA, depression, GERD, gout, hypertension presents to the ER for altered mental status.  History provided by nursing facility, patient is at [facility] care [facility] rehab currently s/p right hip surgery with Dr. Mardelle Matte on 1/27.  History also provided by daughter at bedside, who states that the patient was more awake yesterday, but is nonverbal at baseline.  However the facility states that she may have been altered for the last several days.  Patient arrived here arousable to sternal rub, moaning, but unable to provide any history.    Past Medical History:  Diagnosis Date  . Anxiety   . Cholelithiasis   . CVA (cerebral vascular accident) ([place])   . Dementia ([place])   . Depression   . Diverticulosis   . GERD (gastroesophageal reflux disease)   . Gout 2009  . H. pylori infection 2011  . Hyperlipidemia   . Hypertension   . Memory loss   . MR (mitral regurgitation)   . Osteoporosis   . S/P cardiac cath 12/20/13 12/20/2013  . Thrombocytopenia ([place])   . Thyroid cyst   . Vaso-vagal reaction   . Vitamin D deficiency     Patient Active Problem List   Diagnosis Date Noted  . Hip fracture ([place]) 02/09/2020  . Agitation 12/09/2017  . Paranoia ([place])   . Incontinence of bowel 08/15/2017  . Chest wall contusion 02/21/2017  . Diarrhea 09/22/2015  . Redness of eye, right 02/15/2015  . Noncompliance with medication regimen 11/15/2014  . Dementia with behavioral disturbance ([place]) 07/28/2014  . Cerebral infarction ([place]) 07/21/2014  . Sternum pain 04/21/2014  . Costochondritis 04/13/2014  . Simple chronic bronchitis ([place]) 04/13/2014  . Cough 03/22/2014  . S/P  cardiac cath 12/20/13, non obsteructive disease 12/20/2013  . Abnormal nuclear stress test, false positive per cath   . Vasovagal syncope 12/17/2013  . Midsternal chest pain 12/17/2013  . Neoplasm of uncertain behavior of skin 10/27/2013  . Uncontrolled type 2 diabetes with renal manifestation ([place]) 11/04/2012  . Pain of right thumb 12/20/2011  . Psychosomatic disorder 08/23/2011  . Tinnitus 06/04/2011  . Headache, post-traumatic, chronic 05/01/2011  . LBP (low back pain) 01/30/2011  . Insomnia 11/19/2010  . Actinic keratoses 05/23/2010  . Dizziness 04/09/2010  . Bruising 04/09/2010  . Thrombocytopenia ([place]) 01/02/2010  . HEMORRHOIDS 12/12/2009  . HELICOBACTER PYLORI INFECTION 03/17/2009  . PARESTHESIA 03/17/2009  . Chest pain, unspecified 08/01/2008  . Vitamin D deficiency 03/03/2008  . GAIT IMBALANCE 10/22/2007  . Gout, unspecified 05/07/2007  . FOOT PAIN 05/07/2007  . Dyslipidemia 02/05/2007  . Anxiety state 02/05/2007  . Depression with anxiety 02/05/2007  . Essential hypertension 02/05/2007  . GERD 02/05/2007  . OSTEOPOROSIS 02/05/2007  . Memory loss 02/05/2007  . History of cardiovascular disorder 02/05/2007    Past Surgical History:  Procedure Laterality Date  . CESAREAN SECTION    . CHOLECYSTECTOMY  2010  . INTRAMEDULLARY (IM) NAIL INTERTROCHANTERIC Right 02/10/2020   Procedure: INTRAMEDULLARY (IM) NAIL INTERTROCHANTRIC;  Surgeon: Marchia Bond, MD;  Location: [facility] ORS;  Service: Orthopedics;  Laterality: Right;  . LASIK    . LEFT HEART CATHETERIZATION WITH CORONARY ANGIOGRAM N/A 12/20/2013  Procedure: LEFT HEART CATHETERIZATION WITH CORONARY ANGIOGRAM;  Surgeon: Burnell Blanks, MD;  Location: [facility] CATH LAB;  Service: Cardiovascular;  Laterality: N/A;     OB History   No obstetric history on file.     Family History  Problem Relation Age of Onset  . Lung disease Father        Deceased  . Hypertension Other   . Stomach cancer Brother   . Ulcers Son    . Colon cancer Neg Hx     Social History   Tobacco Use  . Smoking status: Never Smoker  . Smokeless tobacco: Never Used  Vaping Use  . Vaping Use: Never used  Substance Use Topics  . Alcohol use: No    Alcohol/week: 0.0 standard drinks  . Drug use: No    Types: Methylphenidate    Home Medications Prior to Admission medications   Medication Sig Start Date End Date Taking? Authorizing Provider  acetaminophen (TYLENOL) 500 MG tablet Take 500 mg by mouth every 4 (four) hours as needed for mild pain or fever.    [provider]  amLODipine (NORVASC) 5 MG tablet TAKE 1 TABLET BY MOUTH EVERY DAY Patient not taking: Reported on 02/09/2020 09/16/17   Plotnikov, Evie Lacks, MD  aspirin 81 MG EC tablet Take 1 tablet (81 mg total) by mouth daily. 03/15/20   Terrilee Croak, MD  Cholecalciferol (D3-50) 1.25 MG (50000 UT) capsule Take 1 capsule (50,000 Units total) by mouth every 30 (thirty) days. 11/25/17   Plotnikov, Evie Lacks, MD  enoxaparin (LOVENOX) 40 MG/0.4ML injection Inject 0.4 mLs (40 mg total) into the skin daily. 02/13/20 03/14/20  Delray Alt, PA-C  ferrous sulfate 325 (65 FE) MG tablet Take 1 tablet (325 mg total) by mouth daily with breakfast. 02/12/20   Dahal, Marlowe Aschoff, MD  haloperidol (HALDOL) 1 MG tablet Take 1 tablet (1 mg total) by mouth 3 (three) times daily for 3 days. 02/12/20 02/15/20  Terrilee Croak, MD  loperamide (IMODIUM) 2 MG capsule Take 2 mg by mouth daily as needed for diarrhea or loose stools. Do not exceed 8 doses in 24 hours    [provider]  magnesium hydroxide (MILK OF MAGNESIA) 400 MG/5ML suspension Take 30 mLs by mouth at bedtime as needed for mild constipation.    [provider]  memantine (NAMENDA) 10 MG tablet TAKE 1 TABLET BY MOUTH TWICE A DAY Patient taking differently: Take 10 mg by mouth 2 (two) times daily. 10/03/17   Plotnikov, Evie Lacks, MD  neomycin-bacitracin-polymyxin (NEOSPORIN) [phone] ointment Apply 1 application topically  daily as needed (skin tear).    [provider]  nitroGLYCERIN (NITROSTAT) 0.4 MG SL tablet DISSOLVE ONE TABLET UNDER THE TONGUE EVERY 5 MINUTES AS NEEDED FOR CHEST PAIN Patient taking differently: Place 0.4 mg under the tongue every 5 (five) minutes as needed for chest pain. 08/23/16   Plotnikov, Evie Lacks, MD  OLANZapine (ZYPREXA) 10 MG tablet Take 1 tablet (10 mg total) by mouth at bedtime for 5 days. 02/12/20 02/17/20  Terrilee Croak, MD  traMADol (ULTRAM) 50 MG tablet Take 1 tablet (50 mg total) by mouth 2 (two) times daily. 02/12/20   Delray Alt, PA-C  traZODone (DESYREL) 100 MG tablet Take 1 tablet (100 mg total) by mouth at bedtime for 5 days. 02/12/20 02/17/20  Terrilee Croak, MD    Allergies    Aricept Reather Littler hcl], Coreg [carvedilol], and Morphine sulfate  Review of Systems   Review of Systems  Unable  to perform ROS: Dementia    Physical Exam Updated Vital Signs BP (!) 154/79   Pulse 76   Temp 99.4 F (37.4 C) (Rectal)   Resp 14   SpO2 97%   Physical Exam Vitals and nursing note reviewed.  Constitutional:      General: She is not in acute distress.    Appearance: She is well-developed and well-nourished.  HENT:     Head: Normocephalic and atraumatic.  Eyes:     Conjunctiva/sclera: Conjunctivae normal.  Cardiovascular:     Rate and Rhythm: Normal rate and regular rhythm.     Pulses: Normal pulses.     Heart sounds: No murmur heard.   Pulmonary:     Effort: Pulmonary effort is normal. No respiratory distress.     Breath sounds: Normal breath sounds.  Abdominal:     Palpations: Abdomen is soft.     Tenderness: There is no abdominal tenderness.  Musculoskeletal:        General: No edema. Normal range of motion.     Cervical back: Neck supple.     Comments: Incisions to the right hip without any evidence of redness, swelling, drainage  Skin:    General: Skin is warm and dry.  Neurological:     Mental Status: She is alert.     Comments: Difficult to  assess, patient is arousable to sternal rub, but nonverbal, does not follow commands, moaning  Psychiatric:        Mood and Affect: Mood and affect normal.     ED Results / Procedures / Treatments   Labs (all labs ordered are listed, but only abnormal results are displayed) Labs Reviewed  CBC WITH DIFFERENTIAL/PLATELET - Abnormal; Notable for the following components:      Result Value   Platelets 433 (*)    Lymphs Abs 0.6 (*)    All other components within normal limits  COMPREHENSIVE METABOLIC PANEL - Abnormal; Notable for the following components:   Sodium 158 (*)    Chloride 122 (*)    Glucose, Bld 210 (*)    BUN 74 (*)    Creatinine, Ser 1.81 (*)    Total Bilirubin 1.3 (*)    GFR, Estimated 27 (*)    All other components within normal limits  CULTURE, BLOOD (ROUTINE X 2)  RESP PANEL BY RT-PCR (FLU A&B, COVID) ARPGX2  CULTURE, BLOOD (ROUTINE X 2)  URINE CULTURE  LACTIC ACID, PLASMA  URINALYSIS, ROUTINE W REFLEX MICROSCOPIC    EKG EKG Interpretation  Date/Time:  Wednesday March 01 2020 10:27:36 EST Ventricular Rate:  89 PR Interval:    QRS Duration: 79 QT Interval:  455 QTC Calculation: 554 R Axis:   55 Text Interpretation: Sinus rhythm Probable left ventricular hypertrophy Nonspecific T abnormalities, lateral leads Prolonged QT interval Confirmed by Dene Gentry [phone]) on 03/01/2020 10:28:33 AM   Radiology CT Head Wo Contrast  Result Date: 03/01/2020 CLINICAL DATA:  Altered mental status EXAM: CT HEAD WITHOUT CONTRAST TECHNIQUE: Contiguous axial images were obtained from the base of the skull through the vertex without intravenous contrast. COMPARISON:  None. FINDINGS: Brain: There is atrophy and chronic small vessel disease changes. No acute intracranial abnormality. Specifically, no hemorrhage, hydrocephalus, mass lesion, acute infarction, or significant intracranial injury. Vascular: No hyperdense vessel or unexpected calcification. Skull: No acute  calvarial abnormality. Sinuses/Orbits: Mucosal thickening in the left maxillary sinus. Other: None IMPRESSION: Atrophy, chronic microvascular disease. No acute intracranial abnormality. Chronic left maxillary sinusitis. Electronically Signed   By:  Rolm Baptise M.D.   On: 03/01/2020 12:02   DG Chest Portable 1 View  Result Date: 03/01/2020 CLINICAL DATA:  Altered mental status. EXAM: PORTABLE CHEST 1 VIEW COMPARISON:  February 15, 2020. FINDINGS: The heart size and mediastinal contours are within normal limits. Both lungs are clear. The visualized skeletal structures are unremarkable. IMPRESSION: No active disease. Electronically Signed   By: Marijo Conception M.D.   On: 03/01/2020 10:21    Procedures Procedures   Medications Ordered in ED Medications  sodium chloride 0.9 % bolus 1,000 mL (0 mLs Intravenous Stopped 03/01/20 1254)    ED Course  I have reviewed the triage vital signs and the nursing notes.  Pertinent labs & imaging results that were available during my care of the patient were reviewed by me and considered in my medical decision making (see chart for details).    MDM Rules/Calculators/A&P                          86 year old female who presents to the ER with altered mental status.  Rectal temperature here of 99.4, blood pressures are stable.  Not hypoxic or tachycardic.  Not tachypnea, exam.  She does not meet SIRS criteria.  Incisions to her right hip without evidence of infection.  She is sleepy, but arousable, moaning, nonverbal at baseline.  Will not follow commands.  She is moving all 4 extremities however.   DDx includes stroke, sepsis, dehydration, COVID-19, UTI, dementia, reaction to opioid medication  Labs and imaging, ordered, reviewed and interpreted by me -CBC without leukocytosis, stable hemoglobin -CMP with a sodium of 158, potassium normal.  She also has evidence of an AKI with a creatinine of 1.81 and a BUN of 74.  Glucose of 210.  GFR 27. -Covid test is  negative -Lactic normal -Blood cultures pending  Nursing staff attempted to Internet the patient, however she had very little urine in her bladder.  Plan for fluid resuscitation and repeating.  Chest x-ray without evidence of infection, CT of the head with no evidence of acute stroke.  EKG reviewed by myself my supervising physician, unchanged from previous.  MDM: Overall, suspect patient's symptoms are secondary to dehydration.  Per daughter, the patient normally needs to be fed, however the facility has not been doing this.  She will need admission for fluid resuscitation.  Consulted Dr. Soyla Murphy with the hospitalist team who will admit the patient for further evaluation and treatment.  This was a shared visit with my supervising physician Dr. Francia Greaves who independently saw and evaluated the patient & provided guidance in evaluation/management/disposition ,in agreement with care    Final Clinical Impression(s) / ED Diagnoses Final diagnoses:  Altered mental status, unspecified altered mental status type  Hypernatremia  AKI (acute kidney injury) [facility])    Rx / DC Orders ED Discharge Orders    None       Lyndel Safe 03/01/20 1258    Valarie Merino, MD 03/03/20 [phone]

## 2020-03-02 DIAGNOSIS — I10 Essential (primary) hypertension: Secondary | ICD-10-CM | POA: Diagnosis not present

## 2020-03-02 DIAGNOSIS — F0390 Unspecified dementia without behavioral disturbance: Secondary | ICD-10-CM | POA: Diagnosis not present

## 2020-03-02 DIAGNOSIS — N179 Acute kidney failure, unspecified: Secondary | ICD-10-CM

## 2020-03-02 DIAGNOSIS — R401 Stupor: Secondary | ICD-10-CM | POA: Diagnosis not present

## 2020-03-02 LAB — BASIC METABOLIC PANEL
Anion gap: 12 (ref 5–15)
BUN: 65 mg/dL — ABNORMAL HIGH (ref 8–23)
CO2: 18 mmol/L — ABNORMAL LOW (ref 22–32)
Calcium: 8.7 mg/dL — ABNORMAL LOW (ref 8.9–10.3)
Chloride: 122 mmol/L — ABNORMAL HIGH (ref 98–111)
Creatinine, Ser: 1.38 mg/dL — ABNORMAL HIGH (ref 0.44–1.00)
GFR, Estimated: 38 mL/min — ABNORMAL LOW (ref >60)
Glucose, Bld: 256 mg/dL — ABNORMAL HIGH (ref 70–99)
Potassium: 3.6 mmol/L (ref 3.5–5.1)
Sodium: 152 mmol/L — ABNORMAL HIGH (ref 135–145)

## 2020-03-02 LAB — CBC
HCT: 34.5 % — ABNORMAL LOW (ref 36.0–46.0)
Hemoglobin: 11.3 g/dL — ABNORMAL LOW (ref 12.0–15.0)
MCH: 33 pg (ref 26.0–34.0)
MCHC: 32.8 g/dL (ref 30.0–36.0)
MCV: 100.9 fL — ABNORMAL HIGH (ref 80.0–100.0)
Platelets: 244 10*3/uL (ref 150–400)
RBC: 3.42 MIL/uL — ABNORMAL LOW (ref 3.87–5.11)
RDW: 14.7 % (ref 11.5–15.5)
WBC: 8.5 10*3/uL (ref 4.0–10.5)
nRBC: 0 % (ref 0.0–0.2)

## 2020-03-02 LAB — GLUCOSE, CAPILLARY: Glucose-Capillary: 208 mg/dL — ABNORMAL HIGH (ref 70–99)

## 2020-03-02 MED ORDER — ACETAMINOPHEN 160 MG/5ML PO SOLN
500.0000 mg | Freq: Four times a day (QID) | ORAL | Status: DC | PRN
  Administered 2020-03-02 – 2020-03-05 (×5): 500 mg via ORAL
  Filled 2020-03-02 (×7): qty 20.3

## 2020-03-02 NOTE — Plan of Care (Signed)
  Problem: Education: Goal: Knowledge of General Education information will improve Description: Including pain rating scale, medication(s)/side effects and non-pharmacologic comfort measures Outcome: Not Progressing   Problem: Health Behavior/Discharge Planning: Goal: Ability to manage health-related needs will improve Outcome: Not Progressing   Problem: Clinical Measurements: Goal: Ability to maintain clinical measurements within normal limits will improve Outcome: Not Progressing Goal: Will remain free from infection Outcome: Progressing Goal: Diagnostic test results will improve Outcome: Progressing Goal: Respiratory complications will improve Outcome: Progressing Goal: Cardiovascular complication will be avoided Outcome: Progressing   Problem: Activity: Goal: Risk for activity intolerance will decrease Outcome: Not Progressing   Problem: Nutrition: Goal: Adequate nutrition will be maintained Outcome: Not Progressing   Problem: Coping: Goal: Level of anxiety will decrease Outcome: Not Progressing

## 2020-03-02 NOTE — Progress Notes (Signed)
PROGRESS NOTE  Alice Wong DQQ:[id number] DOB: 02/20/1934 DOA: 03/01/2020 PCP: Cassandria Anger, MD   LOS: 1 day   Brief Narrative / Interim history: 86 year old female with history of depression, dementia, currently living at [facility] came into the hospital by EMS due to poor responsiveness.  She is nonverbal at baseline but usually more awake.  She recently underwent right hip surgery on 1/27 per orthopedic surgery Dr. Mardelle Matte.  Apparently she was febrile at the facility with 101.  She was found to be significantly hyponatremic with sodium 158, have acute kidney injury creatinine 1.8.  Subjective / 24h Interval events: Poorly responsive, moves her arms spontaneously but does not open her eyes or talks  Assessment & Plan: Principal Problem Acute metabolic encephalopathy -Likely multifactorial in the setting of a UTI, hypernatremia, poor p.o. intake, fever -Supportive treatment with IV fluids, antibiotics  Active Problems Acute kidney injury -Creatinine 1.8 on admission, likely prerenal.  Improved with fluids, continue  UTI -Large leukocytes on UA, bacteria present, started on ceftriaxone, continue to monitor cultures  Dementia -Remdesivir at baseline, nonverbal but apparently alert.  Continue fluids, speech consult  Right hip fracture -Status post ORIF on 1/27 Dr. Mardelle Matte  Hypernatremia -Continue D5W, sodium improving   Scheduled Meds: . enoxaparin (LOVENOX) injection  30 mg Subcutaneous Q24H   Continuous Infusions: . cefTRIAXone (ROCEPHIN)  IV Stopped (03/01/20 2213)  . dextrose 5 % with KCl 20 mEq / L 20 mEq (03/02/20 0045)   PRN Meds:.ondansetron **OR** ondansetron (ZOFRAN) IV  Diet Orders (From admission, onward)    Start     Ordered   03/01/20 1539  Diet NPO time specified  Diet effective now        03/01/20 1539          DVT prophylaxis: enoxaparin (LOVENOX) injection 30 mg Start: 03/01/20 1600     Code Status: DNR  Family  Communication: no family at bedside, will call daughter  Status is: Inpatient  Remains inpatient appropriate because:Inpatient level of care appropriate due to severity of illness   Dispo: The patient is from: SNF              Anticipated d/c is to: SNF              Anticipated d/c date is: 2 days              Patient currently is not medically stable to d/c.   Difficult to place patient No  Level of care: Med-Surg  Consultants:  None   Procedures:  None   Microbiology  Urine cultures - pending  Antimicrobials: Ceftriaxone 2/16    Objective: Vitals:   03/01/20 2200 03/02/20 0212 03/02/20 0500 03/02/20 0700  BP: 135/68 (!) 136/96  (!) 150/79  Pulse: (!) 58 72  66  Resp: 18 17  18   Temp:  98.6 F (37 C)  98 F (36.7 C)  TempSrc:  Axillary  Axillary  SpO2: 97% 95%  97%  Weight:   50.4 kg     Intake/Output Summary (Last 24 hours) at 03/02/2020 0929 Last data filed at 03/02/2020 0220 Gross per 24 hour  Intake 2600 ml  Output 0 ml  Net 2600 ml   Filed Weights   03/02/20 0500  Weight: 50.4 kg    Examination:  Constitutional: Poorly responsive, no apparent distress Eyes: no scleral icterus ENMT: Mucous membranes are moist.  Neck: normal, supple Respiratory: clear to auscultation bilaterally, no wheezing, no crackles.   Cardiovascular:  Regular rate and rhythm, no murmurs / rubs / gallops. No LE edema. Good peripheral pulses Abdomen: non distended, no tenderness. Bowel sounds positive.  Musculoskeletal: no clubbing / cyanosis.  Skin: no rashes Neurologic: Does not follow commands   Data Reviewed: I have independently reviewed following labs and imaging studies   CBC: Recent Labs  Lab 03/01/20 0946 03/02/20 0427  WBC 7.2 8.5  NEUTROABS 6.3  --   HGB 14.0 11.3*  HCT 42.9 34.5*  MCV 100.0 100.9*  PLT 433* 628   Basic Metabolic Panel: Recent Labs  Lab 03/01/20 0946 03/02/20 0427  NA 158* 152*  K 3.5 3.6  CL 122* 122*  CO2 22 18*  GLUCOSE 210*  256*  BUN 74* 65*  CREATININE 1.81* 1.38*  CALCIUM 9.5 8.7*   Liver Function Tests: Recent Labs  Lab 03/01/20 0946  AST 18  ALT 21  ALKPHOS 116  BILITOT 1.3*  PROT 7.2  ALBUMIN 3.8   Coagulation Profile: No results for input(s): INR, PROTIME in the last 168 hours. HbA1C: No results for input(s): HGBA1C in the last 72 hours. CBG: Recent Labs  Lab 03/02/20 0319  GLUCAP 208*    Recent Results (from the past 240 hour(s))  Blood culture (routine x 2)     Status: None (Preliminary result)   Collection Time: 03/01/20  9:58 AM   Specimen: BLOOD RIGHT FOREARM  Result Value Ref Range Status   Specimen Description BLOOD RIGHT FOREARM  Final   Special Requests   Final    BOTTLES DRAWN AEROBIC AND ANAEROBIC Blood Culture adequate volume   Culture   Final    NO GROWTH < 24 HOURS Performed at [facility], [place] [address] [place], [place] [address]    Report Status PENDING  Incomplete  Blood culture (routine x 2)     Status: None (Preliminary result)   Collection Time: 03/01/20 10:26 AM   Specimen: BLOOD  Result Value Ref Range Status   Specimen Description BLOOD SITE NOT SPECIFIED  Final   Special Requests   Final    BOTTLES DRAWN AEROBIC AND ANAEROBIC Blood Culture adequate volume   Culture   Final    NO GROWTH < 24 HOURS Performed at [facility], [place] [address], [place], [place] [address]    Report Status PENDING  Incomplete  Resp Panel by RT-PCR (Flu A&B, Covid) Nasopharyngeal Swab     Status: None   Collection Time: 03/01/20 10:35 AM   Specimen: Nasopharyngeal Swab; Nasopharyngeal(NP) swabs in vial transport medium  Result Value Ref Range Status   SARS Coronavirus 2 by RT PCR NEGATIVE NEGATIVE Final    Comment: (NOTE) SARS-CoV-2 target nucleic acids are NOT DETECTED.  The SARS-CoV-2 RNA is generally detectable in upper respiratory specimens during the acute phase of infection. The lowest concentration of SARS-CoV-2 viral copies this assay can detect  is 138 copies/mL. A negative result does not preclude SARS-Cov-2 infection and should not be used as the sole basis for treatment or other patient management decisions. A negative result may occur with  improper specimen collection/handling, submission of specimen other than nasopharyngeal swab, presence of viral mutation(s) within the areas targeted by this assay, and inadequate number of viral copies(<138 copies/mL). A negative result must be combined with clinical observations, patient history, and epidemiological information. The expected result is Negative.  Fact Sheet for Patients:  EntrepreneurPulse.com.au  Fact Sheet for Healthcare Providers:  IncredibleEmployment.be  This test is no t yet approved or cleared  by the [place] and  has been authorized for detection and/or diagnosis of SARS-CoV-2 by FDA under an Emergency Use Authorization (EUA). This EUA will remain  in effect (meaning this test can be used) for the duration of the COVID-19 declaration under Section 564(b)(1) of the Act, 21 U.S.C.section 360bbb-3(b)(1), unless the authorization is terminated  or revoked sooner.       Influenza A by PCR NEGATIVE NEGATIVE Final   Influenza B by PCR NEGATIVE NEGATIVE Final    Comment: (NOTE) The Xpert Xpress SARS-CoV-2/FLU/RSV plus assay is intended as an aid in the diagnosis of influenza from Nasopharyngeal swab specimens and should not be used as a sole basis for treatment. Nasal washings and aspirates are unacceptable for Xpert Xpress SARS-CoV-2/FLU/RSV testing.  Fact Sheet for Patients: EntrepreneurPulse.com.au  Fact Sheet for Healthcare Providers: IncredibleEmployment.be  This test is not yet approved or cleared by the [place] FDA and has been authorized for detection and/or diagnosis of SARS-CoV-2 by FDA under an Emergency Use Authorization (EUA). This EUA will remain in effect  (meaning this test can be used) for the duration of the COVID-19 declaration under Section 564(b)(1) of the Act, 21 U.S.C. section 360bbb-3(b)(1), unless the authorization is terminated or revoked.  Performed at [facility], [place] [address] [place], [place] [address]      Radiology Studies: CT Head Wo Contrast  Result Date: 03/01/2020 CLINICAL DATA:  Altered mental status EXAM: CT HEAD WITHOUT CONTRAST TECHNIQUE: Contiguous axial images were obtained from the base of the skull through the vertex without intravenous contrast. COMPARISON:  None. FINDINGS: Brain: There is atrophy and chronic small vessel disease changes. No acute intracranial abnormality. Specifically, no hemorrhage, hydrocephalus, mass lesion, acute infarction, or significant intracranial injury. Vascular: No hyperdense vessel or unexpected calcification. Skull: No acute calvarial abnormality. Sinuses/Orbits: Mucosal thickening in the left maxillary sinus. Other: None IMPRESSION: Atrophy, chronic microvascular disease. No acute intracranial abnormality. Chronic left maxillary sinusitis. Electronically Signed   By: Rolm Baptise M.D.   On: 03/01/2020 12:02   DG Chest Portable 1 View  Result Date: 03/01/2020 CLINICAL DATA:  Altered mental status. EXAM: PORTABLE CHEST 1 VIEW COMPARISON:  February 15, 2020. FINDINGS: The heart size and mediastinal contours are within normal limits. Both lungs are clear. The visualized skeletal structures are unremarkable. IMPRESSION: No active disease. Electronically Signed   By: Marijo Conception M.D.   On: 03/01/2020 10:21   Marzetta Board, MD, PhD [facility]  Between 7 am - 7 pm I am available, please contact me via Amion or Securechat  Between 7 pm - 7 am I am not available, please contact night coverage MD/APP via Amion

## 2020-03-02 NOTE — Evaluation (Signed)
Clinical/Bedside Swallow Evaluation Patient Details  Name: Alice Wong MRN: [id number] Date of Birth: [birth year]  Today's Date: 03/02/2020 Time: SLP Start Time (ACUTE ONLY): 0907 SLP Stop Time (ACUTE ONLY): 0931 SLP Time Calculation (min) (ACUTE ONLY): 24 min  Past Medical History:  Past Medical History:  Diagnosis Date  . Anxiety   . Cholelithiasis   . CVA (cerebral vascular accident) ([place])   . Dementia ([place])   . Depression   . Diverticulosis   . GERD (gastroesophageal reflux disease)   . Gout 2009  . H. pylori infection 2011  . Hyperlipidemia   . Hypertension   . Memory loss   . MR (mitral regurgitation)   . Osteoporosis   . S/P cardiac cath 12/20/13 12/20/2013  . Thrombocytopenia ([place])   . Thyroid cyst   . Vaso-vagal reaction   . Vitamin D deficiency    Past Surgical History:  Past Surgical History:  Procedure Laterality Date  . CESAREAN SECTION    . CHOLECYSTECTOMY  2010  . INTRAMEDULLARY (IM) NAIL INTERTROCHANTERIC Right 02/10/2020   Procedure: INTRAMEDULLARY (IM) NAIL INTERTROCHANTRIC;  Surgeon: Marchia Bond, MD;  Location: [facility]S;  Service: Orthopedics;  Laterality: Right;  . LASIK    . LEFT HEART CATHETERIZATION WITH CORONARY ANGIOGRAM N/A 12/20/2013   Procedure: LEFT HEART CATHETERIZATION WITH CORONARY ANGIOGRAM;  Surgeon: Burnell Blanks, MD;  Location: [facility];  Service: Cardiovascular;  Laterality: N/A;   HPI:  Pt is an 86 y.o. year-old with hx of HTN, HL, H Pyloir, gout, depression, dementia/ memory loss, CVA, anxiety who was brought to the ED from the from [facility], [place] secondary to confusion. Pt is nonverbal at baseline. CT head was negative. CXR 2/15 was negative.   Assessment / Plan / Recommendation Clinical Impression  Pt was seen for bedside swallow evaluation. [employer] [address] [place], [place] ([place] [id number]), was used for translation. Pt's daughter, Alice Wong, was contacted via phone and she denied the pt having any  symptoms of pharyngeal dysphagia at baseline. However, she reported that the pt eats soft foods, needs her meats to be cut,  and is dependent for feeding. Pt was unable to participate in a complete a oral mechanism exam due to her difficulty following commands, but dentition was adequate. She demonstrated symptoms of oropharyngeal dysphagia characterized by reduced bolus awareness, impaired bolus manipulation, oral holding of purees for up to 30-50 seconds, and coughing following seven consecutive swallows of thin liquids via straw. Duration of oral holding increased with each additional bolus of puree. Pt tolerated >8 individual sips and up to two consecutive swallows of thin liquids via straw without overt s/sx of aspiration. A clear liquid diet is recommended at this time. SLP will follow to assess tolerance and readiness for advancement. SLP Visit Diagnosis: Dysphagia, unspecified (R13.10)    Aspiration Risk  Moderate aspiration risk    Diet Recommendation Thin liquid (clear liquids)   Liquid Administration via: Cup;Spoon;Straw Medication Administration: Crushed with puree Supervision: Full supervision/cueing for compensatory strategies (staff to feed pt) Compensations: Slow rate;Small sips/bites Postural Changes: Seated upright at 90 degrees    Other  Recommendations Oral Care Recommendations: Oral care BID   Follow up Recommendations  (TBD)      Frequency and Duration min 2x/week  2 weeks       Prognosis Prognosis for Safe Diet Advancement: Fair Barriers to Reach Goals: Cognitive deficits      Swallow Study   General Date of Onset: 03/01/20 HPI: Pt is an  86 y.o. year-old with hx of HTN, HL, H Pyloir, gout, depression, dementia/ memory loss, CVA, anxiety who was brought to the ED from the from [facility], [place] secondary to confusion. Pt is nonverbal at baseline. CT head was negative. CXR 2/15 was negative. Type of Study: Bedside Swallow Evaluation Previous Swallow Assessment:  None Diet Prior to this Study: NPO Temperature Spikes Noted: No Respiratory Status: Room air History of Recent Intubation: No Behavior/Cognition: Alert;Pleasant mood;Doesn't follow directions;Confused Oral Care Completed by SLP: Recent completion by staff Oral Cavity - Dentition: Adequate natural dentition Self-Feeding Abilities: Able to feed self Patient Positioning: Upright in bed;Postural control interferes with function Baseline Vocal Quality: Not observed Volitional Cough: Cognitively unable to elicit Volitional Swallow: Unable to elicit    Oral/Motor/Sensory Function Overall Oral Motor/Sensory Function:  (Pt unable to participate)   Ice Chips Ice chips: Within functional limits Presentation: Spoon   Thin Liquid Thin Liquid: Impaired Presentation: Straw;Cup;Spoon Pharyngeal  Phase Impairments: Cough - Immediate;Cough - Delayed    Nectar Thick Nectar Thick Liquid: Not tested   Honey Thick Honey Thick Liquid: Not tested   Puree Puree: Impaired Presentation: Spoon Oral Phase Impairments: Poor awareness of bolus;Reduced lingual movement/coordination Oral Phase Functional Implications: Oral holding;Oral residue   Solid     Solid: Impaired Oral Phase Impairments: Impaired mastication;Poor awareness of bolus     [name] I. Hardin Negus, [place], [place] Office number [phone] Pager [phone]  Horton Marshall 03/02/2020,9:58 AM

## 2020-03-02 NOTE — Progress Notes (Signed)
Patient transferred to floor from ED. Patient does not respond to orientation questions and has a blank stare.Limb contractions are apparent and patient moans to movement and pain. Orders will be followed through and physician will be notified if changes occur.

## 2020-03-03 ENCOUNTER — Inpatient Hospital Stay (HOSPITAL_COMMUNITY): Payer: Medicare Other

## 2020-03-03 DIAGNOSIS — R4182 Altered mental status, unspecified: Secondary | ICD-10-CM | POA: Diagnosis not present

## 2020-03-03 DIAGNOSIS — N179 Acute kidney failure, unspecified: Secondary | ICD-10-CM | POA: Diagnosis not present

## 2020-03-03 DIAGNOSIS — F0390 Unspecified dementia without behavioral disturbance: Secondary | ICD-10-CM | POA: Diagnosis not present

## 2020-03-03 LAB — BASIC METABOLIC PANEL
Anion gap: 11 (ref 5–15)
BUN: 31 mg/dL — ABNORMAL HIGH (ref 8–23)
CO2: 20 mmol/L — ABNORMAL LOW (ref 22–32)
Calcium: 8.7 mg/dL — ABNORMAL LOW (ref 8.9–10.3)
Chloride: 114 mmol/L — ABNORMAL HIGH (ref 98–111)
Creatinine, Ser: 0.89 mg/dL (ref 0.44–1.00)
GFR, Estimated: >60 mL/min (ref >60)
Glucose, Bld: 210 mg/dL — ABNORMAL HIGH (ref 70–99)
Potassium: 3.9 mmol/L (ref 3.5–5.1)
Sodium: 145 mmol/L (ref 135–145)

## 2020-03-03 LAB — CBC
HCT: 33.6 % — ABNORMAL LOW (ref 36.0–46.0)
Hemoglobin: 10.8 g/dL — ABNORMAL LOW (ref 12.0–15.0)
MCH: 31.8 pg (ref 26.0–34.0)
MCHC: 32.1 g/dL (ref 30.0–36.0)
MCV: 98.8 fL (ref 80.0–100.0)
Platelets: 170 10*3/uL (ref 150–400)
RBC: 3.4 MIL/uL — ABNORMAL LOW (ref 3.87–5.11)
RDW: 14.3 % (ref 11.5–15.5)
WBC: 6.9 10*3/uL (ref 4.0–10.5)
nRBC: 0 % (ref 0.0–0.2)

## 2020-03-03 MED ORDER — POTASSIUM CL IN DEXTROSE 5% 20 MEQ/L IV SOLN
20.0000 meq | INTRAVENOUS | Status: DC
  Administered 2020-03-03 – 2020-03-05 (×4): 20 meq via INTRAVENOUS
  Filled 2020-03-03 (×4): qty 1000

## 2020-03-03 MED ORDER — HYDRALAZINE HCL 20 MG/ML IJ SOLN
10.0000 mg | INTRAMUSCULAR | Status: DC | PRN
  Administered 2020-03-06: 10 mg via INTRAVENOUS
  Filled 2020-03-03: qty 1

## 2020-03-03 MED ORDER — HYDRALAZINE HCL 10 MG PO TABS
10.0000 mg | ORAL_TABLET | Freq: Three times a day (TID) | ORAL | Status: DC
Start: 2020-03-03 — End: 2020-03-08
  Administered 2020-03-03 – 2020-03-07 (×10): 10 mg via ORAL
  Filled 2020-03-03 (×13): qty 1

## 2020-03-03 NOTE — Progress Notes (Signed)
  Speech Language Pathology Treatment: Dysphagia  Patient Details Name: Alice Wong MRN: [id number] DOB: 09/22/1934 Today's Date: 03/03/2020 Time: 3790-2409 SLP Time Calculation (min) (ACUTE ONLY): 13 min  Assessment / Plan / Recommendation Clinical Impression  Pt was seen for dysphagia treatment. [employer] [place] interpreter, [place], (ID# [id number]) was used for translation. She was alert and cooperative during the session. Pt's NT reported that the pt demonstrated some coughing with thin liquids yesterday. Pt was coughing upon SLP's arrival after administration of liquid Tylenol from RN. Pt's positioning was suboptimal and SLP questioned the impact of this on her performance. However, she remained inconsistently symptomatic of aspiration with thin liquids after repositioning. No s/sx of aspiration were noted with puree solids and oral holding was notably less significant today. A modified barium swallow study is recommended to further assess swallow function and that trays be held until it is completed.    HPI HPI: Pt is an 86 y.o. year-old with hx of HTN, HL, H Pyloir, gout, depression, dementia/ memory loss, CVA, anxiety who was brought to the ED from the from [facility], [place] secondary to confusion. Pt is nonverbal at baseline. CT head was negative. CXR 2/15 was negative.      SLP Plan  MBS       Recommendations  Diet recommendations: NPO Medication Administration: Crushed with puree Compensations: Slow rate;Small sips/bites Postural Changes and/or Swallow Maneuvers: Seated upright 90 degrees                Oral Care Recommendations: Oral care BID Follow up Recommendations:  (TBD) SLP Visit Diagnosis: Dysphagia, unspecified (R13.10) Plan: MBS       Betha Shadix I. Hardin Negus, [place], [place] Office number [phone] Pager [place] 03/03/2020, 9:22 AM

## 2020-03-03 NOTE — Plan of Care (Signed)
  Problem: Education: Goal: Knowledge of General Education information will improve Description: Including pain rating scale, medication(s)/side effects and non-pharmacologic comfort measures Outcome: Not Progressing   Problem: Health Behavior/Discharge Planning: Goal: Ability to manage health-related needs will improve Outcome: Not Progressing   Problem: Clinical Measurements: Goal: Ability to maintain clinical measurements within normal limits will improve Outcome: Not Progressing Goal: Will remain free from infection Outcome: Not Progressing Goal: Diagnostic test results will improve Outcome: Not Progressing Goal: Respiratory complications will improve Outcome: Not Progressing Goal: Cardiovascular complication will be avoided Outcome: Not Progressing   Problem: Nutrition: Goal: Adequate nutrition will be maintained Outcome: Not Progressing   Problem: Coping: Goal: Level of anxiety will decrease Outcome: Not Progressing   Problem: Elimination: Goal: Will not experience complications related to bowel motility Outcome: Not Progressing Goal: Will not experience complications related to urinary retention Outcome: Not Progressing   Problem: Pain Managment: Goal: General experience of comfort will improve Outcome: Not Progressing   Problem: Safety: Goal: Ability to remain free from injury will improve Outcome: Not Progressing   Problem: Skin Integrity: Goal: Risk for impaired skin integrity will decrease Outcome: Not Progressing   

## 2020-03-03 NOTE — Progress Notes (Signed)
Modified Barium Swallow Progress Note  Patient Details  Name: Alice Wong MRN: [id number] Date of Birth: 05/05/1934  Today's Date: 03/03/2020  Modified Barium Swallow completed.  Full report located under Chart Review in the Imaging Section.  Brief recommendations include the following:  Clinical Impression  [employer] [place] interpreter, [name] (ID# [phone]) was used for translation during the study. Pt presented with oropharyngeal dysphagia characterized by reduced bolus awareness, impaired posterior bolus propulsion, impaired bolus cohesion, a pharyngeal delay. She demonstrated prolonged oral transit, difficulty with A-P transport with lingual pumping, and premature spillage to the valleculae with spillover to the pyriform sinuses. Oral holding and oral transit delays were most significant with dysphagia 1 solids, but notably more functional with solids which required mastication. She inconsistently demonstrated penetration (PAS 5) aspiration (PAS 7) of thin liquids secondary to premature spillage to the pyriform sinuses and a pharyngeal delay. Coughing was noted following aspiration and was partially effective. A dysphagia 2 diet with nectar thick liquids is recommended at this time. Considering pt's reported functional swallow at baseline, pt's dysphagia is likely at least partly cognitively based. SLP will follow to assess improvement as mentation improves further.   Swallow Evaluation Recommendations       SLP Diet Recommendations: Dysphagia 2 (Fine chop) solids;Nectar thick liquid   Liquid Administration via: Cup;Straw   Medication Administration: Crushed with puree   Supervision: Staff to assist with self feeding;Full supervision/cueing for compensatory strategies   Compensations: Slow rate;Small sips/bites   Postural Changes: Remain semi-upright after after feeds/meals (Comment)         Amoy Steeves I. Hardin Negus, [place], [place] Office  number [phone] Pager [phone]   Horton Marshall 03/03/2020,3:07 PM

## 2020-03-03 NOTE — Progress Notes (Addendum)
PROGRESS NOTE  Alice Wong YQM:[id number] DOB: May 14, 1934 DOA: 03/01/2020 PCP: Cassandria Anger, MD   LOS: 2 days   Brief Narrative / Interim history: 86 year old female with history of depression, dementia, currently living at [facility] came into the hospital by EMS due to poor responsiveness.  She is nonverbal at baseline but usually more awake.  She recently underwent right hip surgery on 1/27 per orthopedic surgery Dr. Mardelle Matte.  Apparently she was febrile at the facility with 101.  She was found to be significantly hyponatremic with sodium 158, have acute kidney injury creatinine 1.8.  Subjective / 24h Interval events: She is more alert today, eyes are wide open, nonverbal  Assessment & Plan: Principal Problem Acute metabolic encephalopathy -Likely multifactorial in the setting of a UTI, hypernatremia, poor p.o. intake, fever -Continue supportive treatment with IV fluids, antibiotics, seems to be gradually returning to baseline but not yet there according to the daughter  Active Problems Acute kidney injury -Creatinine 1.8 on admission, likely prerenal due to poor p.o. intake, has normalized this morning.  UTI, sepsis ruled out -Large leukocytes on UA, bacteria present, started on ceftriaxone, continue, primary culture shows E. coli  Dementia -nonverbal but apparently alert, eating, walking with assistance.  Continue fluids, speech consult  Right hip fracture -Status post ORIF on 1/27 Dr. Mardelle Matte.  PT consult pending  Hypernatremia -Continue D5W, sodium normalized this morning, decrease rate   Scheduled Meds: . enoxaparin (LOVENOX) injection  30 mg Subcutaneous Q24H  . hydrALAZINE  10 mg Oral Q8H   Continuous Infusions: . cefTRIAXone (ROCEPHIN)  IV Stopped (03/02/20 1900)  . dextrose 5 % with KCl 20 mEq / L     PRN Meds:.acetaminophen (TYLENOL) oral liquid 160 mg/5 mL, hydrALAZINE, ondansetron **OR** ondansetron (ZOFRAN) IV  Diet Orders (From admission,  onward)    Start     Ordered   03/02/20 1003  Diet clear liquid Room service appropriate? Yes; Fluid consistency: Thin  Diet effective now       Question Answer Comment  Room service appropriate? Yes   Fluid consistency: Thin      03/02/20 1002          DVT prophylaxis: enoxaparin (LOVENOX) injection 30 mg Start: 03/01/20 1600     Code Status: DNR  Family Communication: Daughter at bedside  Status is: Inpatient  Remains inpatient appropriate because:Inpatient level of care appropriate due to severity of illness   Dispo: The patient is from: SNF              Anticipated d/c is to: SNF              Anticipated d/c date is: 2 days              Patient currently is not medically stable to d/c.   Difficult to place patient No  Level of care: Med-Surg  Consultants:  None   Procedures:  None   Microbiology  Urine cultures - pending  Antimicrobials: Ceftriaxone 2/16    Objective: Vitals:   03/02/20 0500 03/02/20 0700 03/02/20 2039 03/03/20 0753  BP:  (!) 150/79 (!) 150/80 (!) 180/97  Pulse:  66 70 68  Resp:  18 20 19   Temp:  98 F (36.7 C) 97.9 F (36.6 C) 97.8 F (36.6 C)  TempSrc:  Axillary Tympanic Axillary  SpO2:  97% 98% 100%  Weight: 50.4 kg       Intake/Output Summary (Last 24 hours) at 03/03/2020 1015 Last data filed at  03/02/2020 2100 Gross per 24 hour  Intake -  Output 650 ml  Net -650 ml   Filed Weights   03/02/20 0500  Weight: 50.4 kg    Examination:  Constitutional: No distress, wide awake but nonverbal Eyes: No scleral icterus ENMT: moist mucous membranes Neck: normal, supple Respiratory: Lungs are clear bilaterally, no wheezing, no crackles Cardiovascular: Regular rate and rhythm, no murmurs, no peripheral edema Abdomen: Soft, nontender, nondistended, bowel sounds positive Musculoskeletal: no clubbing / cyanosis.  Skin: No rashes seen Neurologic: Appears nonfocal but does not follow commands consistently   Data Reviewed: I  have independently reviewed following labs and imaging studies   CBC: Recent Labs  Lab 03/01/20 0946 03/02/20 0427 03/03/20 0431  WBC 7.2 8.5 6.9  NEUTROABS 6.3  --   --   HGB 14.0 11.3* 10.8*  HCT 42.9 34.5* 33.6*  MCV 100.0 100.9* 98.8  PLT 433* 244 518   Basic Metabolic Panel: Recent Labs  Lab 03/01/20 0946 03/02/20 0427 03/03/20 0431  NA 158* 152* 145  K 3.5 3.6 3.9  CL 122* 122* 114*  CO2 22 18* 20*  GLUCOSE 210* 256* 210*  BUN 74* 65* 31*  CREATININE 1.81* 1.38* 0.89  CALCIUM 9.5 8.7* 8.7*   Liver Function Tests: Recent Labs  Lab 03/01/20 0946  AST 18  ALT 21  ALKPHOS 116  BILITOT 1.3*  PROT 7.2  ALBUMIN 3.8   Coagulation Profile: No results for input(s): INR, PROTIME in the last 168 hours. HbA1C: No results for input(s): HGBA1C in the last 72 hours. CBG: Recent Labs  Lab 03/02/20 0319  GLUCAP 208*    Recent Results (from the past 240 hour(s))  Urine culture     Status: Abnormal (Preliminary result)   Collection Time: 03/01/20  9:46 AM   Specimen: Urine, Random  Result Value Ref Range Status   Specimen Description URINE, RANDOM  Final   Special Requests NONE  Final   Culture (A)  Final    >=100,000 COLONIES/mL ESCHERICHIA COLI SUSCEPTIBILITIES TO FOLLOW Performed at [facility], [place] [address] [place], [place] [address]    Report Status PENDING  Incomplete  Blood culture (routine x 2)     Status: None (Preliminary result)   Collection Time: 03/01/20  9:58 AM   Specimen: BLOOD RIGHT FOREARM  Result Value Ref Range Status   Specimen Description BLOOD RIGHT FOREARM  Final   Special Requests   Final    BOTTLES DRAWN AEROBIC AND ANAEROBIC Blood Culture adequate volume   Culture   Final    NO GROWTH < 24 HOURS Performed at [facility], [place] [address] [place], [place] [address]    Report Status PENDING  Incomplete  Blood culture (routine x 2)     Status: None (Preliminary result)   Collection Time: 03/01/20 10:26 AM    Specimen: BLOOD  Result Value Ref Range Status   Specimen Description BLOOD SITE NOT SPECIFIED  Final   Special Requests   Final    BOTTLES DRAWN AEROBIC AND ANAEROBIC Blood Culture adequate volume   Culture   Final    NO GROWTH < 24 HOURS Performed at [facility], [place] [address], [place], [place] [address]    Report Status PENDING  Incomplete  Resp Panel by RT-PCR (Flu A&B, Covid) Nasopharyngeal Swab     Status: None   Collection Time: 03/01/20 10:35 AM   Specimen: Nasopharyngeal Swab; Nasopharyngeal(NP) swabs in vial transport medium  Result Value  Ref Range Status   SARS Coronavirus 2 by RT PCR NEGATIVE NEGATIVE Final    Comment: (NOTE) SARS-CoV-2 target nucleic acids are NOT DETECTED.  The SARS-CoV-2 RNA is generally detectable in upper respiratory specimens during the acute phase of infection. The lowest concentration of SARS-CoV-2 viral copies this assay can detect is 138 copies/mL. A negative result does not preclude SARS-Cov-2 infection and should not be used as the sole basis for treatment or other patient management decisions. A negative result may occur with  improper specimen collection/handling, submission of specimen other than nasopharyngeal swab, presence of viral mutation(s) within the areas targeted by this assay, and inadequate number of viral copies(<138 copies/mL). A negative result must be combined with clinical observations, patient history, and epidemiological information. The expected result is Negative.  Fact Sheet for Patients:  EntrepreneurPulse.com.au  Fact Sheet for Healthcare Providers:  IncredibleEmployment.be  This test is no t yet approved or cleared by the [place] FDA and  has been authorized for detection and/or diagnosis of SARS-CoV-2 by FDA under an Emergency Use Authorization (EUA). This EUA will remain  in effect (meaning this test can be used) for the duration of the COVID-19 declaration  under Section 564(b)(1) of the Act, 21 U.S.C.section 360bbb-3(b)(1), unless the authorization is terminated  or revoked sooner.       Influenza A by PCR NEGATIVE NEGATIVE Final   Influenza B by PCR NEGATIVE NEGATIVE Final    Comment: (NOTE) The Xpert Xpress SARS-CoV-2/FLU/RSV plus assay is intended as an aid in the diagnosis of influenza from Nasopharyngeal swab specimens and should not be used as a sole basis for treatment. Nasal washings and aspirates are unacceptable for Xpert Xpress SARS-CoV-2/FLU/RSV testing.  Fact Sheet for Patients: EntrepreneurPulse.com.au  Fact Sheet for Healthcare Providers: IncredibleEmployment.be  This test is not yet approved or cleared by the [place] FDA and has been authorized for detection and/or diagnosis of SARS-CoV-2 by FDA under an Emergency Use Authorization (EUA). This EUA will remain in effect (meaning this test can be used) for the duration of the COVID-19 declaration under Section 564(b)(1) of the Act, 21 U.S.C. section 360bbb-3(b)(1), unless the authorization is terminated or revoked.  Performed at [facility], [place] [address], [place], [place] [address]      Radiology Studies: No results found. Marzetta Board, MD, PhD [facility]  Between 7 am - 7 pm I am available, please contact me via Amion or Securechat  Between 7 pm - 7 am I am not available, please contact night coverage MD/APP via Amion

## 2020-03-04 DIAGNOSIS — R4182 Altered mental status, unspecified: Secondary | ICD-10-CM | POA: Diagnosis not present

## 2020-03-04 DIAGNOSIS — Z7189 Other specified counseling: Secondary | ICD-10-CM

## 2020-03-04 DIAGNOSIS — F0390 Unspecified dementia without behavioral disturbance: Secondary | ICD-10-CM | POA: Diagnosis not present

## 2020-03-04 DIAGNOSIS — N179 Acute kidney failure, unspecified: Secondary | ICD-10-CM | POA: Diagnosis not present

## 2020-03-04 DIAGNOSIS — Z515 Encounter for palliative care: Secondary | ICD-10-CM

## 2020-03-04 LAB — BASIC METABOLIC PANEL
Anion gap: 16 — ABNORMAL HIGH (ref 5–15)
BUN: 19 mg/dL (ref 8–23)
CO2: 14 mmol/L — ABNORMAL LOW (ref 22–32)
Calcium: 9 mg/dL (ref 8.9–10.3)
Chloride: 115 mmol/L — ABNORMAL HIGH (ref 98–111)
Creatinine, Ser: 0.78 mg/dL (ref 0.44–1.00)
GFR, Estimated: >60 mL/min (ref >60)
Glucose, Bld: 182 mg/dL — ABNORMAL HIGH (ref 70–99)
Potassium: 3.8 mmol/L (ref 3.5–5.1)
Sodium: 145 mmol/L (ref 135–145)

## 2020-03-04 LAB — URINE CULTURE: Culture: >=100000 — AB

## 2020-03-04 MED ORDER — ENOXAPARIN SODIUM 40 MG/0.4ML ~~LOC~~ SOLN
40.0000 mg | SUBCUTANEOUS | Status: DC
  Administered 2020-03-04 – 2020-03-07 (×4): 40 mg via SUBCUTANEOUS
  Filled 2020-03-04 (×4): qty 0.4

## 2020-03-04 NOTE — Consult Note (Signed)
Consultation Note Date: 03/04/2020   Patient Name: Alice Wong  DOB: 1934/11/24  MRN: [id number]  Age / Sex: 86 y.o., female  PCP: Plotnikov, Evie Lacks, MD Referring Physician: Caren Griffins, MD  Reason for Consultation: Establishing goals of care  HPI/Patient Profile: 86 y.o. female  with past medical history of hypertension, hyperlipidemia, H. Pylori, gout, depression, dementia, stroke, anxiety, R hip surgery 02/10/20 (walking prior to fall and surgery) admitted from [facility] on 03/01/2020 with decreased responsiveness for ~2 days and found to have dehydration and AKI with UTI. Noted to be nonverbal at baseline (speaks only [place]) and does not follow commands. Memory issues for ~4-5 years and living in facility since 2019. SLP has recommended dysphagia 2, nectar liquid diet.   Clinical Assessment and Goals of Care: I met today at Ms. Nathifa's bedside but no family present. Ms. Kasie is resting in bed. She looks at me but mainly just mumbles and then begins to cry. She is elderly and frail and with advanced dementia.   I called and spoke with daughter, [name]. I had a good long conversation with [name] regarding her mother's condition with UTI/dehydration and now dysphagia. We discussed progression of dementia over the years and poor intake since hip surgery a few weeks ago. We discussed appetite and swallowing and how these decline as dementia progresses and often decline with surgeries, infections, hospitalizations. We discussed expectations with progressing dementia and common complications. We discussed declining quality of life. I also explained to [name] that sometimes when people with advanced dementia continue to decline and have frequent complications that we can consider shifting the focus to keeping them happy and comfortable. Sometimes it is not worth putting them through more lab sticks and tests  and hospital stays if it is not really fixing anything and improving quality of life. [name] was very receptive to this concept. She tells me that she has always known that the day would come when this needs to be considered. She knows that her mother would never want to be prolonged by machines and [name] feels that artificial feeding would be the same as being prolonged by machines and her mother would not want that.   Teresa has good understanding but it is still a lot of information to process. She requests time to process and would like to meet with me Monday 1615. We will discussed plan to move forward Monday.   All questions/concerns addressed. Emotional support provided.   Primary Decision Maker NEXT OF KIN daughter [name]    SUMMARY OF RECOMMENDATIONS   - DNR, NO artificial feeding - Daughter considering goals moving forward - I will meet with her Monday 03/06/20 1615 for further discussion  Code Status/Advance Care Planning:  DNR   Symptom Management:   Per attending.   Palliative Prophylaxis:   Delirium Protocol, Frequent Pain Assessment, Oral Care and Turn Reposition  Additional Recommendations (Limitations, Scope, Preferences):  TBD  Psycho-social/Spiritual:   Desire for further Chaplaincy support:no  Additional Recommendations: Education on Hospice and  Grief/Bereavement Support  Prognosis:   Overall prognosis poor. Likely < 6 months.   Discharge Planning: To Be Determined      Primary Diagnoses: Present on Admission: . Essential hypertension . Dementia without behavioral disturbance ([place]) . AKI (acute kidney injury) ([place]) . Volume depletion . Altered mental status . UTI (urinary tract infection) . DNR (do not resuscitate) . Debility . Sepsis ([place])   I have reviewed the medical record, interviewed the patient and family, and examined the patient. The following aspects are pertinent.  Past Medical History:  Diagnosis Date  . Anxiety   .  Cholelithiasis   . CVA (cerebral vascular accident) ([place])   . Dementia ([place])   . Depression   . Diverticulosis   . GERD (gastroesophageal reflux disease)   . Gout 2009  . H. pylori infection 2011  . Hyperlipidemia   . Hypertension   . Memory loss   . MR (mitral regurgitation)   . Osteoporosis   . S/P cardiac cath 12/20/13 12/20/2013  . Thrombocytopenia ([place])   . Thyroid cyst   . Vaso-vagal reaction   . Vitamin D deficiency    Social History   Socioeconomic History  . Marital status: Widowed    Spouse name: Not on file  . Number of children: 3  . Years of education: Not on file  . Highest education level: Not on file  Occupational History  . Occupation: RETIRED  Tobacco Use  . Smoking status: Never Smoker  . Smokeless tobacco: Never Used  Vaping Use  . Vaping Use: Never used  Substance and Sexual Activity  . Alcohol use: No    Alcohol/week: 0.0 standard drinks  . Drug use: No    Types: Methylphenidate  . Sexual activity: Never  Other Topics Concern  . Not on file  Social History Narrative   Family History Hypertension   No caffeine    Lives with husband in a 2 story home.  Retired.    Moved from [place] in 2002   Social Determinants of Health   Financial Resource Strain: Not on file  Food Insecurity: Not on file  Transportation Needs: Not on file  Physical Activity: Not on file  Stress: Not on file  Social Connections: Not on file   Family History  Problem Relation Age of Onset  . Lung disease Father        Deceased  . Hypertension Other   . Stomach cancer Brother   . Ulcers Son   . Colon cancer Neg Hx    Scheduled Meds: . enoxaparin (LOVENOX) injection  40 mg Subcutaneous Q24H  . hydrALAZINE  10 mg Oral Q8H   Continuous Infusions: . cefTRIAXone (ROCEPHIN)  IV Stopped (03/03/20 1905)  . dextrose 5 % with KCl 20 mEq / L 20 mEq (03/04/20 0837)   PRN Meds:.acetaminophen (TYLENOL) oral liquid 160 mg/5 mL, hydrALAZINE, ondansetron **OR** ondansetron  (ZOFRAN) IV Allergies  Allergen Reactions  . Aricept [Donepezil Hcl] Other (See Comments)    Per MAR  . Coreg [Carvedilol] Other (See Comments)    Per MAR  . Morphine Sulfate Other (See Comments)    REACTION: decreased blood pressure   Review of Systems  Unable to perform ROS: Dementia    Physical Exam Vitals and nursing note reviewed.  Constitutional:      General: She is not in acute distress.    Appearance: She is ill-appearing.     Comments: Thin, frail   Cardiovascular:     Rate and Rhythm: Normal  rate.  Pulmonary:     Effort: Pulmonary effort is normal. No tachypnea, accessory muscle usage or respiratory distress.  Abdominal:     General: Abdomen is flat.     Palpations: Abdomen is soft.  Neurological:     Mental Status: She is alert. She is confused.     Vital Signs: BP 122/76 (BP Location: Left Arm)   Pulse 75   Temp 98 F (36.7 C) (Oral)   Resp 16   Wt 50.4 kg   SpO2 100%   BMI 19.07 kg/m  Pain Scale: Faces POSS *See Group Information*: 1-Acceptable,Awake and alert Pain Score: 0-No pain   SpO2: SpO2: 100 % O2 Device:SpO2: 100 % O2 Flow Rate: .   IO: Intake/output summary:   Intake/Output Summary (Last 24 hours) at 03/04/2020 1453 Last data filed at 03/03/2020 2244 Gross per 24 hour  Intake 407.91 ml  Output 100 ml  Net 307.91 ml    LBM: Last BM Date: 03/04/20 Baseline Weight: Weight: 50.4 kg Most recent weight: Weight: 50.4 kg     Palliative Assessment/Data:     Time In: 1515 Time Out: 1615 Time Total: 60 min Greater than 50%  of this time was spent counseling and coordinating care related to the above assessment and plan.  Signed by: Vinie Sill, NP Palliative Medicine Team Pager # [phone] (M-F 8a-5p) Team Phone # [phone] (Nights/Weekends)

## 2020-03-04 NOTE — Progress Notes (Signed)
PROGRESS NOTE  Alice Wong YQM:[id number] DOB: January 02, 1935 DOA: 03/01/2020 PCP: Cassandria Anger, MD   LOS: 3 days   Brief Narrative / Interim history: 86 year old female with history of depression, dementia, currently living at [place] came into the hospital by EMS due to poor responsiveness.  She is nonverbal at baseline but usually more awake.  She recently underwent right hip surgery on 1/27 per orthopedic surgery Dr. Mardelle Matte.  Apparently she was febrile at the facility with 101.  She was found to be significantly hyponatremic with sodium 158, have acute kidney injury creatinine 1.8.  Subjective / 24h Interval events: No significant overnight events.  Remains nonverbal.  Assessment & Plan: Principal Problem Acute metabolic encephalopathy -Likely multifactorial in the setting of a UTI, hypernatremia, poor p.o. intake -Continue supportive treatment with IV fluids, antibiotics, decrease dose today, if she is eating more consistently dilutional switch antibiotics to oral R  Active Problems Acute kidney injury -Creatinine 1.8 on admission, likely prerenal due to poor p.o. intake, creatinine now in the normal range  UTI, sepsis ruled out -Large leukocytes on UA, bacteria present, started on ceftriaxone, continue, she is growing E. coli resistant to ampicillin and Augmentin  Dementia -nonverbal but apparently alert, eating, walking with assistance.  Speech cleared her for dysphagia 2 diet with thick liquids, PT evaluation pending  Right hip fracture -Status post ORIF on 1/27 Dr. Mardelle Matte.  PT consult pending  Hypernatremia -Continue D5W, sodium remains normal   Scheduled Meds: . enoxaparin (LOVENOX) injection  40 mg Subcutaneous Q24H  . hydrALAZINE  10 mg Oral Q8H   Continuous Infusions: . cefTRIAXone (ROCEPHIN)  IV Stopped (03/03/20 1905)  . dextrose 5 % with KCl 20 mEq / L 20 mEq (03/04/20 0837)   PRN Meds:.acetaminophen (TYLENOL) oral liquid 160 mg/5 mL,  hydrALAZINE, ondansetron **OR** ondansetron (ZOFRAN) IV  Diet Orders (From admission, onward)    Start     Ordered   03/02/20 1003  Diet clear liquid Room service appropriate? Yes; Fluid consistency: Thin  Diet effective now       Question Answer Comment  Room service appropriate? Yes   Fluid consistency: Thin      03/02/20 1002          DVT prophylaxis: enoxaparin (LOVENOX) injection 40 mg Start: 03/04/20 1600     Code Status: DNR  Family Communication: Daughter at bedside  Status is: Inpatient  Remains inpatient appropriate because:Inpatient level of care appropriate due to severity of illness   Dispo: The patient is from: SNF              Anticipated d/c is to: SNF              Anticipated d/c date is: 2 days              Patient currently is not medically stable to d/c.   Difficult to place patient No  Level of care: Med-Surg  Consultants:  None   Procedures:  None   Microbiology  Urine cultures - pending  Antimicrobials: Ceftriaxone 2/16    Objective: Vitals:   03/03/20 2042 03/04/20 0529 03/04/20 0716 03/04/20 0721  BP: 124/79 102/85 114/60 114/60  Pulse: 73 70 79 79  Resp: 20 18    Temp: (!) 97.5 F (36.4 C) 98 F (36.7 C)    TempSrc: Axillary Oral    SpO2: 95% 90% 100% 100%  Weight:        Intake/Output Summary (Last 24 hours) at 03/04/2020  [place] filed at 03/03/2020 2244 Gross per 24 hour  Intake 407.91 ml  Output 100 ml  Net 307.91 ml   Filed Weights   03/02/20 0500  Weight: 50.4 kg    Examination:  Constitutional: No apparent distress Eyes: No scleral icterus ENMT: mmm Neck: normal, supple Respiratory: Lungs are clear, no wheezing, no crackles Cardiovascular: Regular rate and rhythm, no murmurs, no peripheral edema Abdomen: Soft, NT, ND, bowel sounds positive Musculoskeletal: no clubbing / cyanosis.  Skin: No rashes appreciated Neurologic: Does not follow commands but moves all 4 extremities spontaneously   Data  Reviewed: I have independently reviewed following labs and imaging studies   CBC: Recent Labs  Lab 03/01/20 0946 03/02/20 0427 03/03/20 0431  WBC 7.2 8.5 6.9  NEUTROABS 6.3  --   --   HGB 14.0 11.3* 10.8*  HCT 42.9 34.5* 33.6*  MCV 100.0 100.9* 98.8  PLT 433* 244 161   Basic Metabolic Panel: Recent Labs  Lab 03/01/20 0946 03/02/20 0427 03/03/20 0431 03/04/20 0212  NA 158* 152* 145 145  K 3.5 3.6 3.9 3.8  CL 122* 122* 114* 115*  CO2 22 18* 20* 14*  GLUCOSE 210* 256* 210* 182*  BUN 74* 65* 31* 19  CREATININE 1.81* 1.38* 0.89 0.78  CALCIUM 9.5 8.7* 8.7* 9.0   Liver Function Tests: Recent Labs  Lab 03/01/20 0946  AST 18  ALT 21  ALKPHOS 116  BILITOT 1.3*  PROT 7.2  ALBUMIN 3.8   Coagulation Profile: No results for input(s): INR, PROTIME in the last 168 hours. HbA1C: No results for input(s): HGBA1C in the last 72 hours. CBG: Recent Labs  Lab 03/02/20 0319  GLUCAP 208*    Recent Results (from the past 240 hour(s))  Urine culture     Status: Abnormal   Collection Time: 03/01/20  9:46 AM   Specimen: Urine, Random  Result Value Ref Range Status   Specimen Description URINE, RANDOM  Final   Special Requests   Final    NONE Performed at [facility], [place] [address] [place], [place] [address]    Culture >=100,000 COLONIES/mL ESCHERICHIA COLI (A)  Final   Report Status 03/04/2020 FINAL  Final   Organism ID, Bacteria ESCHERICHIA COLI (A)  Final      Susceptibility   Escherichia coli - MIC*    AMPICILLIN >=32 RESISTANT Resistant     CEFAZOLIN <=4 SENSITIVE Sensitive     CEFEPIME <=0.12 SENSITIVE Sensitive     CEFTRIAXONE <=0.25 SENSITIVE Sensitive     CIPROFLOXACIN <=0.25 SENSITIVE Sensitive     GENTAMICIN <=1 SENSITIVE Sensitive     IMIPENEM <=0.25 SENSITIVE Sensitive     NITROFURANTOIN <=16 SENSITIVE Sensitive     TRIMETH/SULFA <=20 SENSITIVE Sensitive     AMPICILLIN/SULBACTAM >=32 RESISTANT Resistant     PIP/TAZO <=4 SENSITIVE Sensitive     *  >=100,000 COLONIES/mL ESCHERICHIA COLI  Blood culture (routine x 2)     Status: None (Preliminary result)   Collection Time: 03/01/20  9:58 AM   Specimen: BLOOD RIGHT FOREARM  Result Value Ref Range Status   Specimen Description BLOOD RIGHT FOREARM  Final   Special Requests   Final    BOTTLES DRAWN AEROBIC AND ANAEROBIC Blood Culture adequate volume   Culture   Final    NO GROWTH 3 DAYS Performed at [facility], [address] [place], [place] [address]    Report Status PENDING  Incomplete  Blood culture (routine x 2)  Status: None (Preliminary result)   Collection Time: 03/01/20 10:26 AM   Specimen: BLOOD  Result Value Ref Range Status   Specimen Description BLOOD SITE NOT SPECIFIED  Final   Special Requests   Final    BOTTLES DRAWN AEROBIC AND ANAEROBIC Blood Culture adequate volume   Culture   Final    NO GROWTH 3 DAYS Performed at [facility], [address], [place], [place] [address]    Report Status PENDING  Incomplete  Resp Panel by RT-PCR (Flu A&B, Covid) Nasopharyngeal Swab     Status: None   Collection Time: 03/01/20 10:35 AM   Specimen: Nasopharyngeal Swab; Nasopharyngeal(NP) swabs in vial transport medium  Result Value Ref Range Status   SARS Coronavirus 2 by RT PCR NEGATIVE NEGATIVE Final    Comment: (NOTE) SARS-CoV-2 target nucleic acids are NOT DETECTED.  The SARS-CoV-2 RNA is generally detectable in upper respiratory specimens during the acute phase of infection. The lowest concentration of SARS-CoV-2 viral copies this assay can detect is 138 copies/mL. A negative result does not preclude SARS-Cov-2 infection and should not be used as the sole basis for treatment or other patient management decisions. A negative result may occur with  improper specimen collection/handling, submission of specimen other than nasopharyngeal swab, presence of viral mutation(s) within the areas targeted by this assay, and inadequate number of viral copies(<138  copies/mL). A negative result must be combined with clinical observations, patient history, and epidemiological information. The expected result is Negative.  Fact Sheet for Patients:  EntrepreneurPulse.com.au  Fact Sheet for Healthcare Providers:  IncredibleEmployment.be  This test is no t yet approved or cleared by the [place] FDA and  has been authorized for detection and/or diagnosis of SARS-CoV-2 by FDA under an Emergency Use Authorization (EUA). This EUA will remain  in effect (meaning this test can be used) for the duration of the COVID-19 declaration under Section 564(b)(1) of the Act, 21 U.S.C.section 360bbb-3(b)(1), unless the authorization is terminated  or revoked sooner.       Influenza A by PCR NEGATIVE NEGATIVE Final   Influenza B by PCR NEGATIVE NEGATIVE Final    Comment: (NOTE) The Xpert Xpress SARS-CoV-2/FLU/RSV plus assay is intended as an aid in the diagnosis of influenza from Nasopharyngeal swab specimens and should not be used as a sole basis for treatment. Nasal washings and aspirates are unacceptable for Xpert Xpress SARS-CoV-2/FLU/RSV testing.  Fact Sheet for Patients: EntrepreneurPulse.com.au  Fact Sheet for Healthcare Providers: IncredibleEmployment.be  This test is not yet approved or cleared by the [place] FDA and has been authorized for detection and/or diagnosis of SARS-CoV-2 by FDA under an Emergency Use Authorization (EUA). This EUA will remain in effect (meaning this test can be used) for the duration of the COVID-19 declaration under Section 564(b)(1) of the Act, 21 U.S.C. section 360bbb-3(b)(1), unless the authorization is terminated or revoked.  Performed at [facility], [place] [address], [place], [place] [address]      Radiology Studies: DG Swallowing Func-Speech Pathology  Result Date: 03/03/2020 Objective Swallowing Evaluation: Type of  Study: MBS-Modified Barium Swallow Study  Patient Details Name: DEBRIA BROECKER MRN: [id number] Date of Birth: 18-Jul-1934 Today's Date: 03/03/2020 Time: SLP Start Time (ACUTE ONLY): 1240 -SLP Stop Time (ACUTE ONLY): 1257 SLP Time Calculation (min) (ACUTE ONLY): 17 min Past Medical History: Past Medical History: Diagnosis Date . Anxiety  . Cholelithiasis  . CVA (cerebral vascular accident) ([place])  . Dementia ([place])  . Depression  .  Diverticulosis  . GERD (gastroesophageal reflux disease)  . Gout 2009 . H. pylori infection 2011 . Hyperlipidemia  . Hypertension  . Memory loss  . MR (mitral regurgitation)  . Osteoporosis  . S/P cardiac cath 12/20/13 12/20/2013 . Thrombocytopenia ([place])  . Thyroid cyst  . Vaso-vagal reaction  . Vitamin D deficiency  Past Surgical History: Past Surgical History: Procedure Laterality Date . CESAREAN SECTION   . CHOLECYSTECTOMY  2010 . INTRAMEDULLARY (IM) NAIL INTERTROCHANTERIC Right 02/10/2020  Procedure: INTRAMEDULLARY (IM) NAIL INTERTROCHANTRIC;  Surgeon: Marchia Bond, MD;  Location: [facility] ORS;  Service: Orthopedics;  Laterality: Right; . LASIK   . LEFT HEART CATHETERIZATION WITH CORONARY ANGIOGRAM N/A 12/20/2013  Procedure: LEFT HEART CATHETERIZATION WITH CORONARY ANGIOGRAM;  Surgeon: Burnell Blanks, MD;  Location: [facility] CATH LAB;  Service: Cardiovascular;  Laterality: N/A; HPI: Pt is an 86 y.o. year-old with hx of HTN, HL, H Pyloir, gout, depression, dementia/ memory loss, CVA, anxiety who was brought to the ED from the from [facility], [place] secondary to confusion. Pt is nonverbal at baseline. CT head was negative. CXR 2/15 was negative.  No data recorded Assessment / Plan / Recommendation CHL IP CLINICAL IMPRESSIONS 03/03/2020 Clinical Impression [employer] [place] interpreter, Fromberg (ID# [phone]) was used for translation during the study. Pt presented with oropharyngeal dysphagia characterized by reduced bolus awareness, impaired posterior bolus propulsion, impaired bolus  cohesion, a pharyngeal delay. She demonstrated prolonged oral transit, difficulty with A-P transport with lingual pumping, and premature spillage to the valleculae with spillover to the pyriform sinuses. Oral holding and oral transit delays were most significant with dysphagia 1 solids, but notably more functional with solids which required mastication. She inconsistently demonstrated penetration (PAS 5) aspiration (PAS 7) of thin liquids secondary to premature spillage to the pyriform sinuses and a pharyngeal delay. Coughing was noted following aspiration and was partially effective. A dysphagia 2 diet with nectar thick liquids is recommended at this time. Considering pt's reported functional swallow at baseline, pt's dysphagia is likely at least partly cognitively based. SLP will follow to assess improvement as mentation improves further. SLP Visit Diagnosis Dysphagia, oropharyngeal phase (R13.12) Attention and concentration deficit following -- Frontal lobe and executive function deficit following -- Impact on safety and function Moderate aspiration risk   CHL IP TREATMENT RECOMMENDATION 03/03/2020 Treatment Recommendations Therapy as outlined in treatment plan below   Prognosis 03/03/2020 Prognosis for Safe Diet Advancement Fair Barriers to Reach Goals Cognitive deficits Barriers/Prognosis Comment -- CHL IP DIET RECOMMENDATION 03/03/2020 SLP Diet Recommendations Dysphagia 2 (Fine chop) solids;Nectar thick liquid Liquid Administration via Cup;Straw Medication Administration Crushed with puree Compensations Slow rate;Small sips/bites Postural Changes Remain semi-upright after after feeds/meals (Comment)   No flowsheet data found.  CHL IP FOLLOW UP RECOMMENDATIONS 03/03/2020 Follow up Recommendations Skilled Nursing facility   [facility] IP FREQUENCY AND DURATION 03/03/2020 Speech Therapy Frequency (ACUTE ONLY) min 2x/week Treatment Duration 2 weeks      CHL IP ORAL PHASE 03/03/2020 Oral Phase Impaired Oral - Pudding Teaspoon  -- Oral - Pudding Cup -- Oral - Honey Teaspoon -- Oral - Honey Cup -- Oral - Nectar Teaspoon -- Oral - Nectar Cup Lingual pumping;Delayed oral transit;Decreased bolus cohesion;Premature spillage Oral - Nectar Straw Lingual pumping;Delayed oral transit;Decreased bolus cohesion;Premature spillage Oral - Thin Teaspoon -- Oral - Thin Cup Lingual pumping;Delayed oral transit;Decreased bolus cohesion;Premature spillage Oral - Thin Straw Lingual pumping;Delayed oral transit;Decreased bolus cohesion;Premature spillage Oral - Puree Lingual pumping;Delayed oral transit;Decreased bolus cohesion;Premature spillage Oral - Mech Soft  Lingual pumping;Delayed oral transit;Decreased bolus cohesion;Premature spillage;Impaired mastication Oral - Regular -- Oral - Multi-Consistency -- Oral - Pill -- Oral Phase - Comment --  CHL IP PHARYNGEAL PHASE 03/03/2020 Pharyngeal Phase Impaired Pharyngeal- Pudding Teaspoon -- Pharyngeal -- Pharyngeal- Pudding Cup -- Pharyngeal -- Pharyngeal- Honey Teaspoon -- Pharyngeal -- Pharyngeal- Honey Cup -- Pharyngeal -- Pharyngeal- Nectar Teaspoon -- Pharyngeal -- Pharyngeal- Nectar Cup Delayed swallow initiation-vallecula Pharyngeal -- Pharyngeal- Nectar Straw Delayed swallow initiation-vallecula Pharyngeal -- Pharyngeal- Thin Teaspoon -- Pharyngeal -- Pharyngeal- Thin Cup Delayed swallow initiation-vallecula;Penetration/Aspiration during swallow;Penetration/Apiration after swallow Pharyngeal Material enters airway, CONTACTS cords and not ejected out Pharyngeal- Thin Straw Delayed swallow initiation-vallecula;Penetration/Aspiration during swallow;Penetration/Apiration after swallow Pharyngeal Material enters airway, passes BELOW cords and not ejected out despite cough attempt by patient Pharyngeal- Puree Delayed swallow initiation-vallecula Pharyngeal -- Pharyngeal- Mechanical Soft Delayed swallow initiation-vallecula Pharyngeal -- Pharyngeal- Regular -- Pharyngeal -- Pharyngeal- Multi-consistency --  Pharyngeal -- Pharyngeal- Pill -- Pharyngeal -- Pharyngeal Comment --  CHL IP CERVICAL ESOPHAGEAL PHASE 03/03/2020 Cervical Esophageal Phase WFL Pudding Teaspoon -- Pudding Cup -- Honey Teaspoon -- Honey Cup -- Nectar Teaspoon -- Nectar Cup -- Nectar Straw -- Thin Teaspoon -- Thin Cup -- Thin Straw -- Puree -- Mechanical Soft -- Regular -- Multi-consistency -- Pill -- Cervical Esophageal Comment -- Shanika I. Hardin Negus, [place], [place] Office number [phone] Pager [phone] Horton Marshall 03/03/2020, 3:10 PM              Marzetta Board, MD, PhD [facility]  Between 7 am - 7 pm I am available, please contact me via Amion or Securechat  Between 7 pm - 7 am I am not available, please contact night coverage MD/APP via Amion

## 2020-03-04 NOTE — Evaluation (Signed)
Physical Therapy Evaluation Patient Details Name: NINOSHKA WAINWRIGHT MRN: [id number] DOB: [birth year] Today's Date: 03/04/2020   History of Present Illness  86 y.o. year-old with hx of HTN, HL, H Pyloir, gout, depression, dementia/ memory loss, CVA, anxiety, underwent R hip surgery on 1/27  who was brought to the ED from the from [facility], [place] secondary to confusion. Pt is nonverbal at baseline. CT head was negative. CXR 2/15 was negative.  Clinical Impression   Pt admitted with above diagnosis. Patient with history of dementia and recent hip fracture. Came to hospital with incr confusion and unclear if at baseline she was able to follow visual or tactile cues. Today she was not able to follow multi-modal cues, however she also did not resist being moved. Feel a trial of PT is warranted given recently ambulatory.  Pt currently with functional limitations due to the deficits listed below (see PT Problem List). Pt will benefit from skilled PT to increase their independence and safety with mobility to allow discharge to the venue listed below.       Follow Up Recommendations SNF;Supervision/Assistance - 24 hour (trial of PT to see if cognition improves and allows pt to follow gestures, tactile cues)    Equipment Recommendations  Other (comment) (TBD at SNF)    Recommendations for Other Services       Precautions / Restrictions Precautions Precautions: Fall Restrictions Weight Bearing Restrictions: No      Mobility  Bed Mobility Overal bed mobility: Needs Assistance Bed Mobility: Rolling;Sidelying to Sit;Sit to Sidelying Rolling: Total assist Sidelying to sit: Total assist;HOB elevated     Sit to sidelying: Max assist General bed mobility comments: pt did not resist movements; only assisted with return to sidelying (lowered herself onto left elbow and initiated lifting legs back up onto bed--required assist to complete)    Transfers                 General transfer  comment: pt unwilling to attempt, returning herself to sidelying  Ambulation/Gait             General Gait Details: pt unwilling to attempt, returning herself to sidelying  Stairs            Wheelchair Mobility    Modified Rankin (Stroke Patients Only)       Balance Overall balance assessment: Needs assistance Sitting-balance support: Bilateral upper extremity supported;Feet unsupported Sitting balance-Leahy Scale: Poor   Postural control: Left lateral lean                                   Pertinent Vitals/Pain Pain Assessment: Faces Faces Pain Scale: Hurts a little bit Pain Location: appears general discomfort throughout; Pain Descriptors / Indicators: Moaning Pain Intervention(s): Limited activity within patient's tolerance;Monitored during session;Repositioned    Home Living Family/patient expects to be discharged to:: Skilled nursing facility                      Prior Function Level of Independence: Needs assistance   Gait / Transfers Assistance Needed: prior to hip fx 1/26 was ambulatory; uncertain of status since fx           Hand Dominance        Extremity/Trunk Assessment   Upper Extremity Assessment Upper Extremity Assessment:  (PROM WFL; strength grossly 2+ to 3/5)    Lower Extremity Assessment Lower Extremity Assessment: RLE deficits/detail;LLE deficits/detail RLE  Deficits / Details: PROM WFL except ankle DF to neutral with knee extended, -15 with knee flexed; strengthgrossly2+to3/5 LLE Deficits / Details: PROM WFL except ankle DF to neutral with knee extended, -15 with knee flexed; strengthgrossly2+to3/5       Communication   Communication: Prefers language other than [place];Other (comment) (confused/dementia)  Cognition Arousal/Alertness: Awake/alert Behavior During Therapy: Flat affect Overall Cognitive Status: Difficult to assess                                 General Comments: moans;  nonverbal per records; spoke [place] previously; not following tactile or visual cues      General Comments      Exercises General Exercises - Lower Extremity Ankle Circles/Pumps: PROM;Both;10 reps (followed by prolonged stretch to DF with knees extended) Heel Slides: PROM;Both;5 reps Hip ABduction/ADduction: PROM;Both;5 reps   Assessment/Plan    PT Assessment Patient needs continued PT services  PT Problem List Decreased strength;Decreased range of motion;Decreased activity tolerance;Decreased balance;Decreased mobility;Decreased cognition;Decreased knowledge of use of DME;Decreased safety awareness;Pain       PT Treatment Interventions DME instruction;Gait training;Functional mobility training;Therapeutic activities;Therapeutic exercise;Balance training;Patient/family education    PT Goals (Current goals can be found in the Care Plan section)  Acute Rehab PT Goals Patient Stated Goal: No goals stated PT Goal Formulation: Patient unable to participate in goal setting Time For Goal Achievement: 03/18/20 Potential to Achieve Goals: Poor    Frequency Min 2X/week   Barriers to discharge Decreased caregiver support pt ?ability to participate and benefit from skilled PT    Co-evaluation               AM-PAC PT "6 Clicks" Mobility  Outcome Measure Help needed turning from your back to your side while in a flat bed without using bedrails?: Total Help needed moving from lying on your back to sitting on the side of a flat bed without using bedrails?: Total Help needed moving to and from a bed to a chair (including a wheelchair)?: Total Help needed standing up from a chair using your arms (e.g., wheelchair or bedside chair)?: Total Help needed to walk in hospital room?: Total Help needed climbing 3-5 steps with a railing? : Total 6 Click Score: 6    End of Session   Activity Tolerance: Patient tolerated treatment well Patient left: in bed;with call bell/phone within  reach;with bed alarm set Nurse Communication: Mobility status;Need for lift equipment PT Visit Diagnosis: Difficulty in walking, not elsewhere classified (R26.2) Pain - part of body:  (general moaning; no specific movements caused)    Time: 1761-6073 PT Time Calculation (min) (ACUTE ONLY): 15 min   Charges:   PT Evaluation $PT Eval Low Complexity: 1 Low           Arby Barrette, PT Pager [phone]   Rexanne Mano 03/04/2020, 11:16 AM

## 2020-03-04 NOTE — Plan of Care (Signed)
  Problem: Education: Goal: Knowledge of General Education information will improve Description: Including pain rating scale, medication(s)/side effects and non-pharmacologic comfort measures 03/04/2020 0543 by Loma Messing, RN Outcome: Not Progressing 03/03/2020 2052 by Loma Messing, RN Outcome: Not Progressing   Problem: Health Behavior/Discharge Planning: Goal: Ability to manage health-related needs will improve 03/04/2020 0543 by Loma Messing, RN Outcome: Not Progressing 03/03/2020 2052 by Karsten Fells D, RN Outcome: Not Progressing   Problem: Clinical Measurements: Goal: Ability to maintain clinical measurements within normal limits will improve 03/04/2020 0543 by Loma Messing, RN Outcome: Not Progressing 03/03/2020 2052 by Loma Messing, RN Outcome: Not Progressing Goal: Will remain free from infection 03/04/2020 0543 by Loma Messing, RN Outcome: Not Progressing 03/03/2020 2052 by Loma Messing, RN Outcome: Not Progressing Goal: Diagnostic test results will improve 03/04/2020 0543 by Loma Messing, RN Outcome: Not Progressing 03/03/2020 2052 by Karsten Fells D, RN Outcome: Not Progressing Goal: Respiratory complications will improve 03/04/2020 0543 by Loma Messing, RN Outcome: Not Progressing 03/03/2020 2052 by Karsten Fells D, RN Outcome: Not Progressing Goal: Cardiovascular complication will be avoided 03/04/2020 0543 by Loma Messing, RN Outcome: Not Progressing 03/03/2020 2052 by Loma Messing, RN Outcome: Not Progressing   Problem: Activity: Goal: Risk for activity intolerance will decrease 03/04/2020 0543 by Loma Messing, RN Outcome: Not Progressing 03/03/2020 2052 by Loma Messing, RN Outcome: Not Progressing   Problem: Nutrition: Goal: Adequate nutrition will be maintained 03/04/2020 0543 by Loma Messing, RN Outcome: Not Progressing 03/03/2020 2052 by Karsten Fells D,  RN Outcome: Not Progressing   Problem: Coping: Goal: Level of anxiety will decrease 03/04/2020 0543 by Loma Messing, RN Outcome: Not Progressing 03/03/2020 2052 by Karsten Fells D, RN Outcome: Not Progressing   Problem: Elimination: Goal: Will not experience complications related to bowel motility 03/04/2020 0543 by Loma Messing, RN Outcome: Not Progressing 03/03/2020 2052 by Karsten Fells D, RN Outcome: Not Progressing Goal: Will not experience complications related to urinary retention 03/04/2020 0543 by Loma Messing, RN Outcome: Not Progressing 03/03/2020 2052 by Loma Messing, RN Outcome: Not Progressing   Problem: Pain Managment: Goal: General experience of comfort will improve 03/04/2020 0543 by Loma Messing, RN Outcome: Not Progressing 03/03/2020 2052 by Loma Messing, RN Outcome: Not Progressing

## 2020-03-05 DIAGNOSIS — N179 Acute kidney failure, unspecified: Secondary | ICD-10-CM | POA: Diagnosis not present

## 2020-03-05 DIAGNOSIS — R4182 Altered mental status, unspecified: Secondary | ICD-10-CM | POA: Diagnosis not present

## 2020-03-05 DIAGNOSIS — F0390 Unspecified dementia without behavioral disturbance: Secondary | ICD-10-CM | POA: Diagnosis not present

## 2020-03-05 LAB — CBC
HCT: 35.3 % — ABNORMAL LOW (ref 36.0–46.0)
Hemoglobin: 11.8 g/dL — ABNORMAL LOW (ref 12.0–15.0)
MCH: 31.6 pg (ref 26.0–34.0)
MCHC: 33.4 g/dL (ref 30.0–36.0)
MCV: 94.4 fL (ref 80.0–100.0)
Platelets: 169 10*3/uL (ref 150–400)
RBC: 3.74 MIL/uL — ABNORMAL LOW (ref 3.87–5.11)
RDW: 14.5 % (ref 11.5–15.5)
WBC: 12 10*3/uL — ABNORMAL HIGH (ref 4.0–10.5)
nRBC: 0 % (ref 0.0–0.2)

## 2020-03-05 LAB — BASIC METABOLIC PANEL
Anion gap: 9 (ref 5–15)
BUN: 16 mg/dL (ref 8–23)
CO2: 20 mmol/L — ABNORMAL LOW (ref 22–32)
Calcium: 8.9 mg/dL (ref 8.9–10.3)
Chloride: 113 mmol/L — ABNORMAL HIGH (ref 98–111)
Creatinine, Ser: 0.77 mg/dL (ref 0.44–1.00)
GFR, Estimated: >60 mL/min (ref >60)
Glucose, Bld: 157 mg/dL — ABNORMAL HIGH (ref 70–99)
Potassium: 3.9 mmol/L (ref 3.5–5.1)
Sodium: 142 mmol/L (ref 135–145)

## 2020-03-05 MED ORDER — RESOURCE THICKENUP CLEAR PO POWD
ORAL | Status: DC | PRN
  Filled 2020-03-05: qty 125

## 2020-03-05 NOTE — Progress Notes (Signed)
PROGRESS NOTE  Alice Wong:[id number] DOB: 1934/07/09 DOA: 03/01/2020 PCP: Cassandria Anger, MD   LOS: 4 days   Brief Narrative / Interim history: 86 year old female with history of depression, dementia, currently living at [facility] came into the hospital by EMS due to poor responsiveness.  She is nonverbal at baseline but usually more awake.  She recently underwent right hip surgery on 1/27 per orthopedic surgery Dr. Mardelle Matte.  Apparently she was febrile at the facility with 101.  She was found to be significantly hyponatremic with sodium 158, have acute kidney injury creatinine 1.8.  Subjective / 24h Interval events: Awake this morning, nonverbal  Assessment & Plan: Principal Problem Acute metabolic encephalopathy -Likely multifactorial in the setting of a UTI, hypernatremia, poor p.o. intake -Continue supportive treatment with IV fluids, treat UTI with antibiotics for total of 5 days, still with poor p.o. intake.  Stop IV fluids today given normalization in her labs and see how she does on p.o. intake only  Active Problems Acute kidney injury -Creatinine 1.8 on admission, likely prerenal due to poor p.o. intake, creatinine normal this morning.  Trial off IV fluids  UTI, sepsis ruled out -Large leukocytes on UA, bacteria present, started on ceftriaxone, continue, she is growing E. coli resistant to ampicillin and Augmentin.  Still with poor p.o. intake will keep on IV antibiotics for total of 5 days  Dementia -nonverbal but apparently alert, eating, walking with assistance.  Speech cleared her for dysphagia 2 diet with thick liquids, PT evaluation recommending SNF  Right hip fracture -Status post ORIF on 1/27 Dr. Mardelle Matte.  PT consult pending  Hypernatremia -Sodium normalized.  Stop D5 today   Scheduled Meds: . enoxaparin (LOVENOX) injection  40 mg Subcutaneous Q24H  . hydrALAZINE  10 mg Oral Q8H   Continuous Infusions: . cefTRIAXone (ROCEPHIN)  IV 1 g  (03/04/20 1517)  . dextrose 5 % with KCl 20 mEq / L 20 mEq (03/05/20 0406)   PRN Meds:.acetaminophen (TYLENOL) oral liquid 160 mg/5 mL, hydrALAZINE, ondansetron **OR** ondansetron (ZOFRAN) IV  Diet Orders (From admission, onward)    Start     Ordered   03/04/20 1541  DIET DYS 2 Room service appropriate? Yes; Fluid consistency: Nectar Thick  Diet effective now       Question Answer Comment  Room service appropriate? Yes   Fluid consistency: Nectar Thick      03/04/20 1540          DVT prophylaxis: enoxaparin (LOVENOX) injection 40 mg Start: 03/04/20 1600     Code Status: DNR  Family Communication: Daughter at bedside  Status is: Inpatient  Remains inpatient appropriate because:Inpatient level of care appropriate due to severity of illness   Dispo: The patient is from: SNF              Anticipated d/c is to: SNF              Anticipated d/c date is: 1 day              Patient currently is not medically stable to d/c.   Difficult to place patient No  Level of care: Med-Surg  Consultants:  None   Procedures:  None   Microbiology  Urine cultures - pending  Antimicrobials: Ceftriaxone 2/16    Objective: Vitals:   03/04/20 0721 03/04/20 1443 03/05/20 0115 03/05/20 0500  BP: 114/60 122/76 136/86   Pulse: 79 75 87   Resp:  16 16   Temp:   (!)  97 F (36.1 C)   TempSrc:      SpO2: 100% 100% 94%   Weight:    50.4 kg    Intake/Output Summary (Last 24 hours) at 03/05/2020 0834 Last data filed at 03/05/2020 0545 Gross per 24 hour  Intake 1757.76 ml  Output 350 ml  Net 1407.76 ml   Filed Weights   03/02/20 0500 03/05/20 0500  Weight: 50.4 kg 50.4 kg    Examination:  Constitutional: No distress Eyes: No icterus ENMT: Moist external drains Neck: normal, supple Respiratory: No wheezing or crackles heard Cardiovascular: Regular rate and rhythm, no murmurs Abdomen: Soft, nontender, nondistended, bowel sounds positive Musculoskeletal: no clubbing /  cyanosis.  Skin: No rashes seen Neurologic: Does not follow commands but moves all 4 extremities spontaneously, no new focal deficits   Data Reviewed: I have independently reviewed following labs and imaging studies   CBC: Recent Labs  Lab 03/01/20 0946 03/02/20 0427 03/03/20 0431 03/05/20 0345  WBC 7.2 8.5 6.9 12.0*  NEUTROABS 6.3  --   --   --   HGB 14.0 11.3* 10.8* 11.8*  HCT 42.9 34.5* 33.6* 35.3*  MCV 100.0 100.9* 98.8 94.4  PLT 433* 244 170 094   Basic Metabolic Panel: Recent Labs  Lab 03/01/20 0946 03/02/20 0427 03/03/20 0431 03/04/20 0212 03/05/20 0345  NA 158* 152* 145 145 142  K 3.5 3.6 3.9 3.8 3.9  CL 122* 122* 114* 115* 113*  CO2 22 18* 20* 14* 20*  GLUCOSE 210* 256* 210* 182* 157*  BUN 74* 65* 31* 19 16  CREATININE 1.81* 1.38* 0.89 0.78 0.77  CALCIUM 9.5 8.7* 8.7* 9.0 8.9   Liver Function Tests: Recent Labs  Lab 03/01/20 0946  AST 18  ALT 21  ALKPHOS 116  BILITOT 1.3*  PROT 7.2  ALBUMIN 3.8   Coagulation Profile: No results for input(s): INR, PROTIME in the last 168 hours. HbA1C: No results for input(s): HGBA1C in the last 72 hours. CBG: Recent Labs  Lab 03/02/20 0319  GLUCAP 208*    Recent Results (from the past 240 hour(s))  Urine culture     Status: Abnormal   Collection Time: 03/01/20  9:46 AM   Specimen: Urine, Random  Result Value Ref Range Status   Specimen Description URINE, RANDOM  Final   Special Requests   Final    NONE Performed at [facility], [place] [address] [place], [place] [address]    Culture >=100,000 COLONIES/mL ESCHERICHIA COLI (A)  Final   Report Status 03/04/2020 FINAL  Final   Organism ID, Bacteria ESCHERICHIA COLI (A)  Final      Susceptibility   Escherichia coli - MIC*    AMPICILLIN >=32 RESISTANT Resistant     CEFAZOLIN <=4 SENSITIVE Sensitive     CEFEPIME <=0.12 SENSITIVE Sensitive     CEFTRIAXONE <=0.25 SENSITIVE Sensitive     CIPROFLOXACIN <=0.25 SENSITIVE Sensitive     GENTAMICIN <=1  SENSITIVE Sensitive     IMIPENEM <=0.25 SENSITIVE Sensitive     NITROFURANTOIN <=16 SENSITIVE Sensitive     TRIMETH/SULFA <=20 SENSITIVE Sensitive     AMPICILLIN/SULBACTAM >=32 RESISTANT Resistant     PIP/TAZO <=4 SENSITIVE Sensitive     * >=100,000 COLONIES/mL ESCHERICHIA COLI  Blood culture (routine x 2)     Status: None (Preliminary result)   Collection Time: 03/01/20  9:58 AM   Specimen: BLOOD RIGHT FOREARM  Result Value Ref Range Status   Specimen Description BLOOD RIGHT FOREARM  Final  Special Requests   Final    BOTTLES DRAWN AEROBIC AND ANAEROBIC Blood Culture adequate volume   Culture   Final    NO GROWTH 3 DAYS Performed at [facility], [place] [address] [place], [place] [address]    Report Status PENDING  Incomplete  Blood culture (routine x 2)     Status: None (Preliminary result)   Collection Time: 03/01/20 10:26 AM   Specimen: BLOOD  Result Value Ref Range Status   Specimen Description BLOOD SITE NOT SPECIFIED  Final   Special Requests   Final    BOTTLES DRAWN AEROBIC AND ANAEROBIC Blood Culture adequate volume   Culture   Final    NO GROWTH 3 DAYS Performed at [facility], [address], [place], [place] [address]    Report Status PENDING  Incomplete  Resp Panel by RT-PCR (Flu A&B, Covid) Nasopharyngeal Swab     Status: None   Collection Time: 03/01/20 10:35 AM   Specimen: Nasopharyngeal Swab; Nasopharyngeal(NP) swabs in vial transport medium  Result Value Ref Range Status   SARS Coronavirus 2 by RT PCR NEGATIVE NEGATIVE Final    Comment: (NOTE) SARS-CoV-2 target nucleic acids are NOT DETECTED.  The SARS-CoV-2 RNA is generally detectable in upper respiratory specimens during the acute phase of infection. The lowest concentration of SARS-CoV-2 viral copies this assay can detect is 138 copies/mL. A negative result does not preclude SARS-Cov-2 infection and should not be used as the sole basis for treatment or other patient management decisions. A  negative result may occur with  improper specimen collection/handling, submission of specimen other than nasopharyngeal swab, presence of viral mutation(s) within the areas targeted by this assay, and inadequate number of viral copies(<138 copies/mL). A negative result must be combined with clinical observations, patient history, and epidemiological information. The expected result is Negative.  Fact Sheet for Patients:  EntrepreneurPulse.com.au  Fact Sheet for Healthcare Providers:  IncredibleEmployment.be  This test is no t yet approved or cleared by the [place] FDA and  has been authorized for detection and/or diagnosis of SARS-CoV-2 by FDA under an Emergency Use Authorization (EUA). This EUA will remain  in effect (meaning this test can be used) for the duration of the COVID-19 declaration under Section 564(b)(1) of the Act, 21 U.S.C.section 360bbb-3(b)(1), unless the authorization is terminated  or revoked sooner.       Influenza A by PCR NEGATIVE NEGATIVE Final   Influenza B by PCR NEGATIVE NEGATIVE Final    Comment: (NOTE) The Xpert Xpress SARS-CoV-2/FLU/RSV plus assay is intended as an aid in the diagnosis of influenza from Nasopharyngeal swab specimens and should not be used as a sole basis for treatment. Nasal washings and aspirates are unacceptable for Xpert Xpress SARS-CoV-2/FLU/RSV testing.  Fact Sheet for Patients: EntrepreneurPulse.com.au  Fact Sheet for Healthcare Providers: IncredibleEmployment.be  This test is not yet approved or cleared by the [place] FDA and has been authorized for detection and/or diagnosis of SARS-CoV-2 by FDA under an Emergency Use Authorization (EUA). This EUA will remain in effect (meaning this test can be used) for the duration of the COVID-19 declaration under Section 564(b)(1) of the Act, 21 U.S.C. section 360bbb-3(b)(1), unless the authorization  is terminated or revoked.  Performed at [facility], [place] [address], [place], [place] [address]      Radiology Studies: No results found. Marzetta Board, MD, PhD [facility]  Between 7 am - 7 pm I am available, please contact me via  Amion or Securechat  Between 7 pm - 7 am I am not available, please contact night coverage MD/APP via Amion

## 2020-03-05 NOTE — Plan of Care (Signed)
  Problem: Education: Goal: Knowledge of General Education information will improve Description: Including pain rating scale, medication(s)/side effects and non-pharmacologic comfort measures Outcome: Progressing   Problem: Nutrition: Goal: Adequate nutrition will be maintained Outcome: Progressing   Problem: Activity: Goal: Risk for activity intolerance will decrease Outcome: Progressing   

## 2020-03-05 NOTE — Progress Notes (Signed)
  Speech Language Pathology Treatment: Dysphagia  Patient Details Name: Alice Wong MRN: [id number] DOB: 20-Nov-1934 Today's Date: 03/05/2020 Time: 8466-5993 SLP Time Calculation (min) (ACUTE ONLY): 13 min  Assessment / Plan / Recommendation Clinical Impression  Pt was seen for dysphagia treatment. She was alert and cooperative during the session. Pt exhibited difficulty sucking from a straw today despite verbal and tactile cues. She exhibited coughing with the initial bolus of nectar thick liquids via tsp, but tolerated subsequent boluses via tsp and cup without overt s/sx of aspiration. Oral holding continues to persist with dysphagia 1 solids and mastication was functional with dysphagia 2 solids without significant oral residue. A dysphagia 2 diet with nectar thick liquids is recommended and SLP will continue to follow pt pending decision regarding [place].    HPI HPI: Pt is an 86 y.o. year-old with hx of HTN, HL, [place], gout, depression, dementia/ memory loss, CVA, anxiety who was brought to the ED from the from [facility], [place] secondary to confusion. Pt is nonverbal at baseline. CT head was negative. CXR 2/16 was negative. Palliative care has been consulted with plan for DNR, no artificial feeding, and follow up on 2/21 for further discussion with the pt's daughter.      SLP Plan  Continue with current plan of care       Recommendations  Diet recommendations: Dysphagia 2 (fine chop);Nectar-thick liquid Liquids provided via: Cup;Straw Medication Administration: Crushed with puree Supervision: Trained caregiver to feed patient;Full supervision/cueing for compensatory strategies Compensations: Slow rate;Small sips/bites;Follow solids with liquid Postural Changes and/or Swallow Maneuvers: Seated upright 90 degrees                Oral Care Recommendations: Oral care BID Follow up Recommendations:  (TBD) SLP Visit Diagnosis: Dysphagia, unspecified (R13.10) Plan: Continue with  current plan of care       Marckus Hanover I. [place] Negus, [place], [place] Office number [phone] Pager [place] 03/05/2020, 12:54 PM

## 2020-03-06 DIAGNOSIS — F0390 Unspecified dementia without behavioral disturbance: Secondary | ICD-10-CM | POA: Diagnosis not present

## 2020-03-06 DIAGNOSIS — N179 Acute kidney failure, unspecified: Secondary | ICD-10-CM | POA: Diagnosis not present

## 2020-03-06 DIAGNOSIS — R4182 Altered mental status, unspecified: Secondary | ICD-10-CM | POA: Diagnosis not present

## 2020-03-06 DIAGNOSIS — Z7189 Other specified counseling: Secondary | ICD-10-CM | POA: Diagnosis not present

## 2020-03-06 DIAGNOSIS — Z515 Encounter for palliative care: Secondary | ICD-10-CM | POA: Diagnosis not present

## 2020-03-06 LAB — CULTURE, BLOOD (ROUTINE X 2)
Culture: NO GROWTH
Culture: NO GROWTH
Special Requests: ADEQUATE
Special Requests: ADEQUATE

## 2020-03-06 LAB — BASIC METABOLIC PANEL
Anion gap: 10 (ref 5–15)
BUN: 19 mg/dL (ref 8–23)
CO2: 21 mmol/L — ABNORMAL LOW (ref 22–32)
Calcium: 9 mg/dL (ref 8.9–10.3)
Chloride: 112 mmol/L — ABNORMAL HIGH (ref 98–111)
Creatinine, Ser: 0.75 mg/dL (ref 0.44–1.00)
GFR, Estimated: >60 mL/min (ref >60)
Glucose, Bld: 146 mg/dL — ABNORMAL HIGH (ref 70–99)
Potassium: 3.8 mmol/L (ref 3.5–5.1)
Sodium: 143 mmol/L (ref 135–145)

## 2020-03-06 LAB — SARS CORONAVIRUS 2 (TAT 6-24 HRS): SARS Coronavirus 2: NEGATIVE

## 2020-03-06 NOTE — Care Management Important Message (Signed)
Important Message  Patient Details  Name: Alice Wong MRN: [id number] Date of Birth: March 09, 1934   Medicare Important Message Given:  Yes     Orbie Pyo 03/06/2020, 4:01 PM

## 2020-03-06 NOTE — Progress Notes (Signed)
PROGRESS NOTE  Alice Wong XFG:[id number] DOB: [birth year] DOA: 03/01/2020 PCP: Cassandria Anger, MD   LOS: 5 days   Brief Narrative / Interim history: 86 year old female with history of depression, dementia, currently living at [facility] came into the hospital by EMS due to poor responsiveness.  She is nonverbal at baseline but usually more awake.  She recently underwent right hip surgery on 1/27 per orthopedic surgery Dr. Mardelle Matte.  Apparently she was febrile at the facility with 101.  She was found to be significantly hyponatremic with sodium 158, have acute kidney injury creatinine 1.8.  Subjective / 24h Interval events: Awake, non verbal   Assessment & Plan: Principal Problem Acute metabolic encephalopathy -Likely multifactorial in the setting of a UTI, hypernatremia, poor p.o. intake -completed 5 days of Ceftriaxone. Mental status at baseline  Active Problems Acute kidney injury -Creatinine 1.8 on admission, likely prerenal due to poor p.o. intake, creatinine normalized with IVF, remains normal after IVF were stopped also   UTI, sepsis ruled out -Large leukocytes on UA, bacteria present, started on ceftriaxone, continue, she is growing E. coli resistant to ampicillin and Augmentin. Completed 5 days   Dementia -nonverbal but apparently alert, eating, walking with assistance.  Speech cleared her for dysphagia 2 diet with thick liquids, PT evaluation recommending SNF. Placement pending tomorrow, Covid test today -GOC discussions ongoing, palliative will meet daughter today at 38. Likely needs a MOST form   Right hip fracture -Status post ORIF on 1/27 Dr. Mardelle Matte. SNF tomorrow   Hypernatremia -Sodium normalized and remaining normal off IVF   Scheduled Meds: . enoxaparin (LOVENOX) injection  40 mg Subcutaneous Q24H  . hydrALAZINE  10 mg Oral Q8H   Continuous Infusions:  PRN Meds:.acetaminophen (TYLENOL) oral liquid 160 mg/5 mL, hydrALAZINE, ondansetron **OR**  ondansetron (ZOFRAN) IV, Resource ThickenUp Clear  Diet Orders (From admission, onward)    Start     Ordered   03/04/20 1541  DIET DYS 2 Room service appropriate? Yes; Fluid consistency: Nectar Thick  Diet effective now       Question Answer Comment  Room service appropriate? Yes   Fluid consistency: Nectar Thick      03/04/20 1540          DVT prophylaxis: enoxaparin (LOVENOX) injection 40 mg Start: 03/04/20 1600     Code Status: DNR  Family Communication: Daughter at bedside  Status is: Inpatient  Remains inpatient appropriate because:Inpatient level of care appropriate due to severity of illness  Dispo: The patient is from: SNF              Anticipated d/c is to: SNF              Anticipated d/c date is: 1 day              Patient currently is not medically stable to d/c.   Difficult to place patient No  Level of care: Med-Surg  Consultants:  None   Procedures:  None   Microbiology  Urine cultures - pending  Antimicrobials: Ceftriaxone 2/16 >> 2/21   Objective: Vitals:   03/05/20 1536 03/05/20 2037 03/06/20 0552 03/06/20 1003  BP: (!) 131/91 135/81 (!) 142/85 (!) 171/102  Pulse: 82 73 79 97  Resp: 16 18 16 20   Temp:  97.6 F (36.4 C) 97.6 F (36.4 C) 98.7 F (37.1 C)  TempSrc:      SpO2: 97% 98% 97% 98%  Weight:       No intake or output  data in the 24 hours ending 03/06/20 1101 Filed Weights   03/02/20 0500 03/05/20 0500  Weight: 50.4 kg 50.4 kg    Examination:  Constitutional: NAD Eyes: no icterus  ENMT: mmm Neck: normal, supple Respiratory: no wheezing, no crackles Cardiovascular: rrr, no mrg Abdomen: soft, nt, nd, bs+ Musculoskeletal: no clubbing / cyanosis.  Skin: no rashes Neurologic: Does not follow commands but moves all 4 extremities spontaneously, no new focal deficits    Data Reviewed: I have independently reviewed following labs and imaging studies   CBC: Recent Labs  Lab 03/01/20 0946 03/02/20 0427 03/03/20 0431  03/05/20 0345  WBC 7.2 8.5 6.9 12.0*  NEUTROABS 6.3  --   --   --   HGB 14.0 11.3* 10.8* 11.8*  HCT 42.9 34.5* 33.6* 35.3*  MCV 100.0 100.9* 98.8 94.4  PLT 433* 244 170 626   Basic Metabolic Panel: Recent Labs  Lab 03/02/20 0427 03/03/20 0431 03/04/20 0212 03/05/20 0345 03/06/20 0359  NA 152* 145 145 142 143  K 3.6 3.9 3.8 3.9 3.8  CL 122* 114* 115* 113* 112*  CO2 18* 20* 14* 20* 21*  GLUCOSE 256* 210* 182* 157* 146*  BUN 65* 31* 19 16 19   CREATININE 1.38* 0.89 0.78 0.77 0.75  CALCIUM 8.7* 8.7* 9.0 8.9 9.0   Liver Function Tests: Recent Labs  Lab 03/01/20 0946  AST 18  ALT 21  ALKPHOS 116  BILITOT 1.3*  PROT 7.2  ALBUMIN 3.8   Coagulation Profile: No results for input(s): INR, PROTIME in the last 168 hours. HbA1C: No results for input(s): HGBA1C in the last 72 hours. CBG: Recent Labs  Lab 03/02/20 0319  GLUCAP 208*    Recent Results (from the past 240 hour(s))  Urine culture     Status: Abnormal   Collection Time: 03/01/20  9:46 AM   Specimen: Urine, Random  Result Value Ref Range Status   Specimen Description URINE, RANDOM  Final   Special Requests   Final    NONE Performed at [facility], [place] [address] [place], [place] [address]    Culture >=100,000 COLONIES/mL ESCHERICHIA COLI (A)  Final   Report Status 03/04/2020 FINAL  Final   Organism ID, Bacteria ESCHERICHIA COLI (A)  Final      Susceptibility   Escherichia coli - MIC*    AMPICILLIN >=32 RESISTANT Resistant     CEFAZOLIN <=4 SENSITIVE Sensitive     CEFEPIME <=0.12 SENSITIVE Sensitive     CEFTRIAXONE <=0.25 SENSITIVE Sensitive     CIPROFLOXACIN <=0.25 SENSITIVE Sensitive     GENTAMICIN <=1 SENSITIVE Sensitive     IMIPENEM <=0.25 SENSITIVE Sensitive     NITROFURANTOIN <=16 SENSITIVE Sensitive     TRIMETH/SULFA <=20 SENSITIVE Sensitive     AMPICILLIN/SULBACTAM >=32 RESISTANT Resistant     PIP/TAZO <=4 SENSITIVE Sensitive     * >=100,000 COLONIES/mL ESCHERICHIA COLI  Blood culture  (routine x 2)     Status: None   Collection Time: 03/01/20  9:58 AM   Specimen: BLOOD RIGHT FOREARM  Result Value Ref Range Status   Specimen Description BLOOD RIGHT FOREARM  Final   Special Requests   Final    BOTTLES DRAWN AEROBIC AND ANAEROBIC Blood Culture adequate volume   Culture   Final    NO GROWTH 5 DAYS Performed at [facility], [address], [place],  [address]    Report Status 03/06/2020 FINAL  Final  Blood culture (routine x 2)  Status: None   Collection Time: 03/01/20 10:26 AM   Specimen: BLOOD  Result Value Ref Range Status   Specimen Description BLOOD SITE NOT SPECIFIED  Final   Special Requests   Final    BOTTLES DRAWN AEROBIC AND ANAEROBIC Blood Culture adequate volume   Culture   Final    NO GROWTH 5 DAYS Performed at [facility], [address], [place], [place] [address]    Report Status 03/06/2020 FINAL  Final  Resp Panel by RT-PCR (Flu A&B, Covid) Nasopharyngeal Swab     Status: None   Collection Time: 03/01/20 10:35 AM   Specimen: Nasopharyngeal Swab; Nasopharyngeal(NP) swabs in vial transport medium  Result Value Ref Range Status   SARS Coronavirus 2 by RT PCR NEGATIVE NEGATIVE Final    Comment: (NOTE) SARS-CoV-2 target nucleic acids are NOT DETECTED.  The SARS-CoV-2 RNA is generally detectable in upper respiratory specimens during the acute phase of infection. The lowest concentration of SARS-CoV-2 viral copies this assay can detect is 138 copies/mL. A negative result does not preclude SARS-Cov-2 infection and should not be used as the sole basis for treatment or other patient management decisions. A negative result may occur with  improper specimen collection/handling, submission of specimen other than nasopharyngeal swab, presence of viral mutation(s) within the areas targeted by this assay, and inadequate number of viral copies(<138 copies/mL). A negative result must be combined with clinical observations, patient  history, and epidemiological information. The expected result is Negative.  Fact Sheet for Patients:  EntrepreneurPulse.com.au  Fact Sheet for Healthcare Providers:  IncredibleEmployment.be  This test is no t yet approved or cleared by the [place] FDA and  has been authorized for detection and/or diagnosis of SARS-CoV-2 by FDA under an Emergency Use Authorization (EUA). This EUA will remain  in effect (meaning this test can be used) for the duration of the COVID-19 declaration under Section 564(b)(1) of the Act, 21 U.S.C.section 360bbb-3(b)(1), unless the authorization is terminated  or revoked sooner.       Influenza A by PCR NEGATIVE NEGATIVE Final   Influenza B by PCR NEGATIVE NEGATIVE Final    Comment: (NOTE) The Xpert Xpress SARS-CoV-2/FLU/RSV plus assay is intended as an aid in the diagnosis of influenza from Nasopharyngeal swab specimens and should not be used as a sole basis for treatment. Nasal washings and aspirates are unacceptable for Xpert Xpress SARS-CoV-2/FLU/RSV testing.  Fact Sheet for Patients: EntrepreneurPulse.com.au  Fact Sheet for Healthcare Providers: IncredibleEmployment.be  This test is not yet approved or cleared by the [place] FDA and has been authorized for detection and/or diagnosis of SARS-CoV-2 by FDA under an Emergency Use Authorization (EUA). This EUA will remain in effect (meaning this test can be used) for the duration of the COVID-19 declaration under Section 564(b)(1) of the Act, 21 U.S.C. section 360bbb-3(b)(1), unless the authorization is terminated or revoked.  Performed at [facility], [place] [address], [place], [place] [address]      Radiology Studies: No results found. Marzetta Board, MD, PhD [facility]  Between 7 am - 7 pm I am available, please contact me via Amion or Securechat  Between 7 pm - 7 am I am not available, please  contact night coverage MD/APP via Amion

## 2020-03-07 DIAGNOSIS — F0390 Unspecified dementia without behavioral disturbance: Secondary | ICD-10-CM | POA: Diagnosis not present

## 2020-03-07 DIAGNOSIS — Z515 Encounter for palliative care: Secondary | ICD-10-CM | POA: Diagnosis not present

## 2020-03-07 DIAGNOSIS — R5381 Other malaise: Secondary | ICD-10-CM | POA: Diagnosis not present

## 2020-03-07 DIAGNOSIS — R410 Disorientation, unspecified: Secondary | ICD-10-CM

## 2020-03-07 DIAGNOSIS — N179 Acute kidney failure, unspecified: Secondary | ICD-10-CM | POA: Diagnosis not present

## 2020-03-07 DIAGNOSIS — Z7189 Other specified counseling: Secondary | ICD-10-CM | POA: Diagnosis not present

## 2020-03-07 LAB — BASIC METABOLIC PANEL
Anion gap: 10 (ref 5–15)
BUN: 22 mg/dL (ref 8–23)
CO2: 20 mmol/L — ABNORMAL LOW (ref 22–32)
Calcium: 9 mg/dL (ref 8.9–10.3)
Chloride: 113 mmol/L — ABNORMAL HIGH (ref 98–111)
Creatinine, Ser: 0.82 mg/dL (ref 0.44–1.00)
GFR, Estimated: >60 mL/min (ref >60)
Glucose, Bld: 152 mg/dL — ABNORMAL HIGH (ref 70–99)
Potassium: 3.6 mmol/L (ref 3.5–5.1)
Sodium: 143 mmol/L (ref 135–145)

## 2020-03-07 MED ORDER — MAGNESIUM HYDROXIDE 400 MG/5ML PO SUSP: 5.0000 mL | Freq: Every evening | ORAL | 0 refills | Status: AC | PRN

## 2020-03-07 MED ORDER — LOPERAMIDE HCL 2 MG PO CAPS: 2.0000 mg | ORAL_CAPSULE | ORAL | 0 refills | Status: AC

## 2020-03-07 MED ORDER — ENOXAPARIN SODIUM 40 MG/0.4ML ~~LOC~~ SOLN: 40.0000 mg | SUBCUTANEOUS | 0 refills | Status: AC

## 2020-03-07 MED ORDER — FERROUS SULFATE 325 (65 FE) MG PO TABS: 325.0000 mg | ORAL_TABLET | Freq: Every day | ORAL | 0 refills | Status: AC

## 2020-03-07 MED ORDER — HYDRALAZINE HCL 10 MG PO TABS: 10.0000 mg | ORAL_TABLET | Freq: Three times a day (TID) | ORAL | 0 refills | Status: AC

## 2020-03-07 MED ORDER — MEMANTINE HCL 10 MG PO TABS: 10.0000 mg | ORAL_TABLET | Freq: Two times a day (BID) | ORAL | 0 refills | Status: AC

## 2020-03-07 MED ORDER — TRIPLE ANTIBIOTIC 5-400-5000 EX OINT: 1.0000 "application " | TOPICAL_OINTMENT | Freq: Every day | CUTANEOUS | 0 refills | Status: AC | PRN

## 2020-03-07 MED ORDER — ACETAMINOPHEN 160 MG/5ML PO SOLN: 500.0000 mg | Freq: Four times a day (QID) | ORAL | 0 refills | Status: AC | PRN

## 2020-03-07 MED ORDER — HALOPERIDOL 1 MG PO TABS: 1.0000 mg | ORAL_TABLET | Freq: Three times a day (TID) | ORAL | 0 refills | Status: AC

## 2020-03-07 NOTE — TOC Progression Note (Addendum)
Transition of Care [facility]) - Progression Note    Patient Details  Name: Alice Wong MRN: [id number] Date of Birth: [birth year]  Transition of Care [facility]) CM/SW Contact  Angelita Ingles, RN Phone Number: [phone]  03/07/2020, 9:10 AM  Clinical Narrative:    CM received message that daughter does not want patient to go back to [facility]. CM spoke with daughter and daughter would like for patient to go back to [facility] which is where she was prior to rehab at [facility]. Daughter reports that patient still has bed at [facility]. CM spoke with [place] care manager at [facility] who states that she will call daughter to discuss expectations and will call CM back with a decision on if the facility will be able to accept the patient back.   [facility] will accept the patient back but they will need DME- hospital bed bedside table, wheelchair and written scripts for all meds. This all has to happen before 3pm today otherwise it will have to be tomorrow.Message has been made MD to make him aware.  [place] referral has been called to [facility]. DME to be ordered by hospice. Hospice liaison will reach out to famiily.   1440 DME delivery pending. Patient can not be discharged to facility until all DME has been delivered. TOC will plan on discharge in the am once delivery of DME is verified.        Expected Discharge Plan and Services                                                 Social Determinants of Health (SDOH) Interventions    Readmission Risk Interventions No flowsheet data found.

## 2020-03-07 NOTE — Progress Notes (Signed)
Palliative:  HPI: 86 y.o. female  with past medical history of hypertension, hyperlipidemia, H. Pylori, gout, depression, dementia, stroke, anxiety, R hip surgery 02/10/20 (walking prior to fall and surgery) admitted from [facility] on 03/01/2020 with decreased responsiveness for ~2 days and found to have dehydration and AKI with UTI. Noted to be nonverbal at baseline (speaks only [place]) and does not follow commands. Memory issues for ~4-5 years and living in facility since 2019. SLP has recommended dysphagia 2, nectar liquid diet.   I met today with daughter, Alice Wong, at Alice Wong's bedside. RN reprots that she ate a few bites of her breakfast but really nothing else today. Alice Wong has papers and has been doing her research regarding dementia progression and hospice care. She shares that she understands that this is progression and that her mother's prognosis is poor. She shares that her mother has had excellent care at [facility] and she trusts the staff there to take good care of her mother and they know her well. She is hopeful that ALF will be able to take her mother back with the help of hospice.   We also completed MOST form: DNR, comfort measures (avoid rehospitalization), determine use/limitation of antibiotics, IV fluids for defined trial period, NO feeding tube. Most important to Alice Wong is that her mother has good care and is close to her where she can get to her often. This is why she ideally would like [facility]. She would be open to [facility] (may not be eligible yet) or a different SNF if needed but would like to research and visit first.   All questions/concerns addressed. Emotional support provided.   Exam: Lethargic, no distress. Thin, frail. Confused. At times she moans or cries but then falls asleep. Breathing regular, unlabored. Abd soft, flat.   Plan: - DNR - MOST completed - Hopeful for return to ALF with hospice in place  35 min  Vinie Sill,  NP Palliative Medicine Team Pager [phone] (Please see [email or web address] for schedule) Team Phone [phone]    Greater than 50%  of this time was spent counseling and coordinating care related to the above assessment and plan

## 2020-03-07 NOTE — Progress Notes (Signed)
Palliative:  HPI: 86 y.o.femalewith past medical history of hypertension, hyperlipidemia, H. Pylori, gout, depression, dementia, stroke, anxiety, R hip surgery 02/10/20 (walking prior to fall and surgery)[facility]with decreased responsiveness for ~2 days and found to have dehydration and AKI with UTI.Noted to be nonverbal at baseline (speaks only [place]) and does not follow commands. Memory issues for ~4-5 years and living in facility since 2019. SLP has recommended dysphagia 2, nectar liquid diet.  Alice Wong is much unchanged. She has not eaten much of anything since yesterday morning a little breakfast. RN reports attempt at breakfast but she did not even seem aware of food/drink when placed in mouth.   I discussed with Dr. Renne Crigler and Festus Holts, [facility], regarding hopes for return to ALF with hospice in place for end of life care. If ALF unable to meet needs then [facility] would be our next option. Daughter, Alice Wong, has good relationship with staff of ALF and trusts that they will take good care of her mother. ALF is also very close to [place] where she can more easily visit with her mother multiple times a day.   Notified by [facility] that ALF will be able to accept [name] back to facility with hospice in place. She may discharge when equipment arrives to ALF.   Exam: Lethargic, no distress. Thin, frail. Confused. At times she moans or cries but then falls asleep. Breathing regular, unlabored. Abd soft, flat.   Plan: - DNR and MOST completed and on shadow chart.  - Return to ALF with hospice in place.  - Prognosis poor and likely only weeks or less.   15 min  Vinie Sill, NP Palliative Medicine Team Pager [phone] (Please see [email or web address] for schedule) Team Phone [phone]    Greater than 50%  of this time was spent counseling and coordinating care related to the above assessment and plan

## 2020-03-07 NOTE — Progress Notes (Addendum)
Manufacturing engineer [facility])  Addendum: Spoke with daughter [name] who  verbalized understanding of hospice services.    Received request from [facility] for hospice services at home after discharge.  Chart and pt information under review by [facility] physician.  Hospice eligibility pending at this time.  Hospital liaison called daughter [name] to initiate education related to hospice philosophy and services and to answer any questions at this time, but was unable to reach her.  [facility] compliant voicemail left with contact information. Liaison will call again this afternoon.   [place] send signed and completed DNR home with pt/family.  Please provide prescriptions at discharge as needed to ensure ongoing symptom management until pt can be admitted onto hospice services.    DME needs discussed with TOC.  Hospital bed, overbed table, and wheelchair have been ordered for delivery today.  Address has been verified and is correct in the chart.  [facility] information and contact numbers given to [facility] in voicemail.  Above information shared with Arma Heading Manager.  Please call with any questions or concerns.  Thank you for the opportunity to participate in this pt's care.  Domenic Moras, BSN, RN [facility] [phone] (24h on call)

## 2020-03-07 NOTE — Discharge Summary (Signed)
Physician Discharge Summary  Alice Wong MBW:[id number] DOB: March 25, 1934 DOA: 03/01/2020  PCP: Cassandria Anger, MD  Admit date: 03/01/2020 Discharge date: 03/07/2020  Admitted From: SNF Disposition:  ALF with hospice  Recommendations for Outpatient Follow-up:  1. Follow up with hospice services  Discharge Condition: stable CODE STATUS: DNR Diet recommendation: comfort feeding  HPI: Per admitting MD, The patient is a 86 y.o. year-old w/ hx of HTN, HL, H Pyloir, gout, depression, dementia/ memory loss, h/o CVA, anxiety who lives at [facility], brought to ED today from the SNF by EMS. Pt was poor responsive for period of time possibly the last 2 days. Speaks [place]. Per the ED notes dtr stated pt is nonverbal at baseline but more awake yesterday than now. She just underwent R hip surgery on 1/27 per ortho Dr Mardelle Matte. Temp at facility was 101 deg, BP 135/ 80, RR 30. In ED labs showed na 158, creat 1.81 (b/l creat 0.8 from Feb 12 2020). WBC 7K and Hb 14 (was 7.8 on 02/12/20, 18d ago). COVID and flu neg today, blood/ urine cx's sent. T 99.4, BP 140/80. CXR clear. CT head shows atrophy and L max sinusitis. Asked to see for admission.  Pt seen in room, daughter [name] at bedside.  Pt not responding, eyes closed, does not follow commands or respond to voice. Per the DTR patient has had memory issues for 4-5 yrs and had to go into a facility in 2019.  She does interact w/ the dtr but does not recognize her I believe.  She was ambulatory prior to the fall that led to the recent R hip surgery, per the dtr.  Pt is DNR per the dtr, as prior.   Hospital Course / Discharge diagnoses: Principal Problem Acute metabolic encephalopathy -Likely multifactorial in the setting of a UTI, hypernatremia, poor p.o. intake. Completed 5 days of Ceftriaxone. Mental status at baseline  Active Problems Acute kidney injury -Creatinine 1.8 on admission, likely prerenal due to poor p.o. intake, creatinine  normalized with IVF, remains normal after IVF were stopped UTI -Large leukocytes on UA, bacteria present, started on ceftriaxone, continue, she is growing E. coli resistant to ampicillin and Augmentin. Completed 5 days  Dementia -nonverbal but apparently alert, eating, walking with assistance. Speech cleared her for dysphagia 2 diet with thick liquids. Overall declining, palliative consulted and she will be discharged under ALF with hospice care.  Right hip fracture -Status post ORIF on 1/27 Dr. Mardelle Matte. 7 more days of post op Lovenox Hypernatremia -Sodium normalized and remaining normal off IVF  Sepsis ruled out   Discharge Instructions   Allergies as of 03/07/2020      Reactions   Aricept [donepezil Hcl] Other (See Comments)   Per MAR   Coreg [carvedilol] Other (See Comments)   Per MAR   Morphine Sulfate Other (See Comments)   REACTION: decreased blood pressure      Medication List    STOP taking these medications   acetaminophen 500 MG tablet Commonly known as: TYLENOL Replaced by: acetaminophen 160 MG/5ML solution   diclofenac Sodium 1 % Gel Commonly known as: VOLTAREN   nitroGLYCERIN 0.4 MG SL tablet Commonly known as: NITROSTAT   traMADol 50 MG tablet Commonly known as: ULTRAM     TAKE these medications   acetaminophen 160 MG/5ML solution Commonly known as: TYLENOL Take 15.6 mLs (500 mg total) by mouth every 6 (six) hours as needed for moderate pain or fever. Replaces: acetaminophen 500 MG tablet  enoxaparin 40 MG/0.4ML injection Commonly known as: LOVENOX Inject 0.4 mLs (40 mg total) into the skin daily for 7 days.   ferrous sulfate 325 (65 FE) MG tablet Take 1 tablet (325 mg total) by mouth daily with breakfast.   haloperidol 1 MG tablet Commonly known as: HALDOL Take 1 tablet (1 mg total) by mouth 3 (three) times daily.   hydrALAZINE 10 MG tablet Commonly known as: APRESOLINE Take 1 tablet (10 mg total) by mouth every 8 (eight) hours.   loperamide 2  MG capsule Commonly known as: IMODIUM Take 1 capsule (2 mg total) by mouth See admin instructions. Gives 1 capsule (2 mg) by mouth every 3 hours as needed for diarrhea. Do not exceed 8 doses in 24 hours   magnesium hydroxide 400 MG/5ML suspension Commonly known as: MILK OF MAGNESIA Take 5 mLs by mouth at bedtime as needed for mild constipation.   memantine 10 MG tablet Commonly known as: NAMENDA Take 1 tablet (10 mg total) by mouth 2 (two) times daily.   neomycin-bacitracin-polymyxin [phone] ointment Apply 1 application topically daily as needed (skin tear).            Durable Medical Equipment  (From admission, onward)         Start     Ordered   03/07/20 1112  For home use only DME standard manual wheelchair with seat cushion  Once       Comments: Patient suffers from dementia which impairs their ability to perform daily activities like bathing, dressing, feeding, grooming, and toileting in the home.  A cane, crutch, or walker will not resolve issue with performing activities of daily living. A wheelchair will allow patient to safely perform daily activities. Patient can safely propel the wheelchair in the home or has a caregiver who can provide assistance. Length of need Lifetime. Accessories: elevating leg rests (ELRs), wheel locks, extensions and anti-tippers.   03/07/20 1111   03/07/20 1111  For home use only DME Hospital bed  Once       Question Answer Comment  Length of Need Lifetime   The above medical condition requires: Patient requires the ability to reposition frequently   Bed type Semi-electric      03/07/20 1111   03/07/20 1111  For home use only DME Overbed table  Once        03/07/20 1111         Consultations:  Palliative care   Procedures/Studies:  None   DG Chest 1 View  Result Date: 02/15/2020 CLINICAL DATA:  86 year old female with fall. EXAM: CHEST  1 VIEW COMPARISON:  Chest radiograph dated 02/09/2020. FINDINGS: Faint reticulonodular  densities in the left upper lobe have slightly progressed compared to prior radiograph and may represent atypical infection. Clinical correlation is recommended. Trace left pleural effusion suspected. No pneumothorax. Stable cardiac silhouette. Atherosclerotic calcification of the aorta. No acute osseous pathology. IMPRESSION: Slight progression of the left upper lobe reticulonodular densities compared to prior radiograph and may represent atypical infection. Clinical correlation and follow-up recommended. Electronically Signed   By: Anner Crete M.D.   On: 02/15/2020 18:10   DG Chest 1 View  Result Date: 02/09/2020 CLINICAL DATA:  Fall EXAM: CHEST  1 VIEW COMPARISON:  12/04/2017, 02/21/2017 FINDINGS: No acute airspace disease or effusion. Stable cardiomediastinal silhouette with borderline cardiomegaly. Suspected skin fold artifact over the left chest. Asymmetrical opacity at the left lung apex. Ovoid nodularity in the right mid lung. IMPRESSION: 1. No acute airspace disease. 2.  Asymmetrical opacity at the left lung apex, probably due to first rib end and bony summation though appears different compared to prior. Additional small nodular opacity in the right mid lung. Recommend chest CT for further evaluation. Electronically Signed   By: Donavan Foil M.D.   On: 02/09/2020 16:44   CT Head Wo Contrast  Result Date: 03/01/2020 CLINICAL DATA:  Altered mental status EXAM: CT HEAD WITHOUT CONTRAST TECHNIQUE: Contiguous axial images were obtained from the base of the skull through the vertex without intravenous contrast. COMPARISON:  None. FINDINGS: Brain: There is atrophy and chronic small vessel disease changes. No acute intracranial abnormality. Specifically, no hemorrhage, hydrocephalus, mass lesion, acute infarction, or significant intracranial injury. Vascular: No hyperdense vessel or unexpected calcification. Skull: No acute calvarial abnormality. Sinuses/Orbits: Mucosal thickening in the left  maxillary sinus. Other: None IMPRESSION: Atrophy, chronic microvascular disease. No acute intracranial abnormality. Chronic left maxillary sinusitis. Electronically Signed   By: Rolm Baptise M.D.   On: 03/01/2020 12:02   CT Head Wo Contrast  Result Date: 02/15/2020 CLINICAL DATA:  Fall from bed, facial trauma EXAM: CT HEAD WITHOUT CONTRAST TECHNIQUE: Contiguous axial images were obtained from the base of the skull through the vertex without intravenous contrast. COMPARISON:  02/09/2020 FINDINGS: Brain: There is no acute intracranial hemorrhage, mass effect, or edema. Gray-white differentiation is preserved. There is no extra-axial fluid collection. Prominence of the ventricles and sulci reflects stable parenchymal volume loss. Patchy and confluent hypoattenuation in the supratentorial white matter likely reflects stable chronic microvascular ischemic changes. Vascular: There is atherosclerotic calcification at the skull base. Skull: Calvarium is unremarkable. Sinuses/Orbits: Left maxillary sinus dominant mucosal thickening. No significant orbital finding. Other: Right frontal scalp soft tissue swelling. Soft tissue nodule within the subcutaneous tissues superficial to the right maxillary sinus at the site of prior hematoma. Presumably reflects remaining hematoma. IMPRESSION: No evidence of acute intracranial injury. Electronically Signed   By: Macy Mis M.D.   On: 02/15/2020 18:39   CT Head Wo Contrast  Result Date: 02/09/2020 CLINICAL DATA:  Facial trauma fall hip fracture EXAM: CT HEAD WITHOUT CONTRAST CT CERVICAL SPINE WITHOUT CONTRAST TECHNIQUE: Multidetector CT imaging of the head and cervical spine was performed following the standard protocol without intravenous contrast. Multiplanar CT image reconstructions of the cervical spine were also generated. COMPARISON:  CT brain and cervical spine 01/25/2020 FINDINGS: CT HEAD FINDINGS Brain: No acute territorial infarction, hemorrhage or intracranial  mass. Moderate atrophy. Mild hypodensity in the white matter consistent with chronic small vessel ischemic change. Stable ventricle size Vascular: No hyperdense vessels. Vertebral and carotid vascular calcification Skull: Normal. Negative for fracture or focal lesion. Sinuses/Orbits: Hyperdense secretions in the left maxillary sinus. Mucosal thickening in the sinuses Other: Small hyperdense mass in the subcutaneous soft tissues of the right cheek consistent with hematoma, decreased compared to prior CT CERVICAL SPINE FINDINGS Alignment: Trace retrolisthesis C3 on C4, stable. Facet alignment is maintained. Skull base and vertebrae: No acute fracture. No primary bone lesion or focal pathologic process. Soft tissues and spinal canal: No prevertebral fluid or swelling. No visible canal hematoma. Disc levels: Multiple level degenerative change, moderate disc space narrowing at C3-C4 and C6-C7 with moderate advanced disease at C5-C6. Facet degenerative changes at multiple levels. Upper chest: Negative. Other: None IMPRESSION: 1. No CT evidence for acute intracranial abnormality. Atrophy and mild chronic small vessel ischemic change of the white matter. 2. Degenerative changes of the cervical spine. No acute osseous abnormality. Electronically Signed   By: Maudie Mercury  Francoise Ceo M.D.   On: 02/09/2020 16:54   CT Cervical Spine Wo Contrast  Result Date: 02/15/2020 CLINICAL DATA:  Fall EXAM: CT CERVICAL SPINE WITHOUT CONTRAST TECHNIQUE: Multidetector CT imaging of the cervical spine was performed without intravenous contrast. Multiplanar CT image reconstructions were also generated. COMPARISON:  02/09/2020 FINDINGS: Alignment: Stable Skull base and vertebrae: No acute cervical spine fracture. Stable vertebral body heights. Soft tissues and spinal canal: No prevertebral fluid or swelling. No visible canal hematoma. Disc levels: Degenerative changes are stable over the short interval. Upper chest: No new finding. Other: No new  finding. IMPRESSION: No acute cervical spine fracture. Electronically Signed   By: Macy Mis M.D.   On: 02/15/2020 18:43   CT Cervical Spine Wo Contrast  Result Date: 02/09/2020 CLINICAL DATA:  Facial trauma fall hip fracture EXAM: CT HEAD WITHOUT CONTRAST CT CERVICAL SPINE WITHOUT CONTRAST TECHNIQUE: Multidetector CT imaging of the head and cervical spine was performed following the standard protocol without intravenous contrast. Multiplanar CT image reconstructions of the cervical spine were also generated. COMPARISON:  CT brain and cervical spine 01/25/2020 FINDINGS: CT HEAD FINDINGS Brain: No acute territorial infarction, hemorrhage or intracranial mass. Moderate atrophy. Mild hypodensity in the white matter consistent with chronic small vessel ischemic change. Stable ventricle size Vascular: No hyperdense vessels. Vertebral and carotid vascular calcification Skull: Normal. Negative for fracture or focal lesion. Sinuses/Orbits: Hyperdense secretions in the left maxillary sinus. Mucosal thickening in the sinuses Other: Small hyperdense mass in the subcutaneous soft tissues of the right cheek consistent with hematoma, decreased compared to prior CT CERVICAL SPINE FINDINGS Alignment: Trace retrolisthesis C3 on C4, stable. Facet alignment is maintained. Skull base and vertebrae: No acute fracture. No primary bone lesion or focal pathologic process. Soft tissues and spinal canal: No prevertebral fluid or swelling. No visible canal hematoma. Disc levels: Multiple level degenerative change, moderate disc space narrowing at C3-C4 and C6-C7 with moderate advanced disease at C5-C6. Facet degenerative changes at multiple levels. Upper chest: Negative. Other: None IMPRESSION: 1. No CT evidence for acute intracranial abnormality. Atrophy and mild chronic small vessel ischemic change of the white matter. 2. Degenerative changes of the cervical spine. No acute osseous abnormality. Electronically Signed   By: Donavan Foil M.D.   On: 02/09/2020 16:54   Pelvis Portable  Result Date: 02/10/2020 CLINICAL DATA:  Postop EXAM: PORTABLE PELVIS 1-2 VIEWS COMPARISON:  02/09/2020 FINDINGS: Interval intramedullary rod and distal screw fixation of right femur for intertrochanteric fracture. Gas in the soft tissues consistent with recent surgery. Possible remote fracture deformity of left superior pubic ramus though partially obscured by stool and bowel gas. IMPRESSION: Interval fixation of right intertrochanteric fracture. Electronically Signed   By: Donavan Foil M.D.   On: 02/10/2020 18:16   DG Chest Portable 1 View  Result Date: 03/01/2020 CLINICAL DATA:  Altered mental status. EXAM: PORTABLE CHEST 1 VIEW COMPARISON:  February 15, 2020. FINDINGS: The heart size and mediastinal contours are within normal limits. Both lungs are clear. The visualized skeletal structures are unremarkable. IMPRESSION: No active disease. Electronically Signed   By: Marijo Conception M.D.   On: 03/01/2020 10:21   DG Swallowing Func-Speech Pathology  Result Date: 03/03/2020 Objective Swallowing Evaluation: Type of Study: MBS-Modified Barium Swallow Study  Patient Details Name: PRIYA MATSEN MRN: [id number] Date of Birth: 04-23-1934 Today's Date: 03/03/2020 Time: SLP Start Time (ACUTE ONLY): 1240 -SLP Stop Time (ACUTE ONLY): 1257 SLP Time Calculation (min) (ACUTE ONLY): 17 min Past Medical History: Past  Medical History: Diagnosis Date . Anxiety  . Cholelithiasis  . CVA (cerebral vascular accident) ([place])  . Dementia ([place])  . Depression  . Diverticulosis  . GERD (gastroesophageal reflux disease)  . Gout 2009 . H. pylori infection 2011 . Hyperlipidemia  . Hypertension  . Memory loss  . MR (mitral regurgitation)  . Osteoporosis  . S/P cardiac cath 12/20/13 12/20/2013 . Thrombocytopenia ([place])  . Thyroid cyst  . Vaso-vagal reaction  . Vitamin D deficiency  Past Surgical History: Past Surgical History: Procedure Laterality Date . CESAREAN SECTION   .  CHOLECYSTECTOMY  2010 . INTRAMEDULLARY (IM) NAIL INTERTROCHANTERIC Right 02/10/2020  Procedure: INTRAMEDULLARY (IM) NAIL INTERTROCHANTRIC;  Surgeon: Marchia Bond, MD;  Location: [facility] ORS;  Service: Orthopedics;  Laterality: Right; . LASIK   . LEFT HEART CATHETERIZATION WITH CORONARY ANGIOGRAM N/A 12/20/2013  Procedure: LEFT HEART CATHETERIZATION WITH CORONARY ANGIOGRAM;  Surgeon: Burnell Blanks, MD;  Location: [facility] CATH LAB;  Service: Cardiovascular;  Laterality: N/A; HPI: Pt is an 86 y.o. year-old with hx of HTN, HL, H Pyloir, gout, depression, dementia/ memory loss, CVA, anxiety who was brought to the ED from the from [facility], [place] secondary to confusion. Pt is nonverbal at baseline. CT head was negative. CXR 2/15 was negative.  No data recorded Assessment / Plan / Recommendation CHL IP CLINICAL IMPRESSIONS 03/03/2020 Clinical Impression [employer] [place] interpreter, [place] (ID# [phone]) was used for translation during the study. Pt presented with oropharyngeal dysphagia characterized by reduced bolus awareness, impaired posterior bolus propulsion, impaired bolus cohesion, a pharyngeal delay. She demonstrated prolonged oral transit, difficulty with A-P transport with lingual pumping, and premature spillage to the valleculae with spillover to the pyriform sinuses. Oral holding and oral transit delays were most significant with dysphagia 1 solids, but notably more functional with solids which required mastication. She inconsistently demonstrated penetration (PAS 5) aspiration (PAS 7) of thin liquids secondary to premature spillage to the pyriform sinuses and a pharyngeal delay. Coughing was noted following aspiration and was partially effective. A dysphagia 2 diet with nectar thick liquids is recommended at this time. Considering pt's reported functional swallow at baseline, pt's dysphagia is likely at least partly cognitively based. SLP will follow to assess improvement as mentation improves  further. SLP Visit Diagnosis Dysphagia, oropharyngeal phase (R13.12) Attention and concentration deficit following -- Frontal lobe and executive function deficit following -- Impact on safety and function Moderate aspiration risk   CHL IP TREATMENT RECOMMENDATION 03/03/2020 Treatment Recommendations Therapy as outlined in treatment plan below   Prognosis 03/03/2020 Prognosis for Safe Diet Advancement Fair Barriers to Reach Goals Cognitive deficits Barriers/Prognosis Comment -- CHL IP DIET RECOMMENDATION 03/03/2020 SLP Diet Recommendations Dysphagia 2 (Fine chop) solids;Nectar thick liquid Liquid Administration via Cup;Straw Medication Administration Crushed with puree Compensations Slow rate;Small sips/bites Postural Changes Remain semi-upright after after feeds/meals (Comment)   No flowsheet data found.  CHL IP FOLLOW UP RECOMMENDATIONS 03/03/2020 Follow up Recommendations Skilled Nursing facility   [facility] IP FREQUENCY AND DURATION 03/03/2020 Speech Therapy Frequency (ACUTE ONLY) min 2x/week Treatment Duration 2 weeks      CHL IP ORAL PHASE 03/03/2020 Oral Phase Impaired Oral - Pudding Teaspoon -- Oral - Pudding Cup -- Oral - Honey Teaspoon -- Oral - Honey Cup -- Oral - Nectar Teaspoon -- Oral - Nectar Cup Lingual pumping;Delayed oral transit;Decreased bolus cohesion;Premature spillage Oral - Nectar Straw Lingual pumping;Delayed oral transit;Decreased bolus cohesion;Premature spillage Oral - Thin Teaspoon -- Oral - Thin Cup Lingual pumping;Delayed oral transit;Decreased bolus cohesion;Premature spillage  Oral - Thin Straw Lingual pumping;Delayed oral transit;Decreased bolus cohesion;Premature spillage Oral - Puree Lingual pumping;Delayed oral transit;Decreased bolus cohesion;Premature spillage Oral - Mech Soft Lingual pumping;Delayed oral transit;Decreased bolus cohesion;Premature spillage;Impaired mastication Oral - Regular -- Oral - Multi-Consistency -- Oral - Pill -- Oral Phase - Comment --  CHL IP PHARYNGEAL PHASE  03/03/2020 Pharyngeal Phase Impaired Pharyngeal- Pudding Teaspoon -- Pharyngeal -- Pharyngeal- Pudding Cup -- Pharyngeal -- Pharyngeal- Honey Teaspoon -- Pharyngeal -- Pharyngeal- Honey Cup -- Pharyngeal -- Pharyngeal- Nectar Teaspoon -- Pharyngeal -- Pharyngeal- Nectar Cup Delayed swallow initiation-vallecula Pharyngeal -- Pharyngeal- Nectar Straw Delayed swallow initiation-vallecula Pharyngeal -- Pharyngeal- Thin Teaspoon -- Pharyngeal -- Pharyngeal- Thin Cup Delayed swallow initiation-vallecula;Penetration/Aspiration during swallow;Penetration/Apiration after swallow Pharyngeal Material enters airway, CONTACTS cords and not ejected out Pharyngeal- Thin Straw Delayed swallow initiation-vallecula;Penetration/Aspiration during swallow;Penetration/Apiration after swallow Pharyngeal Material enters airway, passes BELOW cords and not ejected out despite cough attempt by patient Pharyngeal- Puree Delayed swallow initiation-vallecula Pharyngeal -- Pharyngeal- Mechanical Soft Delayed swallow initiation-vallecula Pharyngeal -- Pharyngeal- Regular -- Pharyngeal -- Pharyngeal- Multi-consistency -- Pharyngeal -- Pharyngeal- Pill -- Pharyngeal -- Pharyngeal Comment --  CHL IP CERVICAL ESOPHAGEAL PHASE 03/03/2020 Cervical Esophageal Phase WFL Pudding Teaspoon -- Pudding Cup -- Honey Teaspoon -- Honey Cup -- Nectar Teaspoon -- Nectar Cup -- Nectar Straw -- Thin Teaspoon -- Thin Cup -- Thin Straw -- Puree -- Mechanical Soft -- Regular -- Multi-consistency -- Pill -- Cervical Esophageal Comment -- Alice Wong, [place], [place] Office number [phone] Pager [place] 03/03/2020, 3:10 PM              DG C-Arm 1-60 Min-No Report  Result Date: 02/10/2020 Fluoroscopy was utilized by the requesting physician.  No radiographic interpretation.   DG Hip Port Unilat With Pelvis 1V Right  Result Date: 02/10/2020 CLINICAL DATA:  Postop EXAM: DG HIP (WITH OR WITHOUT PELVIS) 1V PORT  RIGHT COMPARISON:  02/09/2020 FINDINGS: Cross-table lateral view of the right hip shows internal fixation of right intertrochanteric fracture. IMPRESSION: Interval internal fixation of right intertrochanteric fracture. Electronically Signed   By: Donavan Foil M.D.   On: 02/10/2020 18:15   DG HIP OPERATIVE UNILAT W OR W/O PELVIS RIGHT  Result Date: 02/10/2020 CLINICAL DATA:  86 year old female with ORIF of the right femur. EXAM: OPERATIVE right HIP (WITH PELVIS IF PERFORMED) 2 VIEWS TECHNIQUE: Fluoroscopic spot image(s) were submitted for interpretation post-operatively. COMPARISON:  Right hand radiograph dated 02/09/2020. FINDINGS: Six intraoperative fluoroscopic images provided. The total fluoroscopic time is 42 seconds. There has been interval placement of intramedullary rod and cervical screw. IMPRESSION: Intraoperative fluoroscopic images of the right hip. Electronically Signed   By: Anner Crete M.D.   On: 02/10/2020 17:20   DG Hip Unilat W or Wo Pelvis 2-3 Views Right  Result Date: 02/15/2020 CLINICAL DATA:  86 year old female with fall. EXAM: DG HIP (WITH OR WITHOUT PELVIS) 2-3V RIGHT COMPARISON:  Chest radiograph dated 02/10/2020. FINDINGS: Right femoral fixation hardware appears intact. There is no acute fracture or dislocation. The bones are osteopenic. The soft tissues are unremarkable. IMPRESSION: No acute fracture or dislocation. Electronically Signed   By: Anner Crete M.D.   On: 02/15/2020 18:11   DG Hip Unilat With Pelvis 2-3 Views Right  Result Date: 02/09/2020 CLINICAL DATA:  Right hip bruising, history of falls EXAM: DG HIP (WITH OR WITHOUT PELVIS) 2-3V RIGHT COMPARISON:  None. FINDINGS: Frontal view of the pelvis as well as frontal and cross-table lateral views of the right hip  are obtained. There is a comminuted intertrochanteric right hip fracture with impaction and varus angulation at the fracture site. No dislocation. No additional bony abnormalities. Left hip is  unremarkable. IMPRESSION: 1. Comminuted intertrochanteric right hip fracture with impaction and varus angulation. Electronically Signed   By: Randa Ngo M.D.   On: 02/09/2020 15:18     Subjective: - no chest pain, shortness of breath, no abdominal pain, nausea or vomiting.   Discharge Exam: BP (!) 157/129   Pulse 89   Temp 98.7 F (37.1 C)   Resp 18   Wt 50.4 kg   SpO2 100%   BMI 19.07 kg/m   General: Pt is alert, awake, not in acute distress Cardiovascular: RRR, S1/S2 +, no rubs, no gallops Respiratory: CTA bilaterally, no wheezing, no rhonchi Abdominal: Soft, NT, ND, bowel sounds + Extremities: no edema, no cyanosis   The results of significant diagnostics from this hospitalization (including imaging, microbiology, ancillary and laboratory) are listed below for reference.     Microbiology: Recent Results (from the past 240 hour(s))  Urine culture     Status: Abnormal   Collection Time: 03/01/20  9:46 AM   Specimen: Urine, Random  Result Value Ref Range Status   Specimen Description URINE, RANDOM  Final   Special Requests   Final    NONE Performed at [facility], [address] [place], [place] [address]    Culture >=100,000 COLONIES/mL ESCHERICHIA COLI (A)  Final   Report Status 03/04/2020 FINAL  Final   Organism ID, Bacteria ESCHERICHIA COLI (A)  Final      Susceptibility   Escherichia coli - MIC*    AMPICILLIN >=32 RESISTANT Resistant     CEFAZOLIN <=4 SENSITIVE Sensitive     CEFEPIME <=0.12 SENSITIVE Sensitive     CEFTRIAXONE <=0.25 SENSITIVE Sensitive     CIPROFLOXACIN <=0.25 SENSITIVE Sensitive     GENTAMICIN <=1 SENSITIVE Sensitive     IMIPENEM <=0.25 SENSITIVE Sensitive     NITROFURANTOIN <=16 SENSITIVE Sensitive     TRIMETH/SULFA <=20 SENSITIVE Sensitive     AMPICILLIN/SULBACTAM >=32 RESISTANT Resistant     PIP/TAZO <=4 SENSITIVE Sensitive     * >=100,000 COLONIES/mL ESCHERICHIA COLI  Blood culture (routine x 2)     Status: None    Collection Time: 03/01/20  9:58 AM   Specimen: BLOOD RIGHT FOREARM  Result Value Ref Range Status   Specimen Description BLOOD RIGHT FOREARM  Final   Special Requests   Final    BOTTLES DRAWN AEROBIC AND ANAEROBIC Blood Culture adequate volume   Culture   Final    NO GROWTH 5 DAYS Performed at [facility], [address], [place], [place] [address]    Report Status 03/06/2020 FINAL  Final  Blood culture (routine x 2)     Status: None   Collection Time: 03/01/20 10:26 AM   Specimen: BLOOD  Result Value Ref Range Status   Specimen Description BLOOD SITE NOT SPECIFIED  Final   Special Requests   Final    BOTTLES DRAWN AEROBIC AND ANAEROBIC Blood Culture adequate volume   Culture   Final    NO GROWTH 5 DAYS Performed at [facility], [place] [address], [place], [place] [address]    Report Status 03/06/2020 FINAL  Final  Resp Panel by RT-PCR (Flu A&B, Covid) Nasopharyngeal Swab     Status: None   Collection Time: 03/01/20 10:35 AM   Specimen: Nasopharyngeal Swab; Nasopharyngeal(NP) swabs in vial transport  medium  Result Value Ref Range Status   SARS Coronavirus 2 by RT PCR NEGATIVE NEGATIVE Final    Comment: (NOTE) SARS-CoV-2 target nucleic acids are NOT DETECTED.  The SARS-CoV-2 RNA is generally detectable in upper respiratory specimens during the acute phase of infection. The lowest concentration of SARS-CoV-2 viral copies this assay can detect is 138 copies/mL. A negative result does not preclude SARS-Cov-2 infection and should not be used as the sole basis for treatment or other patient management decisions. A negative result may occur with  improper specimen collection/handling, submission of specimen other than nasopharyngeal swab, presence of viral mutation(s) within the areas targeted by this assay, and inadequate number of viral copies(<138 copies/mL). A negative result must be combined with clinical observations, patient history, and  epidemiological information. The expected result is Negative.  Fact Sheet for Patients:  EntrepreneurPulse.com.au  Fact Sheet for Healthcare Providers:  IncredibleEmployment.be  This test is no t yet approved or cleared by the [place] FDA and  has been authorized for detection and/or diagnosis of SARS-CoV-2 by FDA under an Emergency Use Authorization (EUA). This EUA will remain  in effect (meaning this test can be used) for the duration of the COVID-19 declaration under Section 564(b)(1) of the Act, 21 U.S.C.section 360bbb-3(b)(1), unless the authorization is terminated  or revoked sooner.       Influenza A by PCR NEGATIVE NEGATIVE Final   Influenza B by PCR NEGATIVE NEGATIVE Final    Comment: (NOTE) The Xpert Xpress SARS-CoV-2/FLU/RSV plus assay is intended as an aid in the diagnosis of influenza from Nasopharyngeal swab specimens and should not be used as a sole basis for treatment. Nasal washings and aspirates are unacceptable for Xpert Xpress SARS-CoV-2/FLU/RSV testing.  Fact Sheet for Patients: EntrepreneurPulse.com.au  Fact Sheet for Healthcare Providers: IncredibleEmployment.be  This test is not yet approved or cleared by the [place] FDA and has been authorized for detection and/or diagnosis of SARS-CoV-2 by FDA under an Emergency Use Authorization (EUA). This EUA will remain in effect (meaning this test can be used) for the duration of the COVID-19 declaration under Section 564(b)(1) of the Act, 21 U.S.C. section 360bbb-3(b)(1), unless the authorization is terminated or revoked.  Performed at [facility], [place] [address], [place], [place] [address]   SARS CORONAVIRUS 2 (TAT 6-24 HRS) Nasopharyngeal Nasopharyngeal Swab     Status: None   Collection Time: 03/06/20 10:56 AM   Specimen: Nasopharyngeal Swab  Result Value Ref Range Status   SARS Coronavirus 2 NEGATIVE NEGATIVE  Final    Comment: (NOTE) SARS-CoV-2 target nucleic acids are NOT DETECTED.  The SARS-CoV-2 RNA is generally detectable in upper and lower respiratory specimens during the acute phase of infection. Negative results do not preclude SARS-CoV-2 infection, do not rule out co-infections with other pathogens, and should not be used as the sole basis for treatment or other patient management decisions. Negative results must be combined with clinical observations, patient history, and epidemiological information. The expected result is Negative.  Fact Sheet for Patients: SugarRoll.be  Fact Sheet for Healthcare Providers: https://www.woods-mathews.com/  This test is not yet approved or cleared by the [place] FDA and  has been authorized for detection and/or diagnosis of SARS-CoV-2 by FDA under an Emergency Use Authorization (EUA). This EUA will remain  in effect (meaning this test can be used) for the duration of the COVID-19 declaration under Se ction 564(b)(1) of the Act, 21 U.S.C. section 360bbb-3(b)(1), unless the authorization is terminated or revoked sooner.  Performed at [facility], [place] [address], [place], [place] [address]      Labs: Basic Metabolic Panel: Recent Labs  Lab 03/03/20 0431 03/04/20 0212 03/05/20 0345 03/06/20 0359 03/07/20 0257  NA 145 145 142 143 143  K 3.9 3.8 3.9 3.8 3.6  CL 114* 115* 113* 112* 113*  CO2 20* 14* 20* 21* 20*  GLUCOSE 210* 182* 157* 146* 152*  BUN 31* 19 16 19 22   CREATININE 0.89 0.78 0.77 0.75 0.82  CALCIUM 8.7* 9.0 8.9 9.0 9.0   Liver Function Tests: Recent Labs  Lab 03/01/20 0946  AST 18  ALT 21  ALKPHOS 116  BILITOT 1.3*  PROT 7.2  ALBUMIN 3.8   CBC: Recent Labs  Lab 03/01/20 0946 03/02/20 0427 03/03/20 0431 03/05/20 0345  WBC 7.2 8.5 6.9 12.0*  NEUTROABS 6.3  --   --   --   HGB 14.0 11.3* 10.8* 11.8*  HCT 42.9 34.5* 33.6* 35.3*  MCV 100.0 100.9* 98.8 94.4   PLT 433* 244 170 169   CBG: Recent Labs  Lab 03/02/20 0319  GLUCAP 208*   Hgb A1c No results for input(s): HGBA1C in the last 72 hours. Lipid Profile No results for input(s): CHOL, HDL, LDLCALC, TRIG, CHOLHDL, LDLDIRECT in the last 72 hours. Thyroid function studies No results for input(s): TSH, T4TOTAL, T3FREE, THYROIDAB in the last 72 hours.  Invalid input(s): FREET3 Urinalysis    Component Value Date/Time   COLORURINE AMBER (A) 03/01/2020 0946   APPEARANCEUR HAZY (A) 03/01/2020 0946   LABSPEC 1.025 03/01/2020 0946   PHURINE 5.0 03/01/2020 0946   GLUCOSEU NEGATIVE 03/01/2020 0946   GLUCOSEU NEGATIVE 08/15/2017 1354   HGBUR MODERATE (A) 03/01/2020 0946   BILIRUBINUR NEGATIVE 03/01/2020 0946   KETONESUR NEGATIVE 03/01/2020 0946   PROTEINUR NEGATIVE 03/01/2020 0946   UROBILINOGEN 1.0 08/15/2017 1354   NITRITE NEGATIVE 03/01/2020 0946   LEUKOCYTESUR LARGE (A) 03/01/2020 0946    FURTHER DISCHARGE INSTRUCTIONS:   Get Medicines reviewed and adjusted: Please take all your medications with you for your next visit with your Primary MD   Laboratory/radiological data: Please request your Primary MD to go over all hospital tests and procedure/radiological results at the follow up, please ask your Primary MD to get all Hospital records sent to his/her office.   In some cases, they will be blood work, cultures and biopsy results pending at the time of your discharge. Please request that your primary care M.D. goes through all the records of your hospital data and follows up on these results.   Also Note the following: If you experience worsening of your admission symptoms, develop shortness of breath, life threatening emergency, suicidal or homicidal thoughts you must seek medical attention immediately by calling 911 or calling your MD immediately  if symptoms less severe.   You must read complete instructions/literature along with all the possible adverse reactions/side effects  for all the Medicines you take and that have been prescribed to you. Take any new Medicines after you have completely understood and accpet all the possible adverse reactions/side effects.    Do not drive when taking Pain medications or sleeping medications (Benzodaizepines)   Do not take more than prescribed Pain, Sleep and Anxiety Medications. It is not advisable to combine anxiety,sleep and pain medications without talking with your primary care practitioner   Special Instructions: If you have smoked or chewed Tobacco  in the last 2 yrs please stop smoking, stop any regular Alcohol  and or any  Recreational drug use.   Wear Seat belts while driving.   Please note: You were cared for by a hospitalist during your hospital stay. Once you are discharged, your primary care physician will handle any further medical issues. Please note that NO REFILLS for any discharge medications will be authorized once you are discharged, as it is imperative that you return to your primary care physician (or establish a relationship with a primary care physician if you do not have one) for your post hospital discharge needs so that they can reassess your need for medications and monitor your lab values.  Time coordinating discharge: 40 minutes  SIGNED:  Marzetta Board, MD, PhD 03/07/2020, 11:20 AM

## 2020-03-08 DIAGNOSIS — R4182 Altered mental status, unspecified: Secondary | ICD-10-CM | POA: Diagnosis not present

## 2020-03-08 LAB — CREATININE, SERUM
Creatinine, Ser: 0.82 mg/dL (ref 0.44–1.00)
GFR, Estimated: >60 mL/min (ref >60)

## 2020-03-08 MED ORDER — FENTANYL CITRATE (PF) 100 MCG/2ML IJ SOLN: 6.2500 ug | INTRAMUSCULAR | Status: DC | PRN

## 2020-03-08 MED ORDER — OXYCODONE HCL 20 MG/ML PO CONC
2.5000 mg | Freq: Four times a day (QID) | ORAL | Status: DC | PRN
Start: 2020-03-08 — End: 2020-03-08

## 2020-03-08 MED ORDER — OXYCODONE HCL 20 MG/ML PO CONC
2.5000 mg | Freq: Once | ORAL | Status: AC
  Administered 2020-03-08: 2.6 mg via SUBLINGUAL
  Filled 2020-03-08: qty 1

## 2020-03-08 MED ORDER — FENTANYL CITRATE (PF) 100 MCG/2ML IJ SOLN
12.5000 ug | Freq: Once | INTRAMUSCULAR | Status: AC
  Administered 2020-03-08: 12.5 ug via INTRAVENOUS
  Filled 2020-03-08: qty 2

## 2020-03-08 MED ORDER — FENTANYL CITRATE (PF) 100 MCG/2ML IJ SOLN: 12.5000 ug | INTRAMUSCULAR | Status: DC | PRN

## 2020-03-08 NOTE — Progress Notes (Signed)
Palliative:  Discussed with RN who called with concern of Ms. Volker having grimacing and pain. One time dose of roxicodone SL 2.5 mg ordered for relief. Unable to tolerate tylenol by mouth. Noted hypotension with opioids previously and she has been hypotensive so this should be okay especially with such a small dose. Asked RN to monitor BP and check prior to discharge as well for tolerance. Hospice to manage symptoms after discharge.   No charge  Vinie Sill, NP Palliative Medicine Team Pager [phone] (Please see [email or web address] for schedule) Team Phone [phone]

## 2020-03-08 NOTE — TOC Transition Note (Signed)
Transition of Care [facility]) - CM/SW Discharge Note   Patient Details  Name: Alice Wong MRN: [id number] Date of Birth: 1934/08/11  [facility] CM/SW Contact:  Angelita Ingles, RN Phone Number: [phone]  03/08/2020, 12:09 PM   Clinical Narrative:    CM spoke with Jenny Reichmann at [facility] who confirms all DME has been delivered and patient is good to return to facility. Transport has been set up with [facility]. Attempted to contact daughter [name] with no answer. Message has been left. Bedside nurse has been updated. D/c packet is at the nurses station on patients chart. Copy of updated FL2 hass been included per Cindy's request.   Please call report to  [facility] [phone] Room # 304   Final next level of care: Assisted Living Barriers to Discharge: No Barriers Identified   Patient Goals and CMS Choice   CMS Medicare.gov Compare Post Acute Care list provided to:: Patient Represenative (must comment) (daughter) Choice offered to / list presented to : Adult Children  Discharge Placement              Patient chooses bed at: [facility] Patient to be transferred to facility by: Katie Name of family member notified: [name] daughter (message left) Patient and family notified of of transfer: 03/08/20  Discharge Plan and Services                DME Arranged: N/A DME Agency: NA       HH Arranged: NA HH Agency: [facility]        Social Determinants of Health (SDOH) Interventions     Readmission Risk Interventions No flowsheet data found.

## 2020-03-08 NOTE — Progress Notes (Addendum)
Seen at bedside today prior to d/c. Patient stable for discharge and discharge summary complete yesterday by Dr. Cruzita Lederer.    86 yo hx of HTN, HL, H Pylori, gout, depression, dementia/ memory loss, h/o CVA, anxiety and recent R hip surgery on 1/27.  At baseline nonverbal.  She's s/p 5 days treatment with ceftriaxone for UTI.  Plan is for discharge to ALF with hospice care given general decline.    Vitals:   03/07/20 2041 03/08/20 0346  BP: (!) 170/102 (!) 168/102  Pulse: 88 90  Resp: 16 18  Temp: 98 F (36.7 C) 98 F (36.7 C)  SpO2: 100% 100%   NAD.  Chronically ill appearing. RRR Unlabored breathing No LEE Nonverbal, moving all extremities  See d/c summary for recommendations at ALF.  Recommend hospice to follow at ALF.

## 2020-03-08 NOTE — Plan of Care (Signed)
  Problem: Education: Goal: Knowledge of General Education information will improve Description: Including pain rating scale, medication(s)/side effects and non-pharmacologic comfort measures Outcome: Adequate for Discharge   

## 2020-03-08 NOTE — NC FL2 (Addendum)
[place] LEVEL OF CARE SCREENING TOOL     IDENTIFICATION  Patient Name: Alice Wong Birthdate: [birth year] Sex: female Admission Date (Current Location): 03/01/2020  [facility] and [place] Number:  Herbalist and Address:  [facility], [place] [address], [place], [place] [address]      Provider Number: [id number]  Attending Physician Name and Address:  Elodia Florence., *  Relative Name and Phone Number:  Sherrilee Gilles Daughter [phone]    Current Level of Care: Hospital Recommended Level of Care: [place] Prior Approval Number:    Date Approved/Denied:   PASRR Number:    Discharge Plan: Other (Comment) (Assisted living facility)    Current Diagnoses: Patient Active Problem List   Diagnosis Date Noted  . AKI (acute kidney injury) ([place]) 03/01/2020  . Volume depletion 03/01/2020  . Altered mental status 03/01/2020  . UTI (urinary tract infection) 03/01/2020  . History of repair of hip fracture 03/01/2020  . DNR (do not resuscitate) 03/01/2020  . Debility 03/01/2020  . Sepsis ([place]) 03/01/2020  . Hypernatremia   . Probable sepsis   . Hip fracture ([place]) 02/09/2020  . Agitation 12/09/2017  . Paranoia ([place])   . Incontinence of bowel 08/15/2017  . Chest wall contusion 02/21/2017  . Diarrhea 09/22/2015  . Redness of eye, right 02/15/2015  . Noncompliance with medication regimen 11/15/2014  . Dementia without behavioral disturbance ([place]) 07/28/2014  . Cerebral infarction ([place]) 07/21/2014  . Sternum pain 04/21/2014  . Costochondritis 04/13/2014  . Simple chronic bronchitis ([place]) 04/13/2014  . Cough 03/22/2014  . S/P cardiac cath 12/20/13, non obsteructive disease 12/20/2013  . Abnormal nuclear stress test, false positive per cath   . Vasovagal syncope 12/17/2013  . Midsternal chest pain 12/17/2013  . Neoplasm of uncertain behavior of skin 10/27/2013  . 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 Pain of right thumb 12/20/2011  . Psychosomatic disorder 08/23/2011  . Tinnitus 06/04/2011  . Headache, post-traumatic, chronic 05/01/2011  . LBP (low back pain) 01/30/2011  . Insomnia 11/19/2010  . Actinic keratoses 05/23/2010  . Dizziness 04/09/2010  . Bruising 04/09/2010  . Thrombocytopenia ([place]) 01/02/2010  . HEMORRHOIDS 12/12/2009  . HELICOBACTER PYLORI INFECTION 03/17/2009  . PARESTHESIA 03/17/2009  . Chest pain, unspecified 08/01/2008  . Vitamin D deficiency 03/03/2008  . GAIT IMBALANCE 10/22/2007  . Gout, unspecified 05/07/2007  . FOOT PAIN 05/07/2007  . Dyslipidemia 02/05/2007  . Anxiety state 02/05/2007  . Depression with anxiety 02/05/2007  . Essential hypertension 02/05/2007  . GERD 02/05/2007  . OSTEOPOROSIS 02/05/2007  . Memory loss 02/05/2007  . History of cardiovascular disorder 02/05/2007    Orientation RESPIRATION BLADDER Height & Weight     Self  Normal Incontinent Weight: 50.4 kg Height:     BEHAVIORAL SYMPTOMS/MOOD NEUROLOGICAL BOWEL NUTRITION STATUS     (n/a) Incontinent Diet (Dysphagia nectar thick)  AMBULATORY STATUS COMMUNICATION OF NEEDS Skin   Total Care Verbally Normal (sacral foam prophylatic dressing skin intact under dressing)                       Personal Care Assistance Level of Assistance  Bathing,Feeding,Dressing Bathing Assistance: Maximum assistance Feeding assistance: Limited assistance Dressing Assistance: Maximum assistance     Functional Limitations Info  Sight,Hearing,Speech Sight Info: Adequate Hearing Info: Adequate Speech Info: Adequate    SPECIAL CARE FACTORS FREQUENCY  PT (By licensed PT),OT (By licensed OT)  PT Frequency: 3X this is an active Hospice patient OT Frequency: 3X this is an active hospice patient            Contractures      Additional Factors Info  Code Status,Allergies,Psychotropic,Insulin Sliding Scale,Suctioning Needs Code Status Info:  DNR Allergies Info: Aricept (Donepezil Hcl), Coreg (Carvedilol), Morphine Sulfate Psychotropic Info: n/a Insulin Sliding Scale Info: see d/c summary for sliding scale information   Suctioning Needs: n/a   Current Medications (03/08/2020):   Medication List        STOP taking these medications       acetaminophen 500 MG tablet Commonly known as: TYLENOL Replaced by: acetaminophen 160 MG/5ML solution   diclofenac Sodium 1 % Gel Commonly known as: VOLTAREN   nitroGLYCERIN 0.4 MG SL tablet Commonly known as: NITROSTAT   traMADol 50 MG tablet Commonly known as: ULTRAM             TAKE these medications       acetaminophen 160 MG/5ML solution Commonly known as: TYLENOL Take 15.6 mLs (500 mg total) by mouth every 6 (six) hours as needed for moderate pain or fever. Replaces: acetaminophen 500 MG tablet   enoxaparin 40 MG/0.4ML injection Commonly known as: LOVENOX Inject 0.4 mLs (40 mg total) into the skin daily for 7 days.   ferrous sulfate 325 (65 FE) MG tablet Take 1 tablet (325 mg total) by mouth daily with breakfast.   haloperidol 1 MG tablet Commonly known as: HALDOL Take 1 tablet (1 mg total) by mouth 3 (three) times daily.   hydrALAZINE 10 MG tablet Commonly known as: APRESOLINE Take 1 tablet (10 mg total) by mouth every 8 (eight) hours.   loperamide 2 MG capsule Commonly known as: IMODIUM Take 1 capsule (2 mg total) by mouth See admin instructions. Gives 1 capsule (2 mg) by mouth every 3 hours as needed for diarrhea. Do not exceed 8 doses in 24 hours   magnesium hydroxide 400 MG/5ML suspension Commonly known as: MILK OF MAGNESIA Take 5 mLs by mouth at bedtime as needed for mild constipation.   memantine 10 MG tablet Commonly known as: NAMENDA Take 1 tablet (10 mg total) by mouth 2 (two) times daily.   neomycin-bacitracin-polymyxin [phone] ointment Apply 1 application topically daily as needed (skin tear).                         Durable Medical Equipment  (From admission, onward)             Relevant Imaging Results:  Relevant Lab Results:   Additional Information SSN: [id number]  Angelita Ingles, RN

## 2020-03-08 NOTE — Progress Notes (Signed)
Pt seem to be in pain face grimacing no PRN, Palliative paged on [phone].

## 2020-03-14 DEATH — deceased
